# Patient Record
Sex: Female | Born: 1975 | Race: White | Hispanic: No | Smoking: Current some day smoker
Health system: Southern US, Community
[De-identification: ages and names within clinical notes are randomized; demographics above are authoritative.]

## PROBLEM LIST (undated history)

## (undated) ENCOUNTER — Emergency Department (HOSPITAL_COMMUNITY): Payer: Medicaid Other | Source: Home / Self Care

## (undated) ENCOUNTER — Ambulatory Visit: Discharge status: Absent without leave | Payer: Managed Care, Other (non HMO)

## (undated) DIAGNOSIS — E119 Type 2 diabetes mellitus without complications: Secondary | ICD-10-CM

## (undated) DIAGNOSIS — J45909 Unspecified asthma, uncomplicated: Secondary | ICD-10-CM

## (undated) DIAGNOSIS — G43909 Migraine, unspecified, not intractable, without status migrainosus: Secondary | ICD-10-CM

## (undated) DIAGNOSIS — I456 Pre-excitation syndrome: Secondary | ICD-10-CM

## (undated) HISTORY — PX: ABDOMINAL HYSTERECTOMY: SHX81

## (undated) HISTORY — PX: CARDIAC SURGERY: SHX584

---

## 2016-02-03 ENCOUNTER — Emergency Department (HOSPITAL_COMMUNITY)
Admission: EM | Admit: 2016-02-03 | Discharge: 2016-02-03 | Disposition: A | Discharge status: Home / Self Care | Payer: Self-pay | Attending: Emergency Medicine | Admitting: Emergency Medicine

## 2016-02-03 ENCOUNTER — Emergency Department (HOSPITAL_COMMUNITY)
Admission: EM | Admit: 2016-02-03 | Discharge: 2016-02-04 | Disposition: A | Discharge status: Home / Self Care | Payer: Self-pay | Attending: Emergency Medicine | Admitting: Emergency Medicine

## 2016-02-03 ENCOUNTER — Encounter (HOSPITAL_COMMUNITY): Payer: Self-pay | Admitting: Emergency Medicine

## 2016-02-03 ENCOUNTER — Encounter (HOSPITAL_COMMUNITY): Payer: Self-pay | Admitting: Family Medicine

## 2016-02-03 DIAGNOSIS — Z9889 Other specified postprocedural states: Secondary | ICD-10-CM | POA: Insufficient documentation

## 2016-02-03 DIAGNOSIS — Z8679 Personal history of other diseases of the circulatory system: Secondary | ICD-10-CM | POA: Insufficient documentation

## 2016-02-03 DIAGNOSIS — Z90710 Acquired absence of both cervix and uterus: Secondary | ICD-10-CM | POA: Insufficient documentation

## 2016-02-03 DIAGNOSIS — K625 Hemorrhage of anus and rectum: Secondary | ICD-10-CM | POA: Insufficient documentation

## 2016-02-03 DIAGNOSIS — R1013 Epigastric pain: Secondary | ICD-10-CM | POA: Insufficient documentation

## 2016-02-03 HISTORY — DX: Pre-excitation syndrome: I45.6

## 2016-02-03 LAB — COMPREHENSIVE METABOLIC PANEL
ALBUMIN: 4.4 g/dL (ref 3.5–5.0)
ALK PHOS: 71 U/L (ref 38–126)
ALT: 18 U/L (ref 14–54)
ANION GAP: 10 (ref 5–15)
AST: 20 U/L (ref 15–41)
BILIRUBIN TOTAL: 0.6 mg/dL (ref 0.3–1.2)
BUN: 16 mg/dL (ref 6–20)
CALCIUM: 9.7 mg/dL (ref 8.9–10.3)
CO2: 23 mmol/L (ref 22–32)
CREATININE: 0.94 mg/dL (ref 0.44–1.00)
Chloride: 107 mmol/L (ref 101–111)
GFR calc non Af Amer: >60 mL/min (ref >60)
GLUCOSE: 109 mg/dL — AB (ref 65–99)
Potassium: 4.1 mmol/L (ref 3.5–5.1)
Sodium: 140 mmol/L (ref 135–145)
TOTAL PROTEIN: 7.7 g/dL (ref 6.5–8.1)

## 2016-02-03 LAB — CBC
HCT: 38 % (ref 36.0–46.0)
HEMATOCRIT: 38.2 % (ref 36.0–46.0)
HEMOGLOBIN: 12.1 g/dL (ref 12.0–15.0)
Hemoglobin: 12.9 g/dL (ref 12.0–15.0)
MCH: 27.9 pg (ref 26.0–34.0)
MCH: 28.9 pg (ref 26.0–34.0)
MCHC: 31.8 g/dL (ref 30.0–36.0)
MCHC: 33.8 g/dL (ref 30.0–36.0)
MCV: 87.8 fL (ref 78.0–100.0)
MCV: 85.7 fL (ref 78.0–100.0)
PLATELETS: 355 10*3/uL (ref 150–400)
PLATELETS: 400 10*3/uL (ref 150–400)
RBC: 4.33 MIL/uL (ref 3.87–5.11)
RBC: 4.46 MIL/uL (ref 3.87–5.11)
RDW: 13.9 % (ref 11.5–15.5)
RDW: 13.9 % (ref 11.5–15.5)
WBC: 8.4 10*3/uL (ref 4.0–10.5)
WBC: 9.7 10*3/uL (ref 4.0–10.5)

## 2016-02-03 LAB — URINALYSIS, ROUTINE W REFLEX MICROSCOPIC
BILIRUBIN URINE: NEGATIVE
GLUCOSE, UA: NEGATIVE mg/dL
HGB URINE DIPSTICK: NEGATIVE
KETONES UR: NEGATIVE mg/dL
Leukocytes, UA: NEGATIVE
Nitrite: NEGATIVE
PROTEIN: NEGATIVE mg/dL
Specific Gravity, Urine: 1.023 (ref 1.005–1.030)
pH: 5 (ref 5.0–8.0)

## 2016-02-03 LAB — POC OCCULT BLOOD, ED: FECAL OCCULT BLD: NEGATIVE

## 2016-02-03 LAB — LIPASE, BLOOD: LIPASE: 30 U/L (ref 11–51)

## 2016-02-03 MED ORDER — MORPHINE SULFATE (PF) 4 MG/ML IV SOLN
4.0000 mg | Freq: Once | INTRAVENOUS | Status: AC
  Administered 2016-02-03: 4 mg via INTRAVENOUS
  Filled 2016-02-03: qty 1

## 2016-02-03 MED ORDER — PANTOPRAZOLE SODIUM 40 MG IV SOLR
40.0000 mg | Freq: Once | INTRAVENOUS | Status: AC
  Administered 2016-02-03: 40 mg via INTRAVENOUS
  Filled 2016-02-03: qty 40

## 2016-02-03 MED ORDER — GI COCKTAIL ~~LOC~~
30.0000 mL | Freq: Once | ORAL | Status: AC
  Administered 2016-02-03: 30 mL via ORAL
  Filled 2016-02-03: qty 30

## 2016-02-03 MED ORDER — HYDROCORTISONE 2.5 % RE CREA: TOPICAL_CREAM | RECTAL | Status: DC

## 2016-02-03 MED ORDER — SODIUM CHLORIDE 0.9 % IV BOLUS (SEPSIS)
1000.0000 mL | Freq: Once | INTRAVENOUS | Status: AC
  Administered 2016-02-03: 1000 mL via INTRAVENOUS

## 2016-02-03 NOTE — Discharge Instructions (Signed)
Michelle Gibson,  Nice meeting you! Please follow-up with gastroenterology. Return to the emergency department if you develop increased abdominal pain, rectal bleeding, dizziness, chest pain, shortness of breath, feel weak, or have new/worsening symptoms. Feel better soon!  S. Wendie Simmer, PA-C Gastrointestinal Bleeding Gastrointestinal (GI) bleeding means there is bleeding somewhere along the digestive tract, between the mouth and anus. CAUSES  There are many different problems that can cause GI bleeding. Possible causes include:  Esophagitis. This is inflammation, irritation, or swelling of the esophagus.  Hemorrhoids.These are veins that are full of blood (engorged) in the rectum. They cause pain, inflammation, and may bleed.  Anal fissures.These are areas of painful tearing which may bleed. They are often caused by passing hard stool.  Diverticulosis.These are pouches that form on the colon over time, with age, and may bleed significantly.  Diverticulitis.This is inflammation in areas with diverticulosis. It can cause pain, fever, and bloody stools, although bleeding is rare.  Polyps and cancer. Colon cancer often starts out as precancerous polyps.  Gastritis and ulcers.Bleeding from the upper gastrointestinal tract (near the stomach) may travel through the intestines and produce black, sometimes tarry, often bad smelling stools. In certain cases, if the bleeding is fast enough, the stools may not be black, but red. This condition may be life-threatening. SYMPTOMS   Vomiting bright red blood or material that looks like coffee grounds.  Bloody, black, or tarry stools. DIAGNOSIS  Your caregiver may diagnose your condition by taking your history and performing a physical exam. More tests may be needed, including:  X-rays and other imaging tests.  Esophagogastroduodenoscopy (EGD). This test uses a flexible, lighted tube to look at your esophagus, stomach, and small  intestine.  Colonoscopy. This test uses a flexible, lighted tube to look at your colon. TREATMENT  Treatment depends on the cause of your bleeding.   For bleeding from the esophagus, stomach, small intestine, or colon, the caregiver doing your EGD or colonoscopy may be able to stop the bleeding as part of the procedure.  Inflammation or infection of the colon can be treated with medicines.  Many rectal problems can be treated with creams, suppositories, or warm baths.  Surgery is sometimes needed.  Blood transfusions are sometimes needed if you have lost a lot of blood. If bleeding is slow, you may be allowed to go home. If there is a lot of bleeding, you will need to stay in the hospital for observation. HOME CARE INSTRUCTIONS   Take any medicines exactly as prescribed.  Keep your stools soft by eating foods that are high in fiber. These foods include whole grains, legumes, fruits, and vegetables. Prunes (1 to 3 a day) work well for many people.  Drink enough fluids to keep your urine clear or pale yellow. SEEK IMMEDIATE MEDICAL CARE IF:   Your bleeding increases.  You feel lightheaded, weak, or you faint.  You have severe cramps in your back or abdomen.  You pass large blood clots in your stool.  Your problems are getting worse. MAKE SURE YOU:   Understand these instructions.  Will watch your condition.  Will get help right away if you are not doing well or get worse.   This information is not intended to replace advice given to you by your health care provider. Make sure you discuss any questions you have with your health care provider.   Document Released: 09/24/2000 Document Revised: 09/13/2012 Document Reviewed: 03/17/2015 Elsevier Interactive Patient Education [employer].

## 2016-02-03 NOTE — ED Notes (Signed)
Pt here for LUQ pain and rectal bleeding that started today. Denies N,V.

## 2016-02-03 NOTE — ED Provider Notes (Signed)
CSN: [id number]     Arrival date & time 02/03/16  1556 History   First MD Initiated Contact with Patient 02/03/16 2025     Chief Complaint  Patient presents with  . Rectal Bleeding   HPI   Michelle Gibson is a 40 y.o. female PMH significant for WPW presenting with one episode of hematochezia and abdominal pain. She describes her hematochezia as BRB, light brown stool, no clots. She endorses epigastric pain which she describes as 10 out of 10 pain scale, nonradiating, constant, not associated with bowel movements or eating habits. She denies fevers, chills, CP, SOB, weakness, headaches, dizziness, N/V.   Past Medical History  Diagnosis Date  . WPW (Wolff-Parkinson-White syndrome)    Past Surgical History  Procedure Laterality Date  . Abdominal hysterectomy    . Cesarean section    . Cardiac surgery     No family history on file. Social History  Substance Use Topics  . Smoking status: Never Smoker   . Smokeless tobacco: None  . Alcohol Use: No   OB History    No data available     Review of Systems  Ten systems are reviewed and are negative for acute change except as noted in the HPI  Allergies  Ciprofloxacin  Home Medications   Prior to Admission medications   Medication Sig Start Date End Date Taking? Authorizing Provider  naproxen (NAPROSYN) 250 MG tablet Take 250 mg by mouth daily as needed for moderate pain.   Yes Historical Provider, MD   BP 126/86 mmHg  Pulse 80  Temp(Src) 98.7 F (37.1 C) (Oral)  Resp 18  SpO2 98% Physical Exam  Constitutional: She appears well-developed and well-nourished. No distress.  HENT:  Head: Normocephalic and atraumatic.  Mouth/Throat: Oropharynx is clear and moist. No oropharyngeal exudate.  Eyes: Conjunctivae are normal. Pupils are equal, round, and reactive to light. Right eye exhibits no discharge. Left eye exhibits no discharge. No scleral icterus.  Neck: No tracheal deviation present.  Cardiovascular: Normal rate, regular  rhythm, normal heart sounds and intact distal pulses.  Exam reveals no gallop and no friction rub.   No murmur heard. Pulmonary/Chest: Effort normal and breath sounds normal. No respiratory distress. She has no wheezes. She has no rales. She exhibits no tenderness.  Abdominal: Soft. Bowel sounds are normal. She exhibits no distension and no mass. There is tenderness. There is no rebound and no guarding.  Minimal, distractible epigastric tenderness  Genitourinary:  Chaperoned digital rectal exam: negative without frank blood, mass, lesions or tenderness.   Musculoskeletal: She exhibits no edema.  Lymphadenopathy:    She has no cervical adenopathy.  Neurological: She is alert. Coordination normal.  Skin: Skin is warm and dry. No rash noted. She is not diaphoretic. No erythema.  Psychiatric: She has a normal mood and affect. Her behavior is normal.  Nursing note and vitals reviewed.   ED Course  Procedures  Labs Review Labs Reviewed  CBC  POC OCCULT BLOOD, ED   MDM   Final diagnoses:  Rectal bleeding  Epigastric pain   Patient non-toxic appearing and VSS. One episode of hematochezia today. Negative rectal and hemoccult. Based on patient history and physical exam, most likely etiologies are GERD. Less likely etiologies include PUD, pancreatitis, MI, diverticulitis.  Hgb, which was stable earlier today, has even improved.   Medications  sodium chloride 0.9 % bolus 1,000 mL (0 mLs Intravenous Stopped 02/03/16 2232)  morphine 4 MG/ML injection 4 mg (4 mg Intravenous Given 02/03/16  2232)  pantoprazole (PROTONIX) injection 40 mg (40 mg Intravenous Given 02/03/16 2232)  gi cocktail (Maalox,Lidocaine,Donnatal) (30 mLs Oral Given 02/03/16 2308)   Patient feels improved after observation and/or treatment in ED.  Patient may be safely discharged home with anusol.  Discussed reasons for return. Patient to follow-up with primary care provider within one week. Patient in understanding and  agreement with the plan.  Rogers Lions, PA-C 02/09/16 [id number]  Wandra Arthurs, MD 02/09/16 1254

## 2016-02-03 NOTE — ED Notes (Signed)
CBC collected by Sheffield Slider RN not Livia Snellen RN as previously charted in error.

## 2016-02-03 NOTE — ED Notes (Signed)
Pt has urine sample at triage nursing station.

## 2016-02-03 NOTE — ED Notes (Signed)
Pt advised secretary she was leaving.  Seen walking out.

## 2016-02-03 NOTE — ED Notes (Addendum)
Pt reports bright red rectal bleed started today, sts noticed bright red blood while passing BM , sts normal color stools. Reports blood was even through her pants. Reports LUQ pain . denies nausea nor emesis. denies vaginal bleeding and urinary symptoms yet reports  Hx of IC .   Pt was at [facility] , left after triage, blood and urine tests were done per pt.

## 2016-02-03 NOTE — ED Notes (Signed)
I ATTEMPTED TWICE TO COLLECT BLOOD AND WAS UNSUCCESSFUL

## 2016-02-03 NOTE — ED Notes (Signed)
Patient stated she was leaving. 

## 2016-02-03 NOTE — ED Notes (Signed)
Unsuccessful attempt to draw blood. Nurse informed.

## 2016-02-03 NOTE — ED Notes (Signed)
Pt able to ambulate to the restroom without assistance.

## 2016-09-10 ENCOUNTER — Encounter (HOSPITAL_COMMUNITY): Payer: Self-pay | Admitting: Emergency Medicine

## 2016-09-10 ENCOUNTER — Emergency Department (HOSPITAL_COMMUNITY): Payer: Self-pay

## 2016-09-10 ENCOUNTER — Inpatient Hospital Stay (HOSPITAL_COMMUNITY)
Admission: EM | Admit: 2016-09-10 | Discharge: 2016-09-19 | DRG: 202 | Disposition: A | Discharge status: Home / Self Care | Payer: Self-pay | Attending: Internal Medicine | Admitting: Internal Medicine

## 2016-09-10 DIAGNOSIS — R0982 Postnasal drip: Secondary | ICD-10-CM | POA: Diagnosis present

## 2016-09-10 DIAGNOSIS — B974 Respiratory syncytial virus as the cause of diseases classified elsewhere: Secondary | ICD-10-CM | POA: Diagnosis present

## 2016-09-10 DIAGNOSIS — K219 Gastro-esophageal reflux disease without esophagitis: Secondary | ICD-10-CM | POA: Diagnosis present

## 2016-09-10 DIAGNOSIS — Z8679 Personal history of other diseases of the circulatory system: Secondary | ICD-10-CM

## 2016-09-10 DIAGNOSIS — R739 Hyperglycemia, unspecified: Secondary | ICD-10-CM | POA: Diagnosis present

## 2016-09-10 DIAGNOSIS — Z23 Encounter for immunization: Secondary | ICD-10-CM

## 2016-09-10 DIAGNOSIS — R0600 Dyspnea, unspecified: Secondary | ICD-10-CM

## 2016-09-10 DIAGNOSIS — J40 Bronchitis, not specified as acute or chronic: Secondary | ICD-10-CM

## 2016-09-10 DIAGNOSIS — J45901 Unspecified asthma with (acute) exacerbation: Principal | ICD-10-CM | POA: Diagnosis present

## 2016-09-10 DIAGNOSIS — B9789 Other viral agents as the cause of diseases classified elsewhere: Secondary | ICD-10-CM | POA: Diagnosis present

## 2016-09-10 DIAGNOSIS — T380X5A Adverse effect of glucocorticoids and synthetic analogues, initial encounter: Secondary | ICD-10-CM | POA: Diagnosis present

## 2016-09-10 DIAGNOSIS — J9801 Acute bronchospasm: Secondary | ICD-10-CM | POA: Diagnosis present

## 2016-09-10 DIAGNOSIS — J9601 Acute respiratory failure with hypoxia: Secondary | ICD-10-CM | POA: Diagnosis present

## 2016-09-10 HISTORY — DX: Unspecified asthma, uncomplicated: J45.909

## 2016-09-10 MED ORDER — IPRATROPIUM-ALBUTEROL 0.5-2.5 (3) MG/3ML IN SOLN
3.0000 mL | Freq: Once | RESPIRATORY_TRACT | Status: AC
  Administered 2016-09-10: 3 mL via RESPIRATORY_TRACT

## 2016-09-10 MED ORDER — HYDROCOD POLST-CPM POLST ER 10-8 MG/5ML PO SUER
ORAL | Status: AC
  Filled 2016-09-10: qty 5

## 2016-09-10 MED ORDER — HYDROCOD POLST-CPM POLST ER 10-8 MG/5ML PO SUER
5.0000 mL | Freq: Once | ORAL | Status: AC
  Administered 2016-09-10: 5 mL via ORAL

## 2016-09-10 MED ORDER — ALBUTEROL SULFATE HFA 108 (90 BASE) MCG/ACT IN AERS
INHALATION_SPRAY | RESPIRATORY_TRACT | Status: AC
  Filled 2016-09-10: qty 6.7

## 2016-09-10 MED ORDER — PREDNISONE 20 MG PO TABS
ORAL_TABLET | ORAL | Status: AC
  Filled 2016-09-10: qty 4

## 2016-09-10 MED ORDER — PREDNISONE 20 MG PO TABS
60.0000 mg | ORAL_TABLET | Freq: Once | ORAL | Status: AC
  Administered 2016-09-10: 60 mg via ORAL

## 2016-09-10 MED ORDER — ALBUTEROL SULFATE (2.5 MG/3ML) 0.083% IN NEBU
INHALATION_SOLUTION | RESPIRATORY_TRACT | Status: AC
  Filled 2016-09-10: qty 6

## 2016-09-10 MED ORDER — IPRATROPIUM-ALBUTEROL 0.5-2.5 (3) MG/3ML IN SOLN
RESPIRATORY_TRACT | Status: AC
  Filled 2016-09-10: qty 3

## 2016-09-10 MED ORDER — ALBUTEROL SULFATE HFA 108 (90 BASE) MCG/ACT IN AERS
2.0000 | INHALATION_SPRAY | RESPIRATORY_TRACT | Status: DC | PRN
  Administered 2016-09-10: 2 via RESPIRATORY_TRACT

## 2016-09-10 MED ORDER — ALBUTEROL SULFATE (2.5 MG/3ML) 0.083% IN NEBU
5.0000 mg | INHALATION_SOLUTION | Freq: Once | RESPIRATORY_TRACT | Status: AC
  Administered 2016-09-10: 5 mg via RESPIRATORY_TRACT

## 2016-09-10 NOTE — ED Provider Notes (Signed)
[facility] DEPT Provider Note   CSN: [id number] Arrival date & time: 09/10/16  2134  By signing my name below, I, Hansel Feinstein, attest that this documentation has been prepared under the direction and in the presence of  [facility] M. Janit Bern, NP. Electronically Signed: Hansel Feinstein, ED Scribe. 09/10/16. 10:35 PM.    History   Chief Complaint Chief Complaint  Patient presents with  . Cough    HPI Michelle Gibson is a 40 y.o. female who presents to the Emergency Department complaining of intermittent, worsening dry cough onset 3 days ago with associated wheezing, SOB, generalized fatigue. Pt states she has been taking Dayquil with no relief of symptoms. No worsening factors noted. Pt has h/o asthma as a child and h/o bronchitis, noting that these symptoms are similar. Pt states she has seen relief of symptoms in the ED after receiving a breathing treatment. She denies fever.   The history is provided by the patient. No language interpreter was used.  Cough  This is a new problem. The current episode started more than 2 days ago. The problem occurs every few minutes. The problem has been gradually worsening. The cough is non-productive. There has been no fever. Associated symptoms include shortness of breath and wheezing. She has tried decongestants for the symptoms. The treatment provided no relief. She is not a smoker. Her past medical history is significant for bronchitis and asthma.    Past Medical History:  Diagnosis Date  . WPW (Wolff-Parkinson-White syndrome)     There are no active problems to display for this patient.   Past Surgical History:  Procedure Laterality Date  . ABDOMINAL HYSTERECTOMY    . CARDIAC SURGERY    . CESAREAN SECTION      OB History    No data available       Home Medications    Prior to Admission medications   Medication Sig Start Date End Date Taking? Authorizing Provider  azithromycin (ZITHROMAX) 250 MG tablet Take 1 tablet (250 mg total) by mouth  daily. Take first 2 tablets together, then 1 every day until finished. 09/11/16   Guiseppe Flanagan Bunnie Pion, NP  guaiFENesin-codeine (ROBITUSSIN AC) 100-10 MG/5ML syrup Take 5 mLs by mouth 3 (three) times daily as needed for cough. 09/11/16   Caira Poche Bunnie Pion, NP  hydrocortisone (ANUSOL-HC) 2.5 % rectal cream Apply rectally 2 times daily 02/03/16   [place] Lions, PA-C  naproxen (NAPROSYN) 250 MG tablet Take 250 mg by mouth daily as needed for moderate pain.    Historical Provider, MD  predniSONE (DELTASONE) 10 MG tablet Take 2 tablets (20 mg total) by mouth 2 (two) times daily with a meal. 09/11/16   Dorothy Landgrebe Bunnie Pion, NP    Family History No family history on file.  Social History Social History  Substance Use Topics  . Smoking status: Never Smoker  . Smokeless tobacco: Not on file  . Alcohol use No     Allergies   Ciprofloxacin   Review of Systems Review of Systems  Constitutional: Positive for fatigue.  Respiratory: Positive for cough, shortness of breath and wheezing.   All other systems reviewed and are negative.    Physical Exam Updated Vital Signs BP 142/80 (BP Location: Left Arm)   Pulse (S) (!) 135 Comment: while ambulating  Temp 98.3 F (36.8 C) (Oral)   Resp 18   SpO2 (S) (!) 88% Comment: While ambulating  Physical Exam  Constitutional: She appears well-developed and well-nourished.  HENT:  Head: Normocephalic.  Uvula midline. Mild erythema. No edema. TMs normal bilaterally.   Eyes: Conjunctivae are normal.  Neck: Normal range of motion. Neck supple.  Cardiovascular: Normal rate and regular rhythm.   Pulmonary/Chest: Effort normal. No respiratory distress. She has wheezes.  Inspiratory and expiratory wheezes. Positive rhonchi.   Abdominal: Soft. She exhibits no distension. There is no tenderness.  Musculoskeletal: Normal range of motion.  Lymphadenopathy:    She has no cervical adenopathy.  Neurological: She is alert.  Skin: Skin is warm and dry.  Psychiatric: She has  a normal mood and affect. Her behavior is normal.  Nursing note and vitals reviewed.    ED Treatments / Results   DIAGNOSTIC STUDIES: COORDINATION OF CARE: 10:33 PM Discussed treatment plan with pt at bedside which includes CXR, breathing tx and pt agreed to plan.    Labs (all labs ordered are listed, but only abnormal results are displayed) Labs Reviewed  CBC WITH DIFFERENTIAL/PLATELET - Abnormal; Notable for the following:       Result Value   Neutro Abs 8.9 (*)    All other components within normal limits  BASIC METABOLIC PANEL - Abnormal; Notable for the following:    Potassium 3.2 (*)    CO2 21 (*)    Glucose, Bld 166 (*)    Creatinine, Ser 1.14 (*)    All other components within normal limits  D-DIMER, QUANTITATIVE (NOT AT [facility])    Radiology Dg Chest 2 View  Result Date: 09/10/2016 CLINICAL DATA:  Acute onset of wheezing and scratchy throat. Shortness of breath and generalized chest discomfort. Initial encounter. EXAM: CHEST  2 VIEW COMPARISON:  None. FINDINGS: The lungs are well-aerated and clear. There is no evidence of focal opacification, pleural effusion or pneumothorax. The heart is normal in size; the mediastinal contour is within normal limits. The patient is status post median sternotomy. Clips are noted within the right upper quadrant, reflecting prior cholecystectomy. No acute osseous abnormalities are seen. IMPRESSION: No acute cardiopulmonary process seen. Electronically Signed   By: Garald Balding M.D.   On: 09/10/2016 22:27    Procedures Procedures (including critical care time)  Medications Ordered in ED Medications  albuterol (PROVENTIL HFA;VENTOLIN HFA) 108 (90 Base) MCG/ACT inhaler 2 puff (2 puffs Inhalation Given 09/10/16 2332)  albuterol (PROVENTIL) (2.5 MG/3ML) 0.083% nebulizer solution 5 mg (5 mg Nebulization Given 09/10/16 2150)  ipratropium-albuterol (DUONEB) 0.5-2.5 (3) MG/3ML nebulizer solution 3 mL (3 mLs Nebulization Given 09/10/16 2251)    predniSONE (DELTASONE) tablet 60 mg (60 mg Oral Given 09/10/16 2251)  chlorpheniramine-HYDROcodone (TUSSIONEX) 10-8 MG/5ML suspension 5 mL (5 mLs Oral Given 09/10/16 2332)  ipratropium-albuterol (DUONEB) 0.5-2.5 (3) MG/3ML nebulizer solution 3 mL (3 mLs Nebulization Given 09/11/16 0009)     Initial Impression / Assessment and Plan / ED Course  I have reviewed the triage vital signs and the nursing notes.  Pertinent imaging results that were available during my care of the patient were reviewed by me and considered in my medical decision making (see chart for details).  Clinical Course      Lung exam improved after nebulizer treatment but continues to feel short of breath Prednisone given in the ED, Tussionex 5 ml PO given.  Second neb treatment given and patient continues to have diminished breath sounds but only occasional wheezing. When ambulating the patient O2 sat drops below 90.  Third neb treatment given and patient ambulated after.  O2 SAT continued to drop below 90 and patient is tachycardic.   Care turned  over to Charlann Lange, [facility] @ 2:15am If patient remains tachycardic and O2 SAT drops below 90 with ambulation will plan to admit.   Final Clinical Impressions(s) / ED Diagnoses   I personally performed the services described in this documentation, which was scribed in my presence. The recorded information has been reviewed and is accurate.    [address] [place], NP 09/11/16 [id number]    Gareth Morgan, MD 09/11/16 [phone]

## 2016-09-10 NOTE — ED Triage Notes (Signed)
Pt c/o scratchy throat and cough, wheezing, fatigue. Pt lung sounds are wheezing throughout. Pt taking dayquil without relief. Hx of asthma as a child.

## 2016-09-11 ENCOUNTER — Emergency Department (HOSPITAL_COMMUNITY): Payer: Self-pay

## 2016-09-11 ENCOUNTER — Encounter (HOSPITAL_COMMUNITY): Payer: Self-pay | Admitting: Physician Assistant

## 2016-09-11 DIAGNOSIS — J4521 Mild intermittent asthma with (acute) exacerbation: Secondary | ICD-10-CM

## 2016-09-11 DIAGNOSIS — J9801 Acute bronchospasm: Secondary | ICD-10-CM | POA: Diagnosis present

## 2016-09-11 LAB — BASIC METABOLIC PANEL
ANION GAP: 14 (ref 5–15)
BUN: 17 mg/dL (ref 6–20)
CO2: 21 mmol/L — ABNORMAL LOW (ref 22–32)
Calcium: 9.5 mg/dL (ref 8.9–10.3)
Chloride: 104 mmol/L (ref 101–111)
Creatinine, Ser: 1.14 mg/dL — ABNORMAL HIGH (ref 0.44–1.00)
GLUCOSE: 166 mg/dL — AB (ref 65–99)
POTASSIUM: 3.2 mmol/L — AB (ref 3.5–5.1)
Sodium: 139 mmol/L (ref 135–145)

## 2016-09-11 LAB — CBC WITH DIFFERENTIAL/PLATELET
BASOS ABS: 0 10*3/uL (ref 0.0–0.1)
BASOS PCT: 0 %
EOS PCT: 1 %
Eosinophils Absolute: 0.1 10*3/uL (ref 0.0–0.7)
HEMATOCRIT: 42.9 % (ref 36.0–46.0)
Hemoglobin: 14.5 g/dL (ref 12.0–15.0)
LYMPHS PCT: 10 %
Lymphs Abs: 1.1 10*3/uL (ref 0.7–4.0)
MCH: 30.5 pg (ref 26.0–34.0)
MCHC: 33.8 g/dL (ref 30.0–36.0)
MCV: 90.3 fL (ref 78.0–100.0)
Monocytes Absolute: 0.3 10*3/uL (ref 0.1–1.0)
Monocytes Relative: 3 %
NEUTROS ABS: 8.9 10*3/uL — AB (ref 1.7–7.7)
Neutrophils Relative %: 86 %
PLATELETS: 360 10*3/uL (ref 150–400)
RBC: 4.75 MIL/uL (ref 3.87–5.11)
RDW: 13.9 % (ref 11.5–15.5)
WBC: 10.4 10*3/uL (ref 4.0–10.5)

## 2016-09-11 LAB — D-DIMER, QUANTITATIVE (NOT AT ARMC)

## 2016-09-11 LAB — TROPONIN I

## 2016-09-11 MED ORDER — HYDROCODONE-ACETAMINOPHEN 5-325 MG PO TABS
1.0000 | ORAL_TABLET | ORAL | Status: DC | PRN
  Administered 2016-09-11 – 2016-09-12 (×7): 2 via ORAL
  Filled 2016-09-11 (×7): qty 2

## 2016-09-11 MED ORDER — AZITHROMYCIN 250 MG PO TABS: 250.0000 mg | ORAL_TABLET | Freq: Every day | ORAL | 0 refills | Status: DC

## 2016-09-11 MED ORDER — IPRATROPIUM-ALBUTEROL 0.5-2.5 (3) MG/3ML IN SOLN
3.0000 mL | RESPIRATORY_TRACT | Status: DC
  Administered 2016-09-11 – 2016-09-12 (×7): 3 mL via RESPIRATORY_TRACT
  Filled 2016-09-11 (×6): qty 3

## 2016-09-11 MED ORDER — GUAIFENESIN-CODEINE 100-10 MG/5ML PO SYRP: 5.0000 mL | ORAL_SOLUTION | Freq: Three times a day (TID) | ORAL | 0 refills | Status: DC | PRN

## 2016-09-11 MED ORDER — IPRATROPIUM-ALBUTEROL 0.5-2.5 (3) MG/3ML IN SOLN
3.0000 mL | Freq: Once | RESPIRATORY_TRACT | Status: AC
  Administered 2016-09-11: 3 mL via RESPIRATORY_TRACT

## 2016-09-11 MED ORDER — IPRATROPIUM-ALBUTEROL 0.5-2.5 (3) MG/3ML IN SOLN
RESPIRATORY_TRACT | Status: AC
  Filled 2016-09-11: qty 3

## 2016-09-11 MED ORDER — SODIUM CHLORIDE 0.9% FLUSH
3.0000 mL | Freq: Two times a day (BID) | INTRAVENOUS | Status: DC
  Administered 2016-09-11 – 2016-09-18 (×16): 3 mL via INTRAVENOUS

## 2016-09-11 MED ORDER — GUAIFENESIN ER 600 MG PO TB12
600.0000 mg | ORAL_TABLET | Freq: Two times a day (BID) | ORAL | Status: DC
  Administered 2016-09-11 – 2016-09-12 (×3): 600 mg via ORAL
  Filled 2016-09-11 (×4): qty 1

## 2016-09-11 MED ORDER — MAGNESIUM CITRATE PO SOLN: 1.0000 | Freq: Once | ORAL | Status: DC | PRN

## 2016-09-11 MED ORDER — ACETAMINOPHEN 325 MG PO TABS
650.0000 mg | ORAL_TABLET | Freq: Four times a day (QID) | ORAL | Status: DC | PRN
  Administered 2016-09-11 – 2016-09-15 (×3): 650 mg via ORAL
  Filled 2016-09-11 (×2): qty 2

## 2016-09-11 MED ORDER — ALBUTEROL SULFATE (2.5 MG/3ML) 0.083% IN NEBU
5.0000 mg | INHALATION_SOLUTION | RESPIRATORY_TRACT | Status: DC | PRN
  Administered 2016-09-13 – 2016-09-15 (×3): 5 mg via RESPIRATORY_TRACT
  Filled 2016-09-11 (×3): qty 6

## 2016-09-11 MED ORDER — ENOXAPARIN SODIUM 40 MG/0.4ML ~~LOC~~ SOLN
40.0000 mg | SUBCUTANEOUS | Status: DC
  Administered 2016-09-11 – 2016-09-18 (×8): 40 mg via SUBCUTANEOUS
  Filled 2016-09-11 (×8): qty 0.4

## 2016-09-11 MED ORDER — SENNOSIDES-DOCUSATE SODIUM 8.6-50 MG PO TABS
1.0000 | ORAL_TABLET | Freq: Every evening | ORAL | Status: DC | PRN
  Filled 2016-09-11: qty 1

## 2016-09-11 MED ORDER — IOPAMIDOL (ISOVUE-370) INJECTION 76%
INTRAVENOUS | Status: AC
  Administered 2016-09-11: 100 mL
  Filled 2016-09-11: qty 100

## 2016-09-11 MED ORDER — ALBUTEROL (5 MG/ML) CONTINUOUS INHALATION SOLN
10.0000 mg/h | INHALATION_SOLUTION | Freq: Once | RESPIRATORY_TRACT | Status: AC
  Administered 2016-09-11: 10 mg/h via RESPIRATORY_TRACT

## 2016-09-11 MED ORDER — TRAZODONE HCL 50 MG PO TABS
25.0000 mg | ORAL_TABLET | Freq: Every evening | ORAL | Status: DC | PRN
  Filled 2016-09-11: qty 1

## 2016-09-11 MED ORDER — ACETAMINOPHEN 325 MG PO TABS
ORAL_TABLET | ORAL | Status: AC
  Filled 2016-09-11: qty 2

## 2016-09-11 MED ORDER — INFLUENZA VAC SPLIT QUAD 0.5 ML IM SUSY
0.5000 mL | PREFILLED_SYRINGE | INTRAMUSCULAR | Status: DC
  Filled 2016-09-11: qty 0.5

## 2016-09-11 MED ORDER — METHYLPREDNISOLONE SODIUM SUCC 125 MG IJ SOLR
INTRAMUSCULAR | Status: AC
  Filled 2016-09-11: qty 2

## 2016-09-11 MED ORDER — BISACODYL 10 MG RE SUPP: 10.0000 mg | Freq: Every day | RECTAL | Status: DC | PRN

## 2016-09-11 MED ORDER — PREDNISONE 10 MG PO TABS: 20.0000 mg | ORAL_TABLET | Freq: Two times a day (BID) | ORAL | 0 refills | Status: DC

## 2016-09-11 MED ORDER — METHYLPREDNISOLONE SODIUM SUCC 125 MG IJ SOLR
60.0000 mg | Freq: Four times a day (QID) | INTRAMUSCULAR | Status: DC
  Administered 2016-09-11 – 2016-09-15 (×17): 60 mg via INTRAVENOUS
  Filled 2016-09-11 (×16): qty 2

## 2016-09-11 MED ORDER — ONDANSETRON HCL 4 MG PO TABS
4.0000 mg | ORAL_TABLET | Freq: Four times a day (QID) | ORAL | Status: DC | PRN
  Administered 2016-09-12: 4 mg via ORAL
  Filled 2016-09-11: qty 1

## 2016-09-11 MED ORDER — ACETAMINOPHEN 650 MG RE SUPP: 650.0000 mg | Freq: Four times a day (QID) | RECTAL | Status: DC | PRN

## 2016-09-11 MED ORDER — ONDANSETRON HCL 4 MG/2ML IJ SOLN
4.0000 mg | Freq: Four times a day (QID) | INTRAMUSCULAR | Status: DC | PRN
  Administered 2016-09-12: 4 mg via INTRAVENOUS
  Filled 2016-09-11: qty 2

## 2016-09-11 MED ORDER — IPRATROPIUM BROMIDE 0.02 % IN SOLN
0.5000 mg | Freq: Once | RESPIRATORY_TRACT | Status: AC
  Administered 2016-09-11: 0.5 mg via RESPIRATORY_TRACT

## 2016-09-11 NOTE — H&P (Signed)
History and Physical    Michelle Gibson [id number] DOB: [birth year] DOA: 09/10/2016   PCP: No PCP Per Patient   Patient coming from:  Home    Chief Complaint: Asthma exacerbation   HPI: Michelle Gibson is a 40 y.o. female with medical history significant for childhood asthma, Wolf Parkinson's white, presenting with 3 day history of worsening cough and wheezing without fever. She also reports generalized fatigue. The patient has been taking Dayquil will with no relief of symptoms. She reports that the symptoms of her asthma exacerbation are similar to prior. 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 She was satting in the 80s on room air on presentation, but now these above 90 in RA . Denies fever or chills, myalgias or night sweats. Denies any sick contacts. No recent long distance travel. She denies any new stressors. No nausea vomiting diarrhea, abdominal pain, or leg swelling. No bleeding issues are noted.  Test x-ray negative. CT angiogram negative for PE. NO tobacco or recreational drugs  She is being admitted for asthma exacerbation.  ED Course:  BP 116/78   Pulse 95   Temp 98.3 F (36.8 C) (Oral)   Resp 12   SpO2 95%    D dimer less than 0.27 CT negative for PE white count 10.4 received 3 nebulizer treatments with duoneb x3, albuterol x2, due now for new albuterol therapy, and received prednisone 60 mg, but continues to wheeze.   Review of Systems: As per HPI otherwise 10 point review of systems negative.   Past Medical History:  Diagnosis Date  . WPW (Wolff-Parkinson-White syndrome)     Past Surgical History:  Procedure Laterality Date  . ABDOMINAL HYSTERECTOMY    . CARDIAC SURGERY    . CESAREAN SECTION      Social History Social History   Social History  . Marital status: Divorced    Spouse name: N/A  . Number of children: N/A  . Years of education: N/A   Occupational History  . Not on file.     Social History Main Topics  . Smoking status: Never Smoker  . Smokeless tobacco: Not on file  . Alcohol use No  . Drug use: No  . Sexual activity: Not on file   Other Topics Concern  . Not on file   Social History Narrative  . No narrative on file     Allergies  Allergen Reactions  . Ciprofloxacin Hives    No family history on file.    Prior to Admission medications   Medication Sig Start Date End Date Taking? Authorizing Provider  hydrocortisone (ANUSOL-HC) 2.5 % rectal cream Apply rectally 2 times daily Patient not taking: Reported on 09/11/2016 02/03/16   Malvern Lions, PA-C    Physical Exam:    Vitals:   09/11/16 0245 09/11/16 0305 09/11/16 0340 09/11/16 0400  BP:  125/79 123/81 116/78  Pulse: (S) (!) 135 99 99 95  Resp:  18 15 12   Temp:      TempSrc:      SpO2: (S) (!) 88% 95% 94% 95%       Constitutional: NAD, calm, comfortable Vitals:   09/11/16 0245 09/11/16 0305 09/11/16 0340 09/11/16 0400  BP:  125/79 123/81 116/78  Pulse: (S) (!) 135 99 99 95  Resp:  18 15 12   Temp:      TempSrc:      SpO2: (S) (!) 88% 95% 94% 95%  Eyes: PERRL, lids and conjunctivae normal ENMT: Mucous membranes are moist. Posterior pharynx clear of any exudate or lesions.Normal dentition.  Neck: normal, supple, no masses, no thyromegaly Respiratory: clear to auscultation bilaterally, diffuse expiratory  wheezing, no crackles or rhonchi . Normal respiratory effort. No accessory muscle use.  Cardiovascular: Regular rate and rhythm, no murmurs / rubs / gallops. No extremity edema. 2+ pedal pulses. No carotid bruits.  Abdomen: no tenderness, no masses palpated. No hepatosplenomegaly. Bowel sounds positive.  Musculoskeletal: no clubbing / cyanosis. No joint deformity upper and lower extremities. Good ROM, no contractures. Normal muscle tone.  Skin: no rashes, lesions, ulcers.  Neurologic: CN 2-12 grossly intact. Sensation intact, DTR normal. Strength 5/5 in all 4.   Psychiatric: Normal judgment and insight. Alert and oriented x 3. Normal mood.     Labs on Admission: I have personally reviewed following labs and imaging studies  CBC:  Recent Labs Lab 09/11/16 0051  WBC 10.4  NEUTROABS 8.9*  HGB 14.5  HCT 42.9  MCV 90.3  PLT [id number]    Basic Metabolic Panel:  Recent Labs Lab 09/11/16 0051  NA 139  K 3.2*  CL 104  CO2 21*  GLUCOSE 166*  BUN 17  CREATININE 1.14*  CALCIUM 9.5    GFR: CrCl cannot be calculated (Unknown ideal weight.).  Liver Function Tests: No results for input(s): AST, ALT, ALKPHOS, BILITOT, PROT, ALBUMIN in the last 168 hours. No results for input(s): LIPASE, AMYLASE in the last 168 hours. No results for input(s): AMMONIA in the last 168 hours.  Coagulation Profile: No results for input(s): INR, PROTIME in the last 168 hours.  Cardiac Enzymes: No results for input(s): CKTOTAL, CKMB, CKMBINDEX, TROPONINI in the last 168 hours.  BNP (last 3 results) No results for input(s): PROBNP in the last 8760 hours.  HbA1C: No results for input(s): HGBA1C in the last 72 hours.  CBG: No results for input(s): GLUCAP in the last 168 hours.  Lipid Profile: No results for input(s): CHOL, HDL, LDLCALC, TRIG, CHOLHDL, LDLDIRECT in the last 72 hours.  Thyroid Function Tests: No results for input(s): TSH, T4TOTAL, FREET4, T3FREE, THYROIDAB in the last 72 hours.  Anemia Panel: No results for input(s): VITAMINB12, FOLATE, FERRITIN, TIBC, IRON, RETICCTPCT in the last 72 hours.  Urine analysis:    Component Value Date/Time   COLORURINE YELLOW 02/03/2016 1424   APPEARANCEUR CLEAR 02/03/2016 1424   LABSPEC 1.023 02/03/2016 1424   PHURINE 5.0 02/03/2016 1424   GLUCOSEU NEGATIVE 02/03/2016 1424   HGBUR NEGATIVE 02/03/2016 1424   BILIRUBINUR NEGATIVE 02/03/2016 1424   KETONESUR NEGATIVE 02/03/2016 1424   PROTEINUR NEGATIVE 02/03/2016 1424   NITRITE NEGATIVE 02/03/2016 1424   LEUKOCYTESUR NEGATIVE 02/03/2016 1424     Sepsis Labs: @LABRCNTIP (procalcitonin:4,lacticidven:4) )No results found for this or any previous visit (from the past 240 hour(s)).   Radiological Exams on Admission: Dg Chest 2 View  Result Date: 09/10/2016 CLINICAL DATA:  Acute onset of wheezing and scratchy throat. Shortness of breath and generalized chest discomfort. Initial encounter. EXAM: CHEST  2 VIEW COMPARISON:  None. FINDINGS: The lungs are well-aerated and clear. There is no evidence of focal opacification, pleural effusion or pneumothorax. The heart is normal in size; the mediastinal contour is within normal limits. The patient is status post median sternotomy. Clips are noted within the right upper quadrant, reflecting prior cholecystectomy. No acute osseous abnormalities are seen. IMPRESSION: No acute cardiopulmonary process seen. Electronically Signed   By: Garald Balding M.D.   On: 09/10/2016  22:27   Ct Angio Chest Pe W And/or Wo Contrast  Result Date: 09/11/2016 CLINICAL DATA:  Acute onset of dyspnea on exertion and hypoxia. Initial encounter. EXAM: CT ANGIOGRAPHY CHEST WITH CONTRAST TECHNIQUE: Multidetector CT imaging of the chest was performed using the standard protocol during bolus administration of intravenous contrast. Multiplanar CT image reconstructions and MIPs were obtained to evaluate the vascular anatomy. CONTRAST:  100 mL of Isovue 370 IV contrast COMPARISON:  Chest radiograph performed 09/10/2016 FINDINGS: Cardiovascular:  There is no evidence of pulmonary embolus. The heart is unremarkable in appearance. The thoracic aorta is unremarkable. No calcific atherosclerotic disease is seen. The great vessels are grossly unremarkable in appearance. Mediastinum/Nodes: The mediastinum is unremarkable in appearance. No mediastinal lymphadenopathy is seen. No pericardial effusion is identified. The patient is status post median sternotomy. The visualized portions of the thyroid gland are unremarkable. No axillary  lymphadenopathy is seen. Lungs/Pleura: Minimal bibasilar atelectasis is noted. The lungs are otherwise clear. No masses are identified. No pleural effusion or pneumothorax is seen. Upper Abdomen: The visualized portions of the liver and spleen are grossly unremarkable. Musculoskeletal: No acute osseous abnormalities are identified. The visualized musculature is unremarkable in appearance. Review of the MIP images confirms the above findings. IMPRESSION: 1. No evidence of pulmonary embolus. 2. Minimal bibasilar atelectasis noted.  Lungs otherwise clear. Electronically Signed   By: Garald Balding M.D.   On: 09/11/2016 04:52    EKG: Independently reviewed.  Assessment/Plan Active Problems:   Bronchospasm    Asthma exacerbation received 3 nebulizer treatments would do more nail and albuterol  then had 60 mg of prednisone with some improvement. CT is negative for PE. D dimerless than 0.27 . White count 10.4 - Admit to  tele Obs  - Observation - Duonebs / Albuterol nebs  - Steroids with solumedrol 60 mg IV q 6 h  Robitussin prn  - O2 - CBC in am Sputum cultures   History of WPW. No EKG or Tn to evaluate. Denies CP  Check EKG, Tn here o/w no intervention indicated at this time   DVT prophylaxis: Lovenox   Code Status:   Full     Family Communication:  Discussed with patient Disposition Plan: Expect patient to be discharged to home after condition improves Consults called:    None Admission status Obs tele  Rondel Jumbo, PA-C [facility]   09/11/2016, 6:48 AM

## 2016-09-11 NOTE — ED Notes (Signed)
Attempted to call report

## 2016-09-11 NOTE — ED Provider Notes (Signed)
Wheezing, cough for several days. Saturations with ambulation drop to below 90%. SOB with exertion.   She has had 3 nebulizer treatments with minimal improvements. Has had 60 mg prednisone.   Pending d-dimer. Will need re-evaluation for DOE and hypoxia.   Patient remains hypoxic when ambulating, SOB despite multiple Albuterol treatments. CT performed secondary to respiratory difficulty and is negative for PE. She will need admission for serial nebulizer treatments and improvement in symptoms.   Discussed with hospitalist who accepts the patient onto their service.    Charlann Lange, PA-C 09/17/16 TX:[id number]    Gareth Morgan, MD 09/17/16 1332

## 2016-09-11 NOTE — ED Notes (Signed)
Walked with patient on pulse ox.  When moving patient dropped to 88%.  NP made aware and to order breathing tx

## 2016-09-11 NOTE — ED Notes (Signed)
attempted report x1 

## 2016-09-11 NOTE — ED Notes (Signed)
Patient transported to CT 

## 2016-09-11 NOTE — ED Notes (Signed)
RN at bedside

## 2016-09-11 NOTE — ED Notes (Signed)
Ambulated Pt independently in hallway. Beginning O2 sat 96%. Desat to 89%. Pt denied dizziness. Tremor noted in upper extremities.

## 2016-09-11 NOTE — Progress Notes (Signed)
This is a no charge note   Pending admission per PA, Upstill  40 year old lady with past medical history of childhood asthma, WPW, who presents with cough, wheezing without fever. Chest x-ray negative. CT angiogram negative for PE. Clinically has asthma exacerbation. Pt is accepted to tele for obs.  Ivor Costa, MD  [facility] Pager [phone]  If 7PM-7AM, please contact night-coverage [email or web address] Password [email or web address] 09/11/2016, 6:30 AM

## 2016-09-11 NOTE — ED Notes (Signed)
Patient moved over from [place] to [facility].  Patient explained reason for moving and the events to follow and verbalized understanding.  Next nurse given report of patient and unable to keep O2 saturation above 90% while ambulating.  Patient stated that she still feels the chest tightness with breathing, RN and PA aware.

## 2016-09-11 NOTE — ED Notes (Signed)
Attempted to call report. ED Charge nurse aware.

## 2016-09-11 NOTE — Progress Notes (Signed)
Pt c/o lungs being "dry and burning."  She also stated that the "continuous" respiratory treatment that she received in ED helped her more than the duo neb trts that she has been receiving here on the unit.  MD notified.  No orders given.

## 2016-09-12 DIAGNOSIS — J9801 Acute bronchospasm: Secondary | ICD-10-CM

## 2016-09-12 LAB — GLUCOSE, CAPILLARY
GLUCOSE-CAPILLARY: 260 mg/dL — AB (ref 65–99)
Glucose-Capillary: 180 mg/dL — ABNORMAL HIGH (ref 65–99)

## 2016-09-12 LAB — RESPIRATORY PANEL BY PCR
ADENOVIRUS-RVPPCR: NOT DETECTED
Bordetella pertussis: NOT DETECTED
CHLAMYDOPHILA PNEUMONIAE-RVPPCR: NOT DETECTED
CORONAVIRUS 229E-RVPPCR: NOT DETECTED
CORONAVIRUS HKU1-RVPPCR: NOT DETECTED
CORONAVIRUS NL63-RVPPCR: NOT DETECTED
CORONAVIRUS OC43-RVPPCR: NOT DETECTED
Influenza A: NOT DETECTED
Influenza B: NOT DETECTED
MYCOPLASMA PNEUMONIAE-RVPPCR: NOT DETECTED
Metapneumovirus: NOT DETECTED
PARAINFLUENZA VIRUS 1-RVPPCR: NOT DETECTED
PARAINFLUENZA VIRUS 4-RVPPCR: NOT DETECTED
Parainfluenza Virus 2: NOT DETECTED
Parainfluenza Virus 3: NOT DETECTED
Respiratory Syncytial Virus: DETECTED — AB
Rhinovirus / Enterovirus: DETECTED — AB

## 2016-09-12 LAB — CBC
HCT: 39.3 % (ref 36.0–46.0)
HEMOGLOBIN: 13 g/dL (ref 12.0–15.0)
MCH: 30 pg (ref 26.0–34.0)
MCHC: 33.1 g/dL (ref 30.0–36.0)
MCV: 90.6 fL (ref 78.0–100.0)
Platelets: 381 10*3/uL (ref 150–400)
RBC: 4.34 MIL/uL (ref 3.87–5.11)
RDW: 14 % (ref 11.5–15.5)
WBC: 8.6 10*3/uL (ref 4.0–10.5)

## 2016-09-12 LAB — BLOOD GAS, ARTERIAL
Acid-base deficit: 1.2 mmol/L (ref 0.0–2.0)
Bicarbonate: 23.1 mmol/L (ref 20.0–28.0)
DRAWN BY: 28338
O2 Content: 2.5 L/min
O2 Saturation: 97.3 %
PATIENT TEMPERATURE: 98.6
PH ART: 7.382 (ref 7.350–7.450)
pCO2 arterial: 39.8 mmHg (ref 32.0–48.0)
pO2, Arterial: 98.8 mmHg (ref 83.0–108.0)

## 2016-09-12 LAB — COMPREHENSIVE METABOLIC PANEL
ALK PHOS: 78 U/L (ref 38–126)
ALT: 29 U/L (ref 14–54)
ANION GAP: 11 (ref 5–15)
AST: 28 U/L (ref 15–41)
Albumin: 3.6 g/dL (ref 3.5–5.0)
BILIRUBIN TOTAL: 0.4 mg/dL (ref 0.3–1.2)
BUN: 19 mg/dL (ref 6–20)
CALCIUM: 9.5 mg/dL (ref 8.9–10.3)
CO2: 24 mmol/L (ref 22–32)
Chloride: 104 mmol/L (ref 101–111)
Creatinine, Ser: 0.94 mg/dL (ref 0.44–1.00)
Glucose, Bld: 227 mg/dL — ABNORMAL HIGH (ref 65–99)
Potassium: 3.8 mmol/L (ref 3.5–5.1)
Sodium: 139 mmol/L (ref 135–145)
TOTAL PROTEIN: 7.2 g/dL (ref 6.5–8.1)

## 2016-09-12 LAB — MRSA PCR SCREENING: MRSA BY PCR: NEGATIVE

## 2016-09-12 MED ORDER — ALPRAZOLAM 0.25 MG PO TABS
0.2500 mg | ORAL_TABLET | Freq: Three times a day (TID) | ORAL | Status: DC | PRN
  Administered 2016-09-12 – 2016-09-18 (×16): 0.25 mg via ORAL
  Filled 2016-09-12 (×16): qty 1

## 2016-09-12 MED ORDER — BUDESONIDE 0.25 MG/2ML IN SUSP
0.2500 mg | Freq: Two times a day (BID) | RESPIRATORY_TRACT | Status: DC
  Administered 2016-09-12 – 2016-09-17 (×11): 0.25 mg via RESPIRATORY_TRACT
  Filled 2016-09-12 (×10): qty 2

## 2016-09-12 MED ORDER — INSULIN ASPART 100 UNIT/ML ~~LOC~~ SOLN
0.0000 [IU] | Freq: Three times a day (TID) | SUBCUTANEOUS | Status: DC
  Administered 2016-09-12: 5 [IU] via SUBCUTANEOUS
  Administered 2016-09-13: 3 [IU] via SUBCUTANEOUS
  Administered 2016-09-13 (×2): 7 [IU] via SUBCUTANEOUS
  Administered 2016-09-14 (×2): 5 [IU] via SUBCUTANEOUS
  Administered 2016-09-14 – 2016-09-15 (×2): 9 [IU] via SUBCUTANEOUS
  Administered 2016-09-15: 7 [IU] via SUBCUTANEOUS
  Administered 2016-09-15 – 2016-09-16 (×4): 3 [IU] via SUBCUTANEOUS
  Administered 2016-09-17: 5 [IU] via SUBCUTANEOUS
  Administered 2016-09-17: 9 [IU] via SUBCUTANEOUS
  Administered 2016-09-18: 3 [IU] via SUBCUTANEOUS

## 2016-09-12 MED ORDER — IPRATROPIUM BROMIDE 0.02 % IN SOLN
0.5000 mg | Freq: Four times a day (QID) | RESPIRATORY_TRACT | Status: DC
  Administered 2016-09-12 – 2016-09-19 (×23): 0.5 mg via RESPIRATORY_TRACT
  Filled 2016-09-12 (×24): qty 2.5

## 2016-09-12 MED ORDER — LEVALBUTEROL HCL 0.63 MG/3ML IN NEBU
0.6300 mg | INHALATION_SOLUTION | Freq: Four times a day (QID) | RESPIRATORY_TRACT | Status: DC
  Administered 2016-09-12 – 2016-09-19 (×23): 0.63 mg via RESPIRATORY_TRACT
  Filled 2016-09-12 (×24): qty 3

## 2016-09-12 MED ORDER — GUAIFENESIN ER 600 MG PO TB12
1200.0000 mg | ORAL_TABLET | Freq: Two times a day (BID) | ORAL | Status: DC
  Administered 2016-09-12: 1200 mg via ORAL

## 2016-09-12 MED ORDER — GUAIFENESIN-DM 100-10 MG/5ML PO SYRP
5.0000 mL | ORAL_SOLUTION | ORAL | Status: DC | PRN
  Administered 2016-09-12 (×3): 5 mL via ORAL
  Filled 2016-09-12 (×3): qty 5

## 2016-09-12 MED ORDER — HYDROCOD POLST-CPM POLST ER 10-8 MG/5ML PO SUER
5.0000 mL | Freq: Two times a day (BID) | ORAL | Status: DC | PRN
  Administered 2016-09-12 – 2016-09-17 (×7): 5 mL via ORAL
  Filled 2016-09-12 (×8): qty 5

## 2016-09-12 MED ORDER — GUAIFENESIN ER 600 MG PO TB12: 1200.0000 mg | ORAL_TABLET | Freq: Two times a day (BID) | ORAL | Status: DC

## 2016-09-12 NOTE — Progress Notes (Signed)
CRITICAL VALUE ALERT  Critical value received:  RSV Positive   Date of notification:  09/12/2016  Time of notification:  1100  Critical value read back:yes   Nurse who received alert: Enos Fling, RN   MD notified (1st page):  Dr. Renne Crigler    MD on the unit and made aware. No new orders

## 2016-09-12 NOTE — Plan of Care (Signed)
Problem: Activity: Goal: Risk for activity intolerance will decrease Outcome: Progressing Patient is able to ambulate without being short of breath  Problem: Nutrition: Goal: Adequate nutrition will be maintained Outcome: Progressing Patient is eating well.

## 2016-09-12 NOTE — Progress Notes (Signed)
PROGRESS NOTE  Michelle Gibson [id number] DOB: 1975-12-04 DOA: 09/10/2016 PCP: No PCP Per Patient   LOS: 1 day   Brief Narrative: 40 yo F with history of asthma, well controlled on no medications, WPW, admitted on 12/2 with worsening shortness of breath, cough and wheezing. No fever chills nausea or vomiting. CT angiogram in the emergency room negative for PE  Assessment & Plan: Active Problems:   Bronchospasm  Asthma exacerbation - Continue supportive treatment with IV steroids, scheduled nebulizers, Robitussin - Oxygen as needed, currently on room air - Patient with significant wheezing and feels like chest is tighter, also significant anxiety, move patient to step down for closer monitoring today. Xanax for anxiety - Obtain an ABG - Ordered respiratory virus panel  History of WPW - Stable   DVT prophylaxis: Lovenox Code Status: Full code Family Communication: Home when improved Disposition Plan: Transfer to step down today  Consultants:   None   Procedures:   None   Antimicrobials:  None    Subjective: - no chest pain, no abdominal pain, nausea or vomiting.  - [place] of significant shortness of breath and inability to take deep breaths. Also complains of a cough.  Objective: Vitals:   09/12/16 0028 09/12/16 0414 09/12/16 0418 09/12/16 0740  BP:  (!) 112/59    Pulse:  (!) 108    Resp:  20    Temp:  97.8 F (36.6 C)    TempSrc:  Oral    SpO2: 99% 97% 97% 98%  Weight:  75 kg (165 lb 4.8 oz)    Height:        Intake/Output Summary (Last 24 hours) at 09/12/16 1010 Last data filed at 09/12/16 0417  Gross per 24 hour  Intake              834 ml  Output              100 ml  Net              734 ml   Filed Weights   09/11/16 0951 09/12/16 0414  Weight: 75.3 kg (166 lb 0.1 oz) 75 kg (165 lb 4.8 oz)    Examination: Constitutional: anxious, tremulous Vitals:   09/12/16 0028 09/12/16 0414 09/12/16 0418 09/12/16 0740  BP:  (!) 112/59    Pulse:  (!) 108     Resp:  20    Temp:  97.8 F (36.6 C)    TempSrc:  Oral    SpO2: 99% 97% 97% 98%  Weight:  75 kg (165 lb 4.8 oz)    Height:       Eyes: PERRL, lids and conjunctivae normal Respiratory: bilateral wheezing, moves air well Cardiovascular: Regular rate and rhythm, no murmurs / rubs / gallops. No LE edema. Tachycardic  Abdomen: no tenderness. Bowel sounds positive.  Musculoskeletal: no clubbing / cyanosis.  Skin: no rashes, lesions, ulcers. Multiple tattoos  Neurologic: non focal    Data Reviewed: I have personally reviewed following labs and imaging studies  CBC:  Recent Labs Lab 09/11/16 0051 09/12/16 0527  WBC 10.4 8.6  NEUTROABS 8.9*  --   HGB 14.5 13.0  HCT 42.9 39.3  MCV 90.3 90.6  PLT 360 [id number]   Basic Metabolic Panel:  Recent Labs Lab 09/11/16 0051 09/12/16 0527  NA 139 139  K 3.2* 3.8  CL 104 104  CO2 21* 24  GLUCOSE 166* 227*  BUN 17 19  CREATININE 1.14* 0.94  CALCIUM 9.5 9.5  GFR: Estimated Creatinine Clearance: 84 mL/min (by C-G formula based on SCr of 0.94 mg/dL). Liver Function Tests:  Recent Labs Lab 09/12/16 0527  AST 28  ALT 29  ALKPHOS 78  BILITOT 0.4  PROT 7.2  ALBUMIN 3.6   No results for input(s): LIPASE, AMYLASE in the last 168 hours. No results for input(s): AMMONIA in the last 168 hours. Coagulation Profile: No results for input(s): INR, PROTIME in the last 168 hours. Cardiac Enzymes:  Recent Labs Lab 09/11/16 0815  TROPONINI <0.03   BNP (last 3 results) No results for input(s): PROBNP in the last 8760 hours. HbA1C: No results for input(s): HGBA1C in the last 72 hours. CBG: No results for input(s): GLUCAP in the last 168 hours. Lipid Profile: No results for input(s): CHOL, HDL, LDLCALC, TRIG, CHOLHDL, LDLDIRECT in the last 72 hours. Thyroid Function Tests: No results for input(s): TSH, T4TOTAL, FREET4, T3FREE, THYROIDAB in the last 72 hours. Anemia Panel: No results for input(s): VITAMINB12, FOLATE, FERRITIN,  TIBC, IRON, RETICCTPCT in the last 72 hours. Urine analysis:    Component Value Date/Time   COLORURINE YELLOW 02/03/2016 1424   APPEARANCEUR CLEAR 02/03/2016 1424   LABSPEC 1.023 02/03/2016 1424   PHURINE 5.0 02/03/2016 1424   GLUCOSEU NEGATIVE 02/03/2016 1424   HGBUR NEGATIVE 02/03/2016 1424   BILIRUBINUR NEGATIVE 02/03/2016 1424   KETONESUR NEGATIVE 02/03/2016 1424   PROTEINUR NEGATIVE 02/03/2016 1424   NITRITE NEGATIVE 02/03/2016 1424   LEUKOCYTESUR NEGATIVE 02/03/2016 1424   Sepsis Labs: Invalid input(s): PROCALCITONIN, LACTICIDVEN  No results found for this or any previous visit (from the past 240 hour(s)).    Radiology Studies: Dg Chest 2 View  Result Date: 09/10/2016 CLINICAL DATA:  Acute onset of wheezing and scratchy throat. Shortness of breath and generalized chest discomfort. Initial encounter. EXAM: CHEST  2 VIEW COMPARISON:  None. FINDINGS: The lungs are well-aerated and clear. There is no evidence of focal opacification, pleural effusion or pneumothorax. The heart is normal in size; the mediastinal contour is within normal limits. The patient is status post median sternotomy. Clips are noted within the right upper quadrant, reflecting prior cholecystectomy. No acute osseous abnormalities are seen. IMPRESSION: No acute cardiopulmonary process seen. Electronically Signed   By: Garald Balding M.D.   On: 09/10/2016 22:27   Ct Angio Chest Pe W And/or Wo Contrast  Result Date: 09/11/2016 CLINICAL DATA:  Acute onset of dyspnea on exertion and hypoxia. Initial encounter. EXAM: CT ANGIOGRAPHY CHEST WITH CONTRAST TECHNIQUE: Multidetector CT imaging of the chest was performed using the standard protocol during bolus administration of intravenous contrast. Multiplanar CT image reconstructions and MIPs were obtained to evaluate the vascular anatomy. CONTRAST:  100 mL of Isovue 370 IV contrast COMPARISON:  Chest radiograph performed 09/10/2016 FINDINGS: Cardiovascular:  There is no  evidence of pulmonary embolus. The heart is unremarkable in appearance. The thoracic aorta is unremarkable. No calcific atherosclerotic disease is seen. The great vessels are grossly unremarkable in appearance. Mediastinum/Nodes: The mediastinum is unremarkable in appearance. No mediastinal lymphadenopathy is seen. No pericardial effusion is identified. The patient is status post median sternotomy. The visualized portions of the thyroid gland are unremarkable. No axillary lymphadenopathy is seen. Lungs/Pleura: Minimal bibasilar atelectasis is noted. The lungs are otherwise clear. No masses are identified. No pleural effusion or pneumothorax is seen. Upper Abdomen: The visualized portions of the liver and spleen are grossly unremarkable. Musculoskeletal: No acute osseous abnormalities are identified. The visualized musculature is unremarkable in appearance. Review of the MIP images confirms  the above findings. IMPRESSION: 1. No evidence of pulmonary embolus. 2. Minimal bibasilar atelectasis noted.  Lungs otherwise clear. Electronically Signed   By: Garald Balding M.D.   On: 09/11/2016 04:52     Scheduled Meds: . budesonide (PULMICORT) nebulizer solution  0.25 mg Nebulization BID  . enoxaparin (LOVENOX) injection  40 mg Subcutaneous Q24H  . guaiFENesin  600 mg Oral BID  . Influenza vac split quadrivalent PF  0.5 mL Intramuscular Tomorrow-1000  . ipratropium  0.5 mg Nebulization Q6H  . levalbuterol  0.63 mg Nebulization Q6H  . methylPREDNISolone (SOLU-MEDROL) injection  60 mg Intravenous Q6H  . sodium chloride flush  3 mL Intravenous Q12H   Continuous Infusions:   Marzetta Board, MD, PhD [facility] Pager [phone]  If 7PM-7AM, please contact night-coverage [email or web address] Password [email or web address] 09/12/2016, 10:10 AM

## 2016-09-12 NOTE — Progress Notes (Signed)
Pt c/o feeling very anxious and difficulty breathing. Respiratory notified MD at the bedside. Xanax given.

## 2016-09-12 NOTE — Progress Notes (Signed)
Patient complaining of persistent cough that is keeping her up. Ordered Robitussin per general admission orders. Patient reports relief from cough.  Will continue to monitor. Hermina Barters Rn,BSN 09/12/2016 4:17 AM

## 2016-09-12 NOTE — Progress Notes (Signed)
Pt transferred to 4N03 All belongings with pt. Tele removed receiving RNs at the beside. Michelle Gibson pts daughter made aware of pt move.

## 2016-09-13 ENCOUNTER — Other Ambulatory Visit (HOSPITAL_COMMUNITY): Payer: Self-pay | Admitting: Radiology

## 2016-09-13 ENCOUNTER — Inpatient Hospital Stay (HOSPITAL_COMMUNITY): Payer: Self-pay

## 2016-09-13 LAB — GLUCOSE, CAPILLARY
GLUCOSE-CAPILLARY: 322 mg/dL — AB (ref 65–99)
Glucose-Capillary: 217 mg/dL — ABNORMAL HIGH (ref 65–99)
Glucose-Capillary: 331 mg/dL — ABNORMAL HIGH (ref 65–99)
Glucose-Capillary: 169 mg/dL — ABNORMAL HIGH (ref 65–99)

## 2016-09-13 MED ORDER — SALINE SPRAY 0.65 % NA SOLN
1.0000 | NASAL | Status: DC | PRN
  Filled 2016-09-13: qty 44

## 2016-09-13 MED ORDER — GUAIFENESIN-DM 100-10 MG/5ML PO SYRP
5.0000 mL | ORAL_SOLUTION | ORAL | Status: DC | PRN
  Administered 2016-09-14 – 2016-09-16 (×6): 5 mL via ORAL
  Filled 2016-09-13 (×6): qty 5

## 2016-09-13 NOTE — Progress Notes (Signed)
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 No fever chills nausea or vomiting. CT angiogram in the emergency room negative for PE  Assessment & Plan: Active Problems:   Bronchospasm   Asthma exacerbation - Continue supportive treatment with IV steroids, scheduled nebulizers, Robitussin - Oxygen as needed, currently on room air - stable today, will re-evaluate in pm and transfer to floor  - ABG yesterday reassuring  - Respiratory virus panel positive for rhinovirus and RSV  History of WPW - Stable   DVT prophylaxis: Lovenox Code Status: Full code Family Communication: Home when improved Disposition Plan: Transfer to step down today  Consultants:   None   Procedures:   None   Antimicrobials:  None    Subjective: - feels a bit better, breathing no longer feeling as tight as yesterday. Ambulated in the room with moderate dyspnea  Objective: Vitals:   09/13/16 0345 09/13/16 0742 09/13/16 0806 09/13/16 1127  BP: 97/69 118/78  125/70  Pulse: (!) 104 (!) 114  97  Resp: 14 (!) 22  (!) 21  Temp: 98.6 F (37 C) 98.5 F (36.9 C)  98 F (36.7 C)  TempSrc: Oral Oral  Oral  SpO2: 96% 96% 97% 97%  Weight:      Height:        Intake/Output Summary (Last 24 hours) at 09/13/16 1147 Last data filed at 09/12/16 1730  Gross per 24 hour  Intake              420 ml  Output                0 ml  Net              420 ml   Filed Weights   09/11/16 0951 09/12/16 0414 09/12/16 1106  Weight: 75.3 kg (166 lb 0.1 oz) 75 kg (165 lb 4.8 oz) 75 kg (165 lb 5.5 oz)    Examination: Constitutional: anxious, tremulous Vitals:   09/13/16 0345 09/13/16 0742 09/13/16 0806 09/13/16 1127  BP: 97/69 118/78  125/70  Pulse: (!) 104 (!) 114  97  Resp: 14 (!)  22  (!) 21  Temp: 98.6 F (37 C) 98.5 F (36.9 C)  98 F (36.7 C)  TempSrc: Oral Oral  Oral  SpO2: 96% 96% 97% 97%  Weight:      Height:       Eyes: PERRL, lids and conjunctivae normal Respiratory: bilateral wheezing, moves air well Cardiovascular: Regular rate and rhythm, no murmurs / rubs / gallops. No LE edema. Tachycardic  Abdomen: no tenderness. Bowel sounds positive.    Data Reviewed: I have personally reviewed following labs and imaging studies  CBC:  Recent Labs Lab 09/11/16 0051 09/12/16 0527  WBC 10.4 8.6  NEUTROABS 8.9*  --   HGB 14.5 13.0  HCT 42.9 39.3  MCV 90.3 90.6  PLT 360 123XX123   Basic Metabolic Panel:  Recent Labs Lab 09/11/16 0051 09/12/16 0527  NA 139 139  K 3.2* 3.8  CL 104 104  CO2 21* 24  GLUCOSE 166* 227*  BUN 17 19  CREATININE 1.14* 0.94  CALCIUM 9.5 9.5   GFR: Estimated Creatinine Clearance: 84 mL/min (by C-G formula based on SCr of 0.94 mg/dL). Liver Function Tests:  Recent Labs Lab 09/12/16 0527  AST 28  ALT 29  ALKPHOS 78  BILITOT 0.4  PROT 7.2  ALBUMIN 3.6   Cardiac Enzymes:  Recent Labs Lab 09/11/16 0815  TROPONINI <0.03   CBG:  Recent Labs Lab 09/12/16 1734 09/12/16 2134 09/13/16 0744 09/13/16 1130  GLUCAP 260* 180* 217* 331*   Urine analysis:    Component Value Date/Time   COLORURINE YELLOW 02/03/2016 [place] 02/03/2016 1424   LABSPEC 1.023 02/03/2016 1424   PHURINE 5.0 02/03/2016 1424   GLUCOSEU NEGATIVE 02/03/2016 1424   HGBUR NEGATIVE 02/03/2016 1424   BILIRUBINUR NEGATIVE 02/03/2016 1424   KETONESUR NEGATIVE 02/03/2016 1424   PROTEINUR NEGATIVE 02/03/2016 1424   NITRITE NEGATIVE 02/03/2016 1424   LEUKOCYTESUR NEGATIVE 02/03/2016 1424   Sepsis Labs: Invalid input(s): PROCALCITONIN, LACTICIDVEN  Recent Results (from the past 240 hour(s))  Respiratory Panel by PCR     Status: Abnormal   Collection Time: 09/12/16  8:39 AM  Result Value Ref Range Status   Adenovirus NOT  DETECTED NOT DETECTED Final   Coronavirus 229E NOT DETECTED NOT DETECTED Final   Coronavirus HKU1 NOT DETECTED NOT DETECTED Final   Coronavirus NL63 NOT DETECTED NOT DETECTED Final   Coronavirus OC43 NOT DETECTED NOT DETECTED Final   Metapneumovirus NOT DETECTED NOT DETECTED Final   Rhinovirus / Enterovirus DETECTED (A) NOT DETECTED Final   Influenza A NOT DETECTED NOT DETECTED Final   Influenza B NOT DETECTED NOT DETECTED Final   Parainfluenza Virus 1 NOT DETECTED NOT DETECTED Final   Parainfluenza Virus 2 NOT DETECTED NOT DETECTED Final   Parainfluenza Virus 3 NOT DETECTED NOT DETECTED Final   Parainfluenza Virus 4 NOT DETECTED NOT DETECTED Final   Respiratory Syncytial Virus DETECTED (A) NOT DETECTED Final    Comment: CRITICAL RESULT CALLED TO, READ BACK BY AND VERIFIED WITH: T UPHAM 09/12/16 @ 57 M VESTAL    Bordetella pertussis NOT DETECTED NOT DETECTED Final   Chlamydophila pneumoniae NOT DETECTED NOT DETECTED Final   Mycoplasma pneumoniae NOT DETECTED NOT DETECTED Final  MRSA PCR Screening     Status: None   Collection Time: 09/12/16 11:06 AM  Result Value Ref Range Status   MRSA by PCR NEGATIVE NEGATIVE Final    Comment:        The GeneXpert MRSA Assay (FDA approved for NASAL specimens only), is one component of a comprehensive MRSA colonization surveillance program. It is not intended to diagnose MRSA infection nor to guide or monitor treatment for MRSA infections.     Radiology Studies: Dg Chest Port 1 View  Result Date: 09/13/2016 CLINICAL DATA:  Shortness of breath. EXAM: PORTABLE CHEST 1 VIEW COMPARISON:  CT 09/11/2016. FINDINGS: Mediastinum and hilar structures normal. Lungs are clear of acute infiltrates. Mild left base subsegmental axis. No pleural effusion or pneumothorax. No acute bony abnormality. IMPRESSION: Mild left base subsegmental atelectasis, otherwise negative chest. Electronically Signed   By: [place]   On: 09/13/2016 07:57   Scheduled  Meds: . budesonide (PULMICORT) nebulizer solution  0.25 mg Nebulization BID  . enoxaparin (LOVENOX) injection  40 mg Subcutaneous Q24H  . Influenza vac split quadrivalent PF  0.5 mL Intramuscular Tomorrow-1000  . insulin aspart  0-9 Units Subcutaneous TID WC  . ipratropium  0.5 mg Nebulization Q6H  . levalbuterol  0.63 mg Nebulization Q6H  . methylPREDNISolone (SOLU-MEDROL) injection  60 mg Intravenous Q6H  . sodium chloride flush  3 mL Intravenous Q12H   Continuous Infusions:  [place]  Cruzita Lederer, MD, PhD [facility] Pager [phone]  If 7PM-7AM, please contact night-coverage [email or web address] Password [email or web address] 09/13/2016, 11:47 AM

## 2016-09-13 NOTE — Progress Notes (Signed)
Report called to receiving RN.  VS prior to transfer and remains on Room air, sister at bedside and gathered all patient belongings.

## 2016-09-13 NOTE — Progress Notes (Signed)
   09/13/16 1115  Clinical Encounter Type  Visited With Patient and family together  Visit Type Initial  Referral From Patient  Consult/Referral To Chaplain  Recommendations (follow-up, AD)  Spiritual Encounters  Spiritual Needs Literature  Stress Factors  Patient Stress Factors Health changes  Family Stress Factors Family relationships;Health changes  Advance Directives (For Healthcare)  Does Patient Have a Medical Advance Directive? No  Would patient like information on creating a medical advance directive? Yes (Inpatient - patient requests chaplain consult to create a medical advance directive)  Pt.requested information and forms for Advance Directive.  Pt. Wants to discuss with family member first.  Chaplain left forms and gave an overview of the forms.  Chaplain will follow-up to complete the forms at request of the pt.and family member, Sister is present.  Chaplain advised pt. to call Pastoral services or to  inform Nurse when/or if ready to complete an advance directive.  [employer]  [phone]

## 2016-09-13 NOTE — Progress Notes (Signed)
Inpatient Diabetes Program Recommendations  AACE/ADA: New Consensus Statement on Inpatient Glycemic Control (2015)  Target Ranges:  Prepandial:   less than 140 mg/dL      Peak postprandial:   less than 180 mg/dL (1-2 hours)      Critically ill patients:  140 - 180 mg/dL   Results for Michelle Gibson, Michelle Gibson (MRN [id number]) as of 09/13/2016 12:13  Ref. Range 09/12/2016 17:34 09/12/2016 21:34 09/13/2016 07:44 09/13/2016 11:30  Glucose-Capillary Latest Ref Range: 65 - 99 mg/dL 260 (H)  Novolog 5 units @ 1743 180 (H) 217 (H)  Novolog 3 units @ 0921 (1.5 hours after CBG) 331 (H)  Novolog 7 units @ 1206    Review of Glycemic Control  Diabetes history:       None, steroids this admission, BUN and Creatinine WNL Outpatient Diabetes medications:       None Current orders for Inpatient glycemic control:       Novolog 0-9 units TIDAC, steroids continuing every 6 hours, Carb Modified diet  Inpatient Diabetes Program Recommendations:      Please consider meal coverage of 5 units TIDAC when patient eats > 50% of meal while receiving steroids.      Per ADA recommendations "consider performing an A1C on all patients with diabetes or hyperglycemia admitted to the hospital if not performed in the prior 3 months".      Noted insulin given 1.5 hours after 0744 CBG drawn, which could mean CBG was higher or lower at the time of coverage.  Will continue to monitor.  Thank you,  Windy Carina, RN, BSN Diabetes Coordinator Inpatient Diabetes Program [phone] (Team Pager)

## 2016-09-14 LAB — GLUCOSE, CAPILLARY
GLUCOSE-CAPILLARY: 267 mg/dL — AB (ref 65–99)
GLUCOSE-CAPILLARY: 263 mg/dL — AB (ref 65–99)
GLUCOSE-CAPILLARY: 231 mg/dL — AB (ref 65–99)
Glucose-Capillary: 375 mg/dL — ABNORMAL HIGH (ref 65–99)

## 2016-09-14 NOTE — Care Management Note (Signed)
Case Management Note  Patient Details  Name: Michelle Gibson MRN: [id number] Date of Birth: 22-Nov-1975  Subjective/Objective:      Admitted with Bronchospasm              Action/Plan: Patient is independent of all of her ADL's; She works full time at a [employer], [employer];  no PCP / no Insurance underwriter. Patient is agreeable at discharge to be seen at the [facility] for primary care; she can also get her prescriptions filled there at discharge also. CM talked to patient at length about making wise food choices. Patient stated " my sister bought them for herself and left them in the room." CM noted several bags of crackers, doughnuts, candies, large bag of nuts with chocolate...Marland KitchenMarland KitchenNutrietion consult placed for ongoing education. CM will continue to follow for DCP  Expected Discharge Date: possibly 09/17/2016                 Expected Discharge Plan:  Home/Self Care  Discharge planning Services  CM Consult   Status of Service:  In process, will continue to follow  Sherrilyn Rist [id number] 09/14/2016, 10:31 AM

## 2016-09-14 NOTE — Progress Notes (Signed)
  RD consulted for nutrition education regarding diabetes.   No results found for: HGBA1C  Pt reports that she has been on steroids recently. She was feeling poorly so she purchased a glucometer and her glucose was very high. Pt states that she had gestational diabetes about 15 years ago and could use a refresher on which foods have carbohydrates and raise blood glucose. RD provided "Carbohydrate Counting for People with Diabetes" handout from the Academy of Nutrition and Dietetics. Discussed different food groups and their effects on blood sugar, emphasizing carbohydrate-containing foods. Provided list of carbohydrates and recommended serving sizes of common foods.  Discussed importance of controlled and consistent carbohydrate intake throughout the day. Provided examples of ways to balance meals/snacks and encouraged intake of high-fiber, whole grain complex carbohydrates. Encouraged patient to check her blood glucose regularly and make adjustments to diet and exercise plan in a way that works best for her. Encouraged high intake of non-starchy vegetables. Teach back method used. Pt states that the most important thing is that she decrease her portion sizes. She also reports plans to snack on more vegetables. 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  Expect good compliance.  Body mass index is 27.79 kg/m. Pt meets criteria for Overweight based on current BMI.  Current diet order is Carb Modified, patient is consuming approximately 50-100% of meals at this time. Pt states that her appetite was poor due to breathing difficulty, but she is eating much better today. Labs and medications reviewed. No further nutrition interventions warranted at this time. RD contact information provided. If additional nutrition issues arise, please re-consult RD.  Scarlette Ar RD, CSP, LDN Inpatient Clinical  Dietitian Pager: [phone] After Hours Pager: [phone]

## 2016-09-14 NOTE — Progress Notes (Signed)
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 No fever chills nausea or vomiting. CT angiogram in the emergency room negative for PE. She was initially admitted to regular floor, on 12/3 had an acute episode of significant dyspnea requiring stepdown transfer, was transferred back to the floor on 12/4  Assessment & Plan: Active Problems:   Bronchospasm   Asthma exacerbation - Continue supportive treatment with IV steroids, scheduled nebulizers, Robitussin - Oxygen as needed, currently on room air - stable today, however still has ongoing significant wheezing requiring IV therapies  - Respiratory virus panel positive for rhinovirus and RSV  History of WPW - Stable   DVT prophylaxis: Lovenox Code Status: Full code Family Communication: Home when improved Disposition Plan: Home likely 2-3 days  Consultants:   None   Procedures:   None   Antimicrobials:  None    Subjective: - He feels okay at rest, complains of significant dyspnea with ambulation still  Objective: Vitals:   09/13/16 2152 09/14/16 0648 09/14/16 0814 09/14/16 0817  BP:  114/63    Pulse: 78 (!) 102    Resp: 19 18    Temp:  98 F (36.7 C)    TempSrc:  Oral    SpO2: 98% 98% 98% 98%  Weight:  75.8 kg (167 lb)    Height:        Intake/Output Summary (Last 24 hours) at 09/14/16 1009 Last data filed at 09/14/16 0936  Gross per 24 hour  Intake              903 ml  Output                0 ml  Net              903 ml   Filed Weights   09/12/16 1106 09/13/16 1514 09/14/16 0648  Weight: 75 kg (165 lb 5.5 oz) 75.8 kg (167 lb 3.2 oz) 75.8 kg (167 lb)    Examination: Constitutional: anxious, tremulous Vitals:   09/13/16 2152 09/14/16 0648 09/14/16 0814 09/14/16 0817  BP:   114/63    Pulse: 78 (!) 102    Resp: 19 18    Temp:  98 F (36.7 C)    TempSrc:  Oral    SpO2: 98% 98% 98% 98%  Weight:  75.8 kg (167 lb)    Height:       Eyes: PERRL, lids and conjunctivae normal Respiratory: bilateral wheezing, moves air well Cardiovascular: Regular rate and rhythm, no murmurs / rubs / gallops. No LE edema. Tachycardic  Abdomen: no tenderness. Bowel sounds positive.    Data Reviewed: I have personally reviewed following labs and imaging studies  CBC:  Recent Labs Lab 09/11/16 0051 09/12/16 0527  WBC 10.4 8.6  NEUTROABS 8.9*  --   HGB 14.5 13.0  HCT 42.9 39.3  MCV 90.3 90.6  PLT 360 123XX123   Basic Metabolic Panel:  Recent Labs Lab 09/11/16 0051 09/12/16 0527  NA 139 139  K 3.2* 3.8  CL 104 104  CO2 21* 24  GLUCOSE 166* 227*  BUN 17 19  CREATININE 1.14* 0.94  CALCIUM 9.5 9.5   GFR: Estimated Creatinine Clearance: 82.6 mL/min (by C-G formula based on SCr of 0.94 mg/dL). Liver Function Tests:  Recent Labs Lab 09/12/16 0527  AST 28  ALT 29  ALKPHOS 78  BILITOT 0.4  PROT 7.2  ALBUMIN 3.6   Cardiac Enzymes:  Recent Labs Lab 09/11/16 0815  TROPONINI <0.03   CBG:  Recent Labs Lab 09/13/16 0744 09/13/16 1130 09/13/16 1622 09/13/16 2145 09/14/16 0646  GLUCAP 217* 331* 322* 169* 267*   Urine analysis:    Component Value Date/Time   COLORURINE YELLOW 02/03/2016 [place] 02/03/2016 1424   LABSPEC 1.023 02/03/2016 1424   PHURINE 5.0 02/03/2016 1424   GLUCOSEU NEGATIVE 02/03/2016 1424   HGBUR NEGATIVE 02/03/2016 1424   BILIRUBINUR NEGATIVE 02/03/2016 1424   KETONESUR NEGATIVE 02/03/2016 1424   PROTEINUR NEGATIVE 02/03/2016 1424   NITRITE NEGATIVE 02/03/2016 1424   LEUKOCYTESUR NEGATIVE 02/03/2016 1424   Sepsis Labs: Invalid input(s): PROCALCITONIN, LACTICIDVEN  Recent Results (from the past 240 hour(s))  Respiratory Panel by PCR     Status: Abnormal   Collection Time: 09/12/16  8:39 AM  Result Value  Ref Range Status   Adenovirus NOT DETECTED NOT DETECTED Final   Coronavirus 229E NOT DETECTED NOT DETECTED Final   Coronavirus HKU1 NOT DETECTED NOT DETECTED Final   Coronavirus NL63 NOT DETECTED NOT DETECTED Final   Coronavirus OC43 NOT DETECTED NOT DETECTED Final   Metapneumovirus NOT DETECTED NOT DETECTED Final   Rhinovirus / Enterovirus DETECTED (A) NOT DETECTED Final   Influenza A NOT DETECTED NOT DETECTED Final   Influenza B NOT DETECTED NOT DETECTED Final   Parainfluenza Virus 1 NOT DETECTED NOT DETECTED Final   Parainfluenza Virus 2 NOT DETECTED NOT DETECTED Final   Parainfluenza Virus 3 NOT DETECTED NOT DETECTED Final   Parainfluenza Virus 4 NOT DETECTED NOT DETECTED Final   Respiratory Syncytial Virus DETECTED (A) NOT DETECTED Final    Comment: CRITICAL RESULT CALLED TO, READ BACK BY AND VERIFIED WITH: T UPHAM 09/12/16 @ 49 M VESTAL    Bordetella pertussis NOT DETECTED NOT DETECTED Final   Chlamydophila pneumoniae NOT DETECTED NOT DETECTED Final   Mycoplasma pneumoniae NOT DETECTED NOT DETECTED Final  MRSA PCR Screening     Status: None   Collection Time: 09/12/16 11:06 AM  Result Value Ref Range Status   MRSA by PCR NEGATIVE NEGATIVE Final    Comment:        The GeneXpert MRSA Assay (FDA approved for NASAL specimens only), is one component of a comprehensive MRSA colonization surveillance program. It is not intended to diagnose MRSA infection nor to guide or monitor treatment for MRSA infections.     Radiology Studies: Dg Chest Port 1 View  Result Date: 09/13/2016 CLINICAL DATA:  Shortness of breath. EXAM: PORTABLE CHEST 1 VIEW COMPARISON:  CT 09/11/2016. FINDINGS: Mediastinum and hilar structures normal. Lungs are clear of acute infiltrates. Mild left base subsegmental axis. No pleural effusion or pneumothorax. No acute bony abnormality. IMPRESSION: Mild left base subsegmental atelectasis, otherwise negative chest. Electronically Signed   By: [place]    On: 09/13/2016 07:57   Scheduled Meds: . budesonide (PULMICORT) nebulizer solution  0.25 mg Nebulization BID  . enoxaparin (LOVENOX) injection  40 mg Subcutaneous Q24H  . Influenza vac split quadrivalent PF  0.5 mL Intramuscular Tomorrow-1000  . insulin aspart  0-9 Units Subcutaneous TID WC  . ipratropium  0.5 mg Nebulization Q6H  . levalbuterol  0.63 mg Nebulization Q6H  . methylPREDNISolone (SOLU-MEDROL) injection  60 mg Intravenous Q6H  . sodium chloride flush  3 mL Intravenous Q12H   Continuous  Infusions:  Marzetta Board, MD, PhD [facility] Pager [phone]  If 7PM-7AM, please contact night-coverage [email or web address] Password [email or web address] 09/14/2016, 10:09 AM

## 2016-09-15 DIAGNOSIS — Z8679 Personal history of other diseases of the circulatory system: Secondary | ICD-10-CM

## 2016-09-15 DIAGNOSIS — J45901 Unspecified asthma with (acute) exacerbation: Secondary | ICD-10-CM | POA: Diagnosis present

## 2016-09-15 DIAGNOSIS — R739 Hyperglycemia, unspecified: Secondary | ICD-10-CM | POA: Diagnosis present

## 2016-09-15 DIAGNOSIS — J9601 Acute respiratory failure with hypoxia: Secondary | ICD-10-CM | POA: Diagnosis present

## 2016-09-15 HISTORY — DX: Hyperglycemia, unspecified: R73.9

## 2016-09-15 LAB — GLUCOSE, CAPILLARY
GLUCOSE-CAPILLARY: 355 mg/dL — AB (ref 65–99)
GLUCOSE-CAPILLARY: 197 mg/dL — AB (ref 65–99)
Glucose-Capillary: 220 mg/dL — ABNORMAL HIGH (ref 65–99)
Glucose-Capillary: 311 mg/dL — ABNORMAL HIGH (ref 65–99)

## 2016-09-15 MED ORDER — INSULIN GLARGINE 100 UNIT/ML ~~LOC~~ SOLN
7.0000 [IU] | Freq: Every day | SUBCUTANEOUS | Status: DC
  Administered 2016-09-15 – 2016-09-17 (×3): 7 [IU] via SUBCUTANEOUS
  Filled 2016-09-15 (×4): qty 0.07

## 2016-09-15 MED ORDER — ALBUTEROL SULFATE (2.5 MG/3ML) 0.083% IN NEBU
5.0000 mg | INHALATION_SOLUTION | RESPIRATORY_TRACT | Status: DC | PRN
  Administered 2016-09-17 – 2016-09-18 (×2): 5 mg via RESPIRATORY_TRACT
  Filled 2016-09-15 (×2): qty 6

## 2016-09-15 MED ORDER — INSULIN ASPART 100 UNIT/ML ~~LOC~~ SOLN
5.0000 [IU] | Freq: Three times a day (TID) | SUBCUTANEOUS | Status: DC
  Administered 2016-09-15 – 2016-09-17 (×7): 5 [IU] via SUBCUTANEOUS

## 2016-09-15 MED ORDER — TRAMADOL HCL 50 MG PO TABS
50.0000 mg | ORAL_TABLET | Freq: Four times a day (QID) | ORAL | Status: DC | PRN
  Administered 2016-09-15 – 2016-09-18 (×5): 50 mg via ORAL
  Filled 2016-09-15 (×5): qty 1

## 2016-09-15 MED ORDER — INFLUENZA VAC SPLIT QUAD 0.5 ML IM SUSY
0.5000 mL | PREFILLED_SYRINGE | INTRAMUSCULAR | Status: AC
  Administered 2016-09-16: 0.5 mL via INTRAMUSCULAR

## 2016-09-15 MED ORDER — METHYLPREDNISOLONE SODIUM SUCC 125 MG IJ SOLR
125.0000 mg | Freq: Four times a day (QID) | INTRAMUSCULAR | Status: DC
Start: 2016-09-15 — End: 2016-09-15

## 2016-09-15 MED ORDER — ZOLPIDEM TARTRATE 5 MG PO TABS
5.0000 mg | ORAL_TABLET | Freq: Every evening | ORAL | Status: DC | PRN
  Administered 2016-09-15 – 2016-09-18 (×4): 5 mg via ORAL
  Filled 2016-09-15 (×4): qty 1

## 2016-09-15 MED ORDER — INSULIN ASPART 100 UNIT/ML ~~LOC~~ SOLN
0.0000 [IU] | Freq: Every day | SUBCUTANEOUS | Status: DC
  Administered 2016-09-16: 2 [IU] via SUBCUTANEOUS
  Administered 2016-09-18: 3 [IU] via SUBCUTANEOUS

## 2016-09-15 MED ORDER — INSULIN ASPART 100 UNIT/ML ~~LOC~~ SOLN: 0.0000 [IU] | Freq: Three times a day (TID) | SUBCUTANEOUS | Status: DC

## 2016-09-15 MED ORDER — METHYLPREDNISOLONE SODIUM SUCC 125 MG IJ SOLR
80.0000 mg | Freq: Four times a day (QID) | INTRAMUSCULAR | Status: DC
  Administered 2016-09-15 – 2016-09-17 (×8): 80 mg via INTRAVENOUS
  Filled 2016-09-15 (×8): qty 2

## 2016-09-15 NOTE — Progress Notes (Signed)
PROGRESS NOTE    Michelle Gibson  [id number] DOB: [birth year] DOA: 09/10/2016 PCP: No PCP Per Patient     Brief Narrative:  Michelle Gibson is a 40 yo F with history of asthma, well controlled on no medications, WPW, admitted on 12/2 with worsening shortness of breath, cough and wheezing. CT angiogram in the emergency room negative for PE. She was initially admitted to regular floor, on 12/3 had an acute episode of significant dyspnea requiring stepdown transfer, was transferred back to the floor on 12/4.   Assessment & Plan:   Principal Problem:   Asthma with acute exacerbation Active Problems:   Bronchospasm   Hyperglycemia   History of Wolff-Parkinson-White (WPW) syndrome   Acute respiratory failure with hypoxia (HCC)  Asthma exacerbation and viral URI  - Respiratory virus panel positive for rhinovirus and RSV - Continue supportive treatment with IV steroids, scheduled nebulizers, Robitussin - Very slow to improve  Acute hypoxemic respiratory failure - Secondary to above - [place] O2   Hyperglycemia - Added meal coverage 5u TID with meals while on steroids with SSI  - Ha1c pending   History of WPW - Stable   DVT prophylaxis: Lovenox Code Status: Full Family Communication: at bedside Disposition Plan: pending further improvement    Consultants:   None  Procedures:   None  Antimicrobials:   None     Subjective: Patient states she is not doing well today. She complains of continued shortness of breath as well as chest tightness. Breathing tx seems to help, however, she is very slow to improve. No fevers overnight, has some nausea but no vomiting. Tolerating diet. Has questions regarding her hyperglycemia.   Objective: Vitals:   09/15/16 0537 09/15/16 0730 09/15/16 0732 09/15/16 1133  BP: 128/85   121/69  Pulse: 69   84  Resp: 18   18  Temp: 98 F (36.7 C)   98.2 F (36.8 C)  TempSrc: Oral   Oral  SpO2: 98% 94% 94% 95%  Weight: 76 kg (167 lb 8 oz)       Height:        Intake/Output Summary (Last 24 hours) at 09/15/16 1225 Last data filed at 09/15/16 1136  Gross per 24 hour  Intake              793 ml  Output              800 ml  Net               -7 ml   Filed Weights   09/13/16 1514 09/14/16 0648 09/15/16 0537  Weight: 75.8 kg (167 lb 3.2 oz) 75.8 kg (167 lb) 76 kg (167 lb 8 oz)    Examination:  General exam: Appears calm and comfortable  Respiratory system: Diffuse wheezing Cardiovascular system: S1 & S2 heard, RRR. No JVD, murmurs, rubs, gallops or clicks. No pedal edema. Gastrointestinal system: Abdomen is nondistended, soft and nontender. No organomegaly or masses felt. Normal bowel sounds heard. Central nervous system: Alert and oriented. No focal neurological deficits. Extremities: Symmetric 5 x 5 power. Skin: No rashes, lesions or ulcers Psychiatry: Judgement and insight appear normal. Mood & affect appropriate.   Data Reviewed: I have personally reviewed following labs and imaging studies  CBC:  Recent Labs Lab 09/11/16 0051 09/12/16 0527  WBC 10.4 8.6  NEUTROABS 8.9*  --   HGB 14.5 13.0  HCT 42.9 39.3  MCV 90.3 90.6  PLT 360 123XX123   Basic Metabolic Panel:  Recent Labs Lab 09/11/16 0051 09/12/16 0527  NA 139 139  K 3.2* 3.8  CL 104 104  CO2 21* 24  GLUCOSE 166* 227*  BUN 17 19  CREATININE 1.14* 0.94  CALCIUM 9.5 9.5   GFR: Estimated Creatinine Clearance: 82.8 mL/min (by C-G formula based on SCr of 0.94 mg/dL). Liver Function Tests:  Recent Labs Lab 09/12/16 0527  AST 28  ALT 29  ALKPHOS 78  BILITOT 0.4  PROT 7.2  ALBUMIN 3.6   No results for input(s): LIPASE, AMYLASE in the last 168 hours. No results for input(s): AMMONIA in the last 168 hours. Coagulation Profile: No results for input(s): INR, PROTIME in the last 168 hours. Cardiac Enzymes:  Recent Labs Lab 09/11/16 0815  TROPONINI <0.03   BNP (last 3 results) No results for input(s): PROBNP in the last 8760  hours. HbA1C: No results for input(s): HGBA1C in the last 72 hours. CBG:  Recent Labs Lab 09/14/16 1146 09/14/16 1703 09/14/16 2043 09/15/16 0543 09/15/16 1130  GLUCAP 375* 263* 231* 220* 311*   Lipid Profile: No results for input(s): CHOL, HDL, LDLCALC, TRIG, CHOLHDL, LDLDIRECT in the last 72 hours. Thyroid Function Tests: No results for input(s): TSH, T4TOTAL, FREET4, T3FREE, THYROIDAB in the last 72 hours. Anemia Panel: No results for input(s): VITAMINB12, FOLATE, FERRITIN, TIBC, IRON, RETICCTPCT in the last 72 hours. Sepsis Labs: No results for input(s): PROCALCITON, LATICACIDVEN in the last 168 hours.  Recent Results (from the past 240 hour(s))  Respiratory Panel by PCR     Status: Abnormal   Collection Time: 09/12/16  8:39 AM  Result Value Ref Range Status   Adenovirus NOT DETECTED NOT DETECTED Final   Coronavirus 229E NOT DETECTED NOT DETECTED Final   Coronavirus HKU1 NOT DETECTED NOT DETECTED Final   Coronavirus NL63 NOT DETECTED NOT DETECTED Final   Coronavirus OC43 NOT DETECTED NOT DETECTED Final   Metapneumovirus NOT DETECTED NOT DETECTED Final   Rhinovirus / Enterovirus DETECTED (A) NOT DETECTED Final   Influenza A NOT DETECTED NOT DETECTED Final   Influenza B NOT DETECTED NOT DETECTED Final   Parainfluenza Virus 1 NOT DETECTED NOT DETECTED Final   Parainfluenza Virus 2 NOT DETECTED NOT DETECTED Final   Parainfluenza Virus 3 NOT DETECTED NOT DETECTED Final   Parainfluenza Virus 4 NOT DETECTED NOT DETECTED Final   Respiratory Syncytial Virus DETECTED (A) NOT DETECTED Final    Comment: CRITICAL RESULT CALLED TO, READ BACK BY AND VERIFIED WITH: T UPHAM 09/12/16 @ 57 M VESTAL    Bordetella pertussis NOT DETECTED NOT DETECTED Final   Chlamydophila pneumoniae NOT DETECTED NOT DETECTED Final   Mycoplasma pneumoniae NOT DETECTED NOT DETECTED Final  MRSA PCR Screening     Status: None   Collection Time: 09/12/16 11:06 AM  Result Value Ref Range Status   MRSA by  PCR NEGATIVE NEGATIVE Final    Comment:        The GeneXpert MRSA Assay (FDA approved for NASAL specimens only), is one component of a comprehensive MRSA colonization surveillance program. It is not intended to diagnose MRSA infection nor to guide or monitor treatment for MRSA infections.        Radiology Studies: No results found.    Scheduled Meds: . budesonide (PULMICORT) nebulizer solution  0.25 mg Nebulization BID  . enoxaparin (LOVENOX) injection  40 mg Subcutaneous Q24H  . [START ON 09/16/2016] Influenza vac split quadrivalent PF  0.5 mL Intramuscular Tomorrow-1000  . insulin aspart  0-9 Units Subcutaneous TID WC  .  insulin aspart  5 Units Subcutaneous TID WC  . ipratropium  0.5 mg Nebulization Q6H  . levalbuterol  0.63 mg Nebulization Q6H  . methylPREDNISolone (SOLU-MEDROL) injection  80 mg Intravenous Q6H  . sodium chloride flush  3 mL Intravenous Q12H   Continuous Infusions:   LOS: 4 days    Time spent: 40 minutes   Dessa Phi, DO [facility] [email or web address] Password [email or web address] 09/15/2016, 12:25 PM

## 2016-09-15 NOTE — Progress Notes (Signed)
Inpatient Diabetes Program Recommendations  AACE/ADA: New Consensus Statement on Inpatient Glycemic Control (2015)  Target Ranges:  Prepandial:   less than 140 mg/dL      Peak postprandial:   less than 180 mg/dL (1-2 hours)      Critically ill patients:  140 - 180 mg/dL   Results for Michelle Gibson, Michelle Gibson (MRN [id number]) as of 09/15/2016 08:37  Ref. Range 09/14/2016 06:46 09/14/2016 11:46 09/14/2016 17:03 09/14/2016 20:43 09/15/2016 05:43  Glucose-Capillary Latest Ref Range: 65 - 99 mg/dL 267 (H) 375 (H) 263 (H) 231 (H) 220 (H)   Review of Glycemic Control  Diabetes history: No Outpatient Diabetes medications: NA Current orders for Inpatient glycemic control: Novolog 0-9 units TID with meals, Novolog 5 units TID with meals for meal coverage  Inpatient Diabetes Program Recommendations: Insulin - Basal: If steroids are continued, please consider ordering Lantus 7 units Q24H starting now. If Lantus is ordered as recommended, please note that once steroids are tapered then basal insulin will need to be adjusted. Correction (SSI): Please consider ordering Novolog bedtime correction scale.  Thanks, Barnie Alderman, RN, MSN, CDE Diabetes Coordinator Inpatient Diabetes Program [phone] (Team Pager from 8am to 5pm)

## 2016-09-16 LAB — HEMOGLOBIN A1C
Hgb A1c MFr Bld: 6.3 % — ABNORMAL HIGH (ref 4.8–5.6)
Mean Plasma Glucose: 134 mg/dL

## 2016-09-16 LAB — GLUCOSE, CAPILLARY
GLUCOSE-CAPILLARY: 224 mg/dL — AB (ref 65–99)
GLUCOSE-CAPILLARY: 206 mg/dL — AB (ref 65–99)
Glucose-Capillary: 211 mg/dL — ABNORMAL HIGH (ref 65–99)
Glucose-Capillary: 235 mg/dL — ABNORMAL HIGH (ref 65–99)

## 2016-09-16 MED ORDER — ALBUTEROL (5 MG/ML) CONTINUOUS INHALATION SOLN
5.0000 mg/h | INHALATION_SOLUTION | RESPIRATORY_TRACT | Status: DC
  Filled 2016-09-16: qty 20

## 2016-09-16 MED ORDER — MAGNESIUM SULFATE 2 GM/50ML IV SOLN
2.0000 g | Freq: Once | INTRAVENOUS | Status: AC
  Administered 2016-09-16: 2 g via INTRAVENOUS
  Filled 2016-09-16: qty 50

## 2016-09-16 MED ORDER — MAGNESIUM SULFATE 50 % IJ SOLN
2.0000 g | Freq: Once | INTRAMUSCULAR | Status: DC
  Filled 2016-09-16: qty 4

## 2016-09-16 MED ORDER — RACEPINEPHRINE HCL 2.25 % IN NEBU
0.5000 mL | INHALATION_SOLUTION | Freq: Once | RESPIRATORY_TRACT | Status: AC
  Administered 2016-09-16: 0.5 mL via RESPIRATORY_TRACT
  Filled 2016-09-16: qty 0.5

## 2016-09-16 NOTE — Progress Notes (Addendum)
PROGRESS NOTE    Michelle Gibson  [id number] DOB: 09-24-1976 DOA: 09/10/2016 PCP: No PCP Per Patient     Brief Narrative:  Michelle Gibson is a 40 yo F with history of asthma, well controlled on no medications, WPW, admitted on 12/2 with worsening shortness of breath, cough and wheezing. CT angiogram in the emergency room negative for PE. She was initially admitted to regular floor, on 12/3 had an acute episode of significant dyspnea requiring stepdown transfer, was transferred back to the floor on 12/4.   Assessment & Plan:   Principal Problem:   Asthma with acute exacerbation Active Problems:   Bronchospasm   Hyperglycemia   History of Wolff-Parkinson-White (WPW) syndrome   Acute respiratory failure with hypoxia (HCC)  Asthma exacerbation and viral URI  - Respiratory virus panel positive for rhinovirus and RSV - Continue supportive treatment with IV steroids, scheduled nebulizers, Robitussin  - Very slow to improve and still very wheezy. Will add IV mag and continuous neb to see if any improvement  - ?chemical pneumonitis. 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 Will see if racemic epi and chest physiotherapy helps   Acute hypoxemic respiratory failure - Secondary to above - Off O2 today  Hyperglycemia - Added lantus 7u qhs, meal coverage 5u TID with meals while on steroids with SSI  - Ha1c 6.3   History of WPW - Stable   DVT prophylaxis: Lovenox Code Status: Full Family Communication: no family at bedside Disposition Plan: pending further improvement    Consultants:   None  Procedures:   None  Antimicrobials:   None     Subjective: She complains of continued shortness of breath as well as chest tightness. Breathing tx seems to help but do not provide lasting relief.    Objective: Vitals:   09/15/16 1959 09/15/16 2041 09/16/16 0124 09/16/16 0615  BP: 133/77   124/84  Pulse: 75   85  Resp: 18   18  Temp: 98 F (36.7 C)   98.2 F (36.8 C)  TempSrc:  Oral   Oral  SpO2: 98% 97% 96% 98%  Weight:    75.8 kg (167 lb 3.2 oz)  Height:        Intake/Output Summary (Last 24 hours) at 09/16/16 0714 Last data filed at 09/15/16 2200  Gross per 24 hour  Intake              923 ml  Output             1200 ml  Net             -277 ml   Filed Weights   09/14/16 0648 09/15/16 0537 09/16/16 0615  Weight: 75.8 kg (167 lb) 76 kg (167 lb 8 oz) 75.8 kg (167 lb 3.2 oz)    Examination:  General exam: Appears calm and comfortable  Respiratory system: Diffuse wheezing, no respiratory distress  Cardiovascular system: S1 & S2 heard, RRR. No JVD, murmurs, rubs, gallops or clicks. No pedal edema. Gastrointestinal system: Abdomen is nondistended, soft and nontender. No organomegaly or masses felt. Normal bowel sounds heard. Central nervous system: Alert and oriented. No focal neurological deficits. Extremities: Symmetric 5 x 5 power. Skin: No rashes, lesions or ulcers Psychiatry: Judgement and insight appear normal. Mood & affect appropriate.   Data Reviewed: I have personally reviewed following labs and imaging studies  CBC:  Recent Labs Lab 09/11/16 0051 09/12/16 0527  WBC 10.4 8.6  NEUTROABS 8.9*  --   HGB 14.5  13.0  HCT 42.9 39.3  MCV 90.3 90.6  PLT 360 123XX123   Basic Metabolic Panel:  Recent Labs Lab 09/11/16 0051 09/12/16 0527  NA 139 139  K 3.2* 3.8  CL 104 104  CO2 21* 24  GLUCOSE 166* 227*  BUN 17 19  CREATININE 1.14* 0.94  CALCIUM 9.5 9.5   GFR: Estimated Creatinine Clearance: 82.6 mL/min (by C-G formula based on SCr of 0.94 mg/dL). Liver Function Tests:  Recent Labs Lab 09/12/16 0527  AST 28  ALT 29  ALKPHOS 78  BILITOT 0.4  PROT 7.2  ALBUMIN 3.6   No results for input(s): LIPASE, AMYLASE in the last 168 hours. No results for input(s): AMMONIA in the last 168 hours. Coagulation Profile: No results for input(s): INR, PROTIME in the last 168 hours. Cardiac Enzymes:  Recent Labs Lab 09/11/16 0815  TROPONINI  <0.03   BNP (last 3 results) No results for input(s): PROBNP in the last 8760 hours. HbA1C:  Recent Labs  09/15/16 1033  HGBA1C 6.3*   CBG:  Recent Labs Lab 09/15/16 0543 09/15/16 1130 09/15/16 1620 09/15/16 2128 09/16/16 0618  GLUCAP 220* 311* 355* 197* 211*   Lipid Profile: No results for input(s): CHOL, HDL, LDLCALC, TRIG, CHOLHDL, LDLDIRECT in the last 72 hours. Thyroid Function Tests: No results for input(s): TSH, T4TOTAL, FREET4, T3FREE, THYROIDAB in the last 72 hours. Anemia Panel: No results for input(s): VITAMINB12, FOLATE, FERRITIN, TIBC, IRON, RETICCTPCT in the last 72 hours. Sepsis Labs: No results for input(s): PROCALCITON, LATICACIDVEN in the last 168 hours.  Recent Results (from the past 240 hour(s))  Respiratory Panel by PCR     Status: Abnormal   Collection Time: 09/12/16  8:39 AM  Result Value Ref Range Status   Adenovirus NOT DETECTED NOT DETECTED Final   Coronavirus 229E NOT DETECTED NOT DETECTED Final   Coronavirus HKU1 NOT DETECTED NOT DETECTED Final   Coronavirus NL63 NOT DETECTED NOT DETECTED Final   Coronavirus OC43 NOT DETECTED NOT DETECTED Final   Metapneumovirus NOT DETECTED NOT DETECTED Final   Rhinovirus / Enterovirus DETECTED (A) NOT DETECTED Final   Influenza A NOT DETECTED NOT DETECTED Final   Influenza B NOT DETECTED NOT DETECTED Final   Parainfluenza Virus 1 NOT DETECTED NOT DETECTED Final   Parainfluenza Virus 2 NOT DETECTED NOT DETECTED Final   Parainfluenza Virus 3 NOT DETECTED NOT DETECTED Final   Parainfluenza Virus 4 NOT DETECTED NOT DETECTED Final   Respiratory Syncytial Virus DETECTED (A) NOT DETECTED Final    Comment: CRITICAL RESULT CALLED TO, READ BACK BY AND VERIFIED WITH: T UPHAM 09/12/16 @ 6 M VESTAL    Bordetella pertussis NOT DETECTED NOT DETECTED Final   Chlamydophila pneumoniae NOT DETECTED NOT DETECTED Final   Mycoplasma pneumoniae NOT DETECTED NOT DETECTED Final  MRSA PCR Screening     Status: None    Collection Time: 09/12/16 11:06 AM  Result Value Ref Range Status   MRSA by PCR NEGATIVE NEGATIVE Final    Comment:        The GeneXpert MRSA Assay (FDA approved for NASAL specimens only), is one component of a comprehensive MRSA colonization surveillance program. It is not intended to diagnose MRSA infection nor to guide or monitor treatment for MRSA infections.        Radiology Studies: No results found.    Scheduled Meds: . budesonide (PULMICORT) nebulizer solution  0.25 mg Nebulization BID  . enoxaparin (LOVENOX) injection  40 mg Subcutaneous Q24H  . Influenza vac split  quadrivalent PF  0.5 mL Intramuscular Tomorrow-1000  . insulin aspart  0-5 Units Subcutaneous QHS  . insulin aspart  0-9 Units Subcutaneous TID WC  . insulin aspart  5 Units Subcutaneous TID WC  . insulin glargine  7 Units Subcutaneous QHS  . ipratropium  0.5 mg Nebulization Q6H  . levalbuterol  0.63 mg Nebulization Q6H  . methylPREDNISolone (SOLU-MEDROL) injection  80 mg Intravenous Q6H  . sodium chloride flush  3 mL Intravenous Q12H   Continuous Infusions:   LOS: 5 days    Time spent: 30 minutes   Dessa Phi, DO [facility] [email or web address] Password [email or web address] 09/16/2016, 7:14 AM

## 2016-09-17 ENCOUNTER — Inpatient Hospital Stay (HOSPITAL_COMMUNITY): Payer: Self-pay

## 2016-09-17 DIAGNOSIS — J4551 Severe persistent asthma with (acute) exacerbation: Secondary | ICD-10-CM

## 2016-09-17 DIAGNOSIS — J9601 Acute respiratory failure with hypoxia: Secondary | ICD-10-CM

## 2016-09-17 DIAGNOSIS — Z8679 Personal history of other diseases of the circulatory system: Secondary | ICD-10-CM

## 2016-09-17 LAB — GLUCOSE, CAPILLARY
GLUCOSE-CAPILLARY: 174 mg/dL — AB (ref 65–99)
Glucose-Capillary: 223 mg/dL — ABNORMAL HIGH (ref 65–99)
Glucose-Capillary: 393 mg/dL — ABNORMAL HIGH (ref 65–99)
Glucose-Capillary: 266 mg/dL — ABNORMAL HIGH (ref 65–99)
Glucose-Capillary: 136 mg/dL — ABNORMAL HIGH (ref 65–99)

## 2016-09-17 MED ORDER — METHYLPREDNISOLONE SODIUM SUCC 40 MG IJ SOLR
40.0000 mg | INTRAMUSCULAR | Status: DC
  Administered 2016-09-18: 40 mg via INTRAVENOUS
  Filled 2016-09-17: qty 1

## 2016-09-17 MED ORDER — GUAIFENESIN-DM 100-10 MG/5ML PO SYRP
5.0000 mL | ORAL_SOLUTION | ORAL | Status: DC | PRN
  Administered 2016-09-18: 5 mL via ORAL
  Filled 2016-09-17: qty 5

## 2016-09-17 MED ORDER — INSULIN ASPART 100 UNIT/ML ~~LOC~~ SOLN
7.0000 [IU] | Freq: Three times a day (TID) | SUBCUTANEOUS | Status: DC
  Administered 2016-09-17 – 2016-09-18 (×2): 7 [IU] via SUBCUTANEOUS

## 2016-09-17 MED ORDER — PANTOPRAZOLE SODIUM 40 MG PO TBEC
40.0000 mg | DELAYED_RELEASE_TABLET | Freq: Two times a day (BID) | ORAL | Status: DC
  Administered 2016-09-17 – 2016-09-19 (×4): 40 mg via ORAL
  Filled 2016-09-17 (×4): qty 1

## 2016-09-17 MED ORDER — SALINE SPRAY 0.65 % NA SOLN
2.0000 | NASAL | Status: DC | PRN
  Filled 2016-09-17: qty 44

## 2016-09-17 MED ORDER — BUDESONIDE 0.5 MG/2ML IN SUSP
0.5000 mg | Freq: Two times a day (BID) | RESPIRATORY_TRACT | Status: DC
  Administered 2016-09-17 – 2016-09-19 (×4): 0.5 mg via RESPIRATORY_TRACT
  Filled 2016-09-17 (×4): qty 2

## 2016-09-17 MED ORDER — FLUTICASONE PROPIONATE 50 MCG/ACT NA SUSP
2.0000 | Freq: Every day | NASAL | Status: DC
  Administered 2016-09-17 – 2016-09-19 (×3): 2 via NASAL
  Filled 2016-09-17: qty 16

## 2016-09-17 NOTE — Consult Note (Signed)
PULMONARY / CRITICAL CARE MEDICINE   Name: Michelle Gibson MRN: [id number] DOB: [birth year]    ADMISSION DATE:  09/10/2016 CONSULTATION DATE:  09/17/2016  REFERRING MD:  Dr. Maylene Roes  CHIEF COMPLAINT:  Shortness of breath  HISTORY OF PRESENT ILLNESS:   Pleasant 40 year old female with a past medical history significant for asthma who presented to our emergency department approximately 1 week ago with shortness of breath. She's been followed here by the hospitalist service and treated for an asthma exacerbation for up to 7 days but she continues to have shortness of breath, dry cough, and ongoing wheezing. Because of this pulmonary critical care medicine was consulted on 09/17/2016 for further evaluation.  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 She did it most recently a week ago. However, here in the hospital she's been noted to be positive for rhinovirus as well as RSV.  She does have acid reflux, she continues to have postnasal drip.  She's been out of the bed a bit, but she hasn't walked much outside of the room.  PAST MEDICAL HISTORY :  She  has a past medical history of Asthma and WPW (Wolff-Parkinson-White syndrome).  PAST SURGICAL HISTORY: She  has a past surgical history that includes Abdominal hysterectomy; Cesarean section; and Cardiac surgery.  Allergies  Allergen Reactions  . Ciprofloxacin Hives    No current facility-administered medications on file prior to encounter.    Current Outpatient Prescriptions on File Prior to Encounter  Medication Sig  . hydrocortisone (ANUSOL-HC) 2.5 % rectal cream Apply rectally 2 times daily (Patient not taking: Reported on 09/11/2016)    FAMILY HISTORY:  Her has no family status information on file.    SOCIAL HISTORY: She  reports that she has never smoked. She has never used  smokeless tobacco. She reports that she does not drink alcohol or use drugs.  REVIEW OF SYSTEMS:   Gen: Denies fever, chills, weight change, fatigue, night sweats HEENT: Denies blurred vision, double vision, hearing loss, tinnitus, sinus congestion, rhinorrhea, sore throat, neck stiffness, dysphagia PULM: As above CV: Denies chest pain, edema, orthopnea, paroxysmal nocturnal dyspnea, palpitations GI: Denies abdominal pain, nausea, vomiting, diarrhea, hematochezia, melena, constipation, change in bowel habits GU: Denies dysuria, hematuria, polyuria, oliguria, urethral discharge Endocrine: Denies hot or cold intolerance, polyuria, polyphagia or appetite change Derm: Denies rash, dry skin, scaling or peeling skin change Heme: Denies easy bruising, bleeding, bleeding gums Neuro: Denies headache, numbness, weakness, slurred speech, loss of memory or consciousness   SUBJECTIVE:  As above  VITAL SIGNS: BP (!) 105/52 (BP Location: Right Arm)   Pulse 79   Temp 97.9 F (36.6 C) (Oral)   Resp 18   Ht 5\' 5"  (1.651 m)   Wt 167 lb 3.2 oz (75.8 kg) Comment: scale b  SpO2 94%   BMI 27.82 kg/m   HEMODYNAMICS:    VENTILATOR SETTINGS: FiO2 (%):  [98 %] 98 %  INTAKE / OUTPUT: I/O last 3 completed shifts: In: 65 [P.O.:680; I.V.:3] Out: 1400 [Urine:1400]  PHYSICAL EXAMINATION: General:  Pleasant female, sitting up in bed, no distress Neuro:  Awake, alert, oriented 4 HEENT:  Normocephalic, Atraumatic, oropharynx clear Cardiovascular:  Regular rate and rhythm, no murmurs gallops or rubs Lungs:  Frequent cough, severe and harsh upper airway wheeze Abdomen:  Bowel sounds positive, nontender nondistended Musculoskeletal:  Normal bulk and tone Skin:  No rash  or skin breakdown  LABS:  BMET  Recent Labs Lab 09/11/16 0051 09/12/16 0527  NA 139 139  K 3.2* 3.8  CL 104 104  CO2 21* 24  BUN 17 19  CREATININE 1.14* 0.94  GLUCOSE 166* 227*    Electrolytes  Recent Labs Lab  09/11/16 0051 09/12/16 0527  CALCIUM 9.5 9.5    CBC  Recent Labs Lab 09/11/16 0051 09/12/16 0527  WBC 10.4 8.6  HGB 14.5 13.0  HCT 42.9 39.3  PLT 360 381    Coag's No results for input(s): APTT, INR in the last 168 hours.  Sepsis Markers No results for input(s): LATICACIDVEN, PROCALCITON, O2SATVEN in the last 168 hours.  ABG  Recent Labs Lab 09/12/16 1005  PHART 7.382  PCO2ART 39.8  PO2ART 98.8    Liver Enzymes  Recent Labs Lab 09/12/16 0527  AST 28  ALT 29  ALKPHOS 78  BILITOT 0.4  ALBUMIN 3.6    Cardiac Enzymes  Recent Labs Lab 09/11/16 0815  TROPONINI <0.03    Glucose  Recent Labs Lab 09/16/16 1207 09/16/16 1629 09/16/16 2135 09/17/16 0549 09/17/16 0807 09/17/16 1200  GLUCAP 224* 235* 206* 223* 174* 393*    Imaging No results found.   STUDIES:  09/10/2016 CT angiogram chest. Parenchyma is completely normal with the exception of some very mild patchy subsegmental atelectasis in the bases, no pulmonary embolism.   DISCUSSION: This is a pleasant 40 year old female who has a past medical history significant for asthma who has been admitted with acute asthma exacerbation in the setting of a viral infection. Specifically, she has rhinovirus as well as RSV. In addition to this she notes chemical exposure at work which exposes her for increased risk of pulmonary toxicity.  At this point her ongoing cough, wheezing, and shortness of breath are in fact due to persistent airway inflammation but she also has significant laryngeal, upper airway wheezing. I believe that postnasal drip and acid reflux contributing to the severity of the symptoms.  It should be noted that severe asthma exacerbations due to a viral infection from rhinovirus and RSV can take quite some time to resolve.  In terms of her chemical exposure, the behavior of mixing cleaning chemicals (specifically the bleach and scrubbing bubbles) she is at increased risk of chlorine gas  exposure.  However, the CT scan does not show the expected pattern (pulmonary parenchymal consolidation) that one would expect due to severe chlorine gas toxicity.  ASSESSMENT / PLAN:  A: Acute exacerbation of asthma Rhinovirus and RSV respiratory infection Upper airway wheeze from post nasal drip and acid reflux GERD Post nasal drip P:   Decrease systemic steroids> not helping much at this point Increase budesonide inhaled Continue other bronchodilators as ordered Add GERD treatment > Pantoprazole 40mg  bid Add fluticasone nasal spray Get out of bed Continue cough suppressants Check CXR Wean off O2> making post nasal drip worse   Roselie Awkward, MD [facility] Pager: [phone] Cell: [phone] After 3pm or if no response, call [phone]   09/17/2016, 1:43 PM

## 2016-09-17 NOTE — Progress Notes (Signed)
Pt requested not to rec chest vest during the night.  She wants to rest if possible.

## 2016-09-17 NOTE — Progress Notes (Signed)
PROGRESS NOTE    Michelle Gibson  [id number] DOB: Feb 09, 1976 DOA: 09/10/2016 PCP: No PCP Per Patient     Brief Narrative:  Michelle Gibson is a 40 yo F with history of asthma, well controlled on no medications, WPW, admitted on 12/2 with worsening shortness of breath, cough and wheezing. CT angiogram in the emergency room negative for PE. She was initially admitted to regular floor, on 12/3 had an acute episode of significant dyspnea requiring stepdown transfer, was transferred back to the floor on 12/4.   Assessment & Plan:   Principal Problem:   Asthma with acute exacerbation Active Problems:   Bronchospasm   Hyperglycemia   History of Wolff-Parkinson-White (WPW) syndrome   Acute respiratory failure with hypoxia (HCC)  Asthma exacerbation and viral URI  - Respiratory virus panel positive for rhinovirus and RSV - Component of chemical pneumonitis? 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 Gave IV mag, racemic epi, chest physiotherapy yesterday without improvement  - Consulted pulmonary today   Acute hypoxemic respiratory failure - Secondary to above - Has been on and off O2   Hyperglycemia - Added lantus 7u qhs, meal coverage 5u TID with meals while on steroids with SSI  - Ha1c 6.3   History of WPW - Stable   DVT prophylaxis: Lovenox Code Status: Full Family Communication: no family at bedside Disposition Plan: pending further improvement    Consultants:   Pulmonology   Procedures:   None  Antimicrobials:   None     Subjective: She complains of continued shortness of breath as well as chest tightness. Afebrile, no chills. Essentially no improvement in the past week    Objective: Vitals:   09/16/16 1620 09/16/16 2050 09/16/16 2103 09/17/16 0553  BP:  118/89  127/75  Pulse:  72  77  Resp:   18  18  Temp:  98 F (36.7 C)  98.3 F (36.8 C)  TempSrc:  Oral  Oral  SpO2: 97% 98% 98% 98%  Weight:    75.8 kg (167 lb 3.2 oz)  Height:        Intake/Output Summary (Last 24 hours) at 09/17/16 0827 Last data filed at 09/16/16 1310  Gross per 24 hour  Intake              240 ml  Output             1000 ml  Net             -760 ml   Filed Weights   09/15/16 0537 09/16/16 0615 09/17/16 0553  Weight: 76 kg (167 lb 8 oz) 75.8 kg (167 lb 3.2 oz) 75.8 kg (167 lb 3.2 oz)    Examination:  General exam: Appears calm and comfortable  Respiratory system: Diffuse wheezing, no respiratory distress  Cardiovascular system: S1 & S2 heard, RRR. No JVD, murmurs, rubs, gallops or clicks. No pedal edema. Gastrointestinal system: Abdomen is nondistended, soft and nontender. No organomegaly or masses felt. Normal bowel sounds heard. Central nervous system: Alert and oriented. No focal neurological deficits. Extremities: Symmetric 5 x 5 power. Skin: No rashes, lesions or ulcers Psychiatry: Judgement and insight appear normal. Mood & affect appropriate.   Data Reviewed: I have personally reviewed following labs and imaging studies  CBC:  Recent Labs Lab 09/11/16 0051 09/12/16 0527  WBC 10.4 8.6  NEUTROABS 8.9*  --   HGB 14.5 13.0  HCT 42.9 39.3  MCV 90.3 90.6  PLT 360 123XX123   Basic Metabolic Panel:  Recent Labs Lab 09/11/16 0051 09/12/16 0527  NA 139 139  K 3.2* 3.8  CL 104 104  CO2 21* 24  GLUCOSE 166* 227*  BUN 17 19  CREATININE 1.14* 0.94  CALCIUM 9.5 9.5   GFR: Estimated Creatinine Clearance: 82.6 mL/min (by C-G formula based on SCr of 0.94 mg/dL). Liver Function Tests:  Recent Labs Lab 09/12/16 0527  AST 28  ALT 29  ALKPHOS 78  BILITOT 0.4  PROT 7.2  ALBUMIN 3.6   No results for input(s): LIPASE, AMYLASE in the last 168 hours. No results for input(s): AMMONIA in the last 168 hours. Coagulation Profile: No results for input(s): INR, PROTIME in the last 168  hours. Cardiac Enzymes:  Recent Labs Lab 09/11/16 0815  TROPONINI <0.03   BNP (last 3 results) No results for input(s): PROBNP in the last 8760 hours. HbA1C:  Recent Labs  09/15/16 1033  HGBA1C 6.3*   CBG:  Recent Labs Lab 09/16/16 1207 09/16/16 1629 09/16/16 2135 09/17/16 0549 09/17/16 0807  GLUCAP 224* 235* 206* 223* 174*   Lipid Profile: No results for input(s): CHOL, HDL, LDLCALC, TRIG, CHOLHDL, LDLDIRECT in the last 72 hours. Thyroid Function Tests: No results for input(s): TSH, T4TOTAL, FREET4, T3FREE, THYROIDAB in the last 72 hours. Anemia Panel: No results for input(s): VITAMINB12, FOLATE, FERRITIN, TIBC, IRON, RETICCTPCT in the last 72 hours. Sepsis Labs: No results for input(s): PROCALCITON, LATICACIDVEN in the last 168 hours.  Recent Results (from the past 240 hour(s))  Respiratory Panel by PCR     Status: Abnormal   Collection Time: 09/12/16  8:39 AM  Result Value Ref Range Status   Adenovirus NOT DETECTED NOT DETECTED Final   Coronavirus 229E NOT DETECTED NOT DETECTED Final   Coronavirus HKU1 NOT DETECTED NOT DETECTED Final   Coronavirus NL63 NOT DETECTED NOT DETECTED Final   Coronavirus OC43 NOT DETECTED NOT DETECTED Final   Metapneumovirus NOT DETECTED NOT DETECTED Final   Rhinovirus / Enterovirus DETECTED (A) NOT DETECTED Final   Influenza A NOT DETECTED NOT DETECTED Final   Influenza B NOT DETECTED NOT DETECTED Final   Parainfluenza Virus 1 NOT DETECTED NOT DETECTED Final   Parainfluenza Virus 2 NOT DETECTED NOT DETECTED Final   Parainfluenza Virus 3 NOT DETECTED NOT DETECTED Final   Parainfluenza Virus 4 NOT DETECTED NOT DETECTED Final   Respiratory Syncytial Virus DETECTED (A) NOT DETECTED Final    Comment: CRITICAL RESULT CALLED TO, READ BACK BY AND VERIFIED WITH: T UPHAM 09/12/16 @ 72 M VESTAL    Bordetella pertussis NOT DETECTED NOT DETECTED Final   Chlamydophila pneumoniae NOT DETECTED NOT DETECTED Final   Mycoplasma pneumoniae NOT  DETECTED NOT DETECTED Final  MRSA PCR Screening     Status: None   Collection Time: 09/12/16 11:06 AM  Result Value Ref Range Status   MRSA by PCR NEGATIVE NEGATIVE Final    Comment:        The GeneXpert MRSA Assay (FDA approved for NASAL specimens only), is one component of a comprehensive MRSA colonization surveillance program. It is not intended to diagnose MRSA infection nor to guide or monitor treatment for MRSA infections.        Radiology Studies: No results found.    Scheduled Meds: . budesonide (PULMICORT) nebulizer solution  0.25 mg Nebulization BID  . enoxaparin (LOVENOX) injection  40  mg Subcutaneous Q24H  . insulin aspart  0-5 Units Subcutaneous QHS  . insulin aspart  0-9 Units Subcutaneous TID WC  . insulin aspart  5 Units Subcutaneous TID WC  . insulin glargine  7 Units Subcutaneous QHS  . ipratropium  0.5 mg Nebulization Q6H  . levalbuterol  0.63 mg Nebulization Q6H  . methylPREDNISolone (SOLU-MEDROL) injection  80 mg Intravenous Q6H  . sodium chloride flush  3 mL Intravenous Q12H   Continuous Infusions:   LOS: 6 days    Time spent: 30 minutes   Dessa Phi, DO [facility] [email or web address] Password [facility] 09/17/2016, 8:27 AM

## 2016-09-17 NOTE — Progress Notes (Signed)
Inpatient Diabetes Program Recommendations  AACE/ADA: New Consensus Statement on Inpatient Glycemic Control (2015)  Target Ranges:  Prepandial:   less than 140 mg/dL      Peak postprandial:   less than 180 mg/dL (1-2 hours)      Critically ill patients:  140 - 180 mg/dL   Lab Results  Component Value Date   GLUCAP 174 (H) 09/17/2016   HGBA1C 6.3 (H) 09/15/2016    Review of Glycemic Control Results for NIMCO, MCHENRY (MRN [id number]) as of 09/17/2016 10:19  Ref. Range 09/16/2016 06:18 09/16/2016 12:07 09/16/2016 16:29 09/16/2016 21:35 09/17/2016 05:49 09/17/2016 08:07  Glucose-Capillary Latest Ref Range: 65 - 99 mg/dL 211 (H) 224 (H) 235 (H) 206 (H) 223 (H) 174 (H)   Inpatient Diabetes Program Recommendations: Noted elevated CBGs. Please consider increase in Novolog meal coverage to 7 units tid until steroids tapered.  Thank you, Nani Gasser. Katya Rolston, RN, MSN, CDE Inpatient Glycemic Control Team Team Pager [phone] (8am-5pm) 09/17/2016 10:26 AM

## 2016-09-18 LAB — GLUCOSE, CAPILLARY
GLUCOSE-CAPILLARY: 117 mg/dL — AB (ref 65–99)
GLUCOSE-CAPILLARY: 213 mg/dL — AB (ref 65–99)
GLUCOSE-CAPILLARY: 260 mg/dL — AB (ref 65–99)
Glucose-Capillary: 96 mg/dL (ref 65–99)

## 2016-09-18 MED ORDER — INSULIN ASPART 100 UNIT/ML ~~LOC~~ SOLN
3.0000 [IU] | Freq: Three times a day (TID) | SUBCUTANEOUS | Status: DC
  Administered 2016-09-18 (×2): 3 [IU] via SUBCUTANEOUS

## 2016-09-18 MED ORDER — INSULIN GLARGINE 100 UNIT/ML ~~LOC~~ SOLN
5.0000 [IU] | Freq: Every day | SUBCUTANEOUS | Status: DC
  Administered 2016-09-18: 5 [IU] via SUBCUTANEOUS
  Filled 2016-09-18 (×2): qty 0.05

## 2016-09-18 NOTE — Progress Notes (Signed)
PROGRESS NOTE    Michelle Gibson  [id number] DOB: 1976/05/06 DOA: 09/10/2016 PCP: No PCP Per Patient     Brief Narrative:  Michelle Gibson is a 40 yo F with history of asthma, well controlled on no medications, WPW, admitted on 12/2 with worsening shortness of breath, cough and wheezing. CT angiogram in the emergency room negative for PE. She was initially admitted to regular floor, on 12/3 had an acute episode of significant dyspnea requiring stepdown transfer, was transferred back to the floor on 12/4.   Assessment & Plan:   Principal Problem:   Asthma with acute exacerbation Active Problems:   Bronchospasm   Hyperglycemia   History of Wolff-Parkinson-White (WPW) syndrome   Acute respiratory failure with hypoxia (HCC)  Asthma exacerbation and viral URI  - Respiratory virus panel positive for rhinovirus and RSV - Component of chemical pneumonitis? 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  - CTA chest unremarkable  - Continue supportive treatment with IV steroids, scheduled nebulizers, Robitussin  - Appreciate pulmonary recommendations  - Slow to improve   Acute hypoxemic respiratory failure - Secondary to above - Now off [place] O2   Hyperglycemia - Lantus qhs, meal coverage TID with meals while on steroids with SSI  - Ha1c 6.3   History of WPW - Stable   DVT prophylaxis: Lovenox Code Status: Full Family Communication: no family at bedside Disposition Plan: pending further improvement    Consultants:   Pulmonology   Procedures:   None  Antimicrobials:   None     Subjective: She complains of continued shortness of breath as well as chest tightness. Slowly improving   Objective: Vitals:   09/17/16 1202 09/17/16 1319 09/17/16 2100 09/18/16 0651  BP: (!) 105/52  125/80 120/73  Pulse: 79  75 69  Resp: 18  18 18   Temp: 97.9 F (36.6 C)  98.6 F (37 C) 98.3 F (36.8 C)  TempSrc: Oral  Oral Oral  SpO2: 94% 94% 94% 98%    Weight:    75.8 kg (167 lb 3.2 oz)  Height:        Intake/Output Summary (Last 24 hours) at 09/18/16 1017 Last data filed at 09/18/16 0906  Gross per 24 hour  Intake             1045 ml  Output                1 ml  Net             1044 ml   Filed Weights   09/16/16 0615 09/17/16 0553 09/18/16 0651  Weight: 75.8 kg (167 lb 3.2 oz) 75.8 kg (167 lb 3.2 oz) 75.8 kg (167 lb 3.2 oz)    Examination:  General exam: Appears calm and comfortable  Respiratory system: Diffuse wheezing, no respiratory distress  Cardiovascular system: S1 & S2 heard, RRR. No JVD, murmurs, rubs, gallops or clicks. No pedal edema. Gastrointestinal system: Abdomen is nondistended, soft and nontender. No organomegaly or masses felt. Normal bowel sounds heard. Central nervous system: Alert and oriented. No focal neurological deficits. Extremities: Symmetric 5 x 5 power. Skin: No rashes, lesions or ulcers Psychiatry: Judgement and insight appear normal. Mood & affect appropriate.   Data Reviewed: I have personally reviewed following labs and imaging studies  CBC:  Recent Labs Lab 09/12/16 0527  WBC 8.6  HGB 13.0  HCT 39.3  MCV 90.6  PLT 123XX123   Basic Metabolic Panel:  Recent Labs  Lab 09/12/16 0527  NA 139  K 3.8  CL 104  CO2 24  GLUCOSE 227*  BUN 19  CREATININE 0.94  CALCIUM 9.5   GFR: Estimated Creatinine Clearance: 82.6 mL/min (by C-G formula based on SCr of 0.94 mg/dL). Liver Function Tests:  Recent Labs Lab 09/12/16 0527  AST 28  ALT 29  ALKPHOS 78  BILITOT 0.4  PROT 7.2  ALBUMIN 3.6   No results for input(s): LIPASE, AMYLASE in the last 168 hours. No results for input(s): AMMONIA in the last 168 hours. Coagulation Profile: No results for input(s): INR, PROTIME in the last 168 hours. Cardiac Enzymes: No results for input(s): CKTOTAL, CKMB, CKMBINDEX, TROPONINI in the last 168 hours. BNP (last 3 results) No results for input(s): PROBNP in the last 8760 hours. HbA1C:  Recent  Labs  09/15/16 1033  HGBA1C 6.3*   CBG:  Recent Labs Lab 09/17/16 0807 09/17/16 1200 09/17/16 1631 09/17/16 2203 09/18/16 0628  GLUCAP 174* 393* 266* 136* 96   Lipid Profile: No results for input(s): CHOL, HDL, LDLCALC, TRIG, CHOLHDL, LDLDIRECT in the last 72 hours. Thyroid Function Tests: No results for input(s): TSH, T4TOTAL, FREET4, T3FREE, THYROIDAB in the last 72 hours. Anemia Panel: No results for input(s): VITAMINB12, FOLATE, FERRITIN, TIBC, IRON, RETICCTPCT in the last 72 hours. Sepsis Labs: No results for input(s): PROCALCITON, LATICACIDVEN in the last 168 hours.  Recent Results (from the past 240 hour(s))  Respiratory Panel by PCR     Status: Abnormal   Collection Time: 09/12/16  8:39 AM  Result Value Ref Range Status   Adenovirus NOT DETECTED NOT DETECTED Final   Coronavirus 229E NOT DETECTED NOT DETECTED Final   Coronavirus HKU1 NOT DETECTED NOT DETECTED Final   Coronavirus NL63 NOT DETECTED NOT DETECTED Final   Coronavirus OC43 NOT DETECTED NOT DETECTED Final   Metapneumovirus NOT DETECTED NOT DETECTED Final   Rhinovirus / Enterovirus DETECTED (A) NOT DETECTED Final   Influenza A NOT DETECTED NOT DETECTED Final   Influenza B NOT DETECTED NOT DETECTED Final   Parainfluenza Virus 1 NOT DETECTED NOT DETECTED Final   Parainfluenza Virus 2 NOT DETECTED NOT DETECTED Final   Parainfluenza Virus 3 NOT DETECTED NOT DETECTED Final   Parainfluenza Virus 4 NOT DETECTED NOT DETECTED Final   Respiratory Syncytial Virus DETECTED (A) NOT DETECTED Final    Comment: CRITICAL RESULT CALLED TO, READ BACK BY AND VERIFIED WITH: T UPHAM 09/12/16 @ 72 M VESTAL    Bordetella pertussis NOT DETECTED NOT DETECTED Final   Chlamydophila pneumoniae NOT DETECTED NOT DETECTED Final   Mycoplasma pneumoniae NOT DETECTED NOT DETECTED Final  MRSA PCR Screening     Status: None   Collection Time: 09/12/16 11:06 AM  Result Value Ref Range Status   MRSA by PCR NEGATIVE NEGATIVE Final     Comment:        The GeneXpert MRSA Assay (FDA approved for NASAL specimens only), is one component of a comprehensive MRSA colonization surveillance program. It is not intended to diagnose MRSA infection nor to guide or monitor treatment for MRSA infections.        Radiology Studies: Dg Chest 2 View  Result Date: 09/17/2016 CLINICAL DATA:  Persistent cough and dyspnea.  Asthma. EXAM: CHEST  2 VIEW COMPARISON:  09/13/2016 FINDINGS: The heart is normal in size. There is no aortic aneurysm. Median sternotomy sutures are noted. Some residual left subsegmental basilar atelectasis is seen. Trace blunting of left lateral costophrenic angle may be tiny effusion or  pleural thickening. No suspicious osseous abnormality. IMPRESSION: No active cardiopulmonary disease. Minimal residual left basilar atelectasis. Slight blunting of the left costophrenic angle may represent a tiny effusion or minimal pleural thickening. Electronically Signed   By: Ashley Royalty M.D.   On: 09/17/2016 15:08      Scheduled Meds: . budesonide (PULMICORT) nebulizer solution  0.5 mg Nebulization BID  . enoxaparin (LOVENOX) injection  40 mg Subcutaneous Q24H  . fluticasone  2 spray Each Nare Daily  . insulin aspart  0-5 Units Subcutaneous QHS  . insulin aspart  0-9 Units Subcutaneous TID WC  . insulin aspart  3 Units Subcutaneous TID WC  . insulin glargine  5 Units Subcutaneous QHS  . ipratropium  0.5 mg Nebulization Q6H  . levalbuterol  0.63 mg Nebulization Q6H  . methylPREDNISolone (SOLU-MEDROL) injection  40 mg Intravenous Q24H  . pantoprazole  40 mg Oral BID AC  . sodium chloride flush  3 mL Intravenous Q12H   Continuous Infusions:   LOS: 7 days    Time spent: 30 minutes   Dessa Phi, DO [facility] [email or web address] Password [email or web address] 09/18/2016, 10:17 AM

## 2016-09-19 LAB — GLUCOSE, CAPILLARY: GLUCOSE-CAPILLARY: 95 mg/dL (ref 65–99)

## 2016-09-19 MED ORDER — GUAIFENESIN-DM 100-10 MG/5ML PO SYRP: 5.0000 mL | ORAL_SOLUTION | ORAL | 0 refills | Status: AC | PRN

## 2016-09-19 MED ORDER — "INSULIN SYRINGE 31G X 5/16"" 0.3 ML MISC": 1 refills | Status: DC

## 2016-09-19 MED ORDER — PANTOPRAZOLE SODIUM 40 MG PO TBEC: 40.0000 mg | DELAYED_RELEASE_TABLET | Freq: Two times a day (BID) | ORAL | 0 refills | Status: DC

## 2016-09-19 MED ORDER — FLUTICASONE PROPIONATE 50 MCG/ACT NA SUSP
2.0000 | Freq: Every day | NASAL | 0 refills | Status: DC
Start: 2016-09-19 — End: 2017-10-09

## 2016-09-19 MED ORDER — BUDESONIDE 0.5 MG/2ML IN SUSP: 0.5000 mg | Freq: Two times a day (BID) | RESPIRATORY_TRACT | 0 refills | Status: DC

## 2016-09-19 MED ORDER — INSULIN ASPART 100 UNIT/ML ~~LOC~~ SOLN: SUBCUTANEOUS | 0 refills | Status: DC

## 2016-09-19 MED ORDER — ALBUTEROL SULFATE HFA 108 (90 BASE) MCG/ACT IN AERS: 2.0000 | INHALATION_SPRAY | RESPIRATORY_TRACT | 0 refills | Status: DC | PRN

## 2016-09-19 MED ORDER — BLOOD GLUCOSE MONITOR KIT: PACK | 0 refills | Status: DC

## 2016-09-19 MED ORDER — PREDNISONE 10 MG PO TABS: ORAL_TABLET | ORAL | 0 refills | Status: DC

## 2016-09-19 NOTE — Discharge Summary (Addendum)
Physician Discharge Summary  Michelle Gibson TIR:[id number] DOB: 19-Jan-1976 DOA: 09/10/2016  PCP: No PCP Per Patient. Patient provided with [facility] information to set up with PCP.   Admit date: 09/10/2016 Discharge date: 09/19/2016  Admitted From: Home Disposition:  Home  Recommendations for Outpatient Follow-up:  1. Follow up with PCP in 1 week.  2. Check your blood sugar 4 times daily, before meals and before bedtime, and administer novolog on a correction factor as listed.  3. Take prednisone (steroid) taper as directed.  4. Take breathing inhalers, flonase nasal spray as directed.  5. Take protonix for GERD.  6. Would recommend repeat chest x-ray as outpatient to see resolution of atelectasis and small pleural effusion. 7. Recommended to patient to find other line of work away from chemicals, if possible.   Home Health: No  Equipment/Devices: None   Discharge Condition: Stable CODE STATUS: Full  Diet recommendation: Regular  Brief/Interim Summary: Michelle Gibson is a 40 yo F with history of asthma, well controlled on no medications, WPW, admitted on 12/2 with worsening shortness of breath, cough and wheezing. CT angiogram in the emergency room negative for PE. She was initially admitted to regular floor, on 12/3 had anacute episode of significant dyspnea requiring stepdown transfer, was transferred back to the floor on 12/4. She was treated with IV steroids, breathing treatments. Respiratory panel was positive for RSV as well as rhinovirus. She had very slow improvement. She was weaned off oxygen. Pulmonology was consulted during hospitalization as well.  Subjective on day of discharge: Feeling better today. She states that her chest tightness is now resolving. Denies any other chest pain. Cough as well as shortness of breath is improving as well today. Eager to go home.  Discharge Diagnoses:  Principal Problem:   Asthma with acute exacerbation Active Problems:    Bronchospasm   Hyperglycemia   History of Wolff-Parkinson-White (WPW) syndrome   Acute respiratory failure with hypoxia (HCC)  Asthma exacerbation and viral URI  - Respiratory virus panel positive for rhinovirus and RSV - Component of chemical pneumonitis? 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  - CTA chest unremarkable  - Appreciate pulmonary recommendations  - Slow to improve  - Discharge home with prednisone taper, albuterol, Pulmicort, Flonase, Protonix.  Acute hypoxemic respiratory failure - Secondary to above - Now off [place] O2   Hyperglycemia - Ha1c 6.3  - Due to steroid. Will discharge home with glucometer as well as supplies, NovoLog on a correction sliding scale. I suspect she will wean off insulin use when she is off prednisone.  History of WPW - Stable   Discharge Instructions  Discharge Instructions    Diet general    Complete by:  As directed    Increase activity slowly    Complete by:  As directed        Medication List    STOP taking these medications   hydrocortisone 2.5 % rectal cream Commonly known as:  ANUSOL-HC     TAKE these medications   albuterol 108 (90 Base) MCG/ACT inhaler Commonly known as:  PROVENTIL HFA;VENTOLIN HFA Inhale 2 puffs into the lungs every 4 (four) hours as needed for wheezing or shortness of breath.   blood glucose meter kit and supplies Kit Dispense based on patient and insurance preference. Use up to four times daily as directed. (FOR ICD-9 250.00, 250.01).   budesonide 0.5 MG/2ML nebulizer solution Commonly known as:  PULMICORT Take 2  mLs (0.5 mg total) by nebulization 2 (two) times daily.   fluticasone 50 MCG/ACT nasal spray Commonly known as:  FLONASE Place 2 sprays into both nostrils daily.   guaiFENesin-dextromethorphan 100-10 MG/5ML syrup Commonly known as:  ROBITUSSIN DM Take 5 mLs by mouth every 4 (four) hours as needed for cough.   insulin aspart 100 UNIT/ML  injection Commonly known as:  novoLOG Three times daily with meals depending on your blood sugar: BG 121 - 150: 1 unit BG 151 - 200: 2 units BG 201 - 250: 3 units BG 251 - 300: 5 units BG 301 - 350: 7 units BG 351 - 400: 9 units   insulin aspart 100 UNIT/ML injection Commonly known as:  novoLOG Once daily, before bed depending on your blood sugar   BG 201 - 250: 2 units BG 251 - 300: 3 units BG 301 - 350: 4 units BG 351 - 400: 5 units   INSULIN SYRINGE .3CC/31GX5/16" 31G X 5/16" 0.3 ML Misc Use up to 4 times daily for correction insulin   pantoprazole 40 MG tablet Commonly known as:  PROTONIX Take 1 tablet (40 mg total) by mouth 2 (two) times daily before a meal.   predniSONE 10 MG tablet Commonly known as:  DELTASONE Take 4 tabs for 3 days, then 3 tabs for 3 days, then 2 tabs for 3 days, then 1 tab for 3 days, then 1/2 tab for 4 days.      Follow-up Information    [facility]   Specialty:  Emergency Medicine Why:  If symptoms worsen Contact information: [address] [id number] [place]. Schedule an appointment as soon as possible for a visit in 1 week(s).   Contact information: [place] [address] [phone]         Allergies  Allergen Reactions  . Ciprofloxacin Hives    Consultations:  Pulmonology   Procedures/Studies: Dg Chest 2 View  Result Date: 09/17/2016 CLINICAL DATA:  Persistent cough and dyspnea.  Asthma. EXAM: CHEST  2 VIEW COMPARISON:  09/13/2016 FINDINGS: The heart is normal in size. There is no aortic aneurysm. Median sternotomy sutures are noted. Some residual left subsegmental basilar atelectasis is seen. Trace blunting of left lateral costophrenic angle may be tiny effusion or pleural thickening. No suspicious osseous abnormality. IMPRESSION: No active  cardiopulmonary disease. Minimal residual left basilar atelectasis. Slight blunting of the left costophrenic angle may represent a tiny effusion or minimal pleural thickening. Electronically Signed   By: Ashley Royalty M.D.   On: 09/17/2016 15:08   Dg Chest 2 View  Result Date: 09/10/2016 CLINICAL DATA:  Acute onset of wheezing and scratchy throat. Shortness of breath and generalized chest discomfort. Initial encounter. EXAM: CHEST  2 VIEW COMPARISON:  None. FINDINGS: The lungs are well-aerated and clear. There is no evidence of focal opacification, pleural effusion or pneumothorax. The heart is normal in size; the mediastinal contour is within normal limits. The patient is status post median sternotomy. Clips are noted within the right upper quadrant, reflecting prior cholecystectomy. No acute osseous abnormalities are seen. IMPRESSION: No acute cardiopulmonary process seen. Electronically Signed   By: Garald Balding M.D.   On: 09/10/2016 22:27   Ct Angio Chest Pe W And/or Wo Contrast  Result Date: 09/11/2016 CLINICAL DATA:  Acute onset of dyspnea on exertion and hypoxia. Initial encounter. EXAM:  CT ANGIOGRAPHY CHEST WITH CONTRAST TECHNIQUE: Multidetector CT imaging of the chest was performed using the standard protocol during bolus administration of intravenous contrast. Multiplanar CT image reconstructions and MIPs were obtained to evaluate the vascular anatomy. CONTRAST:  100 mL of Isovue 370 IV contrast COMPARISON:  Chest radiograph performed 09/10/2016 FINDINGS: Cardiovascular:  There is no evidence of pulmonary embolus. The heart is unremarkable in appearance. The thoracic aorta is unremarkable. No calcific atherosclerotic disease is seen. The great vessels are grossly unremarkable in appearance. Mediastinum/Nodes: The mediastinum is unremarkable in appearance. No mediastinal lymphadenopathy is seen. No pericardial effusion is identified. The patient is status post median sternotomy. The visualized  portions of the thyroid gland are unremarkable. No axillary lymphadenopathy is seen. Lungs/Pleura: Minimal bibasilar atelectasis is noted. The lungs are otherwise clear. No masses are identified. No pleural effusion or pneumothorax is seen. Upper Abdomen: The visualized portions of the liver and spleen are grossly unremarkable. Musculoskeletal: No acute osseous abnormalities are identified. The visualized musculature is unremarkable in appearance. Review of the MIP images confirms the above findings. IMPRESSION: 1. No evidence of pulmonary embolus. 2. Minimal bibasilar atelectasis noted.  Lungs otherwise clear. Electronically Signed   By: Garald Balding M.D.   On: 09/11/2016 04:52   Dg Chest Port 1 View  Result Date: 09/13/2016 CLINICAL DATA:  Shortness of breath. EXAM: PORTABLE CHEST 1 VIEW COMPARISON:  CT 09/11/2016. FINDINGS: Mediastinum and hilar structures normal. Lungs are clear of acute infiltrates. Mild left base subsegmental axis. No pleural effusion or pneumothorax. No acute bony abnormality. IMPRESSION: Mild left base subsegmental atelectasis, otherwise negative chest. Electronically Signed   By: [place]   On: 09/13/2016 07:57     Discharge Exam: Vitals:   09/18/16 2025 09/19/16 0501  BP: 116/66 100/65  Pulse: 71 62  Resp: 16   Temp: 98.4 F (36.9 C) 97.9 F (36.6 C)   Vitals:   09/18/16 1936 09/18/16 2025 09/19/16 0501 09/19/16 0724  BP:  116/66 100/65   Pulse:  71 62   Resp:  16    Temp:  98.4 F (36.9 C) 97.9 F (36.6 C)   TempSrc:  Oral Oral   SpO2: 96% 96% 96% 96%  Weight:   76.3 kg (168 lb 3.2 oz)   Height:        General: Pt is alert, awake, not in acute distress Cardiovascular: RRR, S1/S2 +, no rubs, no gallops Respiratory: Much improved. Minimal wheezing on left upper lobe, clear to auscultation on right Abdominal: Soft, NT, ND, bowel sounds + Extremities: no edema, no cyanosis    The results of significant diagnostics from this hospitalization  (including imaging, microbiology, ancillary and laboratory) are listed below for reference.     Microbiology: Recent Results (from the past 240 hour(s))  Respiratory Panel by PCR     Status: Abnormal   Collection Time: 09/12/16  8:39 AM  Result Value Ref Range Status   Adenovirus NOT DETECTED NOT DETECTED Final   Coronavirus 229E NOT DETECTED NOT DETECTED Final   Coronavirus HKU1 NOT DETECTED NOT DETECTED Final   Coronavirus NL63 NOT DETECTED NOT DETECTED Final   Coronavirus OC43 NOT DETECTED NOT DETECTED Final   Metapneumovirus NOT DETECTED NOT DETECTED Final   Rhinovirus / Enterovirus DETECTED (A) NOT DETECTED Final   Influenza A NOT DETECTED NOT DETECTED Final   Influenza B NOT DETECTED NOT DETECTED Final   Parainfluenza Virus 1 NOT DETECTED NOT DETECTED Final   Parainfluenza Virus 2 NOT DETECTED NOT DETECTED  Final   Parainfluenza Virus 3 NOT DETECTED NOT DETECTED Final   Parainfluenza Virus 4 NOT DETECTED NOT DETECTED Final   Respiratory Syncytial Virus DETECTED (A) NOT DETECTED Final    Comment: CRITICAL RESULT CALLED TO, READ BACK BY AND VERIFIED WITH: T UPHAM 09/12/16 @ 9 M VESTAL    Bordetella pertussis NOT DETECTED NOT DETECTED Final   Chlamydophila pneumoniae NOT DETECTED NOT DETECTED Final   Mycoplasma pneumoniae NOT DETECTED NOT DETECTED Final  MRSA PCR Screening     Status: None   Collection Time: 09/12/16 11:06 AM  Result Value Ref Range Status   MRSA by PCR NEGATIVE NEGATIVE Final    Comment:        The GeneXpert MRSA Assay (FDA approved for NASAL specimens only), is one component of a comprehensive MRSA colonization surveillance program. It is not intended to diagnose MRSA infection nor to guide or monitor treatment for MRSA infections.      Labs: BNP (last 3 results) No results for input(s): BNP in the last 8760 hours. Basic Metabolic Panel: No results for input(s): NA, K, CL, CO2, GLUCOSE, BUN, CREATININE, CALCIUM, MG, PHOS in the last 168  hours. Liver Function Tests: No results for input(s): AST, ALT, ALKPHOS, BILITOT, PROT, ALBUMIN in the last 168 hours. No results for input(s): LIPASE, AMYLASE in the last 168 hours. No results for input(s): AMMONIA in the last 168 hours. CBC: No results for input(s): WBC, NEUTROABS, HGB, HCT, MCV, PLT in the last 168 hours. Cardiac Enzymes: No results for input(s): CKTOTAL, CKMB, CKMBINDEX, TROPONINI in the last 168 hours. BNP: Invalid input(s): POCBNP CBG:  Recent Labs Lab 09/18/16 0628 09/18/16 1110 09/18/16 1624 09/18/16 2119 09/19/16 0557  GLUCAP 96 117* 213* 260* 95   D-Dimer No results for input(s): DDIMER in the last 72 hours. Hgb A1c No results for input(s): HGBA1C in the last 72 hours. Lipid Profile No results for input(s): CHOL, HDL, LDLCALC, TRIG, CHOLHDL, LDLDIRECT in the last 72 hours. Thyroid function studies No results for input(s): TSH, T4TOTAL, T3FREE, THYROIDAB in the last 72 hours.  Invalid input(s): FREET3 Anemia work up No results for input(s): VITAMINB12, FOLATE, FERRITIN, TIBC, IRON, RETICCTPCT in the last 72 hours. Urinalysis    Component Value Date/Time   COLORURINE YELLOW 02/03/2016 1424   APPEARANCEUR CLEAR 02/03/2016 1424   LABSPEC 1.023 02/03/2016 1424   PHURINE 5.0 02/03/2016 1424   GLUCOSEU NEGATIVE 02/03/2016 1424   HGBUR NEGATIVE 02/03/2016 1424   BILIRUBINUR NEGATIVE 02/03/2016 1424   KETONESUR NEGATIVE 02/03/2016 1424   PROTEINUR NEGATIVE 02/03/2016 1424   NITRITE NEGATIVE 02/03/2016 1424   LEUKOCYTESUR NEGATIVE 02/03/2016 1424   Sepsis Labs Invalid input(s): PROCALCITONIN,  WBC,  LACTICIDVEN Microbiology Recent Results (from the past 240 hour(s))  Respiratory Panel by PCR     Status: Abnormal   Collection Time: 09/12/16  8:39 AM  Result Value Ref Range Status   Adenovirus NOT DETECTED NOT DETECTED Final   Coronavirus 229E NOT DETECTED NOT DETECTED Final   Coronavirus HKU1 NOT DETECTED NOT DETECTED Final   Coronavirus NL63  NOT DETECTED NOT DETECTED Final   Coronavirus OC43 NOT DETECTED NOT DETECTED Final   Metapneumovirus NOT DETECTED NOT DETECTED Final   Rhinovirus / Enterovirus DETECTED (A) NOT DETECTED Final   Influenza A NOT DETECTED NOT DETECTED Final   Influenza B NOT DETECTED NOT DETECTED Final   Parainfluenza Virus 1 NOT DETECTED NOT DETECTED Final   Parainfluenza Virus 2 NOT DETECTED NOT DETECTED Final   Parainfluenza Virus  3 NOT DETECTED NOT DETECTED Final   Parainfluenza Virus 4 NOT DETECTED NOT DETECTED Final   Respiratory Syncytial Virus DETECTED (A) NOT DETECTED Final    Comment: CRITICAL RESULT CALLED TO, READ BACK BY AND VERIFIED WITH: T UPHAM 09/12/16 @ 35 M VESTAL    Bordetella pertussis NOT DETECTED NOT DETECTED Final   Chlamydophila pneumoniae NOT DETECTED NOT DETECTED Final   Mycoplasma pneumoniae NOT DETECTED NOT DETECTED Final  MRSA PCR Screening     Status: None   Collection Time: 09/12/16 11:06 AM  Result Value Ref Range Status   MRSA by PCR NEGATIVE NEGATIVE Final    Comment:        The GeneXpert MRSA Assay (FDA approved for NASAL specimens only), is one component of a comprehensive MRSA colonization surveillance program. It is not intended to diagnose MRSA infection nor to guide or monitor treatment for MRSA infections.      Time coordinating discharge: Over 30 minutes  SIGNED:  Dessa Phi, DO [facility] Pager [phone]  If 7PM-7AM, please contact night-coverage [email or web address] Password [facility] 09/19/2016, 9:33 AM

## 2016-09-19 NOTE — Discharge Instructions (Signed)
Do not drive while taking the cough medication because it can make you sleepy.   Bronchospasm, Adult A bronchospasm is when the tubes that carry air in and out of your lungs (airways) spasm or tighten. During a bronchospasm it is hard to breathe. This is because the airways get smaller. A bronchospasm can be triggered by:  Allergies. These may be to animals, pollen, food, or mold.  Infection. This is a common cause of bronchospasm.  Exercise.  Irritants. These include pollution, cigarette smoke, strong odors, aerosol sprays, and paint fumes.  Weather changes.  Stress.  Being emotional. Follow these instructions at home:  Always have a plan for getting help. Know when to call your doctor and local emergency services (911 in the [place] Know where you can get emergency care.  Only take medicines as told by your doctor.  If you were prescribed an inhaler or nebulizer machine, ask your doctor how to use it correctly. Always use a spacer with your inhaler if you were given one.  Stay calm during an attack. Try to relax and breathe more slowly.  Control your home environment:  Change your heating and air conditioning filter at least once a month.  Limit your use of fireplaces and wood stoves.  Do not  smoke. Do not  allow smoking in your home.  Avoid perfumes and fragrances.  Get rid of pests (such as roaches and mice) and their droppings.  Throw away plants if you see mold on them.  Keep your house clean and dust free.  Replace carpet with wood, tile, or vinyl flooring. Carpet can trap dander and dust.  Use allergy-proof pillows, mattress covers, and box spring covers.  Wash bed sheets and blankets every week in hot water. Dry them in a dryer.  Use blankets that are made of polyester or cotton.  Wash hands frequently. Contact a doctor if:  You have muscle aches.  You have chest pain.  The thick spit you spit or cough up (sputum) changes from clear or white to  yellow, green, gray, or bloody.  The thick spit you spit or cough up gets thicker.  There are problems that may be related to the medicine you are given such as:  A rash.  Itching.  Swelling.  Trouble breathing. Get help right away if:  You feel you cannot breathe or catch your breath.  You cannot stop coughing.  Your treatment is not helping you breathe better.  You have very bad chest pain. This information is not intended to replace advice given to you by your health care provider. Make sure you discuss any questions you have with your health care provider. Document Released: 07/25/2009 Document Revised: 03/04/2016 Document Reviewed: 03/20/2013 Elsevier Interactive Patient Education  2017 [employer].

## 2016-09-19 NOTE — Progress Notes (Signed)
Patient alert and oriented, denies pain. Iv removed, d/c instruction explain and given to the patient, all questions answered. Pt. Verbalized understanding. D/c pt. Home per order.

## 2016-09-24 ENCOUNTER — Ambulatory Visit: Payer: Self-pay | Attending: Internal Medicine | Admitting: Physician Assistant

## 2016-09-24 VITALS — BP 108/75 | HR 78 | Temp 98.2°F | Resp 16 | Wt 171.2 lb

## 2016-09-24 DIAGNOSIS — Z7984 Long term (current) use of oral hypoglycemic drugs: Secondary | ICD-10-CM | POA: Insufficient documentation

## 2016-09-24 DIAGNOSIS — Z79899 Other long term (current) drug therapy: Secondary | ICD-10-CM | POA: Insufficient documentation

## 2016-09-24 DIAGNOSIS — E1165 Type 2 diabetes mellitus with hyperglycemia: Secondary | ICD-10-CM | POA: Insufficient documentation

## 2016-09-24 DIAGNOSIS — R739 Hyperglycemia, unspecified: Secondary | ICD-10-CM

## 2016-09-24 DIAGNOSIS — J9801 Acute bronchospasm: Secondary | ICD-10-CM

## 2016-09-24 DIAGNOSIS — E119 Type 2 diabetes mellitus without complications: Secondary | ICD-10-CM

## 2016-09-24 DIAGNOSIS — F411 Generalized anxiety disorder: Secondary | ICD-10-CM

## 2016-09-24 DIAGNOSIS — J4551 Severe persistent asthma with (acute) exacerbation: Secondary | ICD-10-CM

## 2016-09-24 LAB — GLUCOSE, POCT (MANUAL RESULT ENTRY): POC Glucose: 191 mg/dl — AB (ref 70–99)

## 2016-09-24 MED ORDER — MOMETASONE FURO-FORMOTEROL FUM 200-5 MCG/ACT IN AERO: 2.0000 | INHALATION_SPRAY | Freq: Two times a day (BID) | RESPIRATORY_TRACT | 0 refills | Status: DC

## 2016-09-24 MED ORDER — CLONAZEPAM 0.5 MG PO TABS: ORAL_TABLET | ORAL | 0 refills | Status: DC

## 2016-09-24 MED ORDER — ALBUTEROL SULFATE (2.5 MG/3ML) 0.083% IN NEBU: 2.5000 mg | INHALATION_SOLUTION | Freq: Four times a day (QID) | RESPIRATORY_TRACT | 12 refills | Status: DC | PRN

## 2016-09-24 MED ORDER — METFORMIN HCL 500 MG PO TABS: 500.0000 mg | ORAL_TABLET | Freq: Two times a day (BID) | ORAL | 1 refills | Status: DC

## 2016-09-24 MED FILL — ?METFORMIN HCL 500MG TABLET: 500 | 30 days supply | Qty: 60 | Fill #0

## 2016-09-24 MED FILL — !DULERA 200 MCG/5 MCG INH: 200-5 | 30 days supply | Qty: 2 | Fill #0

## 2016-09-24 MED FILL — ALBUTEROL 0.083% INHAL SOLN: (2.5 MG/3ML | 7 days supply | Qty: 90 | Fill #0

## 2016-09-24 NOTE — Patient Instructions (Addendum)
Check blood sugars fasting and at bedtime.  Record.  Bring to next visit.  Seek medical attention if blood sugar is >450 or you have signs or symptoms of high blood sugar.

## 2016-09-24 NOTE — Progress Notes (Signed)
Patient ID: Michelle Gibson, female   DOB: Feb 15, 1976, 40 y.o.   MRN: [id number]   Michelle Gibson, is a 40 y.o. female  ENI:[id number]  IRW:[id number]  DOB - 1976-08-10  Subjective:  Chief Complaint and HPI: Michelle Gibson is a 40 y.o. female here today to establish care and for a follow up visit after Admission 09/10/2016-09/19/2016 for RSV and rhinovirus.  She has a h/o asthma and stable WPW s/p ablation years ago that was admitted due to severe SOB, coughing, and wheezing.  R/O PE.  She was treated with IV steroids and breathing treatments.  Pulmonology was consulted.  Hyperglycemia was found in the hospital, and she had a Hgb A1C of 6.3.  She was known to be diabetic and had been off diabetes medications for years because she lost health insurance.  She did not get insulin filled after hospital discharge due to cost.  She has always been controlled on metformin and her A1C in the hospital was only 6.3  Today, she is still feeling short of breath and is still wheezing.  She is still on oral prednisone.  She does not have an inhaler that has steroid in it.  She does have a Proair inhaler.   She is requesting a nebulizer.  Lives with a smoker in the home.  She does not smoke.  Prior to this illness, her asthma was stable w/o any meds.   Denies f/c/hemoptysis/or discolored sputum.  H/o anxiety and was on effexor or wellbutrin a few years ago.  Requesting something like xanax or clonazepam right now while anxiety is exacerbated on meds and due to illness.    ED/Hospital notes reviewed.     ROS:   Constitutional:  No f/c, No night sweats, No unexplained weight loss. EENT:  No vision changes, No blurry vision, No hearing changes. No mouth, throat, or ear problems.  Respiratory: +wheezing, cough, and SOB Cardiac: No CP, no palpitations GI:  No abd pain, No N/V/D. GU: No Urinary s/sx Musculoskeletal: No joint pain Neuro: No headache, no dizziness, no motor weakness.  Skin: No rash Endocrine:  No  polydipsia. No polyuria.  Psych: Denies SI/HI  No problems updated.  ALLERGIES: Allergies  Allergen Reactions  . Ciprofloxacin Hives    PAST MEDICAL HISTORY: Past Medical History:  Diagnosis Date  . Asthma   . WPW (Wolff-Parkinson-White syndrome)     MEDICATIONS AT HOME: Prior to Admission medications   Medication Sig Start Date End Date Taking? Authorizing Provider  albuterol (PROVENTIL HFA;VENTOLIN HFA) 108 (90 Base) MCG/ACT inhaler Inhale 2 puffs into the lungs every 4 (four) hours as needed for wheezing or shortness of breath. 09/19/16   Jennifer Chahn-Yang Choi, DO  albuterol (PROVENTIL) (2.5 MG/3ML) 0.083% nebulizer solution Take 3 mLs (2.5 mg total) by nebulization every 6 (six) hours as needed for wheezing or shortness of breath. 09/24/16   Argentina Donovan, PA-C  blood glucose meter kit and supplies KIT Dispense based on patient and insurance preference. Use up to four times daily as directed. (FOR ICD-9 250.00, 250.01). 09/19/16   Rich Fuchs Choi, DO  budesonide (PULMICORT) 0.5 MG/2ML nebulizer solution Take 2 mLs (0.5 mg total) by nebulization 2 (two) times daily. 09/19/16 10/19/16  Jennifer Chahn-Yang Choi, DO  clonazePAM (KLONOPIN) 0.5 MG tablet 1/2-1 bid prn anxiety 09/24/16   Argentina Donovan, PA-C  fluticasone [facility]) 50 MCG/ACT nasal spray Place 2 sprays into both nostrils daily. 09/19/16   Jennifer Chahn-Yang Choi, DO  guaiFENesin-dextromethorphan (ROBITUSSIN DM) 100-10 MG/5ML syrup  Take 5 mLs by mouth every 4 (four) hours as needed for cough. 09/19/16 10/19/16  Rich Fuchs Choi, DO  metFORMIN (GLUCOPHAGE) 500 MG tablet Take 1 tablet (500 mg total) by mouth 2 (two) times daily with a meal. 09/24/16   Argentina Donovan, PA-C  mometasone-formoterol (DULERA) 200-5 MCG/ACT AERO Inhale 2 puffs into the lungs 2 (two) times daily. 09/24/16   Argentina Donovan, PA-C  pantoprazole (PROTONIX) 40 MG tablet Take 1 tablet (40 mg total) by mouth 2 (two) times daily  before a meal. 09/19/16 10/19/16  Jennifer Chahn-Yang Choi, DO  predniSONE (DELTASONE) 10 MG tablet Take 4 tabs for 3 days, then 3 tabs for 3 days, then 2 tabs for 3 days, then 1 tab for 3 days, then 1/2 tab for 4 days. 09/19/16   Shon Millet, DO     Objective:  EXAM:   Vitals:   09/24/16 1219  BP: 108/75  Pulse: 78  Resp: 16  Temp: 98.2 F (36.8 C)  TempSrc: Oral  SpO2: 98%  Weight: 171 lb 3.2 oz (77.7 kg)    General appearance : A&OX3. NAD. Non-toxic-appearing HEENT: Atraumatic and Normocephalic.  PERRLA. EOM intact.  TM clear B. Mouth-MMM, post pharynx WNL w/o erythema, No PND. Neck: supple, no JVD. No cervical lymphadenopathy. No thyromegaly Chest/Lungs:  Breathing-non-labored, Good air entry bilaterally, breath sounds normal without rales, rhonchi, or wheezing  CVS: S1 S2 regular, no murmurs, gallops, rubs  Abdomen: Bowel sounds present, Non tender and not distended with no gaurding, rigidity or rebound. Extremities: Bilateral Lower Ext shows no edema, both legs are warm to touch with = pulse throughout Neurology:  CN II-XII grossly intact, Non focal.   Psych:  TP linear. J/I WNL. Normal speech. Appropriate eye contact and affect.  Skin:  No Rash  Data Review Lab Results  Component Value Date   HGBA1C 6.3 (H) 09/15/2016     Assessment & Plan   1. Severe persistent asthma with acute exacerbation Recent RSV and rhinovirus with admission add- mometasone-formoterol (DULERA) 200-5 MCG/ACT AERO; Inhale 2 puffs into the lungs 2 (two) times daily.  Dispense: 1 Inhaler; Refill: 0 - albuterol (PROVENTIL) (2.5 MG/3ML) 0.083% nebulizer solution; Take 3 mLs (2.5 mg total) by nebulization every 6 (six) hours as needed for wheezing or shortness of breath.  Dispense: 75 mL; Refill: 12 Finish prednisone. No one to smoke in the house hold!!  To ED/call 911 if any respiratory distress.  2. Hyperglycemia/diabetes-previously controlled on metformin and A1C=6.3 - POCT  glucose (manual entry) - metFORMIN (GLUCOPHAGE) 500 MG tablet; Take 1 tablet (500 mg total) by mouth 2 (two) times daily with a meal.  Dispense: 180 tablet; Refill: 1 Check blood sugars fasting and at bedtime.  Record.  Bring to next visit.  Seek medical attention if blood sugar is >450 or you have signs or symptoms of high blood sugar. (On prednisone    3. Anxiety state Worsened currently due to prednisone and inhalers.  For now-use sparingly. - clonazePAM (KLONOPIN) 0.5 MG tablet; 1/2-1 bid prn anxiety  Dispense: 20 tablet; Refill: 0 Consider resarting Wellbutrin or effexor, etc that she was on previously.  Will discuss further at next OV.     Patient have been counseled extensively about nutrition and exercise  Return in about 1 week (around 10/01/2016) for with me for breathing and blood sugar.  The patient was given clear instructions to go to ER or return to medical center if symptoms don't improve, worsen or new problems  develop. The patient verbalized understanding. The patient was told to call to get lab results if they haven't heard anything in the next week.     Freeman Caldron, PA-C [facility] and [facility] [place], [place]   09/24/2016, 1:50 PM

## 2016-09-30 ENCOUNTER — Ambulatory Visit: Payer: Self-pay | Attending: Internal Medicine

## 2016-10-07 ENCOUNTER — Telehealth: Payer: Self-pay | Admitting: Family Medicine

## 2016-10-07 ENCOUNTER — Ambulatory Visit: Payer: Self-pay | Attending: Family Medicine | Admitting: Family Medicine

## 2016-10-07 ENCOUNTER — Encounter: Payer: Self-pay | Admitting: Licensed Clinical Social Worker

## 2016-10-07 ENCOUNTER — Ambulatory Visit (HOSPITAL_COMMUNITY)
Admission: RE | Admit: 2016-10-07 | Discharge: 2016-10-07 | Disposition: A | Discharge status: Home / Self Care | Payer: Self-pay | Source: Ambulatory Visit | Attending: Family Medicine | Admitting: Family Medicine

## 2016-10-07 ENCOUNTER — Encounter: Payer: Self-pay | Admitting: Family Medicine

## 2016-10-07 VITALS — BP 120/83 | HR 82 | Temp 98.1°F | Resp 20 | Wt 171.0 lb

## 2016-10-07 DIAGNOSIS — R05 Cough: Secondary | ICD-10-CM | POA: Insufficient documentation

## 2016-10-07 DIAGNOSIS — J45901 Unspecified asthma with (acute) exacerbation: Secondary | ICD-10-CM | POA: Insufficient documentation

## 2016-10-07 DIAGNOSIS — R0602 Shortness of breath: Secondary | ICD-10-CM | POA: Insufficient documentation

## 2016-10-07 DIAGNOSIS — R739 Hyperglycemia, unspecified: Secondary | ICD-10-CM | POA: Insufficient documentation

## 2016-10-07 DIAGNOSIS — R059 Cough, unspecified: Secondary | ICD-10-CM

## 2016-10-07 DIAGNOSIS — Z7984 Long term (current) use of oral hypoglycemic drugs: Secondary | ICD-10-CM | POA: Insufficient documentation

## 2016-10-07 DIAGNOSIS — J4551 Severe persistent asthma with (acute) exacerbation: Secondary | ICD-10-CM

## 2016-10-07 DIAGNOSIS — Z79899 Other long term (current) drug therapy: Secondary | ICD-10-CM | POA: Insufficient documentation

## 2016-10-07 LAB — CBC WITH DIFFERENTIAL/PLATELET
Basophils Absolute: 65 cells/uL (ref 0–200)
Basophils Relative: 1 %
Eosinophils Absolute: 195 cells/uL (ref 15–500)
Eosinophils Relative: 3 %
HEMATOCRIT: 42.3 % (ref 35.0–45.0)
Hemoglobin: 14.4 g/dL (ref 11.7–15.5)
LYMPHS PCT: 37 %
Lymphs Abs: 2405 cells/uL (ref 850–3900)
MCH: 30.6 pg (ref 27.0–33.0)
MCHC: 34 g/dL (ref 32.0–36.0)
MCV: 90 fL (ref 80.0–100.0)
MONO ABS: 390 {cells}/uL (ref 200–950)
MPV: 8.8 fL (ref 7.5–12.5)
Monocytes Relative: 6 %
Neutro Abs: 3445 cells/uL (ref 1500–7800)
Neutrophils Relative %: 53 %
PLATELETS: 383 10*3/uL (ref 140–400)
RBC: 4.7 MIL/uL (ref 3.80–5.10)
RDW: 14.8 % (ref 11.0–15.0)
WBC: 6.5 10*3/uL (ref 3.8–10.8)

## 2016-10-07 LAB — BASIC METABOLIC PANEL
BUN: 24 mg/dL (ref 7–25)
CO2: 27 mmol/L (ref 20–31)
Calcium: 9.8 mg/dL (ref 8.6–10.2)
Chloride: 105 mmol/L (ref 98–110)
Creat: 1 mg/dL (ref 0.50–1.10)
Glucose, Bld: 123 mg/dL — ABNORMAL HIGH (ref 65–99)
POTASSIUM: 4.4 mmol/L (ref 3.5–5.3)
SODIUM: 142 mmol/L (ref 135–146)

## 2016-10-07 LAB — GLUCOSE, POCT (MANUAL RESULT ENTRY): POC Glucose: 139 mg/dl — AB (ref 70–99)

## 2016-10-07 MED ORDER — IPRATROPIUM-ALBUTEROL 0.5-2.5 (3) MG/3ML IN SOLN: 3.0000 mL | RESPIRATORY_TRACT | Status: DC | PRN

## 2016-10-07 MED ORDER — GUAIFENESIN ER 600 MG PO TB12: 600.0000 mg | ORAL_TABLET | Freq: Two times a day (BID) | ORAL | 0 refills | Status: AC

## 2016-10-07 MED ORDER — DOXYCYCLINE HYCLATE 100 MG PO TABS: 100.0000 mg | ORAL_TABLET | Freq: Two times a day (BID) | ORAL | 0 refills | Status: AC

## 2016-10-07 MED ORDER — IPRATROPIUM-ALBUTEROL 0.5-2.5 (3) MG/3ML IN SOLN: 3.0000 mL | Freq: Four times a day (QID) | RESPIRATORY_TRACT | 0 refills | Status: DC | PRN

## 2016-10-07 MED FILL — IPRAT-ALBUT 0.5-3(2.5) MG/3: 0.5-2.5 (3) | 30 days supply | Qty: 360 | Fill #0

## 2016-10-07 MED FILL — ?DOXYCYCLINE MONO 100 MG TA: 100 | 7 days supply | Qty: 14 | Fill #0

## 2016-10-07 NOTE — Progress Notes (Signed)
Complaining of cough/sob. Home Nebulizer treatment not helping. CBG:139.

## 2016-10-07 NOTE — Telephone Encounter (Signed)
Error

## 2016-10-07 NOTE — Progress Notes (Signed)
Subjective:  Patient ID: Michelle Gibson, female    DOB: 1976/01/24  Age: 40 y.o. MRN: [id number]  CC: No chief complaint on file.   HPI Michelle Gibson presents for complaints of productive cough and SOB. She reports started since her last hospitalization on 12/10. She reports sputum initially was green in color then became white and thin. She denies any fever. She does report wheezing throughout the day. She reports scratchy throat.She reports she is not a smoker. She reports recent use of pulmicort nebulizer treatment at home with very minimal relief.   Outpatient Medications Prior to Visit  Medication Sig Dispense Refill  . albuterol (PROVENTIL HFA;VENTOLIN HFA) 108 (90 Base) MCG/ACT inhaler Inhale 2 puffs into the lungs every 4 (four) hours as needed for wheezing or shortness of breath. 1 Inhaler 0  . albuterol (PROVENTIL) (2.5 MG/3ML) 0.083% nebulizer solution Take 3 mLs (2.5 mg total) by nebulization every 6 (six) hours as needed for wheezing or shortness of breath. 75 mL 12  . budesonide (PULMICORT) 0.5 MG/2ML nebulizer solution Take 2 mLs (0.5 mg total) by nebulization 2 (two) times daily. 120 mL 0  . clonazePAM (KLONOPIN) 0.5 MG tablet 1/2-1 bid prn anxiety 20 tablet 0  . fluticasone (FLONASE) 50 MCG/ACT nasal spray Place 2 sprays into both nostrils daily. 16 g 0  . guaiFENesin-dextromethorphan (ROBITUSSIN DM) 100-10 MG/5ML syrup Take 5 mLs by mouth every 4 (four) hours as needed for cough. 118 mL 0  . metFORMIN (GLUCOPHAGE) 500 MG tablet Take 1 tablet (500 mg total) by mouth 2 (two) times daily with a meal. 180 tablet 1  . mometasone-formoterol (DULERA) 200-5 MCG/ACT AERO Inhale 2 puffs into the lungs 2 (two) times daily. 1 Inhaler 0  . pantoprazole (PROTONIX) 40 MG tablet Take 1 tablet (40 mg total) by mouth 2 (two) times daily before a meal. 60 tablet 0  . blood glucose meter kit and supplies KIT Dispense based on patient and insurance preference. Use up to four times daily as directed.  (FOR ICD-9 250.00, 250.01). 1 each 0  . predniSONE (DELTASONE) 10 MG tablet Take 4 tabs for 3 days, then 3 tabs for 3 days, then 2 tabs for 3 days, then 1 tab for 3 days, then 1/2 tab for 4 days. 32 tablet 0   No facility-administered medications prior to visit.     ROS Review of Systems  Constitutional: Negative for fever.  HENT: Negative for sinus pressure and trouble swallowing.   Respiratory: Positive for chest tightness, shortness of breath and wheezing.   Cardiovascular: Negative.   Gastrointestinal: Negative.    Objective:  BP 120/83   Pulse 82   Temp 98.1 F (36.7 C) (Oral)   Resp 20   Wt 171 lb (77.6 kg)   SpO2 97%   BMI 28.46 kg/m   BP/Weight 10/07/2016 09/24/2016 77/93/9030  Systolic BP 092 330 076  Diastolic BP 83 75 65  Wt. (Lbs) 171 171.2 168.2  BMI 28.46 28.49 27.99   Physical Exam  Constitutional: She is oriented to person, place, and time. She has a sickly appearance.  HENT:  Nose: Rhinorrhea (clear nasal drainage) present.  Mouth/Throat: Uvula is midline, oropharynx is clear and moist and mucous membranes are normal. No oropharyngeal exudate.  Eyes: Conjunctivae are normal. Right eye exhibits no discharge. Left eye exhibits no discharge.  Cardiovascular: Normal rate, regular rhythm, normal heart sounds and intact distal pulses.   Pulmonary/Chest: Effort normal and breath sounds normal. No respiratory distress. She has no  wheezes.  Abdominal: Soft. Bowel sounds are normal. She exhibits no distension. There is no tenderness.  Neurological: She is alert and oriented to person, place, and time.  Skin: Skin is warm and dry.   Assessment & Plan:   Problem List Items Addressed This Visit      Respiratory   Asthma with acute exacerbation   Relevant Medications   ipratropium-albuterol (DUONEB) 0.5-2.5 (3) MG/3ML nebulizer solution 3 mL   ipratropium-albuterol (DUONEB) 0.5-2.5 (3) MG/3ML SOLN     Other   Hyperglycemia - Primary   Relevant Orders   POCT  glucose (manual entry) (Completed)    Other Visit Diagnoses    Shortness of breath       Relevant Orders   DG Chest 2 View (Completed)   Basic Metabolic Panel (Completed)   CBC with Differential (Completed)   Respiratory virus panel   Cough       Relevant Medications   doxycycline (VIBRA-TABS) 100 MG tablet   guaiFENesin (MUCINEX) 600 MG 12 hr tablet   Other Relevant Orders   DG Chest 2 View (Completed)   Basic Metabolic Panel (Completed)   CBC with Differential (Completed)   Respiratory virus panel      Meds ordered this encounter  Medications  . ipratropium-albuterol (DUONEB) 0.5-2.5 (3) MG/3ML nebulizer solution 3 mL  . doxycycline (VIBRA-TABS) 100 MG tablet    Sig: Take 1 tablet (100 mg total) by mouth 2 (two) times daily.    Dispense:  14 tablet    Refill:  0    Order Specific Question:   Supervising Provider    Answer:   Tresa Garter [id number]  . guaiFENesin (MUCINEX) 600 MG 12 hr tablet    Sig: Take 1 tablet (600 mg total) by mouth 2 (two) times daily.    Dispense:  14 tablet    Refill:  0    Order Specific Question:   Supervising Provider    Answer:   Tresa Garter [id number]  . ipratropium-albuterol (DUONEB) 0.5-2.5 (3) MG/3ML SOLN    Sig: Take 3 mLs by nebulization every 6 (six) hours as needed.    Dispense:  360 mL    Refill:  0    Order Specific Question:   Supervising Provider    Answer:   Tresa Garter [id number]    Follow-up: Return if symptoms worsen or fail to improve.   Alfonse Spruce FNP

## 2016-10-07 NOTE — Patient Instructions (Signed)
Asthma, Adult Asthma is a condition of the lungs in which the airways tighten and narrow. Asthma can make it hard to breathe. Asthma cannot be cured, but medicine and lifestyle changes can help control it. Asthma may be started (triggered) by:  Animal skin flakes (dander).  Dust.  Cockroaches.  Pollen.  Mold.  Smoke.  Cleaning products.  Hair sprays or aerosol sprays.  Paint fumes or strong smells.  Cold air, weather changes, and winds.  Crying or laughing hard.  Stress.  Certain medicines or drugs.  Foods, such as dried fruit, potato chips, and sparkling grape juice.  Infections or conditions (colds, flu).  Exercise.  Certain medical conditions or diseases.  Exercise or tiring activities. Follow these instructions at home:  Take medicine as told by your doctor.  Use a peak flow meter as told by your doctor. A peak flow meter is a tool that measures how well the lungs are working.  Record and keep track of the peak flow meter's readings.  Understand and use the asthma action plan. An asthma action plan is a written plan for taking care of your asthma and treating your attacks.  To help prevent asthma attacks:  Do not smoke. Stay away from secondhand smoke.  Change your heating and air conditioning filter often.  Limit your use of fireplaces and wood stoves.  Get rid of pests (such as roaches and mice) and their droppings.  Throw away plants if you see mold on them.  Clean your floors. Dust regularly. Use cleaning products that do not smell.  Have someone vacuum when you are not home. Use a vacuum cleaner with a HEPA filter if possible.  Replace carpet with wood, tile, or vinyl flooring. Carpet can trap animal skin flakes and dust.  Use allergy-proof pillows, mattress covers, and box spring covers.  Wash bed sheets and blankets every week in hot water and dry them in a dryer.  Use blankets that are made of polyester or cotton.  Clean bathrooms  and kitchens with bleach. If possible, have someone repaint the walls in these rooms with mold-resistant paint. Keep out of the rooms that are being cleaned and painted.  Wash hands often. Contact a doctor if:  You have make a whistling sound when breaking (wheeze), have shortness of breath, or have a cough even if taking medicine to prevent attacks.  The colored mucus you cough up (sputum) is thicker than usual.  The colored mucus you cough up changes from clear or white to yellow, green, gray, or bloody.  You have problems from the medicine you are taking such as:  A rash.  Itching.  Swelling.  Trouble breathing.  You need reliever medicines more than 2-3 times a week.  Your peak flow measurement is still at 50-79% of your personal best after following the action plan for 1 hour.  You have a fever. Get help right away if:  You seem to be worse and are not responding to medicine during an asthma attack.  You are short of breath even at rest.  You get short of breath when doing very little activity.  You have trouble eating, drinking, or talking.  You have chest pain.  You have a fast heartbeat.  Your lips or fingernails start to turn blue.  You are light-headed, dizzy, or faint.  Your peak flow is less than 50% of your personal best. This information is not intended to replace advice given to you by your health care provider. Make sure  you discuss any questions you have with your health care provider. Document Released: 03/15/2008 Document Revised: 03/04/2016 Document Reviewed: 04/26/2013 Elsevier Interactive Patient Education  2017 [place] of Breath Shortness of breath means you have trouble breathing. Shortness of breath needs medical care right away. HOME CARE   Do not smoke.  Avoid being around chemicals or things (paint fumes, dust) that may bother your breathing.  Rest as needed. Slowly begin your normal activities.  Only take  medicines as told by your doctor.  Keep all doctor visits as told. GET HELP RIGHT AWAY IF:   Your shortness of breath gets worse.  You feel lightheaded, pass out (faint), or have a cough that is not helped by medicine.  You cough up blood.  You have pain with breathing.  You have pain in your chest, arms, shoulders, or belly (abdomen).  You have a fever.  You cannot walk up stairs or exercise the way you normally do.  You do not get better in the time expected.  You have a hard time doing normal activities even with rest.  You have problems with your medicines.  You have any new symptoms. MAKE SURE YOU:  Understand these instructions.  Will watch your condition.  Will get help right away if you are not doing well or get worse. This information is not intended to replace advice given to you by your health care provider. Make sure you discuss any questions you have with your health care provider. Document Released: 03/15/2008 Document Revised: 10/02/2013 Document Reviewed: 12/13/2011 Elsevier Interactive Patient Education  2017 [place]. Doxycycline tablets or capsules What is this medicine? DOXYCYCLINE (dox i SYE kleen) is a tetracycline antibiotic. It kills certain bacteria or stops their growth. It is used to treat many kinds of infections, like dental, skin, respiratory, and urinary tract infections. It also treats acne, Lyme disease, malaria, and certain sexually transmitted infections. This medicine may be used for other purposes; ask your health care provider or pharmacist if you have questions. COMMON BRAND NAME(S): Acticlate, Adoxa, Adoxa CK, Adoxa Pak, Adoxa TT, Alodox, Avidoxy, Doxal, Mondoxyne NL, Monodox, Morgidox 1x, Morgidox 1x Kit, Morgidox 2x, Morgidox 2x Kit, NutriDox, Ocudox, TARGADOX, Vibra-Tabs, Vibramycin What should I tell my health care provider before I take this medicine? They need to know if you have any of these conditions: -liver  disease -long exposure to sunlight like working outdoors -stomach problems like colitis -an unusual or allergic reaction to doxycycline, tetracycline antibiotics, other medicines, foods, dyes, or preservatives -pregnant or trying to get pregnant -breast-feeding How should I use this medicine? Take this medicine by mouth with a full glass of water. Follow the directions on the prescription label. It is best to take this medicine without food, but if it upsets your stomach take it with food. Take your medicine at regular intervals. Do not take your medicine more often than directed. Take all of your medicine as directed even if you think you are better. Do not skip doses or stop your medicine early. Talk to your pediatrician regarding the use of this medicine in children. While this drug may be prescribed for selected conditions, precautions do apply. Overdosage: If you think you have taken too much of this medicine contact a poison control center or emergency room at once. NOTE: This medicine is only for you. Do not share this medicine with others. What if I miss a dose? If you miss a dose, take it as soon as you can. If it  is almost time for your next dose, take only that dose. Do not take double or extra doses. What may interact with this medicine? -antacids -barbiturates -birth control pills -bismuth subsalicylate -carbamazepine -methoxyflurane -other antibiotics -phenytoin -vitamins that contain iron -warfarin This list may not describe all possible interactions. Give your health care provider a list of all the medicines, herbs, non-prescription drugs, or dietary supplements you use. Also tell them if you smoke, drink alcohol, or use illegal drugs. Some items may interact with your medicine. What should I watch for while using this medicine? Tell your doctor or health care professional if your symptoms do not improve. Do not treat diarrhea with over the counter products. Contact your  doctor if you have diarrhea that lasts more than 2 days or if it is severe and watery. Do not take this medicine just before going to bed. It may not dissolve properly when you lay down and can cause pain in your throat. Drink plenty of fluids while taking this medicine to also help reduce irritation in your throat. This medicine can make you more sensitive to the sun. Keep out of the sun. If you cannot avoid being in the sun, wear protective clothing and use sunscreen. Do not use sun lamps or tanning beds/booths. Birth control pills may not work properly while you are taking this medicine. Talk to your doctor about using an extra method of birth control. If you are being treated for a sexually transmitted infection, avoid sexual contact until you have finished your treatment. Your sexual partner may also need treatment. Avoid antacids, aluminum, calcium, magnesium, and iron products for 4 hours before and 2 hours after taking a dose of this medicine. If you are using this medicine to prevent malaria, you should still protect yourself from contact with mosquitos. Stay in screened-in areas, use mosquito nets, keep your body covered, and use an insect repellent. What side effects may I notice from receiving this medicine? Side effects that you should report to your doctor or health care professional as soon as possible: -allergic reactions like skin rash, itching or hives, swelling of the face, lips, or tongue -difficulty breathing -fever -itching in the rectal or genital area -pain on swallowing -redness, blistering, peeling or loosening of the skin, including inside the mouth -severe stomach pain or cramps -unusual bleeding or bruising -unusually weak or tired -yellowing of the eyes or skin Side effects that usually do not require medical attention (report to your doctor or health care professional if they continue or are bothersome): -diarrhea -loss of appetite -nausea, vomiting This list may  not describe all possible side effects. Call your doctor for medical advice about side effects. You may report side effects to FDA at 1-800-FDA-1088. Where should I keep my medicine? Keep out of the reach of children. Store at room temperature, below 30 degrees C (86 degrees F). Protect from light. Keep container tightly closed. Throw away any unused medicine after the expiration date. Taking this medicine after the expiration date can make you seriously ill. NOTE: This sheet is a summary. It may not cover all possible information. If you have questions about this medicine, talk to your doctor, pharmacist, or health care provider.  2017 Elsevier/Gold Standard (2015-10-29 17:11:22)  How to Use a Nebulizer, Adult A nebulizer is a device that turns liquid medicine into a mist (vapor) that you can breathe in (inhale). You may need to use a nebulizer if you have a breathing illness, such as asthma or pneumonia. There  are different kinds of nebulizers. With some, you breathe in through a mouthpiece. With others, a mask fits over your nose and mouth. Risks and complications Using a nebulizer that does not fit right or is not cleaned right can lead to the following complications:  Infection.  Eye irritation.  Delivery of too much medicine or not enough medicine.  Mouth irritation. How to prepare before using a nebulizer Take these steps before using your nebulizer: 1. Check your medicine. Make sure it has not expired and is not damaged in any way. 2. Wash your hands with soap and water. 3. Put all of the parts of your nebulizer on a sturdy, flat surface. Make sure all of the tubing is connected. 4. Measure the liquid medicine according to instructions from your health care provider. Pour the liquid into the part of the nebulizer that holds the medicine (reservoir). 5. Attach the mouthpiece or mask. 6. Test the nebulizer by turning it on to make sure that a spray comes out. Then, turn it off. How  to use a nebulizer 1. Sit down and relax. 2. If your nebulizer has a mask, put it over your nose and mouth. It should fit somewhat snugly, with no gaps around the nose or cheeks where medicine could escape. If you use a mouthpiece, put it in your mouth. Press your lips firmly around the mouthpiece. 3. Turn on the nebulizer. 4. Breathe out (exhale). 5. Some nebulizers have a finger valve. If yours does, cover up the air hole so the air gets to the nebulizer. 6. Once the medicine begins to mist out, take slow, deep breaths. If there is a finger valve, release it at the end of your breath. 7. Continue taking slow, deep breaths until the medicine in the nebulizer is gone and no vapor appears. Be sure to stop the machine at any time if you start coughing or if the medicine foams or bubbles. How to clean a nebulizer The nebulizer and all of its parts must be kept very clean. If the nebulizer and its parts are not cleaned properly, bacteria can grow inside of them. If you inhale the bacteria, you can get sick. Follow the manufacturer's instructions for cleaning your nebulizer. For most nebulizers, you should follow these guidelines:  Wash the nebulizer after each use. Use warm water and soap. Make sure to wash the mouthpiece or mask and the medicine area, but do not wash the tubing or mouthpiece.  After you wash the nebulizer, place its parts on a clean towel and let them dry completely. After they dry, reconnect the pieces and turn the nebulizer on without any medicine in it. Doing this will blow air through the equipment to help dry it out.  Store the nebulizer in a clean and dust-free place.  Check the filter at least one time every week. Replace it if it looks dirty. Contact a health care provider if:  You continue to have trouble breathing.  You have trouble using the nebulizer.  Your breathing gets worse during a nebulizer treatment.  Your nebulizer stops working, foams, or does not create  a mist after you add medicine and turn it on. This information is not intended to replace advice given to you by your health care provider. Make sure you discuss any questions you have with your health care provider. Document Released: 09/15/2009 Document Revised: 05/27/2016 Document Reviewed: 04/03/2016 Elsevier Interactive Patient Education  2017 [employer].

## 2016-10-07 NOTE — BH Specialist Note (Signed)
Session Start time: 2:50 pm   End Time: 3:10 pm Total Time:  20 minutes Type of Service: [place]: No.   Interpreter Name & Language: N/A # [facility] Visits July 2017-June 2018: 1st   SUBJECTIVE: Michelle Gibson is a 40 y.o. female  Pt. was referred by FNP [place] for:  anxiety and depression. Pt. 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   OBJECTIVE: Mood: Anxious & Affect: Appropriate Risk of harm to self or others: Pt reported hx of self-harming behavior. She denies SI/HI Assessments administered: PHQ-9; GAD-7  LIFE CONTEXT:  Family & Social: Pt resides with her three children. She has friends who reside nearby, no extended family  School/ Work: Pt is employed in [employer]; however, feels she may not return due to working with strong chemicals that negatively impact her health. She has applied for disability and receives no additional benefits Self-Care: Pt enjoys hiking; however, has had to discontinue due to physical health. She reports difficulty sleeping and decreased appetite resulting in a forty pound weight loss in six weeks. Pt drinks alcohol (1 glass of wine) daily. Life changes: Pt re-located to [place], [place] from [place]. 1 year ago with family for better opportunities. She was recently hospitalized for nine days due to medical concerns What is important to pt/family (values): Family, Good Health   GOALS ADDRESSED:  Decrease symptoms of depression Decrease symptoms of anxiety  INTERVENTIONS: Solution Focused, Strength-based and Supportive   ASSESSMENT:  Pt currently experiencing depression and anxiety triggered by recent hospitalizations due to declining health. Pt  reports panic attacks, difficulty sleeping, racing thoughts, and overwhelming feelings of worry. Pt may benefit from psychoeducation, psychotherapy, and medication management. LCSWA educated pt on the cycle of depression and the importance of implementing healthy coping strategies to decrease symptoms. Pt identified coping skills to utilize on a daily basis, in addition, to complying with prescribed medications. LCSWA encouraged pt to initiate therapy and provided community resources for therapy, medication management, and crisis interventions. Pt plans to schedule appointment with Financial Counseling to assist with financial strain. LCSWA provided pt with labor resources, in the case she does not return to current provider.     PLAN: 1. F/U with behavioral health clinician: Pt was encouraged to contact LCSWA if symptoms worsen or fail to improve to schedule behavioral appointments at [facility]. 2. Behavioral Health meds: Klonopin 3. Behavioral recommendations: LCSWA recommends that pt apply healthy coping skills discussed, schedule appointment with Development worker, community, and utilize community resources. Pt is encouraged to schedule follow up appointment with LCSWA 4. Referral: Brief Counseling/Psychotherapy, [employer], Problem-solving teaching/coping strategies, Psychoeducation and Supportive Counseling 5. From scale of 1-10, how likely are you to follow plan: [place], MSW, [place] Worker 10/07/16 4:24 PM  Warmhandoff:   Warm Hand Off Completed.

## 2016-10-08 ENCOUNTER — Encounter: Payer: Self-pay | Admitting: Physician Assistant

## 2016-10-08 ENCOUNTER — Encounter: Payer: Self-pay | Admitting: Family Medicine

## 2016-10-08 LAB — RESPIRATORY VIRUS PANEL
Adenovirus B: NOT DETECTED
Influenza A H1: NOT DETECTED
Influenza A H3: NOT DETECTED
Influenza A: NOT DETECTED
Influenza B: NOT DETECTED
Metapneumovirus: NOT DETECTED
Parainfluenza 1: NOT DETECTED
Parainfluenza 2: NOT DETECTED
Parainfluenza 3: NOT DETECTED
Respiratory Syncytial Virus A: NOT DETECTED
Respiratory Syncytial Virus B: NOT DETECTED
Rhinovirus: NOT DETECTED

## 2016-10-08 NOTE — Telephone Encounter (Signed)
Patient is complaining of difficulty breathing and her chest being "on fire" with no sleep. Please advise?

## 2016-10-08 NOTE — Telephone Encounter (Signed)
Called and spoke with patient.Patient was advised to go to the ED due to symptoms being unrelieved.

## 2016-10-15 ENCOUNTER — Telehealth: Payer: Self-pay | Admitting: *Deleted

## 2016-10-15 NOTE — Telephone Encounter (Signed)
Medical Assistant left message on patient's home and cell voicemail. Voicemail states to give a call back to Yossi Hinchman with [facility] at [phone].  

## 2016-10-15 NOTE — Telephone Encounter (Signed)
-----   Message from Alfonse Spruce, [place] sent at 10/15/2016  8:36 AM EST ----- -Respiratory virus panel was negative. -Labs normal -Chest xray showed decreased inspiration. No active lung disease.  -Per our phone conversation on 12/29 patient was advised to go to the ED.

## 2016-10-18 ENCOUNTER — Other Ambulatory Visit: Payer: Self-pay | Admitting: *Deleted

## 2016-10-18 DIAGNOSIS — J4551 Severe persistent asthma with (acute) exacerbation: Secondary | ICD-10-CM

## 2016-10-18 MED ORDER — MOMETASONE FURO-FORMOTEROL FUM 200-5 MCG/ACT IN AERO: 2.0000 | INHALATION_SPRAY | Freq: Two times a day (BID) | RESPIRATORY_TRACT | 3 refills | Status: DC

## 2016-10-18 MED ORDER — ALBUTEROL SULFATE HFA 108 (90 BASE) MCG/ACT IN AERS: 2.0000 | INHALATION_SPRAY | RESPIRATORY_TRACT | 3 refills | Status: DC | PRN

## 2016-10-18 NOTE — Telephone Encounter (Signed)
PRINTED FOR PASS PROGRAM 

## 2016-10-20 ENCOUNTER — Encounter: Payer: Self-pay | Admitting: *Deleted

## 2016-10-20 ENCOUNTER — Ambulatory Visit: Payer: Self-pay | Attending: Family Medicine | Admitting: Family Medicine

## 2016-10-20 ENCOUNTER — Encounter: Payer: Self-pay | Admitting: Family Medicine

## 2016-10-20 VITALS — BP 92/68 | HR 100 | Temp 98.6°F | Resp 18 | Ht 67.0 in | Wt 172.6 lb

## 2016-10-20 DIAGNOSIS — R202 Paresthesia of skin: Secondary | ICD-10-CM

## 2016-10-20 DIAGNOSIS — R042 Hemoptysis: Secondary | ICD-10-CM | POA: Insufficient documentation

## 2016-10-20 DIAGNOSIS — R0789 Other chest pain: Secondary | ICD-10-CM | POA: Insufficient documentation

## 2016-10-20 DIAGNOSIS — E1165 Type 2 diabetes mellitus with hyperglycemia: Secondary | ICD-10-CM | POA: Insufficient documentation

## 2016-10-20 DIAGNOSIS — Z7984 Long term (current) use of oral hypoglycemic drugs: Secondary | ICD-10-CM | POA: Insufficient documentation

## 2016-10-20 DIAGNOSIS — R200 Anesthesia of skin: Secondary | ICD-10-CM | POA: Insufficient documentation

## 2016-10-20 DIAGNOSIS — R739 Hyperglycemia, unspecified: Secondary | ICD-10-CM

## 2016-10-20 DIAGNOSIS — Z79899 Other long term (current) drug therapy: Secondary | ICD-10-CM | POA: Insufficient documentation

## 2016-10-20 DIAGNOSIS — R454 Irritability and anger: Secondary | ICD-10-CM | POA: Insufficient documentation

## 2016-10-20 LAB — GLUCOSE, POCT (MANUAL RESULT ENTRY): POC Glucose: 84 mg/dl (ref 70–99)

## 2016-10-20 MED ORDER — GABAPENTIN 100 MG PO CAPS: 100.0000 mg | ORAL_CAPSULE | Freq: Three times a day (TID) | ORAL | 2 refills | Status: DC

## 2016-10-20 MED ORDER — PREDNISONE 10 MG (21) PO TBPK: 10.0000 mg | ORAL_TABLET | Freq: Every day | ORAL | 0 refills | Status: DC

## 2016-10-20 NOTE — Progress Notes (Signed)
Patient is here for shortness of breath  complaining  about pain in her lungs pain level is a 3-4   Patient feel her legs numb

## 2016-10-20 NOTE — Progress Notes (Signed)
Subjective:  Patient ID: Michelle Gibson, female    DOB: 09-06-1976  Age: 41 y.o. MRN: [id number]  CC: Diabetes   HPI Michelle Gibson presents for complaints of chest tightness and cough. She reports one episode of coughing up red blood tinged sputum two days ago. She states " It looked like a piece of steak". She denies any constitutional symptoms. She denies any night sweats. Reports working as a [employer] and having to wear a mask, due to chemical use. She reports chemicals exacerbate her asthma despite wearing a mask. Reports having inhalers and neb treatments. She also has c/o anger issues. She states " I have a really bad temper". Denies any HI/SI. Reports poor sleep 2 hours per night and poor appetite eating one meal a day. She also c/o parathesias to the bilateral lower legs. Reports PMH of leg injury in 2010. Denies pain, swelling, or temperature changes to the extremities. She is requesting medication for leg symptoms.  Outpatient Medications Prior to Visit  Medication Sig Dispense Refill  . albuterol (PROVENTIL HFA;VENTOLIN HFA) 108 (90 Base) MCG/ACT inhaler Inhale 2 puffs into the lungs every 4 (four) hours as needed for wheezing or shortness of breath. 54 g 3  . albuterol (PROVENTIL) (2.5 MG/3ML) 0.083% nebulizer solution Take 3 mLs (2.5 mg total) by nebulization every 6 (six) hours as needed for wheezing or shortness of breath. 75 mL 12  . blood glucose meter kit and supplies KIT Dispense based on patient and insurance preference. Use up to four times daily as directed. (FOR ICD-9 250.00, 250.01). 1 each 0  . clonazePAM (KLONOPIN) 0.5 MG tablet 1/2-1 bid prn anxiety 20 tablet 0  . fluticasone (FLONASE) 50 MCG/ACT nasal spray Place 2 sprays into both nostrils daily. 16 g 0  . metFORMIN (GLUCOPHAGE) 500 MG tablet Take 1 tablet (500 mg total) by mouth 2 (two) times daily with a meal. 180 tablet 1  . mometasone-formoterol (DULERA) 200-5 MCG/ACT AERO Inhale 2 puffs into the lungs 2 (two)  times daily. 39 Inhaler 3  . predniSONE (DELTASONE) 10 MG tablet Take 4 tabs for 3 days, then 3 tabs for 3 days, then 2 tabs for 3 days, then 1 tab for 3 days, then 1/2 tab for 4 days. 32 tablet 0  . budesonide (PULMICORT) 0.5 MG/2ML nebulizer solution Take 2 mLs (0.5 mg total) by nebulization 2 (two) times daily. 120 mL 0  . ipratropium-albuterol (DUONEB) 0.5-2.5 (3) MG/3ML SOLN Take 3 mLs by nebulization every 6 (six) hours as needed. 360 mL 0  . pantoprazole (PROTONIX) 40 MG tablet Take 1 tablet (40 mg total) by mouth 2 (two) times daily before a meal. 60 tablet 0   Facility-Administered Medications Prior to Visit  Medication Dose Route Frequency Provider Last Rate Last Dose  . ipratropium-albuterol (DUONEB) 0.5-2.5 (3) MG/3ML nebulizer solution 3 mL  3 mL Nebulization Q20 Min PRN Alfonse Spruce, FNP       ROS Review of Systems  Constitutional: Positive for appetite change.  Respiratory: Negative.   Cardiovascular: Negative.   Musculoskeletal: Negative.   Neurological:       Tingling and numbness  Psychiatric/Behavioral: Positive for behavioral problems and sleep disturbance.   Objective:  BP 92/68 (BP Location: Left Arm, Patient Position: Sitting, Cuff Size: Normal)   Pulse 100   Temp 98.6 F (37 C) (Oral)   Resp 18   Ht _0  (1.702 m)   Wt 172 lb 9.6 oz (78.3 kg)   SpO2 94%  BMI 27.03 kg/m   BP/Weight 10/20/2016 10/07/2016 80/01/4714  Systolic BP 92 806 386  Diastolic BP 68 83 75  Wt. (Lbs) 172.6 171 171.2  BMI 27.03 28.46 28.49   Physical Exam  Constitutional: She is oriented to person, place, and time.  Cardiovascular: Normal rate, regular rhythm, normal heart sounds and intact distal pulses.   Pulmonary/Chest: Effort normal and breath sounds normal. No respiratory distress. She has no wheezes.  Abdominal: Soft. Bowel sounds are normal.  Musculoskeletal:       Right knee: Normal.       Left knee: Normal.  Neurological: She is alert and oriented to person,  place, and time.  Reflex Scores:      Patellar reflexes are 2+ on the right side and 2+ on the left side. Skin: Skin is warm and dry.   Assessment & Plan:   Problem List Items Addressed This Visit      Other   Hyperglycemia   Relevant Orders   Glucose (CBG) (Completed)    Other Visit Diagnoses    Feeling of chest tightness    -  Primary   Relevant Medications   predniSONE (STERAPRED UNI-PAK 21 TAB) 10 MG (21) TBPK tablet   Hemoptysis       Relevant Orders   CT ANGIO CHEST AORTA W &/OR WO CONTRAST   Numbness and tingling of both lower extremities       Relevant Medications   gabapentin (NEURONTIN) 100 MG capsule   Other Relevant Orders   Basic Metabolic Panel   CBC with Differential   Vitamin D, 25-hydroxy   Excessive anger       Relevant Orders   Ambulatory referral to Psychology     Meds ordered this encounter  Medications  . gabapentin (NEURONTIN) 100 MG capsule    Sig: Take 1 capsule (100 mg total) by mouth 3 (three) times daily.    Dispense:  30 capsule    Refill:  2    Order Specific Question:   Supervising Provider    Answer:   Tresa Garter [id number]  . predniSONE (STERAPRED UNI-PAK 21 TAB) 10 MG (21) TBPK tablet    Sig: Take 1 tablet (10 mg total) by mouth daily. Take 4 tabs for 3 days, then 3 tabs for 3 days, then 2 tabs for 3 days, then 1 tab for 3 days, then 1/2 tab for 4 days.    Dispense:  10 tablet    Refill:  0    Order Specific Question:   Supervising Provider    Answer:   Tresa Garter [id number]    Follow-up: As needed.  Alfonse Spruce FNP

## 2016-10-21 MED FILL — GABAPENTIN 100 MG CAPSULE: 100 | 10 days supply | Qty: 30 | Fill #0

## 2016-10-22 ENCOUNTER — Telehealth: Payer: Self-pay

## 2016-10-22 NOTE — Telephone Encounter (Signed)
Patient return call and was aware and understood about her appt being scheduled on 10/25/16 @ [facility] at 3:45-4pm

## 2016-10-22 NOTE — Telephone Encounter (Signed)
CMA call to inform CT Angio test appt information No anwer left a VM with the appt info and stating just to call back if have any questions

## 2016-10-25 ENCOUNTER — Ambulatory Visit (HOSPITAL_COMMUNITY): Payer: Self-pay | Attending: Family Medicine

## 2016-11-01 ENCOUNTER — Encounter (HOSPITAL_COMMUNITY): Payer: Self-pay

## 2016-11-02 ENCOUNTER — Ambulatory Visit (INDEPENDENT_AMBULATORY_CARE_PROVIDER_SITE_OTHER): Payer: Self-pay | Admitting: Licensed Clinical Social Worker

## 2016-11-02 DIAGNOSIS — F39 Unspecified mood [affective] disorder: Secondary | ICD-10-CM

## 2016-11-08 ENCOUNTER — Other Ambulatory Visit: Payer: Self-pay | Admitting: Physician Assistant

## 2016-11-08 DIAGNOSIS — F411 Generalized anxiety disorder: Secondary | ICD-10-CM

## 2016-11-08 NOTE — Telephone Encounter (Signed)
Patient requesting a refill. Last refill provided on 09/24/16.

## 2016-11-09 NOTE — Progress Notes (Signed)
Patient ID: Michelle Gibson, female   DOB: 06-Feb-1976, 41 y.o.   MRN: [id number] Patient arrived at the office to obtain information for the [facility].  Patient did not want to complete an assessment stating "I don't have insurance."  Patient ackowledge that she needs MH assistance.  Patient to obtain treatment at a later date per Patient.  Lupita Raider. [place] LCSW

## 2016-11-12 ENCOUNTER — Other Ambulatory Visit: Payer: Self-pay | Admitting: Family Medicine

## 2016-11-12 NOTE — Telephone Encounter (Signed)
[facility] pharmacy

## 2016-11-12 NOTE — Telephone Encounter (Signed)
Patient states she would like to try escitalopram.

## 2016-11-13 ENCOUNTER — Other Ambulatory Visit: Payer: Self-pay | Admitting: Family Medicine

## 2016-11-13 DIAGNOSIS — F411 Generalized anxiety disorder: Secondary | ICD-10-CM

## 2016-11-13 MED ORDER — ESCITALOPRAM OXALATE 10 MG PO TABS: 10.0000 mg | ORAL_TABLET | Freq: Every day | ORAL | 0 refills | Status: DC

## 2016-11-16 ENCOUNTER — Ambulatory Visit: Payer: Self-pay | Admitting: Licensed Clinical Social Worker

## 2016-11-17 ENCOUNTER — Telehealth: Payer: Self-pay | Admitting: Family Medicine

## 2016-11-17 NOTE — Telephone Encounter (Signed)
Pt calling stating her job needs a written medical release allowing pt to go back to work after being absent due to a severe asthma attack

## 2016-11-17 NOTE — Telephone Encounter (Signed)
Called and spoke with patient to address her concern. Work release form will be available for pick up.

## 2016-12-20 ENCOUNTER — Emergency Department (HOSPITAL_COMMUNITY)
Admission: EM | Admit: 2016-12-20 | Discharge: 2016-12-21 | Disposition: A | Discharge status: Home / Self Care | Payer: Self-pay | Attending: Emergency Medicine | Admitting: Emergency Medicine

## 2016-12-20 ENCOUNTER — Encounter (HOSPITAL_COMMUNITY): Payer: Self-pay | Admitting: *Deleted

## 2016-12-20 DIAGNOSIS — Z7984 Long term (current) use of oral hypoglycemic drugs: Secondary | ICD-10-CM | POA: Insufficient documentation

## 2016-12-20 DIAGNOSIS — Z79899 Other long term (current) drug therapy: Secondary | ICD-10-CM | POA: Insufficient documentation

## 2016-12-20 DIAGNOSIS — J45909 Unspecified asthma, uncomplicated: Secondary | ICD-10-CM | POA: Insufficient documentation

## 2016-12-20 DIAGNOSIS — R1012 Left upper quadrant pain: Secondary | ICD-10-CM

## 2016-12-20 DIAGNOSIS — N3000 Acute cystitis without hematuria: Secondary | ICD-10-CM | POA: Insufficient documentation

## 2016-12-20 MED ORDER — FENTANYL CITRATE (PF) 100 MCG/2ML IJ SOLN
50.0000 ug | Freq: Once | INTRAMUSCULAR | Status: AC
Start: 2016-12-20 — End: 2016-12-21
  Administered 2016-12-21: 50 ug via INTRAVENOUS
  Filled 2016-12-20: qty 2

## 2016-12-20 MED ORDER — ONDANSETRON HCL 4 MG/2ML IJ SOLN
4.0000 mg | Freq: Once | INTRAMUSCULAR | Status: AC
  Administered 2016-12-21: 4 mg via INTRAVENOUS
  Filled 2016-12-20: qty 2

## 2016-12-20 NOTE — ED Triage Notes (Signed)
The pt is c/o pain in her lt lower ribs laterally for one week  No known injury   lmp none hysterectomy

## 2016-12-20 NOTE — ED Provider Notes (Signed)
[place] DEPT Provider Note   CSN: [id number] Arrival date & time: 12/20/16  2247  By signing my name below, I, Gwenlyn Fudge, attest that this documentation has been prepared under the direction and in the presence of Ripley Fraise, MD. Electronically Signed: Gwenlyn Fudge, ED Scribe. 12/20/16. 11:47 PM.   History   Chief Complaint Chief Complaint  Patient presents with  . Abdominal Pain    The history is provided by the patient. No language interpreter was used.    HPI Comments: Michelle Gibson is a 41 y.o. female with PMHx of Wolff-Parkinson-White syndrome (s/p ablation), Asthma, DM and Pancreatitis who presents to the Emergency Department complaining of acute onset, progressively worsening, constant, moderate LUQ abdominal pain for 1 week. Abdominal pain is similar to prior pancreatitis and is associated with eating. She states pain began the day after sampling moonshine. Denies known injury or trauma to the area. Pt reports associated shooting chest pain, shortness of breath, and productive cough. Abdominal PSHx includes cholecystectomy and hysterectomy. She has undergone ablation for WPW syndrome. No PMHx of MI or PE/DVT. She takes Metformin and Albuterol as needed. No estrogen replacement. She denies pain in the lower abdomen, LOC, and fever.  Past Medical History:  Diagnosis Date  . Asthma   . WPW (Wolff-Parkinson-White syndrome)     Patient Active Problem List   Diagnosis Date Noted  . Asthma with acute exacerbation 09/15/2016  . Hyperglycemia 09/15/2016  . History of Wolff-Parkinson-White (WPW) syndrome 09/15/2016  . Acute respiratory failure with hypoxia ([place]) 09/15/2016  . Bronchospasm 09/11/2016    Past Surgical History:  Procedure Laterality Date  . ABDOMINAL HYSTERECTOMY    . CARDIAC SURGERY    . CESAREAN SECTION      OB History    No data available       Home Medications    Prior to Admission medications   Medication Sig Start Date End Date Taking?  Authorizing Provider  albuterol (PROVENTIL HFA;VENTOLIN HFA) 108 (90 Base) MCG/ACT inhaler Inhale 2 puffs into the lungs every 4 (four) hours as needed for wheezing or shortness of breath. 10/18/16   Tresa Garter, MD  albuterol (PROVENTIL) (2.5 MG/3ML) 0.083% nebulizer solution Take 3 mLs (2.5 mg total) by nebulization every 6 (six) hours as needed for wheezing or shortness of breath. 09/24/16   Argentina Donovan, PA-C  blood glucose meter kit and supplies KIT Dispense based on patient and insurance preference. Use up to four times daily as directed. (FOR ICD-9 250.00, 250.01). 09/19/16   Rich Fuchs Choi, DO  budesonide (PULMICORT) 0.5 MG/2ML nebulizer solution Take 2 mLs (0.5 mg total) by nebulization 2 (two) times daily. 09/19/16 10/19/16  Jennifer Chahn-Yang Choi, DO  clonazePAM (KLONOPIN) 0.5 MG tablet 1/2-1 bid prn anxiety 09/24/16   Argentina Donovan, PA-C  escitalopram (LEXAPRO) 10 MG tablet Take 1 tablet (10 mg total) by mouth daily. After 1 week may increase to 2 tablets (20 mg total) by mouth daily. 11/13/16   Alfonse Spruce, FNP  fluticasone (FLONASE) 50 MCG/ACT nasal spray Place 2 sprays into both nostrils daily. 09/19/16   Jennifer Chahn-Yang Choi, DO  gabapentin (NEURONTIN) 100 MG capsule Take 1 capsule (100 mg total) by mouth 3 (three) times daily. 10/20/16   Alfonse Spruce, FNP  ipratropium-albuterol (DUONEB) 0.5-2.5 (3) MG/3ML SOLN Take 3 mLs by nebulization every 6 (six) hours as needed. 10/07/16 10/13/16  Alfonse Spruce, FNP  metFORMIN (GLUCOPHAGE) 500 MG tablet Take 1 tablet (500 mg total) by  mouth 2 (two) times daily with a meal. 09/24/16   Argentina Donovan, PA-C  mometasone-formoterol (DULERA) 200-5 MCG/ACT AERO Inhale 2 puffs into the lungs 2 (two) times daily. 10/18/16   Tresa Garter, MD  pantoprazole (PROTONIX) 40 MG tablet Take 1 tablet (40 mg total) by mouth 2 (two) times daily before a meal. 09/19/16 10/19/16  Jennifer Chahn-Yang Choi, DO  predniSONE  (DELTASONE) 10 MG tablet Take 4 tabs for 3 days, then 3 tabs for 3 days, then 2 tabs for 3 days, then 1 tab for 3 days, then 1/2 tab for 4 days. 09/19/16   Jennifer Chahn-Yang Choi, DO  predniSONE (STERAPRED UNI-PAK 21 TAB) 10 MG (21) TBPK tablet Take 1 tablet (10 mg total) by mouth daily. Take 4 tabs for 3 days, then 3 tabs for 3 days, then 2 tabs for 3 days, then 1 tab for 3 days, then 1/2 tab for 4 days. 10/20/16   Alfonse Spruce, FNP    Family History No family history on file.  Social History Social History  Substance Use Topics  . Smoking status: Never Smoker  . Smokeless tobacco: Never Used  . Alcohol use No     Allergies   Ciprofloxacin   Review of Systems Review of Systems  Constitutional: Negative for fever.  Respiratory: Positive for cough and shortness of breath.   Cardiovascular: Positive for chest pain.  Gastrointestinal: Positive for abdominal pain.  Neurological: Negative for syncope.  All other systems reviewed and are negative.  Physical Exam Updated Vital Signs BP 138/84 (BP Location: Left Arm)   Pulse 102   Temp 98.8 F (37.1 C) (Oral)   Resp 16   Ht _0  (1.676 m)   Wt 173 lb 2 oz (78.5 kg)   SpO2 100%   BMI 27.94 kg/m   Physical Exam CONSTITUTIONAL: Well developed/well nourished HEAD: Normocephalic/atraumatic EYES: EOMI/PERRL, no icterus  ENMT: Mucous membranes moist NECK: supple no meningeal signs SPINE/BACK:entire spine nontender CV: S1/S2 noted, no murmurs/rubs/gallops noted LUNGS: Lungs are clear to auscultation bilaterally, no apparent distress ABDOMEN: soft, moderate LUQ tenderness, no rebound or guarding, bowel sounds noted throughout abdomen GU: Left CVA tenderness NEURO: Pt is awake/alert/appropriate, moves all extremitiesx4.  No facial droop.   EXTREMITIES: pulses normal/equal, full ROM SKIN: warm, color normal PSYCH: no abnormalities of mood noted, alert and oriented to situation   ED Treatments / Results  DIAGNOSTIC  STUDIES: Oxygen Saturation is 100% on RA, normal by my interpretation.    COORDINATION OF CARE: 11:29 PM Discussed treatment plan with pt at bedside which includes Fentanyl, Zofran, CMP, CBC with differential, Lipase, Urinalysis and EKG and pt agreed to plan.  Labs (all labs ordered are listed, but only abnormal results are displayed) Labs Reviewed  URINALYSIS, ROUTINE W REFLEX MICROSCOPIC - Abnormal; Notable for the following:       Result Value   APPearance HAZY (*)    Nitrite POSITIVE (*)    Bacteria, UA MANY (*)    Squamous Epithelial / LPF 0-5 (*)    All other components within normal limits  COMPREHENSIVE METABOLIC PANEL  CBC WITH DIFFERENTIAL/PLATELET  LIPASE, BLOOD    EKG  EKG Interpretation  Date/Time:  Monday December 20 2016 23:57:48 EDT Ventricular Rate:  81 PR Interval:    QRS Duration: 100 QT Interval:  375 QTC Calculation: 436 R Axis:   22 Text Interpretation:  Sinus rhythm Borderline T wave abnormalities Baseline wander in lead(s) III V1 V4 V5 No significant change  since last tracing Confirmed by Christy Gentles  MD, [place] ([id number]) on 12/21/2016 12:01:18 AM       Radiology No results found.  Procedures Procedures    Medications Ordered in ED Medications  fentaNYL (SUBLIMAZE) injection 50 mcg (50 mcg Intravenous Given 12/21/16 0043)  ondansetron (ZOFRAN) injection 4 mg (4 mg Intravenous Given 12/21/16 0043)  fentaNYL (SUBLIMAZE) injection 50 mcg (50 mcg Intravenous Given 12/21/16 0219)  cephALEXin (KEFLEX) capsule 500 mg (500 mg Oral Given 12/21/16 0219)     Initial Impression / Assessment and Plan / ED Course  I have reviewed the triage vital signs and the nursing notes.  Pertinent labs  results that were available during my care of the patient were reviewed by me and considered in my medical decision making (see chart for details).    3:14 AM Pt monitored for several hrs Pt stable Vitals appropriate She has had some relief of pain abd soft without  guarding or rebound No CP reported She reports this is all similar to prior episodes of pancreatitis Lipase normal She does have evidence of UTI - keflex ordered Otherwise she is well appearing and I don't feel emergent imaging is requred  I personally performed the services described in this documentation, which was scribed in my presence. The recorded information has been reviewed and is accurate.        Final Clinical Impressions(s) / ED Diagnoses   Final diagnoses:  Left upper quadrant pain  Acute cystitis without hematuria    New Prescriptions New Prescriptions   CEPHALEXIN (KEFLEX) 500 MG CAPSULE    Take 1 capsule (500 mg total) by mouth 2 (two) times daily.   IBUPROFEN (ADVIL,MOTRIN) 600 MG TABLET    Take 1 tablet (600 mg total) by mouth every 8 (eight) hours as needed.     Ripley Fraise, MD 12/21/16 [phone]

## 2016-12-21 LAB — CBC WITH DIFFERENTIAL/PLATELET
BASOS ABS: 0 10*3/uL (ref 0.0–0.1)
BASOS PCT: 0 %
EOS ABS: 0.4 10*3/uL (ref 0.0–0.7)
Eosinophils Relative: 4 %
HEMATOCRIT: 39.5 % (ref 36.0–46.0)
HEMOGLOBIN: 13.2 g/dL (ref 12.0–15.0)
Lymphocytes Relative: 37 %
Lymphs Abs: 3.3 10*3/uL (ref 0.7–4.0)
MCH: 30.8 pg (ref 26.0–34.0)
MCHC: 33.4 g/dL (ref 30.0–36.0)
MCV: 92.3 fL (ref 78.0–100.0)
MONOS PCT: 4 %
Monocytes Absolute: 0.4 10*3/uL (ref 0.1–1.0)
NEUTROS ABS: 4.9 10*3/uL (ref 1.7–7.7)
NEUTROS PCT: 55 %
Platelets: 335 10*3/uL (ref 150–400)
RBC: 4.28 MIL/uL (ref 3.87–5.11)
RDW: 12.9 % (ref 11.5–15.5)
WBC: 9 10*3/uL (ref 4.0–10.5)

## 2016-12-21 LAB — COMPREHENSIVE METABOLIC PANEL
ALK PHOS: 63 U/L (ref 38–126)
ALT: 14 U/L (ref 14–54)
ANION GAP: 7 (ref 5–15)
AST: 17 U/L (ref 15–41)
Albumin: 4.1 g/dL (ref 3.5–5.0)
BILIRUBIN TOTAL: 0.6 mg/dL (ref 0.3–1.2)
BUN: 13 mg/dL (ref 6–20)
CO2: 28 mmol/L (ref 22–32)
CREATININE: 0.94 mg/dL (ref 0.44–1.00)
Calcium: 9.3 mg/dL (ref 8.9–10.3)
Chloride: 105 mmol/L (ref 101–111)
Glucose, Bld: 90 mg/dL (ref 65–99)
Potassium: 3.7 mmol/L (ref 3.5–5.1)
SODIUM: 140 mmol/L (ref 135–145)
TOTAL PROTEIN: 6.8 g/dL (ref 6.5–8.1)

## 2016-12-21 LAB — URINALYSIS, ROUTINE W REFLEX MICROSCOPIC
BILIRUBIN URINE: NEGATIVE
Glucose, UA: NEGATIVE mg/dL
HGB URINE DIPSTICK: NEGATIVE
Ketones, ur: NEGATIVE mg/dL
Leukocytes, UA: NEGATIVE
NITRITE: POSITIVE — AB
PROTEIN: NEGATIVE mg/dL
Specific Gravity, Urine: 1.02 (ref 1.005–1.030)
pH: 6 (ref 5.0–8.0)

## 2016-12-21 LAB — LIPASE, BLOOD: LIPASE: 29 U/L (ref 11–51)

## 2016-12-21 MED ORDER — FENTANYL CITRATE (PF) 100 MCG/2ML IJ SOLN
50.0000 ug | Freq: Once | INTRAMUSCULAR | Status: AC
  Administered 2016-12-21: 50 ug via INTRAVENOUS
  Filled 2016-12-21: qty 2

## 2016-12-21 MED ORDER — CEPHALEXIN 500 MG PO CAPS: 500.0000 mg | ORAL_CAPSULE | Freq: Two times a day (BID) | ORAL | 0 refills | Status: DC

## 2016-12-21 MED ORDER — CEPHALEXIN 250 MG PO CAPS
500.0000 mg | ORAL_CAPSULE | Freq: Once | ORAL | Status: AC
  Administered 2016-12-21: 500 mg via ORAL
  Filled 2016-12-21: qty 2

## 2016-12-21 MED ORDER — IBUPROFEN 600 MG PO TABS: 600.0000 mg | ORAL_TABLET | Freq: Three times a day (TID) | ORAL | 0 refills | Status: DC | PRN

## 2016-12-21 NOTE — Discharge Instructions (Signed)

## 2016-12-21 NOTE — ED Notes (Signed)
Dr. Wickline at bedside.  

## 2016-12-28 ENCOUNTER — Emergency Department (HOSPITAL_COMMUNITY): Payer: Self-pay

## 2016-12-28 ENCOUNTER — Emergency Department (HOSPITAL_COMMUNITY)
Admission: EM | Admit: 2016-12-28 | Discharge: 2016-12-28 | Disposition: A | Discharge status: Home / Self Care | Payer: Self-pay | Attending: Emergency Medicine | Admitting: Emergency Medicine

## 2016-12-28 ENCOUNTER — Encounter (HOSPITAL_COMMUNITY): Payer: Self-pay

## 2016-12-28 DIAGNOSIS — R109 Unspecified abdominal pain: Secondary | ICD-10-CM

## 2016-12-28 DIAGNOSIS — N289 Disorder of kidney and ureter, unspecified: Secondary | ICD-10-CM | POA: Insufficient documentation

## 2016-12-28 DIAGNOSIS — Z7984 Long term (current) use of oral hypoglycemic drugs: Secondary | ICD-10-CM | POA: Insufficient documentation

## 2016-12-28 DIAGNOSIS — R1012 Left upper quadrant pain: Secondary | ICD-10-CM

## 2016-12-28 DIAGNOSIS — J45909 Unspecified asthma, uncomplicated: Secondary | ICD-10-CM | POA: Insufficient documentation

## 2016-12-28 LAB — COMPREHENSIVE METABOLIC PANEL
ALBUMIN: 4.1 g/dL (ref 3.5–5.0)
ALK PHOS: 69 U/L (ref 38–126)
ALT: 23 U/L (ref 14–54)
AST: 27 U/L (ref 15–41)
Anion gap: 12 (ref 5–15)
BILIRUBIN TOTAL: 0.9 mg/dL (ref 0.3–1.2)
BUN: 23 mg/dL — AB (ref 6–20)
CO2: 22 mmol/L (ref 22–32)
CREATININE: 1.06 mg/dL — AB (ref 0.44–1.00)
Calcium: 9.3 mg/dL (ref 8.9–10.3)
Chloride: 105 mmol/L (ref 101–111)
GFR calc Af Amer: >60 mL/min (ref >60)
GFR calc non Af Amer: >60 mL/min (ref >60)
GLUCOSE: 120 mg/dL — AB (ref 65–99)
POTASSIUM: 4.1 mmol/L (ref 3.5–5.1)
Sodium: 139 mmol/L (ref 135–145)
TOTAL PROTEIN: 7.4 g/dL (ref 6.5–8.1)

## 2016-12-28 LAB — CBC
HEMATOCRIT: 42.1 % (ref 36.0–46.0)
Hemoglobin: 14 g/dL (ref 12.0–15.0)
MCH: 30.5 pg (ref 26.0–34.0)
MCHC: 33.3 g/dL (ref 30.0–36.0)
MCV: 91.7 fL (ref 78.0–100.0)
Platelets: 344 10*3/uL (ref 150–400)
RBC: 4.59 MIL/uL (ref 3.87–5.11)
RDW: 12.5 % (ref 11.5–15.5)
WBC: 6.8 10*3/uL (ref 4.0–10.5)

## 2016-12-28 LAB — URINALYSIS, ROUTINE W REFLEX MICROSCOPIC
BILIRUBIN URINE: NEGATIVE
GLUCOSE, UA: NEGATIVE mg/dL
Hgb urine dipstick: NEGATIVE
KETONES UR: NEGATIVE mg/dL
Leukocytes, UA: NEGATIVE
NITRITE: NEGATIVE
PH: 5 (ref 5.0–8.0)
Protein, ur: NEGATIVE mg/dL
Specific Gravity, Urine: 1.025 (ref 1.005–1.030)

## 2016-12-28 LAB — LIPASE, BLOOD: Lipase: 29 U/L (ref 11–51)

## 2016-12-28 LAB — PREGNANCY, URINE: Preg Test, Ur: NEGATIVE

## 2016-12-28 MED ORDER — SODIUM CHLORIDE 0.9 % IV BOLUS (SEPSIS)
1000.0000 mL | Freq: Once | INTRAVENOUS | Status: AC
  Administered 2016-12-28: 1000 mL via INTRAVENOUS

## 2016-12-28 MED ORDER — ONDANSETRON 4 MG PO TBDP: 4.0000 mg | ORAL_TABLET | Freq: Once | ORAL | Status: DC

## 2016-12-28 MED ORDER — ONDANSETRON HCL 4 MG/2ML IJ SOLN
4.0000 mg | Freq: Once | INTRAMUSCULAR | Status: AC
  Administered 2016-12-28: 4 mg via INTRAVENOUS
  Filled 2016-12-28: qty 2

## 2016-12-28 MED ORDER — FENTANYL CITRATE (PF) 100 MCG/2ML IJ SOLN
50.0000 ug | Freq: Once | INTRAMUSCULAR | Status: AC
  Administered 2016-12-28: 50 ug via INTRAVENOUS
  Filled 2016-12-28: qty 2

## 2016-12-28 MED ORDER — IOPAMIDOL (ISOVUE-300) INJECTION 61%
INTRAVENOUS | Status: AC
  Administered 2016-12-28: 100 mL
  Filled 2016-12-28: qty 100

## 2016-12-28 MED ORDER — MORPHINE SULFATE (PF) 4 MG/ML IV SOLN
4.0000 mg | Freq: Once | INTRAVENOUS | Status: DC
Start: 2016-12-28 — End: 2016-12-28

## 2016-12-28 MED ORDER — FENTANYL CITRATE (PF) 100 MCG/2ML IJ SOLN
50.0000 ug | Freq: Once | INTRAMUSCULAR | Status: DC
Start: 2016-12-28 — End: 2016-12-28

## 2016-12-28 MED ORDER — ONDANSETRON HCL 4 MG/2ML IJ SOLN: 4.0000 mg | Freq: Once | INTRAMUSCULAR | Status: DC

## 2016-12-28 MED ORDER — KETOROLAC TROMETHAMINE 30 MG/ML IJ SOLN: 30.0000 mg | Freq: Once | INTRAMUSCULAR | Status: DC

## 2016-12-28 NOTE — ED Notes (Signed)
Patient transported to CT 

## 2016-12-28 NOTE — ED Provider Notes (Signed)
[place] DEPT Provider Note   CSN: [id number] Arrival date & time: 12/28/16  1203  History   Chief Complaint LUQ pain  HPI   Michelle Gibson is an 41 y.o. female with history of WPW (now s/p ablation) and interstitial cystitis who presents to the ED for evaluation of LUQ pain. Pain radiates to her left flank. Pain started two weeks ago and has progressively gotten worse. She was seen in the ED initially on 12/20/16 for this complaint with negative workup. She was sent home on a course of cephalexin for UTI which she states she has completed. She states her pain is getting worse. It is worse with laying flat on her back. She notes associated loose, watery diarrhea several times a day over the past two weeks. She notes a couple episodes of BRBPR last week, which she attributes to irritation from diarrhea. Denies black tarry stools. Denies hematemesis. She has been taking a lot of ibuprofen for her pain. Denies nausea or vomiting. Denies fever or chills. Denies chest pain or SOB. Denies drug or alcohol use. She states she was told a long time ago she has pancreatitis. She is s/p appendectomy, cholecystectomy, and hysterectomy.  Past Medical History:  Diagnosis Date  . Asthma   . WPW (Wolff-Parkinson-White syndrome)     Patient Active Problem List   Diagnosis Date Noted  . Asthma with acute exacerbation 09/15/2016  . Hyperglycemia 09/15/2016  . History of Wolff-Parkinson-White (WPW) syndrome 09/15/2016  . Acute respiratory failure with hypoxia ([place]) 09/15/2016  . Bronchospasm 09/11/2016    Past Surgical History:  Procedure Laterality Date  . ABDOMINAL HYSTERECTOMY    . CARDIAC SURGERY    . CESAREAN SECTION      OB History    No data available       Home Medications    Prior to Admission medications   Medication Sig Start Date End Date Taking? Authorizing Provider  acetaminophen (TYLENOL) 325 MG tablet Take 325 mg by mouth every 6 (six) hours as needed for moderate pain.    Yes Historical Provider, MD  albuterol (PROVENTIL HFA;VENTOLIN HFA) 108 (90 Base) MCG/ACT inhaler Inhale 2 puffs into the lungs every 4 (four) hours as needed for wheezing or shortness of breath. 10/18/16  Yes Tresa Garter, MD  albuterol (PROVENTIL) (2.5 MG/3ML) 0.083% nebulizer solution Take 3 mLs (2.5 mg total) by nebulization every 6 (six) hours as needed for wheezing or shortness of breath. 09/24/16  Yes Dionne Bucy McClung, PA-C  clonazePAM (KLONOPIN) 0.5 MG tablet Take 0.5 mg by mouth at bedtime as needed for anxiety or sleep. 09/24/16  Yes Historical Provider, MD  fluticasone (FLONASE) 50 MCG/ACT nasal spray Place 2 sprays into both nostrils daily. Patient taking differently: Place 2 sprays into both nostrils daily as needed for allergies.  09/19/16  Yes Jennifer Chahn-Yang Choi, DO  gabapentin (NEURONTIN) 100 MG capsule Take 1 capsule (100 mg total) by mouth 3 (three) times daily. 10/20/16  Yes Alfonse Spruce, FNP  HYDROcodone-acetaminophen (NORCO/VICODIN) 5-325 MG tablet Take 1 tablet by mouth every 6 (six) hours as needed for moderate pain.   Yes Historical Provider, MD  ibuprofen (ADVIL,MOTRIN) 600 MG tablet Take 1 tablet (600 mg total) by mouth every 8 (eight) hours as needed. Patient taking differently: Take 600 mg by mouth every 8 (eight) hours as needed for moderate pain.  12/21/16  Yes Ripley Fraise, MD  metFORMIN (GLUCOPHAGE) 500 MG tablet Take 1 tablet (500 mg total) by mouth 2 (two) times daily  with a meal. 09/24/16  Yes Dionne Bucy McClung, PA-C  mometasone-formoterol (DULERA) 200-5 MCG/ACT AERO Inhale 2 puffs into the lungs 2 (two) times daily. Patient taking differently: Inhale 2 puffs into the lungs 2 (two) times daily as needed for shortness of breath.  10/18/16  Yes Tresa Garter, MD  omeprazole (PRILOSEC OTC) 20 MG tablet Take 20 mg by mouth daily as needed (heartburn).   Yes Historical Provider, MD  cephALEXin (KEFLEX) 500 MG capsule Take 1 capsule (500 mg total) by  mouth 2 (two) times daily. 12/21/16   Ripley Fraise, MD  escitalopram (LEXAPRO) 10 MG tablet Take 1 tablet (10 mg total) by mouth daily. After 1 week may increase to 2 tablets (20 mg total) by mouth daily. Patient not taking: Reported on 12/21/2016 11/13/16   Alfonse Spruce, FNP    Family History No family history on file.  Social History Social History  Substance Use Topics  . Smoking status: Never Smoker  . Smokeless tobacco: Never Used  . Alcohol use No     Allergies   Ciprofloxacin   Review of Systems Review of Systems 10 Systems reviewed and are negative for acute change except as noted in the HPI.  Physical Exam Updated Vital Signs BP (!) 112/92 (BP Location: Right Arm)   Pulse 85   Temp 97.6 F (36.4 C) (Oral)   Resp 18   SpO2 100%   Physical Exam  Constitutional: She is oriented to person, place, and time.  HENT:  Right Ear: External ear normal.  Left Ear: External ear normal.  Nose: Nose normal.  Mouth/Throat: Oropharynx is clear and moist. No oropharyngeal exudate.  Eyes: Conjunctivae are normal.  Neck: Neck supple.  Cardiovascular: Normal rate, regular rhythm, normal heart sounds and intact distal pulses.   Pulmonary/Chest: Effort normal and breath sounds normal. No respiratory distress. She has no wheezes.  Abdominal: Soft. Bowel sounds are normal. She exhibits no distension. There is tenderness (LUQ and L CVA). There is no rebound and no guarding.  Musculoskeletal: She exhibits no edema.  Lymphadenopathy:    She has no cervical adenopathy.  Neurological: She is alert and oriented to person, place, and time. No cranial nerve deficit.  Skin: Skin is warm and dry.  Psychiatric: She has a normal mood and affect.  Nursing note and vitals reviewed.    ED Treatments / Results  Labs Results for orders placed or performed during the hospital encounter of 12/28/16  Lipase, blood  Result Value Ref Range   Lipase 29 11 - 51 U/L  Comprehensive  metabolic panel  Result Value Ref Range   Sodium 139 135 - 145 mmol/L   Potassium 4.1 3.5 - 5.1 mmol/L   Chloride 105 101 - 111 mmol/L   CO2 22 22 - 32 mmol/L   Glucose, Bld 120 (H) 65 - 99 mg/dL   BUN 23 (H) 6 - 20 mg/dL   Creatinine, Ser 1.06 (H) 0.44 - 1.00 mg/dL   Calcium 9.3 8.9 - 10.3 mg/dL   Total Protein 7.4 6.5 - 8.1 g/dL   Albumin 4.1 3.5 - 5.0 g/dL   AST 27 15 - 41 U/L   ALT 23 14 - 54 U/L   Alkaline Phosphatase 69 38 - 126 U/L   Total Bilirubin 0.9 0.3 - 1.2 mg/dL   GFR calc non Af Amer >60 >60 mL/min   GFR calc Af Amer >60 >60 mL/min   Anion gap 12 5 - 15  CBC  Result Value  Ref Range   WBC 6.8 4.0 - 10.5 K/uL   RBC 4.59 3.87 - 5.11 MIL/uL   Hemoglobin 14.0 12.0 - 15.0 g/dL   HCT 42.1 36.0 - 46.0 %   MCV 91.7 78.0 - 100.0 fL   MCH 30.5 26.0 - 34.0 pg   MCHC 33.3 30.0 - 36.0 g/dL   RDW 12.5 11.5 - 15.5 %   Platelets 344 150 - 400 K/uL  Urinalysis, Routine w reflex microscopic  Result Value Ref Range   Color, Urine YELLOW YELLOW   APPearance CLEAR CLEAR   Specific Gravity, Urine 1.025 1.005 - 1.030   pH 5.0 5.0 - 8.0   Glucose, UA NEGATIVE NEGATIVE mg/dL   Hgb urine dipstick NEGATIVE NEGATIVE   Bilirubin Urine NEGATIVE NEGATIVE   Ketones, ur NEGATIVE NEGATIVE mg/dL   Protein, ur NEGATIVE NEGATIVE mg/dL   Nitrite NEGATIVE NEGATIVE   Leukocytes, UA NEGATIVE NEGATIVE  Pregnancy, urine  Result Value Ref Range   Preg Test, Ur NEGATIVE NEGATIVE    EKG  EKG Interpretation None       Radiology Ct Abdomen Pelvis W Contrast  Result Date: 12/28/2016 CLINICAL DATA:  Left upper quadrant abdominal pain and diarrhea for a few months. EXAM: CT ABDOMEN AND PELVIS WITH CONTRAST TECHNIQUE: Multidetector CT imaging of the abdomen and pelvis was performed using the standard protocol following bolus administration of intravenous contrast. CONTRAST:  166mL ISOVUE-300 IOPAMIDOL (ISOVUE-300) INJECTION 61% COMPARISON:  None. FINDINGS: Lower chest: The lung bases are clear of  acute process. No pleural effusion or pulmonary lesions. The heart is normal in size. No pericardial effusion. The distal esophagus and aorta are unremarkable. Hepatobiliary: No focal hepatic lesions or intrahepatic biliary dilatation. The gallbladder is surgically absent. No common bile duct dilatation. Pancreas: Normal Spleen: Normal Adrenals/Urinary Tract: The adrenal glands and kidneys are unremarkable. There is a small cyst projecting off the lower pole region of the left kidney. No renal or obstructing ureteral calculi. Stomach/Bowel: The stomach, duodenum, small bowel and colon are unremarkable without oral contrast. No inflammatory changes, mass lesions or obstructive findings. The terminal ileum is normal. The appendix is surgically absent. Vascular/Lymphatic: The aorta is normal in caliber. No dissection. The branch vessels are patent. The major venous structures are patent. No mesenteric or retroperitoneal mass or adenopathy. Small scattered lymph nodes are noted. Reproductive: The uterus and ovaries are surgically absent. Other: No pelvic mass or adenopathy. No free pelvic fluid collections. No inguinal mass or adenopathy. No abdominal wall hernia or subcutaneous lesions. Musculoskeletal: No significant bony findings. A sacral nerve stimulator is noted. IMPRESSION: 1. No acute abdominal/pelvic findings, mass lesions or lymphadenopathy. 2. Status post cholecystectomy.  No biliary dilatation. 3. Status post hysterectomy, bilateral oophorectomy and appendectomy. Electronically Signed   By: Marijo Sanes M.D.   On: 12/28/2016 19:45    Procedures Procedures (including critical care time)  Medications Ordered in ED Medications  sodium chloride 0.9 % bolus 1,000 mL (0 mLs Intravenous Stopped 12/28/16 2105)  fentaNYL (SUBLIMAZE) injection 50 mcg (50 mcg Intravenous Given 12/28/16 1800)  ondansetron (ZOFRAN) injection 4 mg (4 mg Intravenous Given 12/28/16 1759)  iopamidol (ISOVUE-300) 61 % injection (100  mLs  Contrast Given 12/28/16 1917)     Initial Impression / Assessment and Plan / ED Course  I have reviewed the triage vital signs and the nursing notes.  Pertinent labs & imaging results that were available during my care of the patient were reviewed by me and considered in my medical decision making (  see chart for details).  Pt is an 41 y.o. female with history as above, presenting for eval of two weeks of worsening LUQ and left flank pain. No n/v. +diarrhea with a couple episodes of BRBPR. Her initial labs reveal some mild renal insufficiency, though it appears her past values due tend to fluctuate with creatinines 0.9-1.2. Lipase and CBC normal. UA/urine preg pending. She is quite tender. Given duration of symptoms and tenderness on xam we'll proceed with CT abd/pelvis with contrast for further evaluation of her pain.   Workup unrevealing. Pt tolerating PO. Instructed close pcp f/u and consider GI referral. Return precautions given.  Final Clinical Impressions(s) / ED Diagnoses   Final diagnoses:  LUQ pain  Left flank pain  Renal insufficiency    New Prescriptions Discharge Medication List as of 12/28/2016  8:55 PM       Anne Ng, PA-C 12/29/16 0041    Gwenyth Allegra Tegeler, MD 12/29/16 1151

## 2016-12-28 NOTE — ED Triage Notes (Signed)
Patient complains of ongoing left side and back pain x 2 weeks. Seen in ED for same last week and no diagnosis. States she feels achy all over and ongoing side pain

## 2016-12-28 NOTE — Discharge Instructions (Signed)
Your CT scan was negative for acute findings. Your kidney function was slightly abnormal today. Please follow up with your primary care provider as soon as possible. They might want to re-check your bloodwork in the near future and consider referring you to gastroenterology for follow up. Return to the ER for new or worsening symptoms.

## 2017-01-03 ENCOUNTER — Inpatient Hospital Stay: Payer: Self-pay | Admitting: Family Medicine

## 2017-06-29 ENCOUNTER — Emergency Department (HOSPITAL_COMMUNITY): Payer: Self-pay

## 2017-06-29 ENCOUNTER — Emergency Department (HOSPITAL_COMMUNITY)
Admission: EM | Admit: 2017-06-29 | Discharge: 2017-06-29 | Disposition: A | Discharge status: Home / Self Care | Payer: Self-pay | Attending: Emergency Medicine | Admitting: Emergency Medicine

## 2017-06-29 ENCOUNTER — Encounter (HOSPITAL_COMMUNITY): Payer: Self-pay | Admitting: Emergency Medicine

## 2017-06-29 DIAGNOSIS — J45909 Unspecified asthma, uncomplicated: Secondary | ICD-10-CM | POA: Insufficient documentation

## 2017-06-29 DIAGNOSIS — Z79899 Other long term (current) drug therapy: Secondary | ICD-10-CM | POA: Insufficient documentation

## 2017-06-29 DIAGNOSIS — Y92410 Unspecified street and highway as the place of occurrence of the external cause: Secondary | ICD-10-CM | POA: Insufficient documentation

## 2017-06-29 DIAGNOSIS — M5441 Lumbago with sciatica, right side: Secondary | ICD-10-CM | POA: Insufficient documentation

## 2017-06-29 DIAGNOSIS — Z462 Encounter for fitting and adjustment of other devices related to nervous system and special senses: Secondary | ICD-10-CM | POA: Insufficient documentation

## 2017-06-29 DIAGNOSIS — Y999 Unspecified external cause status: Secondary | ICD-10-CM | POA: Insufficient documentation

## 2017-06-29 DIAGNOSIS — Y9389 Activity, other specified: Secondary | ICD-10-CM | POA: Insufficient documentation

## 2017-06-29 DIAGNOSIS — Z7984 Long term (current) use of oral hypoglycemic drugs: Secondary | ICD-10-CM | POA: Insufficient documentation

## 2017-06-29 MED ORDER — METHOCARBAMOL 500 MG PO TABS: 500.0000 mg | ORAL_TABLET | Freq: Two times a day (BID) | ORAL | 0 refills | Status: DC

## 2017-06-29 MED ORDER — LIDOCAINE 5 % EX PTCH: 1.0000 | MEDICATED_PATCH | CUTANEOUS | 0 refills | Status: DC

## 2017-06-29 MED ORDER — IBUPROFEN 600 MG PO TABS: 600.0000 mg | ORAL_TABLET | Freq: Four times a day (QID) | ORAL | 0 refills | Status: DC | PRN
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
## 2017-06-29 NOTE — Discharge Instructions (Signed)
Expect your soreness to increase over the next 2-3 days. Take it easy, but do not lay around too much as this may make any stiffness worse.  Antiinflammatory medications: Take 600 mg of ibuprofen every 6 hours or 440 mg (over the counter dose) to 500 mg (prescription dose) of naproxen every 12 hours for the next 3 days. After this time, these medications may be used as needed for pain. Take these medications with food to avoid upset stomach. Choose only one of these medications, do not take them together.  Tylenol: Should you continue to have additional pain while taking the ibuprofen or naproxen, you may add in tylenol as needed. Your daily total maximum amount of tylenol from all sources should be limited to 4000mg /day for persons without liver problems, or 2000mg /day for those with liver problems. Muscle relaxer: Robaxin is a muscle relaxer and may help loosen stiff muscles. Do not take the Robaxin while driving or performing other dangerous activities.  Lidocaine patches: These are available via either prescription or over-the-counter. The over-the-counter option may be more economical one and are likely just as effective. There are multiple over-the-counter brands, such as Salonpas. Exercises: Be sure to perform the attached exercises starting with three times a week and working up to performing them daily. This is an essential part of preventing long term problems.   Follow up with a primary care provider for any future management of these complaints.  Please follow up with your urologist regarding your bladder stimulator. You may also follow-up with a local urologist on the matter.
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
## 2017-06-29 NOTE — ED Notes (Signed)
Bed: WTR7 Expected date:  Expected time:  Means of arrival:  Comments: 

## 2017-08-30 NOTE — Telephone Encounter (Signed)
This encounter was created in error - please disregard.

## 2017-10-07 ENCOUNTER — Other Ambulatory Visit: Payer: Self-pay

## 2017-10-07 ENCOUNTER — Encounter (HOSPITAL_COMMUNITY): Payer: Self-pay

## 2017-10-07 ENCOUNTER — Emergency Department (HOSPITAL_COMMUNITY): Payer: Self-pay

## 2017-10-07 DIAGNOSIS — Z5321 Procedure and treatment not carried out due to patient leaving prior to being seen by health care provider: Secondary | ICD-10-CM | POA: Insufficient documentation

## 2017-10-07 DIAGNOSIS — R0602 Shortness of breath: Secondary | ICD-10-CM | POA: Insufficient documentation

## 2017-10-07 LAB — BASIC METABOLIC PANEL
Anion gap: 9 (ref 5–15)
BUN: 23 mg/dL — AB (ref 6–20)
CHLORIDE: 102 mmol/L (ref 101–111)
CO2: 26 mmol/L (ref 22–32)
Calcium: 9.1 mg/dL (ref 8.9–10.3)
Creatinine, Ser: 0.97 mg/dL (ref 0.44–1.00)
GFR calc Af Amer: >60 mL/min (ref >60)
GLUCOSE: 164 mg/dL — AB (ref 65–99)
POTASSIUM: 3.5 mmol/L (ref 3.5–5.1)
Sodium: 137 mmol/L (ref 135–145)

## 2017-10-07 LAB — CBC
HEMATOCRIT: 39.8 % (ref 36.0–46.0)
Hemoglobin: 13.1 g/dL (ref 12.0–15.0)
MCH: 29.9 pg (ref 26.0–34.0)
MCHC: 32.9 g/dL (ref 30.0–36.0)
MCV: 90.9 fL (ref 78.0–100.0)
Platelets: 330 10*3/uL (ref 150–400)
RBC: 4.38 MIL/uL (ref 3.87–5.11)
RDW: 12.7 % (ref 11.5–15.5)
WBC: 6.2 10*3/uL (ref 4.0–10.5)

## 2017-10-07 LAB — I-STAT TROPONIN, ED: Troponin i, poc: 0 ng/mL (ref 0.00–0.08)

## 2017-10-07 LAB — I-STAT BETA HCG BLOOD, ED (MC, WL, AP ONLY): I-stat hCG, quantitative: <5 m[IU]/mL (ref <5)

## 2017-10-07 NOTE — ED Triage Notes (Signed)
Per Pt, PT is coming from home with complaints of SOB that has increased since Monday. Has Hx of Respiratory Failure and Asthma. Pt has been trying breathing treatments at home with a small relief.

## 2017-10-08 ENCOUNTER — Emergency Department (HOSPITAL_COMMUNITY)
Admission: EM | Admit: 2017-10-08 | Discharge: 2017-10-08 | Disposition: A | Discharge status: Home / Self Care | Payer: Self-pay | Attending: Emergency Medicine | Admitting: Emergency Medicine

## 2017-10-08 NOTE — ED Notes (Signed)
Pt LWBS 

## 2017-10-09 ENCOUNTER — Encounter (HOSPITAL_COMMUNITY): Payer: Self-pay | Admitting: Emergency Medicine

## 2017-10-09 ENCOUNTER — Other Ambulatory Visit: Payer: Self-pay

## 2017-10-09 ENCOUNTER — Emergency Department (HOSPITAL_COMMUNITY)
Admission: EM | Admit: 2017-10-09 | Discharge: 2017-10-09 | Disposition: A | Discharge status: Home / Self Care | Payer: Self-pay | Attending: Emergency Medicine | Admitting: Emergency Medicine

## 2017-10-09 ENCOUNTER — Encounter: Payer: Self-pay | Admitting: Family Medicine

## 2017-10-09 ENCOUNTER — Emergency Department (HOSPITAL_COMMUNITY): Payer: Self-pay

## 2017-10-09 DIAGNOSIS — J4521 Mild intermittent asthma with (acute) exacerbation: Secondary | ICD-10-CM | POA: Insufficient documentation

## 2017-10-09 DIAGNOSIS — Z79899 Other long term (current) drug therapy: Secondary | ICD-10-CM | POA: Insufficient documentation

## 2017-10-09 DIAGNOSIS — Z7984 Long term (current) use of oral hypoglycemic drugs: Secondary | ICD-10-CM | POA: Insufficient documentation

## 2017-10-09 LAB — CBC
HCT: 41.7 % (ref 36.0–46.0)
Hemoglobin: 14 g/dL (ref 12.0–15.0)
MCH: 30.2 pg (ref 26.0–34.0)
MCHC: 33.6 g/dL (ref 30.0–36.0)
MCV: 89.9 fL (ref 78.0–100.0)
PLATELETS: 336 10*3/uL (ref 150–400)
RBC: 4.64 MIL/uL (ref 3.87–5.11)
RDW: 12.7 % (ref 11.5–15.5)
WBC: 6.3 10*3/uL (ref 4.0–10.5)

## 2017-10-09 LAB — BASIC METABOLIC PANEL
Anion gap: 10 (ref 5–15)
BUN: 18 mg/dL (ref 6–20)
CALCIUM: 9.1 mg/dL (ref 8.9–10.3)
CO2: 20 mmol/L — AB (ref 22–32)
CREATININE: 0.86 mg/dL (ref 0.44–1.00)
Chloride: 107 mmol/L (ref 101–111)
GFR calc non Af Amer: >60 mL/min (ref >60)
Glucose, Bld: 170 mg/dL — ABNORMAL HIGH (ref 65–99)
Potassium: 3.6 mmol/L (ref 3.5–5.1)
SODIUM: 137 mmol/L (ref 135–145)

## 2017-10-09 LAB — I-STAT BETA HCG BLOOD, ED (MC, WL, AP ONLY): I-stat hCG, quantitative: <5 m[IU]/mL (ref <5)

## 2017-10-09 LAB — I-STAT TROPONIN, ED: TROPONIN I, POC: 0 ng/mL (ref 0.00–0.08)

## 2017-10-09 MED ORDER — IPRATROPIUM BROMIDE 0.02 % IN SOLN
0.5000 mg | Freq: Once | RESPIRATORY_TRACT | Status: AC
  Administered 2017-10-09: 0.5 mg via RESPIRATORY_TRACT
  Filled 2017-10-09: qty 2.5

## 2017-10-09 MED ORDER — ALBUTEROL SULFATE (2.5 MG/3ML) 0.083% IN NEBU
5.0000 mg | INHALATION_SOLUTION | Freq: Once | RESPIRATORY_TRACT | Status: AC
  Administered 2017-10-09: 5 mg via RESPIRATORY_TRACT
  Filled 2017-10-09: qty 6

## 2017-10-09 MED ORDER — ACETAMINOPHEN 500 MG PO TABS
1000.0000 mg | ORAL_TABLET | Freq: Once | ORAL | Status: AC
Start: 2017-10-09 — End: 2017-10-09
  Administered 2017-10-09: 1000 mg via ORAL
  Filled 2017-10-09: qty 2

## 2017-10-09 MED ORDER — METHYLPREDNISOLONE SODIUM SUCC 125 MG IJ SOLR
125.0000 mg | Freq: Once | INTRAMUSCULAR | Status: AC
  Administered 2017-10-09: 125 mg via INTRAVENOUS
  Filled 2017-10-09: qty 2

## 2017-10-09 MED ORDER — ALBUTEROL SULFATE HFA 108 (90 BASE) MCG/ACT IN AERS
2.0000 | INHALATION_SPRAY | Freq: Once | RESPIRATORY_TRACT | Status: AC
  Administered 2017-10-09: 2 via RESPIRATORY_TRACT
  Filled 2017-10-09: qty 6.7

## 2017-10-09 MED ORDER — PREDNISONE 50 MG PO TABS: 50.0000 mg | ORAL_TABLET | Freq: Every day | ORAL | 0 refills | Status: DC

## 2017-10-09 NOTE — ED Notes (Signed)
Patient verbalizes understanding of discharge instructions. Opportunity for questioning and answers were provided. Armband removed by staff, pt discharged from ED ambulatory.   

## 2017-10-09 NOTE — Discharge Instructions (Signed)
Return to the ED with any concerns including difficulty breathing despite using albuterol every 4 hours, not drinking fluids, decreased urine output, vomiting and not able to keep down liquids or medications, decreased level of alertness/lethargy, or any other alarming symptoms °

## 2017-10-09 NOTE — ED Notes (Signed)
Patient was ambulated on RA. O2 was 93% and HR was 111.

## 2017-10-09 NOTE — ED Provider Notes (Signed)
[facility] EMERGENCY DEPARTMENT Provider Note   CSN: [id number] Arrival date & time: 10/09/17  1347     History   Chief Complaint Chief Complaint  Patient presents with  . Chest Pain  . Shortness of Breath  . Loss of Consciousness    HPI Michelle Gibson is a 41 y.o. female.  HPI  Patient with history of asthma presents with complaint of cough congestion and wheezing over the past week or so.  She has had a low-grade fever T-max of 99.  She feels dizzy and lightheaded when she coughs.  She also describes chest pain that is worse with coughing.  She has had a decreased appetite but has not had any vomiting or diarrhea and has continued to drink liquids well.  She does not know of any specific sick contacts. There are no other associated systemic symptoms, there are no other alleviating or modifying factors.   Past Medical History:  Diagnosis Date  . Asthma   . WPW (Wolff-Parkinson-White syndrome)     Patient Active Problem List   Diagnosis Date Noted  . Asthma with acute exacerbation 09/15/2016  . Hyperglycemia 09/15/2016  . History of Wolff-Parkinson-White (WPW) syndrome 09/15/2016  . Acute respiratory failure with hypoxia ([place]) 09/15/2016  . Bronchospasm 09/11/2016    Past Surgical History:  Procedure Laterality Date  . ABDOMINAL HYSTERECTOMY    . CARDIAC SURGERY    . CESAREAN SECTION      OB History    No data available       Home Medications    Prior to Admission medications   Medication Sig Start Date End Date Taking? Authorizing Provider  albuterol (PROVENTIL HFA;VENTOLIN HFA) 108 (90 Base) MCG/ACT inhaler Inhale 2 puffs into the lungs every 4 (four) hours as needed for wheezing or shortness of breath. 10/18/16  Yes Tresa Garter, MD  albuterol (PROVENTIL) (2.5 MG/3ML) 0.083% nebulizer solution Take 3 mLs (2.5 mg total) by nebulization every 6 (six) hours as needed for wheezing or shortness of breath. 09/24/16  Yes McClung, Angela M,  PA-C  Pseudoeph-Doxylamine-DM-APAP (NYQUIL PO) Take 2 tablets by mouth at bedtime as needed (cold, cough and sleep).   Yes [provider]  Pseudoephedrine-APAP-DM (DAYQUIL PO) Take 2 tablets by mouth every 8 (eight) hours as needed (cold and cough).   Yes [provider]  escitalopram (LEXAPRO) 10 MG tablet Take 1 tablet (10 mg total) by mouth daily. After 1 week may increase to 2 tablets (20 mg total) by mouth daily. Patient not taking: Reported on 12/21/2016 11/13/16   Alfonse Spruce, FNP  gabapentin (NEURONTIN) 100 MG capsule Take 1 capsule (100 mg total) by mouth 3 (three) times daily. Patient not taking: Reported on 10/09/2017 10/20/16   Alfonse Spruce, FNP  metFORMIN (GLUCOPHAGE) 500 MG tablet Take 1 tablet (500 mg total) by mouth 2 (two) times daily with a meal. Patient not taking: Reported on 10/09/2017 09/24/16   Argentina Donovan, PA-C  mometasone-formoterol (DULERA) 200-5 MCG/ACT AERO Inhale 2 puffs into the lungs 2 (two) times daily. Patient not taking: Reported on 10/09/2017 10/18/16   Tresa Garter, MD  predniSONE (DELTASONE) 50 MG tablet Take 1 tablet (50 mg total) by mouth daily. 10/09/17   MabeForbes Cellar, MD    Family History No family history on file.  Social History Social History   Tobacco Use  . Smoking status: Never Smoker  . Smokeless tobacco: Never Used  Substance Use Topics  . Alcohol use:  No  . Drug use: No     Allergies   Ciprofloxacin and Flecainide   Review of Systems Review of Systems  ROS reviewed and all otherwise negative except for mentioned in HPI   Physical Exam Updated Vital Signs BP 113/74   Pulse 80   Temp 98.8 F (37.1 C) (Oral)   Resp 13   Ht 5\' 6"  (1.676 m)   Wt 72.1 kg (159 lb)   SpO2 99%   BMI 25.66 kg/m  Vitals reviewed Physical Exam  Physical Examination: General appearance - alert, well appearing, and in no distress Mental status - alert, oriented to person, place, and time Eyes -no  conjunctival injection, no scleral icterus Mouth - mucous membranes moist, pharynx normal without lesions Neck - supple, no significant adenopathy Chest - BSS, bilateral expiratory wheezing Heart - normal rate, regular rhythm, normal S1, S2, no murmurs, rubs, clicks or gallops Abdomen - soft, nontender, nondistended, no masses or organomegaly Neurological - alert, oriented, normal speech, no focal findings or movement disorder noted Extremities - peripheral pulses normal, no pedal edema, no clubbing or cyanosis Skin - normal coloration and turgor, no rashes, no suspicious skin lesions noted   ED Treatments / Results  Labs (all labs ordered are listed, but only abnormal results are displayed) Labs Reviewed  BASIC METABOLIC PANEL - Abnormal; Notable for the following components:      Result Value   CO2 20 (*)    Glucose, Bld 170 (*)    All other components within normal limits  CBC  I-STAT TROPONIN, ED  I-STAT BETA HCG BLOOD, ED ([facility], [facility], [facility] ONLY)    EKG  EKG Interpretation  Date/Time:  Sunday October 09 2017 18:06:48 EST Ventricular Rate:  82 PR Interval:  114 QRS Duration: 117 QT Interval:  392 QTC Calculation: 458 R Axis:   59 Text Interpretation:  Sinus rhythm Nonspecific intraventricular conduction delay Borderline repolarization abnormality No significant change since last tracing Confirmed by Alfonzo Beers [phone]) on 10/09/2017 6:12:46 PM       Radiology Dg Chest 2 View  Result Date: 10/09/2017 CLINICAL DATA:  Shortness of breath EXAM: CHEST  2 VIEW COMPARISON:  October 07, 2017 FINDINGS: The heart size and mediastinal contours are within normal limits. Both lungs are clear. The visualized skeletal structures are unremarkable. IMPRESSION: No active cardiopulmonary disease. Electronically Signed   By: Abelardo Diesel M.D.   On: 10/09/2017 15:08    Procedures Procedures (including critical care time)  Medications Ordered in ED Medications  albuterol (PROVENTIL)  (2.5 MG/3ML) 0.083% nebulizer solution 5 mg (5 mg Nebulization Given 10/09/17 1645)  ipratropium (ATROVENT) nebulizer solution 0.5 mg (0.5 mg Nebulization Given 10/09/17 1645)  methylPREDNISolone sodium succinate (SOLU-MEDROL) 125 mg/2 mL injection 125 mg (125 mg Intravenous Given 10/09/17 1645)  acetaminophen (TYLENOL) tablet 1,000 mg (1,000 mg Oral Given 10/09/17 1710)  albuterol (PROVENTIL) (2.5 MG/3ML) 0.083% nebulizer solution 5 mg (5 mg Nebulization Given 10/09/17 1710)  ipratropium (ATROVENT) nebulizer solution 0.5 mg (0.5 mg Nebulization Given 10/09/17 1710)  albuterol (PROVENTIL) (2.5 MG/3ML) 0.083% nebulizer solution 5 mg (5 mg Nebulization Given 10/09/17 1812)  ipratropium (ATROVENT) nebulizer solution 0.5 mg (0.5 mg Nebulization Given 10/09/17 1812)  albuterol (PROVENTIL HFA;VENTOLIN HFA) 108 (90 Base) MCG/ACT inhaler 2 puff (2 puffs Inhalation Given 10/09/17 2003)     Initial Impression / Assessment and Plan / ED Course  I have reviewed the triage vital signs and the nursing notes.  Pertinent labs & imaging results that were available  during my care of the patient were reviewed by me and considered in my medical decision making (see chart for details).   after 2nd neb, patient has better air movement but continued wheezing.  Will give 3rd neb and then ambulate with pulse ox.    7:58 PM  O2 sat maintained at 93% with ambulation.  Will discharge with rx for prednsione and to continue albuterol every 4 hours at home.     Final Clinical Impressions(s) / ED Diagnoses   Final diagnoses:  Exacerbation of intermittent asthma, unspecified asthma severity    ED Discharge Orders        Ordered    predniSONE (DELTASONE) 50 MG tablet  Daily     10/09/17 2000       Pixie Casino, MD 10/09/17 2121

## 2017-10-09 NOTE — ED Notes (Signed)
pts family out of room to nurses station reporting her O2 sats dropping per bedside cardiac monitor; I went in to assess patient; she was lying in bed awake talking to visitor; Sats noted at 85% and then quickly returned to 99%; pt states she felt lightheaded also; new ECG captured and given to Linker, MD

## 2017-10-09 NOTE — ED Notes (Signed)
ED Provider at bedside. 

## 2017-10-09 NOTE — ED Triage Notes (Signed)
Pt. Stated, Im having SOB with chest pain with fainting spell where Im completely out. The chest pain started 3-4 days ago. I was here on Friday and said my EKG was abnormal on Friday. I was here. I've had  SOB for over a year.

## 2017-10-10 DIAGNOSIS — I456 Pre-excitation syndrome: Secondary | ICD-10-CM | POA: Insufficient documentation

## 2017-10-17 ENCOUNTER — Ambulatory Visit: Payer: Self-pay | Attending: Family Medicine | Admitting: Family Medicine

## 2017-10-17 ENCOUNTER — Encounter: Payer: Self-pay | Admitting: Family Medicine

## 2017-10-17 VITALS — BP 96/61 | HR 60 | Temp 98.2°F | Resp 18 | Ht 66.0 in | Wt 164.0 lb

## 2017-10-17 DIAGNOSIS — Z87898 Personal history of other specified conditions: Secondary | ICD-10-CM

## 2017-10-17 DIAGNOSIS — E119 Type 2 diabetes mellitus without complications: Secondary | ICD-10-CM | POA: Insufficient documentation

## 2017-10-17 DIAGNOSIS — J3489 Other specified disorders of nose and nasal sinuses: Secondary | ICD-10-CM

## 2017-10-17 DIAGNOSIS — J4551 Severe persistent asthma with (acute) exacerbation: Secondary | ICD-10-CM

## 2017-10-17 DIAGNOSIS — R05 Cough: Secondary | ICD-10-CM | POA: Insufficient documentation

## 2017-10-17 DIAGNOSIS — R0989 Other specified symptoms and signs involving the circulatory and respiratory systems: Secondary | ICD-10-CM

## 2017-10-17 DIAGNOSIS — F411 Generalized anxiety disorder: Secondary | ICD-10-CM

## 2017-10-17 DIAGNOSIS — F5102 Adjustment insomnia: Secondary | ICD-10-CM

## 2017-10-17 DIAGNOSIS — Z79899 Other long term (current) drug therapy: Secondary | ICD-10-CM | POA: Insufficient documentation

## 2017-10-17 DIAGNOSIS — R0789 Other chest pain: Secondary | ICD-10-CM | POA: Insufficient documentation

## 2017-10-17 DIAGNOSIS — Z7984 Long term (current) use of oral hypoglycemic drugs: Secondary | ICD-10-CM | POA: Insufficient documentation

## 2017-10-17 DIAGNOSIS — R0981 Nasal congestion: Secondary | ICD-10-CM

## 2017-10-17 LAB — POCT GLYCOSYLATED HEMOGLOBIN (HGB A1C): HEMOGLOBIN A1C: 5.7

## 2017-10-17 MED ORDER — ESCITALOPRAM OXALATE 10 MG PO TABS: 10.0000 mg | ORAL_TABLET | Freq: Every day | ORAL | 1 refills | Status: DC

## 2017-10-17 MED ORDER — PHENYLEPHRINE-CHLORPHEN-DM 5-2-15 MG/5ML PO SYRP: 5.0000 mL | ORAL_SOLUTION | ORAL | 0 refills | Status: DC | PRN

## 2017-10-17 MED ORDER — PREDNISONE 20 MG PO TABS: 40.0000 mg | ORAL_TABLET | Freq: Every day | ORAL | 0 refills | Status: DC

## 2017-10-17 MED ORDER — OXYMETAZOLINE HCL 0.05 % NA SOLN: 1.0000 | Freq: Two times a day (BID) | NASAL | 0 refills | Status: AC | PRN

## 2017-10-17 MED ORDER — TRAZODONE HCL 50 MG PO TABS: 25.0000 mg | ORAL_TABLET | Freq: Every evening | ORAL | 0 refills | Status: DC | PRN

## 2017-10-17 MED ORDER — IPRATROPIUM-ALBUTEROL 0.5-2.5 (3) MG/3ML IN SOLN: 3.0000 mL | RESPIRATORY_TRACT | Status: DC | PRN

## 2017-10-17 MED ORDER — GUAIFENESIN ER 600 MG PO TB12: 600.0000 mg | ORAL_TABLET | Freq: Two times a day (BID) | ORAL | 0 refills | Status: DC

## 2017-10-17 MED ORDER — ALBUTEROL SULFATE HFA 108 (90 BASE) MCG/ACT IN AERS: 2.0000 | INHALATION_SPRAY | RESPIRATORY_TRACT | 3 refills | Status: DC | PRN

## 2017-10-17 MED ORDER — MOMETASONE FURO-FORMOTEROL FUM 200-5 MCG/ACT IN AERO: 2.0000 | INHALATION_SPRAY | Freq: Two times a day (BID) | RESPIRATORY_TRACT | 3 refills | Status: DC

## 2017-10-17 NOTE — Progress Notes (Signed)
Subjective:  Patient ID: Michelle Gibson, female    DOB: 1976-01-31  Age: 42 y.o. MRN: [id number]  CC: Hospitalization Follow-up   HPI Larina Lieurance presents for complaints of chest tightness and cough.Onset 1 week ago. Symptoms include cough and chest tightness. Associated symptoms include rhinorrhea, frontal sinus pressure, fatigue.Some relief with use of steroids and albuterol. She denies any fevers. 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 Reports having inhalers and neb treatments. History of anxiety. Symptoms difficulty staying asleep. She reports awakenings about every hour. She denies taking anything for symptoms. Risk factors: negative event-she reports her sister committed suicide 2 weeks ago. She is agreeable to speaking with LCSW. She is agreeable to restarting Lexapro use.    Outpatient Medications Prior to Visit  Medication Sig Dispense Refill  . albuterol (PROVENTIL HFA;VENTOLIN HFA) 108 (90 Base) MCG/ACT inhaler Inhale 2 puffs into the lungs every 4 (four) hours as needed for wheezing or shortness of breath. 54 g 3  . albuterol (PROVENTIL) (2.5 MG/3ML) 0.083% nebulizer solution Take 3 mLs (2.5 mg total) by nebulization every 6 (six) hours as needed for wheezing or shortness of breath. 75 mL 12  . predniSONE (DELTASONE) 50 MG tablet Take 1 tablet (50 mg total) by mouth daily. 4 tablet 0  . Pseudoeph-Doxylamine-DM-APAP (NYQUIL PO) Take 2 tablets by mouth at bedtime as needed (cold, cough and sleep).    . Pseudoephedrine-APAP-DM (DAYQUIL PO) Take 2 tablets by mouth every 8 (eight) hours as needed (cold and cough).    . gabapentin (NEURONTIN) 100 MG capsule Take 1 capsule (100 mg total) by mouth 3 (three) times daily. (Patient not taking: Reported on 10/09/2017) 30 capsule 2  . metFORMIN (GLUCOPHAGE) 500 MG tablet Take 1 tablet (500 mg total) by mouth 2 (two) times daily with a meal. (Patient not taking: Reported on 10/09/2017) 180 tablet 1  . escitalopram  (LEXAPRO) 10 MG tablet Take 1 tablet (10 mg total) by mouth daily. After 1 week may increase to 2 tablets (20 mg total) by mouth daily. (Patient not taking: Reported on 12/21/2016) 90 tablet 0  . mometasone-formoterol (DULERA) 200-5 MCG/ACT AERO Inhale 2 puffs into the lungs 2 (two) times daily. (Patient not taking: Reported on 10/09/2017) 39 Inhaler 3   Facility-Administered Medications Prior to Visit  Medication Dose Route Frequency Provider Last Rate Last Dose  . ipratropium-albuterol (DUONEB) 0.5-2.5 (3) MG/3ML nebulizer solution 3 mL  3 mL Nebulization Q20 Min PRN Porcia Morganti R, FNP       ROS Review of Systems  Constitutional: Positive for fatigue.  HENT: Positive for congestion, rhinorrhea and sinus pressure.   Eyes: Negative for discharge.  Respiratory: Positive for cough, chest tightness and shortness of breath.   Cardiovascular: Negative.   Gastrointestinal: Negative.   Musculoskeletal: Negative.   Psychiatric/Behavioral: Positive for sleep disturbance. The patient is nervous/anxious.    Objective:  BP 96/61 (BP Location: Left Arm, Patient Position: Sitting, Cuff Size: Normal)   Pulse 60   Temp 98.2 F (36.8 C) (Oral)   Resp 18   Ht 5\' 6"  (1.676 m)   Wt 164 lb (74.4 kg)   SpO2 100%   BMI 26.47 kg/m   BP/Weight 10/17/2017 10/09/2017 67/89/3810  Systolic BP 96 175 102  Diastolic BP 61 74 77  Wt. (Lbs) 164 159 -  BMI 26.47 25.66 25.66   Physical Exam  Constitutional: She is oriented to person, place, and time. She appears well-developed and well-nourished. She appears toxic.  HENT:  Head: Normocephalic and atraumatic.  Right Ear: External ear normal.  Left Ear: External ear normal.  Nose: Mucosal edema and rhinorrhea present.  Mouth/Throat: Oropharynx is clear and moist.  Eyes: Conjunctivae are normal. Right eye exhibits no discharge. Left eye exhibits no discharge.  Neck: Normal range of motion. Neck supple.  Cardiovascular: Normal rate, regular rhythm, normal  heart sounds and intact distal pulses.  Pulmonary/Chest: Effort normal. No respiratory distress. She has no wheezes. She has rhonchi (throughout all lung fields).  Abdominal: Soft. Bowel sounds are normal.  Lymphadenopathy:    She has no cervical adenopathy.  Neurological: She is alert and oriented to person, place, and time.  Skin: Skin is warm and dry.  Psychiatric: Her mood appears anxious. She expresses no homicidal and no suicidal ideation. She expresses no suicidal plans and no homicidal plans. She is communicative.  Tearful She is attentive.   Assessment & Plan:   1. Severe persistent asthma with acute exacerbation  - mometasone-formoterol (DULERA) 200-5 MCG/ACT AERO; Inhale 2 puffs into the lungs 2 (two) times daily.  Dispense: 39 Inhaler; Refill: 3 - albuterol (PROVENTIL HFA;VENTOLIN HFA) 108 (90 Base) MCG/ACT inhaler; Inhale 2 puffs into the lungs every 4 (four) hours as needed for wheezing or shortness of breath.  Dispense: 54 g; Refill: 3 - predniSONE (DELTASONE) 20 MG tablet; Take 2 tablets (40 mg total) by mouth daily.  Dispense: 10 tablet; Refill: 0 - ipratropium-albuterol (DUONEB) 0.5-2.5 (3) MG/3ML nebulizer solution 3 mL  2. Generalized anxiety disorder LCSW out of office, pt.provided with card and therapy resource info. - escitalopram (LEXAPRO) 10 MG tablet; Take 1 tablet (10 mg total) by mouth daily. After 1 week may increase to 2 tablets (20 mg total) by mouth daily.  Dispense: 30 tablet; Refill: 1  3. Adjustment insomnia  - traZODone (DESYREL) 50 MG tablet; Take 0.5-1 tablets (25-50 mg total) by mouth at bedtime as needed for sleep.  Dispense: 30 tablet; Refill: 0  4. Sinus pressure  - Phenylephrine-Chlorphen-DM 02-09-14 MG/5ML SYRP; Take 5 mLs by mouth every 4 (four) hours as needed.  Dispense: 1 Bottle; Refill: 0 - oxymetazoline (AFRIN SINUS) 0.05 % nasal spray; Place 1 spray into both nostrils 2 (two) times daily as needed for up to 3 days for congestion.   Dispense: 30 mL; Refill: 0  5. Nasal congestion with rhinorrhea  - Phenylephrine-Chlorphen-DM 02-09-14 MG/5ML SYRP; Take 5 mLs by mouth every 4 (four) hours as needed.  Dispense: 1 Bottle; Refill: 0 - oxymetazoline (AFRIN SINUS) 0.05 % nasal spray; Place 1 spray into both nostrils 2 (two) times daily as needed for up to 3 days for congestion.  Dispense: 30 mL; Refill: 0  6. Rhonchi  - ipratropium-albuterol (DUONEB) 0.5-2.5 (3) MG/3ML nebulizer solution 3 mL - guaiFENesin (MUCINEX) 600 MG 12 hr tablet; Take 1 tablet (600 mg total) by mouth 2 (two) times daily.  Dispense: 14 tablet; Refill: 0  7. History of prediabetes  - POCT glycosylated hemoglobin (Hb A1C)    Follow-up: Return in about 4 weeks (around 11/14/2017) for Depression.   Alfonse Spruce FNP

## 2017-10-17 NOTE — Progress Notes (Signed)
5

## 2017-11-10 ENCOUNTER — Ambulatory Visit: Payer: Self-pay | Attending: Family Medicine | Admitting: Physician Assistant

## 2017-11-10 VITALS — BP 109/69 | HR 83 | Temp 98.7°F | Resp 18 | Ht 66.0 in | Wt 164.0 lb

## 2017-11-10 DIAGNOSIS — Z2089 Contact with and (suspected) exposure to other communicable diseases: Secondary | ICD-10-CM | POA: Insufficient documentation

## 2017-11-10 DIAGNOSIS — J9801 Acute bronchospasm: Secondary | ICD-10-CM | POA: Insufficient documentation

## 2017-11-10 DIAGNOSIS — Z7984 Long term (current) use of oral hypoglycemic drugs: Secondary | ICD-10-CM | POA: Insufficient documentation

## 2017-11-10 DIAGNOSIS — Z881 Allergy status to other antibiotic agents status: Secondary | ICD-10-CM | POA: Insufficient documentation

## 2017-11-10 DIAGNOSIS — Z79899 Other long term (current) drug therapy: Secondary | ICD-10-CM | POA: Insufficient documentation

## 2017-11-10 DIAGNOSIS — J111 Influenza due to unidentified influenza virus with other respiratory manifestations: Secondary | ICD-10-CM | POA: Insufficient documentation

## 2017-11-10 DIAGNOSIS — I456 Pre-excitation syndrome: Secondary | ICD-10-CM | POA: Insufficient documentation

## 2017-11-10 MED ORDER — ALBUTEROL SULFATE (2.5 MG/3ML) 0.083% IN NEBU
2.5000 mg | INHALATION_SOLUTION | Freq: Once | RESPIRATORY_TRACT | Status: AC
  Administered 2017-11-10: 2.5 mg via RESPIRATORY_TRACT

## 2017-11-10 MED ORDER — OSELTAMIVIR PHOSPHATE 75 MG PO CAPS
75.0000 mg | ORAL_CAPSULE | Freq: Two times a day (BID) | ORAL | 0 refills | Status: DC
Start: 2017-11-10 — End: 2017-12-06

## 2017-11-10 MED ORDER — METHYLPREDNISOLONE SODIUM SUCC 125 MG IJ SOLR
125.0000 mg | Freq: Once | INTRAMUSCULAR | Status: AC
  Administered 2017-11-10: 125 mg via INTRAMUSCULAR

## 2017-11-10 MED ORDER — PREDNISONE 10 MG PO TABS: ORAL_TABLET | ORAL | 0 refills | Status: DC

## 2017-11-10 MED ORDER — BENZONATATE 100 MG PO CAPS: 200.0000 mg | ORAL_CAPSULE | Freq: Three times a day (TID) | ORAL | 0 refills | Status: DC | PRN

## 2017-11-10 MED FILL — BENZONATATE 100 MG CAPSULE: 100 | 13 days supply | Qty: 40 | Fill #0

## 2017-11-10 MED FILL — predniSONE 10 MG TABS: 10 | 6 days supply | Qty: 21 | Fill #0

## 2017-11-10 MED FILL — ?OSELTAMIVIR PHOS 75 MG CAP: 75 | 5 days supply | Qty: 10 | Fill #0

## 2017-11-10 NOTE — Progress Notes (Signed)
Patient is here for a fever.   Patient stated she is feeling fatigue since Monday and still having body aches.   Patient took Nyquil OTC.

## 2017-11-10 NOTE — Progress Notes (Signed)
Patient ID: Michelle Gibson, female   DOB: 02-May-1976, 42 y.o.   MRN: [id number]     Michelle Gibson, is a 42 y.o. female  [id number]  [id number]  DOB - Apr 21, 1976  Subjective:  Chief Complaint and HPI: Michelle Gibson is a 43 y.o. female here today Fever started Monday night.  + cough and congestion. +nausea, no vomiting or diarrhea.  Onset was sudden.  Got a flu shot on 10/15/2017.  She has a friend who tested positive flu.     ROS:   Constitutional:  No f/c, No night sweats, No unexplained weight loss. EENT:  No vision changes, No blurry vision, No hearing changes. No mouth, throat, or ear problems.  Respiratory: + cough, + SOB Cardiac: No CP, no palpitations GI:  No abd pain, + N no V/D. GU: No Urinary s/sx Musculoskeletal: No joint pain Neuro: No headache, no dizziness, no motor weakness.  Skin: No rash Endocrine:  No polydipsia. No polyuria.  Psych: Denies SI/HI  No problems updated.  ALLERGIES: Allergies  Allergen Reactions  . Ciprofloxacin Hives  . Flecainide Hives, Nausea And Vomiting and Rash    PAST MEDICAL HISTORY: Past Medical History:  Diagnosis Date  . Asthma   . WPW (Wolff-Parkinson-White syndrome)     MEDICATIONS AT HOME: Prior to Admission medications   Medication Sig Start Date End Date Taking? Authorizing Provider  albuterol (PROVENTIL HFA;VENTOLIN HFA) 108 (90 Base) MCG/ACT inhaler Inhale 2 puffs into the lungs every 4 (four) hours as needed for wheezing or shortness of breath. 10/17/17  Yes Hairston, Mandesia R, FNP  mometasone-formoterol (DULERA) 200-5 MCG/ACT AERO Inhale 2 puffs into the lungs 2 (two) times daily. 10/17/17  Yes Hairston, Maylon Peppers, FNP  benzonatate (TESSALON) 100 MG capsule Take 2 capsules (200 mg total) by mouth 3 (three) times daily as needed for cough. 11/10/17   Argentina Donovan, PA-C  metFORMIN (GLUCOPHAGE) 500 MG tablet Take 1 tablet (500 mg total) by mouth 2 (two) times daily with a meal. Patient not taking: Reported on  10/09/2017 09/24/16   Argentina Donovan, PA-C  oseltamivir (TAMIFLU) 75 MG capsule Take 1 capsule (75 mg total) by mouth 2 (two) times daily. 11/10/17   Argentina Donovan, PA-C  predniSONE (DELTASONE) 10 MG tablet 6,5,4,3,2,1 take each days dose in am with food. 11/10/17   Argentina Donovan, PA-C     Objective:  EXAM:   Vitals:   11/10/17 1046  BP: 109/69  Pulse: 83  Resp: 18  Temp: 98.7 F (37.1 C)  TempSrc: Oral  SpO2: 93%  Weight: 164 lb (74.4 kg)  Height: 5\' 6"  (1.676 m)    General appearance : A&OX3. NAD. Non-toxic-appearing HEENT: Atraumatic and Normocephalic.  PERRLA. EOM intact.  TM full B. Mouth-MMM, post pharynx w/ mild erythema, + PND. Neck: supple, no JVD. No cervical lymphadenopathy. No thyromegaly Chest/Lungs:  Breathing-non-labored, fair air movement.  There is diffuse moderate wheezing throughout, no rales, no rhonchi Abdomen: Bowel sounds present, Non tender and not distended with no gaurding, rigidity or rebound. Extremities: Bilateral Lower Ext shows no edema, both legs are warm to touch with = pulse throughout Neurology:  CN II-XII grossly intact, Non focal.   Psych:  TP linear. J/I WNL. Normal speech. Appropriate eye contact and affect.  Skin:  No Rash  Data Review Lab Results  Component Value Date   HGBA1C 5.7 10/17/2017   HGBA1C 6.3 (H) 09/15/2016     Assessment & Plan   1. Bronchospasm secondary to flu -  125mg  solumedrol IM now(reviewed labs-blood sugars have been stable) - albuterol (PROVENTIL) (2.5 MG/3ML) 0.083% nebulizer solution 2.5 mg-pulse ox rested around 93% - predniSONE (DELTASONE) 10 MG tablet; 6,5,4,3,2,1 take each days dose in am with food.  Dispense: 21 tablet; Refill: 0 Go to the emergency department if you worsen.  Concern for respiratory worsening due to history of respiratory failure.  If she worsens, she has agreed to call 911.    2. Influenza-recent exposure.   Fluids, rest, respiratory care.  To ED if worsens - oseltamivir  (TAMIFLU) 75 MG capsule; Take 1 capsule (75 mg total) by mouth 2 (two) times daily.  Dispense: 10 capsule; Refill: 0 - benzonatate (TESSALON) 100 MG capsule; Take 2 capsules (200 mg total) by mouth 3 (three) times daily as needed for cough.  Dispense: 40 capsule; Refill: 0   Patient have been counseled extensively about nutrition and exercise  Return for keep 11/14/2017 appt with [facility].  The patient was given clear instructions to go to ER or return to medical center if symptoms don't improve, worsen or new problems develop. The patient verbalized understanding. The patient was told to call to get lab results if they haven't heard anything in the next week.     Freeman Caldron, PA-C [facility] [place], [place]   11/10/2017, 11:13 AM

## 2017-11-10 NOTE — Patient Instructions (Addendum)
Use advil or tylenol for body aches.  Make sure you stay hydrated.  Go to the emergency department if you worsen.     Influenza, Adult Influenza ("the flu") is an infection in the lungs, nose, and throat (respiratory tract). It is caused by a virus. The flu causes many common cold symptoms, as well as a high fever and body aches. It can make you feel very sick. The flu spreads easily from person to person (is contagious). Getting a flu shot (influenza vaccination) every year is the best way to prevent the flu. Follow these instructions at home:  Take over-the-counter and prescription medicines only as told by your doctor.  Use a cool mist humidifier to add moisture (humidity) to the air in your home. This can make it easier to breathe.  Rest as needed.  Drink enough fluid to keep your pee (urine) clear or pale yellow.  Cover your mouth and nose when you cough or sneeze.  Wash your hands with soap and water often, especially after you cough or sneeze. If you cannot use soap and water, use hand sanitizer.  Stay home from work or school as told by your doctor. Unless you are visiting your doctor, try to avoid leaving home until your fever has been gone for 24 hours without the use of medicine.  Keep all follow-up visits as told by your doctor. This is important. How is this prevented?  Getting a yearly (annual) flu shot is the best way to avoid getting the flu. You may get the flu shot in late summer, fall, or winter. Ask your doctor when you should get your flu shot.  Wash your hands often or use hand sanitizer often.  Avoid contact with people who are sick during cold and flu season.  Eat healthy foods.  Drink plenty of fluids.  Get enough sleep.  Exercise regularly. Contact a doctor if:  You get new symptoms.  You have: ? Chest pain. ? Watery poop (diarrhea). ? A fever.  Your cough gets worse.  You start to have more mucus.  You feel sick to your stomach  (nauseous).  You throw up (vomit). Get help right away if:  You start to be short of breath or have trouble breathing.  Your skin or nails turn a bluish color.  You have very bad pain or stiffness in your neck.  You get a sudden headache.  You get sudden pain in your face or ear.  You cannot stop throwing up. This information is not intended to replace advice given to you by your health care provider. Make sure you discuss any questions you have with your health care provider. Document Released: 07/06/2008 Document Revised: 03/04/2016 Document Reviewed: 07/22/2015 Elsevier Interactive Patient Education  2017 [employer].

## 2017-11-14 ENCOUNTER — Ambulatory Visit: Payer: Self-pay | Attending: Family Medicine | Admitting: Family Medicine

## 2017-11-14 ENCOUNTER — Encounter: Payer: Self-pay | Admitting: Family Medicine

## 2017-11-14 VITALS — BP 121/80 | HR 74 | Temp 98.2°F | Resp 18 | Ht 66.0 in | Wt 167.8 lb

## 2017-11-14 DIAGNOSIS — J4551 Severe persistent asthma with (acute) exacerbation: Secondary | ICD-10-CM

## 2017-11-14 DIAGNOSIS — Z79899 Other long term (current) drug therapy: Secondary | ICD-10-CM | POA: Insufficient documentation

## 2017-11-14 DIAGNOSIS — R509 Fever, unspecified: Secondary | ICD-10-CM

## 2017-11-14 DIAGNOSIS — Z299 Encounter for prophylactic measures, unspecified: Secondary | ICD-10-CM

## 2017-11-14 DIAGNOSIS — B379 Candidiasis, unspecified: Secondary | ICD-10-CM | POA: Insufficient documentation

## 2017-11-14 DIAGNOSIS — R0602 Shortness of breath: Secondary | ICD-10-CM

## 2017-11-14 DIAGNOSIS — Z7984 Long term (current) use of oral hypoglycemic drugs: Secondary | ICD-10-CM | POA: Insufficient documentation

## 2017-11-14 DIAGNOSIS — F418 Other specified anxiety disorders: Secondary | ICD-10-CM

## 2017-11-14 DIAGNOSIS — J069 Acute upper respiratory infection, unspecified: Secondary | ICD-10-CM

## 2017-11-14 MED ORDER — GUAIFENESIN ER 600 MG PO TB12: 1200.0000 mg | ORAL_TABLET | Freq: Two times a day (BID) | ORAL | 0 refills | Status: DC

## 2017-11-14 MED ORDER — IPRATROPIUM-ALBUTEROL 0.5-2.5 (3) MG/3ML IN SOLN
3.0000 mL | RESPIRATORY_TRACT | Status: DC | PRN
  Administered 2017-11-14: 3 mL via RESPIRATORY_TRACT

## 2017-11-14 MED ORDER — ACETAMINOPHEN 325 MG PO TABS: 650.0000 mg | ORAL_TABLET | Freq: Four times a day (QID) | ORAL | Status: DC | PRN

## 2017-11-14 MED ORDER — AZITHROMYCIN 250 MG PO TABS: ORAL_TABLET | ORAL | 0 refills | Status: DC

## 2017-11-14 MED ORDER — IPRATROPIUM-ALBUTEROL 0.5-2.5 (3) MG/3ML IN SOLN: 3.0000 mL | Freq: Four times a day (QID) | RESPIRATORY_TRACT | 1 refills | Status: DC | PRN

## 2017-11-14 MED ORDER — FLUCONAZOLE 150 MG PO TABS: 150.0000 mg | ORAL_TABLET | Freq: Once | ORAL | 0 refills | Status: AC

## 2017-11-14 MED ORDER — PREDNISONE 10 MG PO TABS: ORAL_TABLET | ORAL | 0 refills | Status: DC

## 2017-11-14 MED ORDER — PULSE OXIMETER FOR FINGER MISC: 1.0000 | Freq: Once | 0 refills | Status: AC

## 2017-11-14 MED FILL — IPRAT-ALBUT 0.5-3(2.5) MG/3: 0.5-2.5 (3) | 15 days supply | Qty: 180 | Fill #0

## 2017-11-14 MED FILL — AZITHROMYCIN 250 MG TABLET: 250 | 5 days supply | Qty: 6 | Fill #0

## 2017-11-14 NOTE — Patient Instructions (Signed)
Upper Respiratory Infection, Adult Most upper respiratory infections (URIs) are caused by a virus. A URI affects the nose, throat, and upper air passages. The most common type of URI is often called "the common cold." Follow these instructions at home:  Take medicines only as told by your doctor.  Gargle warm saltwater or take cough drops to comfort your throat as told by your doctor.  Use a warm mist humidifier or inhale steam from a shower to increase air moisture. This may make it easier to breathe.  Drink enough fluid to keep your pee (urine) clear or pale yellow.  Eat soups and other clear broths.  Have a healthy diet.  Rest as needed.  Go back to work when your fever is gone or your doctor says it is okay. ? You may need to stay home longer to avoid giving your URI to others. ? You can also wear a face mask and wash your hands often to prevent spread of the virus.  Use your inhaler more if you have asthma.  Do not use any tobacco products, including cigarettes, chewing tobacco, or electronic cigarettes. If you need help quitting, ask your doctor. Contact a doctor if:  You are getting worse, not better.  Your symptoms are not helped by medicine.  You have chills.  You are getting more short of breath.  You have brown or red mucus.  You have yellow or brown discharge from your nose.  You have pain in your face, especially when you bend forward.  You have a fever.  You have puffy (swollen) neck glands.  You have pain while swallowing.  You have white areas in the back of your throat. Get help right away if:  You have very bad or constant: ? Headache. ? Ear pain. ? Pain in your forehead, behind your eyes, and over your cheekbones (sinus pain). ? Chest pain.  You have long-lasting (chronic) lung disease and any of the following: ? Wheezing. ? Long-lasting cough. ? Coughing up blood. ? A change in your usual mucus.  You have a stiff neck.  You have  changes in your: ? Vision. ? Hearing. ? Thinking. ? Mood. This information is not intended to replace advice given to you by your health care provider. Make sure you discuss any questions you have with your health care provider. Document Released: 03/15/2008 Document Revised: 05/30/2016 Document Reviewed: 01/02/2014 Elsevier Interactive Patient Education  2018 [employer]  

## 2017-11-14 NOTE — Progress Notes (Signed)
Patient is here for follow-up.  Patient stated she thinks she have the flu. Patient stated her body aches, fever, chills, cough, and head hurts.

## 2017-11-14 NOTE — Progress Notes (Signed)
Subjective:  Patient ID: Michelle Gibson, female    DOB: 01/18/1976  Age: 42 y.o. MRN: [id number]  CC: Generalized Body Aches   HPI Michelle Gibson presents for possible URI symptoms. Symptoms inc.body aches, fevers, chills, productive cough with clear sputum, dyspnea, and headache. She reports history of contact with someone who had flu. She was evaluated in office previous symptoms and was given course of tamiflu. She reports completing course.      Outpatient Medications Prior to Visit  Medication Sig Dispense Refill  . albuterol (PROVENTIL HFA;VENTOLIN HFA) 108 (90 Base) MCG/ACT inhaler Inhale 2 puffs into the lungs every 4 (four) hours as needed for wheezing or shortness of breath. 54 g 3  . benzonatate (TESSALON) 100 MG capsule Take 2 capsules (200 mg total) by mouth 3 (three) times daily as needed for cough. 40 capsule 0  . mometasone-formoterol (DULERA) 200-5 MCG/ACT AERO Inhale 2 puffs into the lungs 2 (two) times daily. 39 Inhaler 3  . oseltamivir (TAMIFLU) 75 MG capsule Take 1 capsule (75 mg total) by mouth 2 (two) times daily. 10 capsule 0  . predniSONE (DELTASONE) 10 MG tablet 6,5,4,3,2,1 take each days dose in am with food. 21 tablet 0  . metFORMIN (GLUCOPHAGE) 500 MG tablet Take 1 tablet (500 mg total) by mouth 2 (two) times daily with a meal. (Patient not taking: Reported on 10/09/2017) 180 tablet 1   Facility-Administered Medications Prior to Visit  Medication Dose Route Frequency Provider Last Rate Last Dose  . ipratropium-albuterol (DUONEB) 0.5-2.5 (3) MG/3ML nebulizer solution 3 mL  3 mL Nebulization Q20 Min PRN Annaliz Aven R, FNP        ROS Review of Systems  Constitutional: Positive for fatigue and fever.  HENT: Positive for congestion and rhinorrhea.   Eyes: Negative for discharge.  Respiratory: Positive for cough and shortness of breath.   Cardiovascular: Negative.   Gastrointestinal: Negative for nausea and vomiting.  Neurological: Positive for headaches.          Objective:  BP 121/80 (BP Location: Left Arm, Patient Position: Sitting, Cuff Size: Normal)   Pulse 74   Temp 98.2 F (36.8 C) (Oral)   Resp 18   Ht 5\' 6"  (1.676 m)   Wt 167 lb 12.8 oz (76.1 kg)   SpO2 95%   BMI 27.08 kg/m   BP/Weight 11/14/2017 4/85/4627 0/12/5007  Systolic BP 381 829 96  Diastolic BP 80 69 61  Wt. (Lbs) 167.8 164 164  BMI 27.08 26.47 26.47     Physical Exam  Constitutional: She appears well-developed and well-nourished.  HENT:  Head: Normocephalic and atraumatic.  Right Ear: External ear normal. Tympanic membrane is not erythematous.  Left Ear: External ear normal. Tympanic membrane is not erythematous.  Nose: Mucosal edema and rhinorrhea present.  Mouth/Throat: Oropharynx is clear and moist.  Eyes: Conjunctivae are normal. Pupils are equal, round, and reactive to light. Right eye exhibits no discharge. Left eye exhibits no discharge.  Neck: Normal range of motion. Neck supple.  Cardiovascular: Normal rate, regular rhythm, normal heart sounds and intact distal pulses.  Pulmonary/Chest: Effort normal. No respiratory distress. She has wheezes. She has rhonchi.  Abdominal: Soft. Bowel sounds are normal.  Lymphadenopathy:    She has no cervical adenopathy.  Skin: Skin is warm and dry.  Psychiatric: She has a normal mood and affect.  Nursing note and vitals reviewed.    Assessment & Plan:   1. Upper respiratory tract infection, unspecified type Neb txt x 2 given  in office with improvement. Given instruction if symptoms do not improve or worsen go the ED. - Respiratory virus panel - ipratropium-albuterol (DUONEB) 0.5-2.5 (3) MG/3ML nebulizer solution 3 mL - azithromycin (ZITHROMAX) 250 MG tablet; FIRST DAY TAKE TWO TABLETS BY MOUTH. THEN TAKE ONE TABLE BY MOUTH DAILY.  Dispense: 6 tablet; Refill: 0 - CBC - ipratropium-albuterol (DUONEB) 0.5-2.5 (3) MG/3ML SOLN; Take 3 mLs by nebulization every 6 (six) hours as needed (wheezing or shortness of  breath).  Dispense: 240 mL; Refill: 1 - guaiFENesin (MUCINEX) 600 MG 12 hr tablet; Take 2 tablets (1,200 mg total) by mouth 2 (two) times daily.  Dispense: 14 tablet; Refill: 0  2. Fever, unspecified fever cause  - CBC - DG Chest 2 View; Future - acetaminophen (TYLENOL) 325 MG tablet; Take 2 tablets (650 mg total) by mouth every 6 (six) hours as needed for fever.  3. Severe persistent asthma with exacerbation  - ipratropium-albuterol (DUONEB) 0.5-2.5 (3) MG/3ML nebulizer solution 3 mL - predniSONE (DELTASONE) 10 MG tablet; DAY 1: TAKE 6 TABLETS BY MOUTH WITH MEAL; DAY 2: TAKE 5 TABLETS; DAY 3: TAKE 4 TABLETS; DAY 4: TAKE 3 TABLETS; DAY 5: TAKE 2 TABLETS; DAY 6: TAKE 1 TABLET.  Dispense: 21 tablet; Refill: 0 - Misc. Devices (PULSE OXIMETER FOR FINGER) MISC; 1 Device by Does not apply route once for 1 dose.  Dispense: 1 each; Refill: 0  4. Depression with anxiety Counseling information resources given.   5. Shortness of breath  - ipratropium-albuterol (DUONEB) 0.5-2.5 (3) MG/3ML SOLN; Take 3 mLs by nebulization every 6 (six) hours as needed (wheezing or shortness of breath).  Dispense: 240 mL; Refill: 1  6. Prophylactic measure Patient reports yeast infection after use of antibiotics. - fluconazole (DIFLUCAN) 150 MG tablet; Take 1 tablet (150 mg total) by mouth once for 1 dose.  Dispense: 1 tablet; Refill: 0      Alfonse Spruce FNP

## 2017-11-15 ENCOUNTER — Telehealth: Payer: Self-pay | Admitting: *Deleted

## 2017-11-15 ENCOUNTER — Ambulatory Visit (HOSPITAL_COMMUNITY)
Admission: RE | Admit: 2017-11-15 | Discharge: 2017-11-15 | Disposition: A | Discharge status: Home / Self Care | Payer: Medicaid Other | Source: Ambulatory Visit | Attending: Family Medicine | Admitting: Family Medicine

## 2017-11-15 DIAGNOSIS — R918 Other nonspecific abnormal finding of lung field: Secondary | ICD-10-CM | POA: Insufficient documentation

## 2017-11-15 DIAGNOSIS — R509 Fever, unspecified: Secondary | ICD-10-CM

## 2017-11-15 MED FILL — predniSONE 10 MG TABS: 10 | 6 days supply | Qty: 21 | Fill #0

## 2017-11-15 MED FILL — FLUCONAZOLE 150 MG TABLET: 150 | 1 days supply | Qty: 1 | Fill #0

## 2017-11-15 NOTE — Telephone Encounter (Signed)
Unable to contact pt for the following results   Xray negative for pneumonia. Lungs have adequate air, no bronchospasm present. Heart size normal

## 2017-11-19 LAB — RESPIRATORY VIRUS PANEL
ADENOVIRUS: NEGATIVE
INFLUENZA A: NEGATIVE
Influenza B: NEGATIVE
METAPNEUMOVIRUS: NEGATIVE
PARAINFLUENZA 1 A: NEGATIVE
Parainfluenza 2: NEGATIVE
Parainfluenza 3: NEGATIVE
RHINOVIRUS: NEGATIVE
Respiratory Syncytial Virus A: NEGATIVE
Respiratory Syncytial Virus B: NEGATIVE

## 2017-11-25 ENCOUNTER — Telehealth (INDEPENDENT_AMBULATORY_CARE_PROVIDER_SITE_OTHER): Payer: Self-pay | Admitting: *Deleted

## 2017-11-25 NOTE — Telephone Encounter (Signed)
MA unable to reach patient due to ringing and disconnecting. Please inform patient of RSV being negative and needing to take prescribed medications, get plenty of fluids and rest.

## 2017-11-25 NOTE — Telephone Encounter (Signed)
-----   Message from Alfonse Spruce, [place] sent at 11/21/2017  8:36 AM EST ----- RSV was negative for some of the most common respiratory virus including influenza. Continue to take medications as prescribed, increase fluid intake, rest, and handwashing to prevent spread.

## 2017-12-06 ENCOUNTER — Encounter (HOSPITAL_COMMUNITY): Payer: Self-pay

## 2017-12-06 ENCOUNTER — Other Ambulatory Visit: Payer: Self-pay

## 2017-12-06 ENCOUNTER — Emergency Department (HOSPITAL_COMMUNITY)
Admission: EM | Admit: 2017-12-06 | Discharge: 2017-12-06 | Disposition: A | Discharge status: Home / Self Care | Payer: Medicaid Other | Attending: Emergency Medicine | Admitting: Emergency Medicine

## 2017-12-06 DIAGNOSIS — J45909 Unspecified asthma, uncomplicated: Secondary | ICD-10-CM | POA: Insufficient documentation

## 2017-12-06 DIAGNOSIS — Z79899 Other long term (current) drug therapy: Secondary | ICD-10-CM | POA: Insufficient documentation

## 2017-12-06 DIAGNOSIS — R079 Chest pain, unspecified: Secondary | ICD-10-CM | POA: Insufficient documentation

## 2017-12-06 DIAGNOSIS — R55 Syncope and collapse: Secondary | ICD-10-CM | POA: Insufficient documentation

## 2017-12-06 DIAGNOSIS — I456 Pre-excitation syndrome: Secondary | ICD-10-CM | POA: Insufficient documentation

## 2017-12-06 LAB — BASIC METABOLIC PANEL
Anion gap: 12 (ref 5–15)
BUN: 21 mg/dL — AB (ref 6–20)
CO2: 22 mmol/L (ref 22–32)
CREATININE: 0.84 mg/dL (ref 0.44–1.00)
Calcium: 9.2 mg/dL (ref 8.9–10.3)
Chloride: 106 mmol/L (ref 101–111)
GFR calc Af Amer: >60 mL/min (ref >60)
Glucose, Bld: 109 mg/dL — ABNORMAL HIGH (ref 65–99)
Potassium: 4.3 mmol/L (ref 3.5–5.1)
SODIUM: 140 mmol/L (ref 135–145)

## 2017-12-06 LAB — CBC WITH DIFFERENTIAL/PLATELET
BASOS ABS: 0 10*3/uL (ref 0.0–0.1)
Basophils Relative: 1 %
EOS ABS: 0.4 10*3/uL (ref 0.0–0.7)
EOS PCT: 6 %
HCT: 43.7 % (ref 36.0–46.0)
Hemoglobin: 14.3 g/dL (ref 12.0–15.0)
Lymphocytes Relative: 36 %
Lymphs Abs: 2.3 10*3/uL (ref 0.7–4.0)
MCH: 29.9 pg (ref 26.0–34.0)
MCHC: 32.7 g/dL (ref 30.0–36.0)
MCV: 91.4 fL (ref 78.0–100.0)
MONO ABS: 0.3 10*3/uL (ref 0.1–1.0)
Monocytes Relative: 4 %
Neutro Abs: 3.5 10*3/uL (ref 1.7–7.7)
Neutrophils Relative %: 53 %
PLATELETS: 245 10*3/uL (ref 150–400)
RBC: 4.78 MIL/uL (ref 3.87–5.11)
RDW: 13.3 % (ref 11.5–15.5)
WBC: 6.5 10*3/uL (ref 4.0–10.5)

## 2017-12-06 LAB — I-STAT TROPONIN, ED: TROPONIN I, POC: 0 ng/mL (ref 0.00–0.08)

## 2017-12-06 NOTE — Discharge Instructions (Signed)
Ms. Sramek,   Your labs and vital signs were reassuring. I think it is unlikely these episodes are occurring due to a problem with your heart.   I believe it will benefit you to see a Neurologist. I understand you are limited by your insurance. Your primary care provider at [facility] can help facilitate an appointment with the Neurologist. Please follow up with your PCP within 2-3 days.   If your episodes become more frequent, more severe, or change please be re-evaluated in the emergency department or urgent care center.

## 2017-12-06 NOTE — ED Provider Notes (Signed)
[facility] EMERGENCY DEPARTMENT Provider Note   CSN: [id number] Arrival date & time: 12/06/17  0807     History   Chief Complaint Chief Complaint  Patient presents with  . Palpitations  . Near Syncope    HPI Michelle Gibson is a 42 y.o. female with PMH of asthma and WPW presenting with dizziness and syncopal episodes.   Patient states she has "dizzy spells" and they have been occuring since October. She states the spells are getting more frequent and are more severe. These episodes are now occurring weekly. She has two episodes this past week. She states that she will become dizzy, feels her rate racing/palpitations, and will then black out. She states no one has ever told her she has convulsions when she passes out. She denies bladder or bowel incontinence or sore tongue after these events. She states she feels disoriented for about 30 minutes to one hour after these episodes. Her episodes are not exacerbated by positional head movement, or standing from a seated position, but the room does spin and she feels very off balance. The spinning will last from 0-5 minutes. She cannot identify any aggravating or alleviating factors. After her episodes she will have migraine like headaches that will last for days unrelieved by OTC pain medication. The episode she had yesterday was associated with cramping chest pain that radiates to her jaw, no CP today. Denies changes in medications, only takes dulera and albuterol. Denies ETOH or illicit drug use.   The patient has a history of WPW and has had extensive cardiac work up at [facility] for syncopal episodes and CP. She was seen in the ED in 09/2017 for 2 syncopal episodes and associated CP. Her telemetry was normal x 2 day and transthoracic echocardiogram had normal LV systolic function, EF 40%. She was discharged with a 30 day holter monitor and provided a prescription for paroxetine as it was felt her CP was due to significant anxiety.  Holter monitor was worn from 10/13/2017 - 11/11/2017, which recorded 0critical, 0 serious, and 2 stable events that occurred. Of the two manual activations, one was baseline (sinus), and a second (no symptoms specified) showed sinus rhythm with a PVC. Father had MI and CABG when he was 82, mother also had MI.         Past Medical History:  Diagnosis Date  . Asthma   . WPW (Wolff-Parkinson-White syndrome)     Patient Active Problem List   Diagnosis Date Noted  . Asthma with acute exacerbation 09/15/2016  . Hyperglycemia 09/15/2016  . History of Wolff-Parkinson-White (WPW) syndrome 09/15/2016  . Acute respiratory failure with hypoxia ([place]) 09/15/2016  . Bronchospasm 09/11/2016    Past Surgical History:  Procedure Laterality Date  . ABDOMINAL HYSTERECTOMY    . CARDIAC SURGERY    . CESAREAN SECTION      OB History    No data available       Home Medications    Prior to Admission medications   Medication Sig Start Date End Date Taking? Authorizing Provider  acetaminophen (TYLENOL) 325 MG tablet Take 2 tablets (650 mg total) by mouth every 6 (six) hours as needed for fever. 11/14/17  Yes Hairston, Maylon Peppers, FNP  albuterol (PROVENTIL HFA;VENTOLIN HFA) 108 (90 Base) MCG/ACT inhaler Inhale 2 puffs into the lungs every 4 (four) hours as needed for wheezing or shortness of breath. 10/17/17  Yes Hairston, Maylon Peppers, FNP  famotidine (PEPCID) 20 MG tablet Take 20 mg by mouth 2 (  two) times daily.   Yes [provider]  ibuprofen (ADVIL,MOTRIN) 200 MG tablet Take 200 mg by mouth every 6 (six) hours as needed for moderate pain.   Yes [provider]  ipratropium-albuterol (DUONEB) 0.5-2.5 (3) MG/3ML SOLN Take 3 mLs by nebulization every 6 (six) hours as needed (wheezing or shortness of breath). 11/14/17  Yes Hairston, Mandesia R, FNP  mometasone-formoterol (DULERA) 200-5 MCG/ACT AERO Inhale 2 puffs into the lungs 2 (two) times daily. 10/17/17  Yes Alfonse Spruce, FNP      Family History History reviewed. No pertinent family history.  Social History Social History   Tobacco Use  . Smoking status: Never Smoker  . Smokeless tobacco: Never Used  Substance Use Topics  . Alcohol use: No  . Drug use: No     Allergies   Ciprofloxacin; Flecainide; and Penicillin g   Review of Systems Review of Systems  HENT: Negative.   Respiratory: Positive for shortness of breath.   Cardiovascular: Positive for chest pain and palpitations. Negative for leg swelling.  Gastrointestinal: Negative for diarrhea, nausea and vomiting.  Genitourinary: Negative.   Neurological: Positive for dizziness, syncope and speech difficulty. Negative for facial asymmetry and weakness.  Psychiatric/Behavioral: The patient is nervous/anxious.      Physical Exam Updated Vital Signs BP 128/89   Pulse 64   Temp 98.1 F (36.7 C) (Oral)   Resp 15   SpO2 99%   Physical Exam  Constitutional: She is oriented to person, place, and time. She appears well-developed and well-nourished. No distress.  HENT:  Head: Normocephalic and atraumatic.  Mouth/Throat: Oropharynx is clear and moist.  Eyes: Conjunctivae are normal. Pupils are equal, round, and reactive to light.  Cardiovascular: Normal rate, regular rhythm, normal heart sounds and intact distal pulses.  Pulmonary/Chest: Effort normal and breath sounds normal. She has no wheezes.  Abdominal: Soft. Bowel sounds are normal. There is no tenderness.  Neurological: She is alert and oriented to person, place, and time. She has normal strength. No cranial nerve deficit or sensory deficit.  Skin: Skin is warm and dry.  Psychiatric: She has a normal mood and affect.     ED Treatments / Results  Labs (all labs ordered are listed, but only abnormal results are displayed) Labs Reviewed  BASIC METABOLIC PANEL - Abnormal; Notable for the following components:      Result Value   Glucose, Bld 109 (*)    BUN 21 (*)    All other  components within normal limits  CBC WITH DIFFERENTIAL/PLATELET  I-STAT TROPONIN, ED    EKG  EKG Interpretation  Date/Time:  Tuesday December 06 2017 08:14:59 EST Ventricular Rate:  68 PR Interval:    QRS Duration: 94 QT Interval:  424 QTC Calculation: 451 R Axis:   50 Text Interpretation:  Sinus rhythm Short PR interval T wave abnormality Artifact Abnormal ekg Confirmed by Carmin Muskrat [phone]) on 12/06/2017 8:19:40 AM       Radiology No results found.  Procedures Procedures (including critical care time)  Medications Ordered in ED Medications - No data to display   Initial Impression / Assessment and Plan / ED Course  I have reviewed the triage vital signs and the nursing notes.  Pertinent labs & imaging results that were available during my care of the patient were reviewed by me and considered in my medical decision making (see chart for details).   42 yo female PMH of WPW and asthma presenting with recurrent syncopal episodes and CP.  Vital signs stable. EKG unchanged from prior. I-stat troponin negative, heart score of 1 due to significant family history. CBC and BMET are without significant abnormality. No focal neurological deficits on examination. Orthostatic vital signs without changes.   She has had cardiac evaluation at [facility], including 30 day Holter monitor and TTE, which were both unremarkable. Unlikely this is cardiogenic syncope. No seizure history or seizure like activity associated with her syncopal episodes.  She describes migraines associated with these events. It is possible her syncope is related to atypical migraines or is experiencing vertigo. Patient will benefit from outpatient Neurology evaluation to discuss her symptoms, migraines and recurrent syncopal episdoes. Patient only has insurance for family planning. Discussed with patient that her primary care provider at [facility] should be able to help facilitate Neurology follow up.   Return precautions were discussed with the patient.    Final Clinical Impressions(s) / ED Diagnoses   Final diagnoses:  Near syncope    ED Discharge Orders    None       Melanee Spry, MD 12/06/17 1059    Carmin Muskrat, MD 12/07/17 1005

## 2017-12-06 NOTE — ED Triage Notes (Signed)
[facility]- pt has been having palpitations with increasing dizziness with standing X2 days. Pt had an episode where she passed out. Pt had an episode of dizziness with standing, NO LOC. No orthostatic changes with EMS. Hx of WPW. HR 90, 99% RA, 110/72, 18rr, 122cbg

## 2017-12-07 ENCOUNTER — Ambulatory Visit: Payer: Self-pay | Attending: Internal Medicine | Admitting: Physician Assistant

## 2017-12-07 ENCOUNTER — Encounter: Payer: Self-pay | Admitting: General Practice

## 2017-12-07 VITALS — BP 113/79 | HR 85 | Temp 96.5°F | Ht 66.0 in | Wt 170.6 lb

## 2017-12-07 DIAGNOSIS — I456 Pre-excitation syndrome: Secondary | ICD-10-CM | POA: Insufficient documentation

## 2017-12-07 DIAGNOSIS — J45909 Unspecified asthma, uncomplicated: Secondary | ICD-10-CM | POA: Insufficient documentation

## 2017-12-07 DIAGNOSIS — R55 Syncope and collapse: Secondary | ICD-10-CM | POA: Insufficient documentation

## 2017-12-07 DIAGNOSIS — Z7951 Long term (current) use of inhaled steroids: Secondary | ICD-10-CM | POA: Insufficient documentation

## 2017-12-07 DIAGNOSIS — Z79899 Other long term (current) drug therapy: Secondary | ICD-10-CM | POA: Insufficient documentation

## 2017-12-07 DIAGNOSIS — R42 Dizziness and giddiness: Secondary | ICD-10-CM | POA: Insufficient documentation

## 2017-12-07 DIAGNOSIS — F418 Other specified anxiety disorders: Secondary | ICD-10-CM | POA: Insufficient documentation

## 2017-12-07 DIAGNOSIS — R51 Headache: Secondary | ICD-10-CM | POA: Insufficient documentation

## 2017-12-07 MED ORDER — MECLIZINE HCL 25 MG PO TABS: 25.0000 mg | ORAL_TABLET | Freq: Three times a day (TID) | ORAL | 1 refills | Status: DC | PRN

## 2017-12-07 MED FILL — ?MECLIZINE 25MG TAB: 25 | 10 days supply | Qty: 30 | Fill #0

## 2017-12-07 NOTE — Progress Notes (Signed)
Chief Complaint: ED follow up  Subjective: This is a 42 year old female with a history of asthma, depression mixed with anxiety, WPW status post remote ablation and multiple episodes of syncope/presyncope. She has had an extensive cardiac workup including labs, EKG, Holter monitoring for 30 days and echocardiography which is all been nonrevealing. This was completed at [facility].  She most recently went to the hospital on yesterday for emergency Department evaluation. She's been having increasing more frequent episodes of dizziness. She also feels like her heart is racing at times and she gets fluttering in her chest. After the episode she is disoriented for short period of time and develops a headache.   ROS:  GEN: denies fever or chills, denies change in weight Skin: denies lesions or rashes HEENT: + headache, earache, epistaxis, sore throat, or neck pain +dizzy LUNGS: denies SHOB, dyspnea, PND, orthopnea NEURO: denies numbness or tingling, denies sz, stroke or TIA   Objective:  Vitals:   12/07/17 1413  BP: 113/79  Pulse: 85  Temp: (!) 96.5 F (35.8 C)  TempSrc: Oral  SpO2: 100%  Weight: 170 lb 9.6 oz (77.4 kg)  Height: 5\' 6"  (1.676 m)    Physical Exam:  General: in no acute distress. HEENT: no pallor, no icterus, moist oral mucosa, no JVD, no lymphadenopathy Extremities: No clubbing cyanosis or edema with positive pedal pulses. Neuro: Alert, awake, oriented x3, nonfocal.  Pertinent Lab Results:none   Medications: Prior to Admission medications   Medication Sig Start Date End Date Taking? Authorizing Provider  albuterol (PROVENTIL HFA;VENTOLIN HFA) 108 (90 Base) MCG/ACT inhaler Inhale 2 puffs into the lungs every 4 (four) hours as needed for wheezing or shortness of breath. 10/17/17  Yes Hairston, Maylon Peppers, FNP  famotidine (PEPCID) 20 MG tablet Take 20 mg by mouth 2 (two) times daily.   Yes [provider]  ibuprofen (ADVIL,MOTRIN) 200 MG tablet Take 200  mg by mouth every 6 (six) hours as needed for moderate pain.   Yes [provider]  ipratropium-albuterol (DUONEB) 0.5-2.5 (3) MG/3ML SOLN Take 3 mLs by nebulization every 6 (six) hours as needed (wheezing or shortness of breath). 11/14/17  Yes Hairston, Mandesia R, FNP  mometasone-formoterol (DULERA) 200-5 MCG/ACT AERO Inhale 2 puffs into the lungs 2 (two) times daily. 10/17/17  Yes Alfonse Spruce, FNP  acetaminophen (TYLENOL) 325 MG tablet Take 2 tablets (650 mg total) by mouth every 6 (six) hours as needed for fever. Patient not taking: Reported on 12/07/2017 11/14/17   Alfonse Spruce, FNP  meclizine (ANTIVERT) 25 MG tablet Take 1 tablet (25 mg total) by mouth 3 (three) times daily as needed. 12/07/17   Brayton Caves, PA-C    Assessment: 1. Dizziness/Presyncope 2. Headaches   Plan: Had a negative cardiac workup including Holter monitoring, EKG, labs and echocardiography. We'll refer her to neurology given her complaints and headaches. We'll try a trial of meclizine. Four-week follow-up here.   The patient was given clear instructions to go to ER or return to medical center if symptoms don't improve, worsen or new problems develop. The patient verbalized understanding. The patient was told to call to get lab results if they haven't heard anything in the next week.   This note has been created with Surveyor, quantity. Any transcriptional errors are unintentional.   Zettie Pho, PA-C 12/07/2017, 2:37 PM

## 2018-01-04 ENCOUNTER — Ambulatory Visit: Payer: Self-pay | Admitting: Nurse Practitioner

## 2018-02-24 ENCOUNTER — Encounter

## 2018-05-12 ENCOUNTER — Emergency Department (HOSPITAL_COMMUNITY)
Admission: EM | Admit: 2018-05-12 | Discharge: 2018-05-12 | Disposition: A | Discharge status: Home / Self Care | Payer: Self-pay | Attending: Emergency Medicine | Admitting: Emergency Medicine

## 2018-05-12 ENCOUNTER — Encounter (HOSPITAL_COMMUNITY): Payer: Self-pay

## 2018-05-12 ENCOUNTER — Emergency Department (HOSPITAL_COMMUNITY): Payer: Self-pay

## 2018-05-12 ENCOUNTER — Other Ambulatory Visit: Payer: Self-pay

## 2018-05-12 DIAGNOSIS — Y999 Unspecified external cause status: Secondary | ICD-10-CM | POA: Insufficient documentation

## 2018-05-12 DIAGNOSIS — J45909 Unspecified asthma, uncomplicated: Secondary | ICD-10-CM | POA: Insufficient documentation

## 2018-05-12 DIAGNOSIS — Y9301 Activity, walking, marching and hiking: Secondary | ICD-10-CM | POA: Insufficient documentation

## 2018-05-12 DIAGNOSIS — Y929 Unspecified place or not applicable: Secondary | ICD-10-CM | POA: Insufficient documentation

## 2018-05-12 DIAGNOSIS — Z79899 Other long term (current) drug therapy: Secondary | ICD-10-CM | POA: Insufficient documentation

## 2018-05-12 DIAGNOSIS — X509XXA Other and unspecified overexertion or strenuous movements or postures, initial encounter: Secondary | ICD-10-CM | POA: Insufficient documentation

## 2018-05-12 DIAGNOSIS — S8991XA Unspecified injury of right lower leg, initial encounter: Secondary | ICD-10-CM | POA: Insufficient documentation

## 2018-05-12 MED ORDER — IBUPROFEN 200 MG PO TABS
600.0000 mg | ORAL_TABLET | Freq: Once | ORAL | Status: AC
  Administered 2018-05-12: 600 mg via ORAL
  Filled 2018-05-12: qty 3

## 2018-05-12 MED ORDER — OXYCODONE-ACETAMINOPHEN 5-325 MG PO TABS: 1.0000 | ORAL_TABLET | Freq: Four times a day (QID) | ORAL | 0 refills | Status: DC | PRN

## 2018-05-12 MED ORDER — METHOCARBAMOL 500 MG PO TABS: 500.0000 mg | ORAL_TABLET | Freq: Two times a day (BID) | ORAL | 0 refills | Status: DC

## 2018-05-12 MED ORDER — OXYCODONE-ACETAMINOPHEN 5-325 MG PO TABS
2.0000 | ORAL_TABLET | Freq: Once | ORAL | Status: AC
  Administered 2018-05-12: 2 via ORAL
  Filled 2018-05-12: qty 2

## 2018-05-12 NOTE — ED Provider Notes (Signed)
[facility] DEPT Provider Note   CSN: [id number] Arrival date & time: 05/12/18  1706     History   Chief Complaint Chief Complaint  Patient presents with  . Knee Pain    HPI Michelle Gibson is a 42 y.o. female.  HPI   Michelle Gibson is a 42 y.o. female, with a history of asthma and WPW, presenting to the ED with a right knee injury that occurred this afternoon.  Slipped on a puddle of water, right leg twisted at the knee and had valgus deviation.  States her knee now "feels loose."  Pain is severe, throbbing, radiating superiorly and inferiorly.  Denies head injury, neck/back pain, hip pain, numbness, weakness, or any other complaints.     Past Medical History:  Diagnosis Date  . Asthma   . WPW (Wolff-Parkinson-White syndrome)     Patient Active Problem List   Diagnosis Date Noted  . Asthma with acute exacerbation 09/15/2016  . Hyperglycemia 09/15/2016  . History of Wolff-Parkinson-White (WPW) syndrome 09/15/2016  . Acute respiratory failure with hypoxia ([place]) 09/15/2016  . Bronchospasm 09/11/2016    Past Surgical History:  Procedure Laterality Date  . ABDOMINAL HYSTERECTOMY    . CARDIAC SURGERY    . CESAREAN SECTION       OB History   None      Home Medications    Prior to Admission medications   Medication Sig Start Date End Date Taking? Authorizing Provider  acetaminophen (TYLENOL) 325 MG tablet Take 2 tablets (650 mg total) by mouth every 6 (six) hours as needed for fever. Patient not taking: Reported on 12/07/2017 11/14/17   Alfonse Spruce, FNP  albuterol (PROVENTIL HFA;VENTOLIN HFA) 108 (90 Base) MCG/ACT inhaler Inhale 2 puffs into the lungs every 4 (four) hours as needed for wheezing or shortness of breath. 10/17/17   Alfonse Spruce, FNP  famotidine (PEPCID) 20 MG tablet Take 20 mg by mouth 2 (two) times daily.    [provider]  ibuprofen (ADVIL,MOTRIN) 200 MG tablet Take 200 mg by mouth every 6 (six) hours  as needed for moderate pain.    [provider]  ipratropium-albuterol (DUONEB) 0.5-2.5 (3) MG/3ML SOLN Take 3 mLs by nebulization every 6 (six) hours as needed (wheezing or shortness of breath). 11/14/17   Alfonse Spruce, FNP  meclizine (ANTIVERT) 25 MG tablet Take 1 tablet (25 mg total) by mouth 3 (three) times daily as needed. 12/07/17   Brayton Caves, PA-C  methocarbamol (ROBAXIN) 500 MG tablet Take 1 tablet (500 mg total) by mouth 2 (two) times daily. 05/12/18   Odelle Kosier C, PA-C  mometasone-formoterol (DULERA) 200-5 MCG/ACT AERO Inhale 2 puffs into the lungs 2 (two) times daily. 10/17/17   Alfonse Spruce, FNP  oxyCODONE-acetaminophen (PERCOCET/ROXICET) 5-325 MG tablet Take 1-2 tablets by mouth every 6 (six) hours as needed for severe pain. 05/12/18   Nylia Gavina, Helane Gunther, PA-C    Family History No family history on file.  Social History Social History   Tobacco Use  . Smoking status: Never Smoker  . Smokeless tobacco: Never Used  Substance Use Topics  . Alcohol use: No  . Drug use: No     Allergies   Ciprofloxacin; Flecainide; and Penicillin g   Review of Systems Review of Systems  Gastrointestinal: Negative for nausea and vomiting.  Musculoskeletal: Positive for arthralgias.  Neurological: Negative for weakness and numbness.     Physical Exam Updated Vital Signs BP 135/87   Pulse  93   Temp 98.8 F (37.1 C) (Oral)   Resp 15   Ht 5\' 6"  (1.676 m)   Wt 81.6 kg (180 lb)   SpO2 100%   BMI 29.05 kg/m   Physical Exam  Constitutional: She appears well-developed and well-nourished. No distress.  HENT:  Head: Normocephalic and atraumatic.  Eyes: Conjunctivae are normal.  Neck: Neck supple.  Cardiovascular: Normal rate, regular rhythm and intact distal pulses.  Pulses:      Dorsalis pedis pulses are 2+ on the right side.       Posterior tibial pulses are 2+ on the right side.  Pulmonary/Chest: Effort normal.  Musculoskeletal:  Tenderness throughout the  right knee without noted deformity or swelling.  Laxity in the knee especially with valgus stress. Range of motion in the right hip without noted pain, as long as the right knee is supported during the movements. No tenderness, pain, deformity, or laxity in the right ankle.  Normal motor function intact in all other extremities and spine. No midline spinal tenderness.   Neurological: She is alert.  Sensation to light touch grossly intact in the bilateral lower extremities. Motor function intact in the bilateral lower extremities.  Skin: Skin is warm and dry. She is not diaphoretic. No pallor.  Psychiatric: She has a normal mood and affect. Her behavior is normal.  Nursing note and vitals reviewed.    ED Treatments / Results  Labs (all labs ordered are listed, but only abnormal results are displayed) Labs Reviewed - No data to display  EKG None  Radiology Dg Knee Complete 4 Views Right  Result Date: 05/12/2018 CLINICAL DATA:  Fall, knee pain EXAM: RIGHT KNEE - COMPLETE 4+ VIEW COMPARISON:  None. FINDINGS: No acute displaced fracture or malalignment. Small knee effusion. Joint spaces are maintained. IMPRESSION: No acute osseous abnormality.  Small knee effusion. Electronically Signed   By: Donavan Foil M.D.   On: 05/12/2018 19:11    Procedures Procedures (including critical care time)  Medications Ordered in ED Medications  oxyCODONE-acetaminophen (PERCOCET/ROXICET) 5-325 MG per tablet 2 tablet (2 tablets Oral Given 05/12/18 2013)  ibuprofen (ADVIL,MOTRIN) tablet 600 mg (600 mg Oral Given 05/12/18 2013)     Initial Impression / Assessment and Plan / ED Course  I have reviewed the triage vital signs and the nursing notes.  Pertinent labs & imaging results that were available during my care of the patient were reviewed by me and considered in my medical decision making (see chart for details).     Patient presents with a right knee injury.  Laxity on exam, giving suspicion for  ligamental injury.  Small effusion, but no other acute on normality on x-ray.  Knee immobilizer, crutches, pain management, and orthopedic follow-up. The patient was given instructions for home care as well as return precautions. Patient voices understanding of these instructions, accepts the plan, and is comfortable with discharge.  Search of the narcotic database shows no narcotic prescriptions since September 2018.  Final Clinical Impressions(s) / ED Diagnoses   Final diagnoses:  Injury of right knee, initial encounter    ED Discharge Orders        Ordered    oxyCODONE-acetaminophen (PERCOCET/ROXICET) 5-325 MG tablet  Every 6 hours PRN     05/12/18 2009    methocarbamol (ROBAXIN) 500 MG tablet  2 times daily     05/12/18 2009       Layla Maw 05/12/18 2043    Dorie Rank, MD 05/12/18 2344

## 2018-05-12 NOTE — ED Triage Notes (Addendum)
Pt BIBA from work. She fell in a puddle of water in a closet, injuring her right knee. No obvious deformity noted. Pt has FROM, sensory. Pt described it as "2 bones pulling apart". Pt states that her kneecap raised up. VSS.

## 2018-05-12 NOTE — Discharge Instructions (Addendum)
You have been seen today for a knee injury. There were no acute abnormalities on the x-rays, including no sign of fracture or dislocation, however, there could be injuries to the soft tissues, such as the ligaments or tendons that are not seen on xrays. There could also be what are called occult fractures that are small fractures not seen on xray. Pain: Take 600 mg of ibuprofen every 6 hours or 440 mg (over the counter dose) to 500 mg (prescription dose) of naproxen every 12 hours for the next 3 days. After this time, these medications may be used as needed for pain. Take these medications with food to avoid upset stomach. Choose only one of these medications, do not take them together.  Tylenol: Should you continue to have additional pain while taking the ibuprofen or naproxen, you may add in tylenol as needed. Your daily total maximum amount of tylenol from all sources should be limited to 4000mg /day for persons without liver problems, or 2000mg /day for those with liver problems. Vicodin: May take Percocet as needed for severe pain.  Do not drive or perform other dangerous activities while taking the Percocet.  Please note that each pill of Percocet contains 325 mg of Tylenol and the above dosage limits apply. Ice: May apply ice to the area over the next 24 hours for 15 minutes at a time to reduce swelling. Elevation: Keep the extremity elevated as often as possible to reduce pain and inflammation. Support: Wear the knee immobilizer for support and comfort. Wear this until pain resolves or until told otherwise by the orthopedic specialist.  You will likely be nonweightbearing until evaluated by the orthopedist. Follow up: Follow-up with orthopedic specialist on this matter.  Call the number provided to set up an appointment. Return: Return to the ED for numbness, weakness, increasing pain, overall worsening symptoms, loss of function, or if symptoms are not improving, you have tried to follow up with the  orthopedic specialist, and have been unable to do so.

## 2018-05-15 ENCOUNTER — Telehealth: Payer: Self-pay | Admitting: General Practice

## 2018-05-15 NOTE — Telephone Encounter (Signed)
Pt called to request a letter for work, she states she requires this letter in order for her to get her next paycheck, her work needs it to specify that she cant work until she sees a specialist due to her most recent ED visit. Please follow up

## 2018-05-16 ENCOUNTER — Ambulatory Visit: Payer: Self-pay | Attending: Family Medicine | Admitting: Family Medicine

## 2018-05-16 ENCOUNTER — Encounter: Payer: Self-pay | Admitting: Family Medicine

## 2018-05-16 VITALS — BP 123/78 | HR 77 | Temp 98.2°F | Resp 18 | Ht 66.0 in | Wt 180.0 lb

## 2018-05-16 DIAGNOSIS — W19XXXA Unspecified fall, initial encounter: Secondary | ICD-10-CM | POA: Insufficient documentation

## 2018-05-16 DIAGNOSIS — I456 Pre-excitation syndrome: Secondary | ICD-10-CM | POA: Insufficient documentation

## 2018-05-16 DIAGNOSIS — M545 Low back pain, unspecified: Secondary | ICD-10-CM

## 2018-05-16 DIAGNOSIS — J45909 Unspecified asthma, uncomplicated: Secondary | ICD-10-CM | POA: Insufficient documentation

## 2018-05-16 DIAGNOSIS — Z9181 History of falling: Secondary | ICD-10-CM

## 2018-05-16 DIAGNOSIS — Z881 Allergy status to other antibiotic agents status: Secondary | ICD-10-CM | POA: Insufficient documentation

## 2018-05-16 DIAGNOSIS — Z880 Allergy status to penicillin: Secondary | ICD-10-CM | POA: Insufficient documentation

## 2018-05-16 DIAGNOSIS — M25561 Pain in right knee: Secondary | ICD-10-CM

## 2018-05-16 MED ORDER — IBUPROFEN 800 MG PO TABS: 800.0000 mg | ORAL_TABLET | Freq: Three times a day (TID) | ORAL | 0 refills | Status: DC | PRN

## 2018-05-16 NOTE — Telephone Encounter (Signed)
Pt aware that an OV was available today as a walk-in. She is aware that she would worked in the appointment schedule. Pt verbalized understanding and stated she would try to get here today.

## 2018-05-16 NOTE — Progress Notes (Signed)
Subjective:    Patient ID: Michelle Gibson, female    DOB: [birth year], 42 y.o.   MRN: [id number]  HPI 42 yo female being seen for right knee pain after a fall on 05/12/18 for which she was also seen and evaluated in the ED. Patient reports that while at work she was walking across a concrete tiled floor when her foot slipped on something wet on the floor causing her to nearly do a split and her right foot hit the edge of the partially opened door and she fell over to her side/back on the floor and she saw that her right kneecap was displaced but this "popped back into place" when she rolled over. Patient was unable to move/stand due to pain and EMS was called for evaluation and transport to the ED. Patient states that she was seen and treated in the ED and ED recommended follow-up with Orthopedics and she contacted her employer who had her contact worker's compensation. Patient states that she was told that it would be 3-5 days before she could be seen and she was not written out of work that long by the ED provider. She continues to have dull, aching pain in her right knee that can be sharp with movement of her right leg. She is wearing the straight leg brace provided at the ED and uses crutches as she cannot bear weight on her right leg due to her knee pain. She is icing her knee but has used heat as well. She is taking the narcotic medication prescribed at the ED about every 8 hours for more severe pain and otherwise is taking over the counter ibuprofen. Pain ranges from a 5 after medication up to 7-8 when the medication wears off. Patient also reports that her lower back has been hurting since her fall and she did have bruising in this area.  Past Medical History:  Diagnosis Date  . Asthma   . WPW (Wolff-Parkinson-White syndrome)    Social History   Tobacco Use  . Smoking status: Never Smoker  . Smokeless tobacco: Never Used  Substance Use Topics  . Alcohol use: No  . Drug use: No   Allergies    Allergen Reactions  . Ciprofloxacin Hives  . Flecainide Hives, Nausea And Vomiting and Rash  . Penicillin G Rash    Has patient had a PCN reaction causing immediate rash, facial/tongue/throat swelling, SOB or lightheadedness with hypotension: yes Has patient had a PCN reaction causing severe rash involving mucus membranes or skin necrosis: yes Has patient had a PCN reaction that required hospitalization: no Has patient had a PCN reaction occurring within the last 10 years: no If all of the above answers are "NO", then may proceed with Cephalosporin use.       Review of Systems  Constitutional: Negative for activity change and fatigue.  Respiratory: Negative for cough and shortness of breath.   Cardiovascular: Negative for chest pain.  Gastrointestinal: Negative for abdominal pain.  Musculoskeletal: Positive for back pain, gait problem and joint swelling.       Low back pain, right knee pain and swelling in right knee s/p fall on 05/12/18       Objective:   Physical Exam  Constitutional: She appears well-developed and well-nourished.  Patient is sitting in a wheelchair and is wearing a soft straight leg brace to the right lower extremity from lower thigh to shin area (also has crutches being carried by person who is accompanying her to today's visit  Cardiovascular: Normal rate, regular rhythm and intact distal pulses.  Pulmonary/Chest: Effort normal and breath sounds normal.  Abdominal: Soft. There is no tenderness.  Musculoskeletal: She exhibits edema (mild generalized edema to the right knee ) and tenderness (generalized tenderness to the right knee and  lower back with moderate lumbar paraspinous spasm).  Skin:  Light green tinted bruising/discoloration to areas surrounding the right patella/central knee; mild increase in warmth to skin but patient had been wearing a knee brace before exam  BP 123/78 (BP Location: Left Arm, Patient Position: Sitting, Cuff Size: Normal)   Pulse  77   Temp 98.2 F (36.8 C) (Oral)   Resp 18   Ht 5\' 6"  (1.676 m)   Wt 180 lb (81.6 kg)   SpO2 95%   BMI 29.05 kg/m    Vital signs reviewed    Assessment & Plan:  1. Acute pain of right knee ED note from 05/12/18 reviewed and x-ray showed no acute fracture but X-ray alone cannot determine the extent of injury and patient may need MRI as well if her pain continues and continued issues with ambulation. Orthopedic referral or follow-up with Orthopedic specialist through employer. Remain non to limited weight bearing with use of crutches and continue icing knee 2-3 times per day as needed for swelling but do not apply ice directly to exposed skin. New Rx for Ibuprofen 800 mg to take as needed for pain but do not take on any empty stomach. Continue use of narcotic medication prescribed at the ED as needed but take precautions to prevent constipation as discussed. Follow-up here as needed regarding knee pain.  - Ambulatory referral to Orthopedic Surgery  2. Status post fall ED notes and imaging reviewed and orthopedic referral placed - Ambulatory referral to Orthopedic Surgery  3. Acute bilateral low back pain without sciatica May use ice to effected area as discussed and take the prescribed ibuprofen. Follow-up with Orthopedics if continued back pain.  An After Visit Summary was printed and given to the patient.  Return if symptoms worsen or fail to improve, for right knee pain. - Ambulatory referral to Orthopedic Surgery

## 2018-05-16 NOTE — Patient Instructions (Signed)
Acute Pain, Adult  Acute pain is a type of pain that may last for just a few days or as long as six months. It is often related to an illness, injury, or medical procedure. Acute pain may be mild, moderate, or severe. It usually goes away once your injury has healed or you are no longer ill.  Pain can make it hard for you to do daily activities. It can cause anxiety and lead to other problems if left untreated. Treatment depends on the cause and severity of your acute pain.  Follow these instructions at home:   Check your pain level as told by your health care provider.   Take over-the-counter and prescription medicines only as told by your health care provider.   If you are taking prescription pain medicine:  ? Ask your health care provider about taking a stool softener or laxative to prevent constipation.  ? Do not stop taking the medicine suddenly. Talk to your health care provider about how and when to discontinue prescription pain medicine.  ? If your pain is severe, do not take more pills than instructed by your health care provider.  ? Do not take other over-the-counter pain medicines in addition to this medicine unless told by your health care provider.  ? Do not drive or operate heavy machinery while taking prescription pain medicine.   Apply ice or heat as told by your health care provider. These may reduce swelling and pain.   Ask your health care provider if other strategies such as distraction, relaxation, or physical therapies can help your pain.   Keep all follow-up visits as told by your health care provider. This is important.  Contact a health care provider if:   You have pain that is not controlled by medicine.   Your pain does not improve or gets worse.   You have side effects from pain medicines, such as vomitingor confusion.  Get help right away if:   You have severe pain.   You have trouble breathing.   You lose consciousness.   You have chest pain or pressure that lasts for more  than a few minutes. Along with the chest pain you may:  ? Have pain or discomfort in one or both arms, your back, neck, jaw, or stomach.  ? Have shortness of breath.  ? Break out in a cold sweat.  ? Feel nauseous.  ? Become light-headed.  These symptoms may represent a serious problem that is an emergency. Do not wait to see if the symptoms will go away. Get medical help right away. Call your local emergency services (911 in the [place] Do not drive yourself to the hospital.  This information is not intended to replace advice given to you by your health care provider. Make sure you discuss any questions you have with your health care provider.  Document Released: 10/12/2015 Document Revised: 03/05/2016 Document Reviewed: 10/12/2015  Elsevier Interactive Patient Education  2018 [employer]

## 2018-05-25 ENCOUNTER — Other Ambulatory Visit: Payer: Self-pay | Admitting: Orthopedic Surgery

## 2018-05-25 ENCOUNTER — Inpatient Hospital Stay: Payer: Self-pay

## 2018-05-25 DIAGNOSIS — M25561 Pain in right knee: Secondary | ICD-10-CM

## 2018-06-03 ENCOUNTER — Emergency Department (HOSPITAL_COMMUNITY): Payer: No Typology Code available for payment source

## 2018-06-03 ENCOUNTER — Emergency Department (HOSPITAL_COMMUNITY)
Admission: EM | Admit: 2018-06-03 | Discharge: 2018-06-03 | Disposition: A | Discharge status: Home / Self Care | Payer: No Typology Code available for payment source | Attending: Emergency Medicine | Admitting: Emergency Medicine

## 2018-06-03 ENCOUNTER — Encounter (HOSPITAL_COMMUNITY): Payer: Self-pay | Admitting: Emergency Medicine

## 2018-06-03 DIAGNOSIS — R102 Pelvic and perineal pain: Secondary | ICD-10-CM | POA: Insufficient documentation

## 2018-06-03 DIAGNOSIS — Y929 Unspecified place or not applicable: Secondary | ICD-10-CM | POA: Insufficient documentation

## 2018-06-03 DIAGNOSIS — Y9389 Activity, other specified: Secondary | ICD-10-CM | POA: Diagnosis not present

## 2018-06-03 DIAGNOSIS — Y999 Unspecified external cause status: Secondary | ICD-10-CM | POA: Diagnosis not present

## 2018-06-03 DIAGNOSIS — S20212A Contusion of left front wall of thorax, initial encounter: Secondary | ICD-10-CM

## 2018-06-03 DIAGNOSIS — J45901 Unspecified asthma with (acute) exacerbation: Secondary | ICD-10-CM | POA: Insufficient documentation

## 2018-06-03 DIAGNOSIS — Z79899 Other long term (current) drug therapy: Secondary | ICD-10-CM | POA: Insufficient documentation

## 2018-06-03 LAB — I-STAT TROPONIN, ED: Troponin i, poc: 0.02 ng/mL (ref 0.00–0.08)

## 2018-06-03 LAB — CBC
HEMATOCRIT: 42.8 % (ref 36.0–46.0)
HEMOGLOBIN: 13.9 g/dL (ref 12.0–15.0)
MCH: 29.8 pg (ref 26.0–34.0)
MCHC: 32.5 g/dL (ref 30.0–36.0)
MCV: 91.8 fL (ref 78.0–100.0)
Platelets: 399 10*3/uL (ref 150–400)
RBC: 4.66 MIL/uL (ref 3.87–5.11)
RDW: 12.1 % (ref 11.5–15.5)
WBC: 7 10*3/uL (ref 4.0–10.5)

## 2018-06-03 LAB — BASIC METABOLIC PANEL
ANION GAP: 14 (ref 5–15)
BUN: 11 mg/dL (ref 6–20)
CHLORIDE: 106 mmol/L (ref 98–111)
CO2: 23 mmol/L (ref 22–32)
Calcium: 9.6 mg/dL (ref 8.9–10.3)
Creatinine, Ser: 0.83 mg/dL (ref 0.44–1.00)
GFR calc Af Amer: >60 mL/min (ref >60)
GLUCOSE: 129 mg/dL — AB (ref 70–99)
POTASSIUM: 3.2 mmol/L — AB (ref 3.5–5.1)
Sodium: 143 mmol/L (ref 135–145)

## 2018-06-03 LAB — I-STAT BETA HCG BLOOD, ED (MC, WL, AP ONLY): I-stat hCG, quantitative: <5 m[IU]/mL (ref <5)

## 2018-06-03 MED ORDER — NAPROXEN 500 MG PO TABS: 500.0000 mg | ORAL_TABLET | Freq: Two times a day (BID) | ORAL | 0 refills | Status: DC

## 2018-06-03 MED ORDER — IOPAMIDOL (ISOVUE-300) INJECTION 61%
INTRAVENOUS | Status: AC
  Administered 2018-06-03: 100 mL
  Filled 2018-06-03: qty 100

## 2018-06-03 MED ORDER — FENTANYL CITRATE (PF) 100 MCG/2ML IJ SOLN
50.0000 ug | Freq: Once | INTRAMUSCULAR | Status: AC
  Administered 2018-06-03: 50 ug via INTRAVENOUS
  Filled 2018-06-03: qty 2
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
## 2018-06-03 NOTE — ED Notes (Signed)
Pt taken to ct 

## 2018-06-03 NOTE — Discharge Instructions (Signed)
Your testing is negative for rib fracture or collapsed lung.  Take the anti-inflammatory as prescribed and follow-up with your doctor.  Return to the ED if you develop new or worsening symptoms.

## 2018-06-03 NOTE — ED Notes (Signed)
Pt up to the br.  More pain with movement

## 2018-06-21 ENCOUNTER — Other Ambulatory Visit: Payer: Self-pay | Admitting: Orthopedic Surgery

## 2018-06-21 DIAGNOSIS — M2391 Unspecified internal derangement of right knee: Secondary | ICD-10-CM

## 2018-07-04 ENCOUNTER — Telehealth: Payer: Self-pay | Admitting: General Practice

## 2018-07-04 NOTE — Telephone Encounter (Signed)
Patient called stating that her blood sugar is running high and would like to speak to nurse. Please follow up.
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
## 2018-07-05 NOTE — Telephone Encounter (Signed)
Patient called requesting a call back, please follow up.

## 2018-07-10 IMAGING — CT CT ANGIO CHEST
2 of 6 series · 18 of 36 positions shown · IV contrast (isovue)
Comparison: Chest radiograph performed 09/10/2016

CLINICAL DATA: Acute onset of dyspnea on exertion and hypoxia.
Initial encounter.

EXAM:
CT ANGIOGRAPHY CHEST WITH CONTRAST
TECHNIQUE: Multidetector CT imaging of the chest was performed using the
standard protocol during bolus administration of intravenous
contrast. Multiplanar CT image reconstructions and MIPs were
obtained to evaluate the vascular anatomy.
CONTRAST:  100 mL of Isovue 370 IV contrast

[Series 6: pe thins · axial · 0.79mm/px · z∈[+1133,+1321]mm · 17 of 212 slices shown]
[im 12/212  lung]
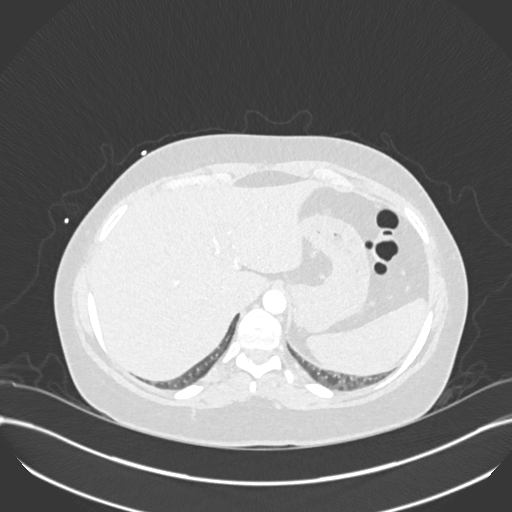
[im 24/212  mediastinal]
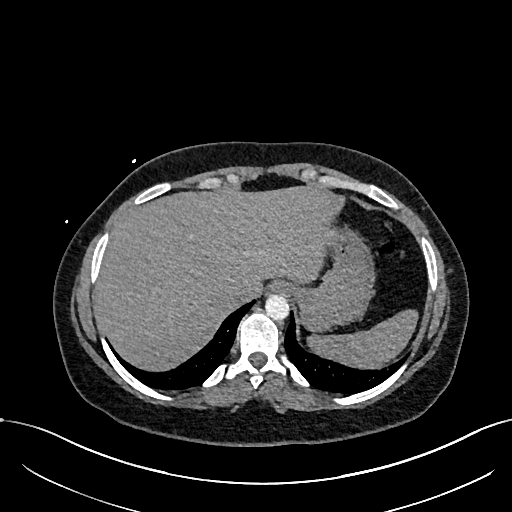
[im 36/212  lung]
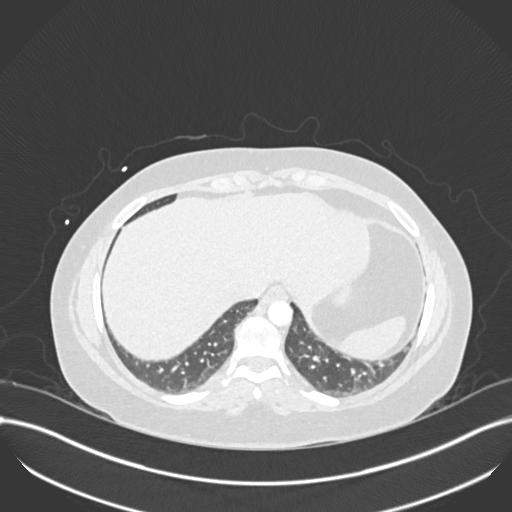
[im 47/212  mediastinal]
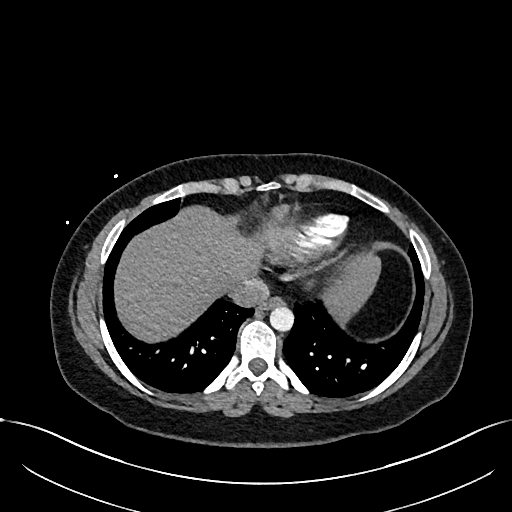
[im 59/212  lung]
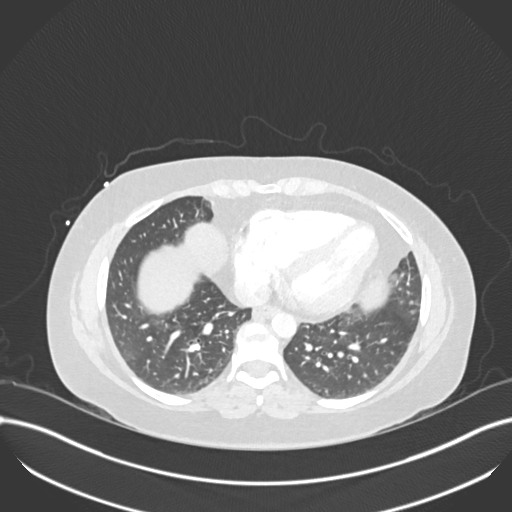
[im 71/212  mediastinal]
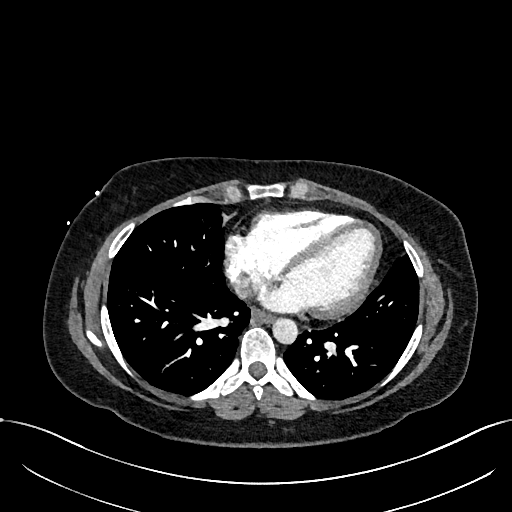
[im 83/212  lung]
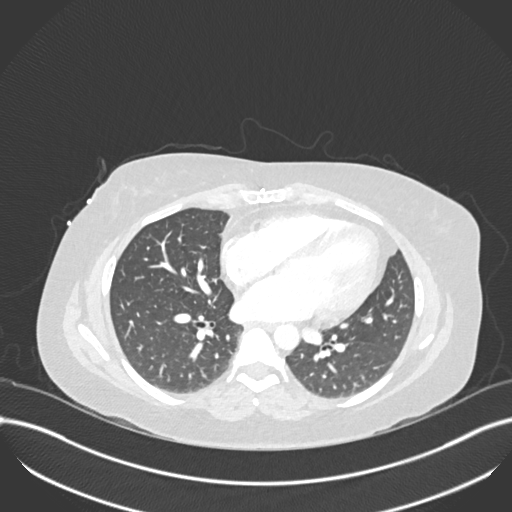
[im 94/212  mediastinal]
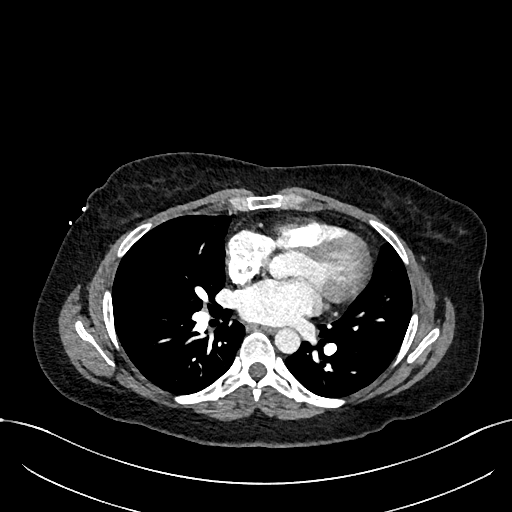
[im 106/212  lung]
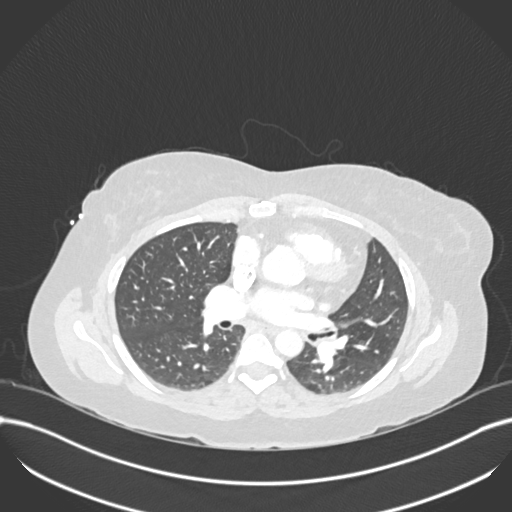
[im 118/212  mediastinal]
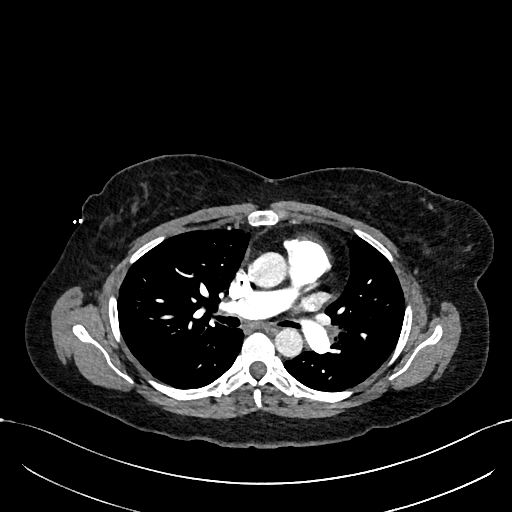
[im 129/212  lung]
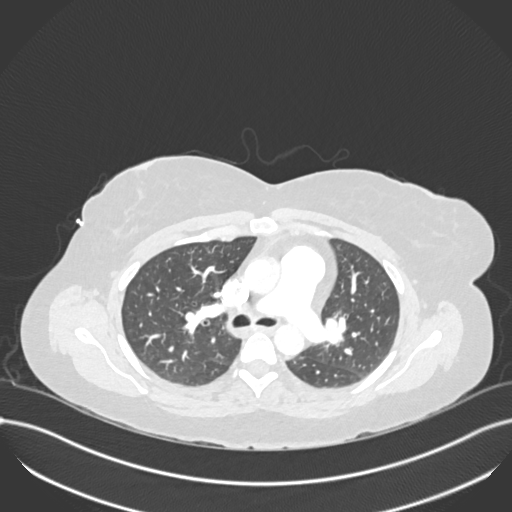
[im 141/212  mediastinal]
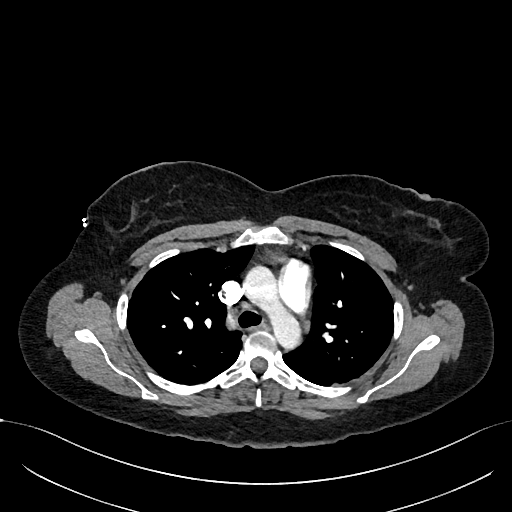
[im 153/212  lung]
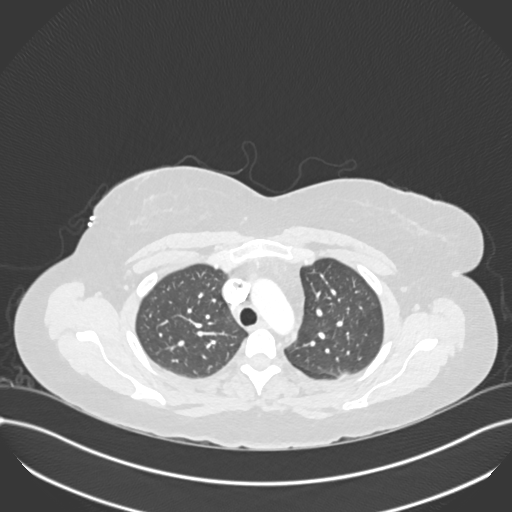
[im 165/212  mediastinal]
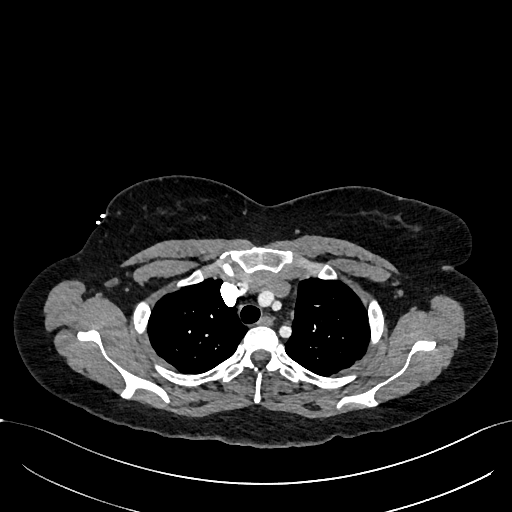
[im 176/212  lung]
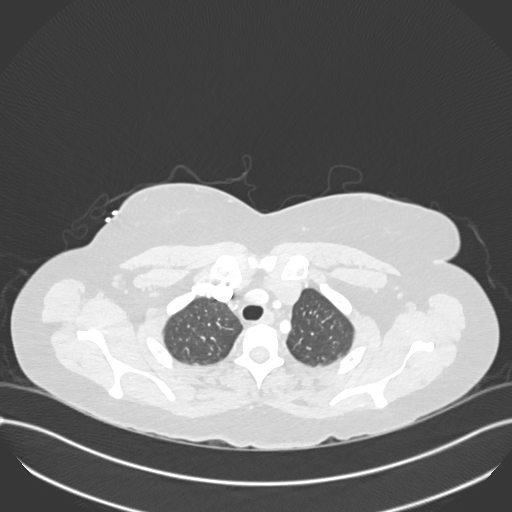
[im 188/212  mediastinal]
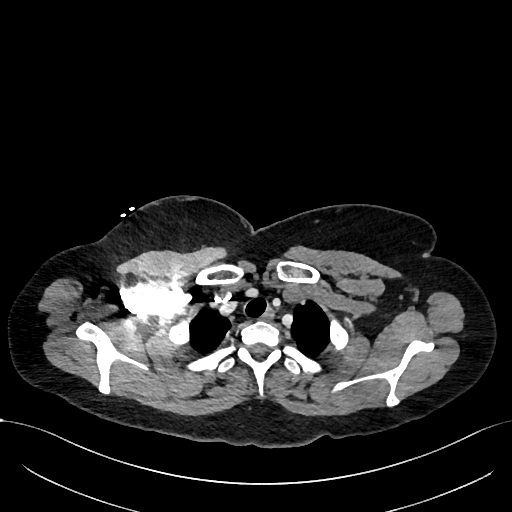
[im 200/212  lung]
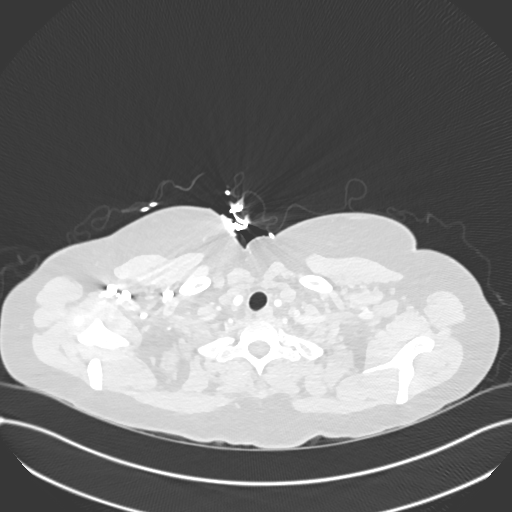

[Series 7: pe 2mm cor · coronal · 0.42mm/px · 1 of 110 slices shown]
[im 55/110  mediastinal]
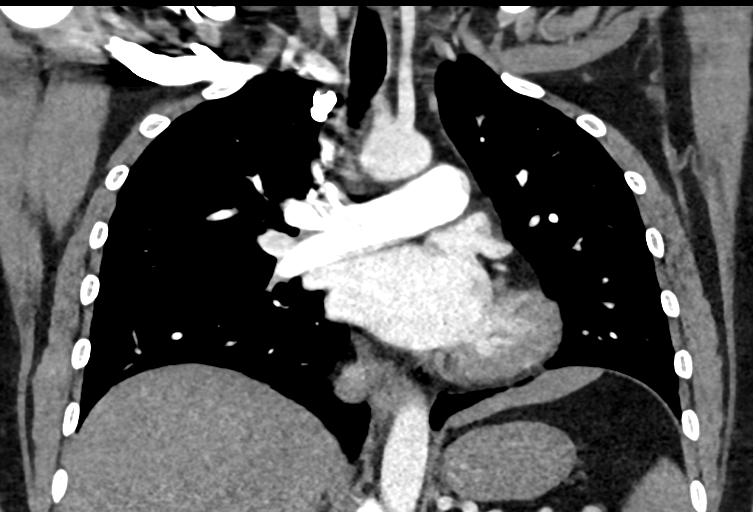

[18 of 36 positions shown; findings below may reference images not displayed]

FINDINGS: Cardiovascular:  There is no evidence of pulmonary embolus.

The heart is unremarkable in appearance. The thoracic aorta is
unremarkable. No calcific atherosclerotic disease is seen. The great
vessels are grossly unremarkable in appearance.

Mediastinum/Nodes: The mediastinum is unremarkable in appearance. No
mediastinal lymphadenopathy is seen. No pericardial effusion is
identified. The patient is status post median sternotomy. The
visualized portions of the thyroid gland are unremarkable. No
axillary lymphadenopathy is seen.

Lungs/Pleura: Minimal bibasilar atelectasis is noted. The lungs are
otherwise clear. No masses are identified. No pleural effusion or
pneumothorax is seen.

Upper Abdomen: The visualized portions of the liver and spleen are
grossly unremarkable.

Musculoskeletal: No acute osseous abnormalities are identified. The
visualized musculature is unremarkable in appearance.

Review of the MIP images confirms the above findings.
IMPRESSION: 1. No evidence of pulmonary embolus.
2. Minimal bibasilar atelectasis noted.  Lungs otherwise clear.

## 2018-07-12 ENCOUNTER — Ambulatory Visit
Admission: RE | Admit: 2018-07-12 | Discharge: 2018-07-12 | Disposition: A | Discharge status: Home / Self Care | Payer: Worker's Compensation | Source: Ambulatory Visit | Attending: Orthopedic Surgery | Admitting: Orthopedic Surgery

## 2018-07-12 ENCOUNTER — Other Ambulatory Visit: Payer: Self-pay

## 2018-07-12 DIAGNOSIS — M2391 Unspecified internal derangement of right knee: Secondary | ICD-10-CM

## 2018-07-12 MED ORDER — IOPAMIDOL (ISOVUE-M 200) INJECTION 41%
25.0000 mL | Freq: Once | INTRAMUSCULAR | Status: AC
  Administered 2018-07-12: 25 mL via INTRA_ARTICULAR

## 2018-07-20 ENCOUNTER — Other Ambulatory Visit: Payer: Self-pay

## 2018-07-20 ENCOUNTER — Ambulatory Visit: Payer: Self-pay | Attending: Family Medicine | Admitting: Physician Assistant

## 2018-07-20 ENCOUNTER — Ambulatory Visit (HOSPITAL_COMMUNITY)
Admission: RE | Admit: 2018-07-20 | Discharge: 2018-07-20 | Disposition: A | Discharge status: Home / Self Care | Payer: Medicaid Other | Source: Ambulatory Visit | Attending: Physician Assistant | Admitting: Physician Assistant

## 2018-07-20 VITALS — BP 118/83 | HR 78 | Temp 98.5°F | Resp 18 | Ht 66.0 in | Wt 190.0 lb

## 2018-07-20 DIAGNOSIS — Z79899 Other long term (current) drug therapy: Secondary | ICD-10-CM | POA: Insufficient documentation

## 2018-07-20 DIAGNOSIS — R110 Nausea: Secondary | ICD-10-CM | POA: Insufficient documentation

## 2018-07-20 DIAGNOSIS — R51 Headache: Secondary | ICD-10-CM | POA: Insufficient documentation

## 2018-07-20 DIAGNOSIS — Z23 Encounter for immunization: Secondary | ICD-10-CM

## 2018-07-20 DIAGNOSIS — Z881 Allergy status to other antibiotic agents status: Secondary | ICD-10-CM | POA: Insufficient documentation

## 2018-07-20 DIAGNOSIS — G8929 Other chronic pain: Secondary | ICD-10-CM | POA: Insufficient documentation

## 2018-07-20 DIAGNOSIS — M5441 Lumbago with sciatica, right side: Secondary | ICD-10-CM | POA: Insufficient documentation

## 2018-07-20 DIAGNOSIS — M79604 Pain in right leg: Secondary | ICD-10-CM | POA: Insufficient documentation

## 2018-07-20 DIAGNOSIS — R519 Headache, unspecified: Secondary | ICD-10-CM

## 2018-07-20 DIAGNOSIS — Z880 Allergy status to penicillin: Secondary | ICD-10-CM | POA: Insufficient documentation

## 2018-07-20 MED ORDER — METHOCARBAMOL 500 MG PO TABS: 1000.0000 mg | ORAL_TABLET | Freq: Three times a day (TID) | ORAL | 0 refills | Status: DC

## 2018-07-20 MED ORDER — IBUPROFEN 800 MG PO TABS: 800.0000 mg | ORAL_TABLET | Freq: Three times a day (TID) | ORAL | 0 refills | Status: DC

## 2018-07-20 MED ORDER — ONDANSETRON HCL 4 MG PO TABS: 4.0000 mg | ORAL_TABLET | Freq: Three times a day (TID) | ORAL | 0 refills | Status: DC | PRN

## 2018-07-20 MED FILL — METHOCARBAMOL 500 MG TABS: 500 | 15 days supply | Qty: 90 | Fill #0

## 2018-07-20 MED FILL — IBUPROFEN 800 MG TABLET: 800 | 30 days supply | Qty: 90 | Fill #0

## 2018-07-20 MED FILL — ONDANSETRON HCL 4 MG TABLET: 4 | 10 days supply | Qty: 30 | Fill #0

## 2018-07-20 NOTE — Progress Notes (Signed)
Back pain: right side lower down the leg, couple months. 10   Headaches: throw up, 10   Fell in august at work, broke fibula from fall, slipped on water.

## 2018-07-20 NOTE — Progress Notes (Signed)
Patient ID: Michelle Gibson, female   DOB: [birth year], 42 y.o.   MRN: [id number]      Michelle Gibson, is a 42 y.o. female  ZHG:[id number]  [id number]  DOB - November 28, 1975  Subjective:  Chief Complaint and HPI: Michelle Gibson is a 42 y.o. female here today with Pain in R leg, shooting pains.  Pain in middle low back that radiates down R leg.  Pain is moderate to severe.  +occurring on and off X 2 months since slip and fall on water and breaking her fibula.  No imaging was done of the Lower back.  Ibuprofen helps little.  No weakness.   Seeing ortho for fibula fracture that is slow healing. Just had CT 07/12/2018.    Also having frequent HA for about 2 months.  The HA are all over her head and in the back of neck and on both sides.  No vision changes.  She also has vomiting and photophobia when the HA occur.  She has had HA like this in the past.  Not new.  No precipitating factors.  No aura.  Pain is moderate.  Ibuprofen helps little.    FH:  No FH aneurysm.     ROS:   Constitutional:  No f/c, No night sweats, No unexplained weight loss. EENT:  No vision changes, No blurry vision, No hearing changes. No mouth, throat, or ear problems.  Respiratory: No cough, No SOB Cardiac: No CP, no palpitations GI:  No abd pain, No N/V/D.(except with HA) GU: No Urinary s/sx Musculoskeletal: + back and leg pain Neuro: + headache, no dizziness, no motor weakness.  Skin: No rash Endocrine:  No polydipsia. No polyuria.  Psych: Denies SI/HI  No problems updated.  ALLERGIES: Allergies  Allergen Reactions  . Ciprofloxacin Hives  . Flecainide Hives, Nausea And Vomiting and Rash  . Penicillin G Rash    Has patient had a PCN reaction causing immediate rash, facial/tongue/throat swelling, SOB or lightheadedness with hypotension: yes Has patient had a PCN reaction causing severe rash involving mucus membranes or skin necrosis: yes Has patient had a PCN reaction that required hospitalization: no Has patient had  a PCN reaction occurring within the last 10 years: no If all of the above answers are "NO", then may proceed with Cephalosporin use.     PAST MEDICAL HISTORY: Past Medical History:  Diagnosis Date  . Asthma   . WPW (Wolff-Parkinson-White syndrome)     MEDICATIONS AT HOME: Prior to Admission medications   Medication Sig Start Date End Date Taking? Authorizing Provider  acetaminophen (TYLENOL) 325 MG tablet Take 2 tablets (650 mg total) by mouth every 6 (six) hours as needed for fever. 11/14/17  Yes Hairston, Maylon Peppers, FNP  albuterol (PROVENTIL HFA;VENTOLIN HFA) 108 (90 Base) MCG/ACT inhaler Inhale 2 puffs into the lungs every 4 (four) hours as needed for wheezing or shortness of breath. 10/17/17  Yes Hairston, Maylon Peppers, FNP  ibuprofen (ADVIL,MOTRIN) 800 MG tablet Take 1 tablet (800 mg total) by mouth 3 (three) times daily. X 7 days then prn pain Eat prior to taking this medication 07/20/18  Yes Marce Charlesworth M, PA-C  ipratropium-albuterol (DUONEB) 0.5-2.5 (3) MG/3ML SOLN Take 3 mLs by nebulization every 6 (six) hours as needed (wheezing or shortness of breath). 11/14/17  Yes Hairston, Maylon Peppers, FNP  meclizine (ANTIVERT) 25 MG tablet Take 1 tablet (25 mg total) by mouth 3 (three) times daily as needed. 12/07/17  Yes Ena Dawley, Tiffany S, PA-C  methocarbamol (ROBAXIN) 500 MG  tablet Take 2 tablets (1,000 mg total) by mouth 3 (three) times daily. X 1 week then prn pain 07/20/18  Yes Keyaria Lawson M, PA-C  mometasone-formoterol (DULERA) 200-5 MCG/ACT AERO Inhale 2 puffs into the lungs 2 (two) times daily. 10/17/17  Yes Hairston, Mandesia R, FNP  ondansetron (ZOFRAN) 4 MG tablet Take 1 tablet (4 mg total) by mouth every 8 (eight) hours as needed for nausea or vomiting. 07/20/18   Argentina Donovan, PA-C     Objective:  EXAM:   Vitals:   07/20/18 0903  BP: 118/83  Pulse: 78  Resp: 18  Temp: 98.5 F (36.9 C)  TempSrc: Oral  SpO2: 96%  Weight: 190 lb (86.2 kg)  Height: 5\' 6"  (1.676 m)     General appearance : A&OX3. NAD. Non-toxic-appearing HEENT: Atraumatic and Normocephalic.  Pupils constricted.  EOM intact.  TM clear B. Mouth-MMM, post pharynx WNL w/o erythema, No PND. Neck: supple, no JVD. No cervical lymphadenopathy. No thyromegaly Chest/Lungs:  Breathing-non-labored, Good air entry bilaterally, breath sounds normal without rales, rhonchi, or wheezing  CVS: S1 S2 regular, no murmurs, gallops, rubs  Back ROM ~80% of normal.  Neg SLR B. Extremities: Bilateral Lower Ext shows no edema, both legs are warm to touch with = pulse throughout Neurology:  CN II-XII grossly intact, Non focal.   Psych:  TP linear. J/I WNL. Normal speech. Appropriate eye contact and affect.  Skin:  No Rash  Data Review Lab Results  Component Value Date   HGBA1C 5.7 10/17/2017   HGBA1C 6.3 (H) 09/15/2016     Assessment & Plan   1. Nonintractable episodic headache, unspecified headache type No red flags-likely tension type given distribution - ibuprofen (ADVIL,MOTRIN) 800 MG tablet; Take 1 tablet (800 mg total) by mouth 3 (three) times daily. X 7 days then prn pain Eat prior to taking this medication  Dispense: 90 tablet; Refill: 0  2. Chronic right-sided low back pain with right-sided sciatica No red flags - DG Lumbar Spine Complete; Future - ibuprofen (ADVIL,MOTRIN) 800 MG tablet; Take 1 tablet (800 mg total) by mouth 3 (three) times daily. X 7 days then prn pain Eat prior to taking this medication  Dispense: 90 tablet; Refill: 0 - methocarbamol (ROBAXIN) 500 MG tablet; Take 2 tablets (1,000 mg total) by mouth 3 (three) times daily. X 1 week then prn pain  Dispense: 90 tablet; Refill: 0  3. Nausea - ondansetron (ZOFRAN) 4 MG tablet; Take 1 tablet (4 mg total) by mouth every 8 (eight) hours as needed for nausea or vomiting.  Dispense: 30 tablet; Refill: 0   Patient have been counseled extensively about nutrition and exercise  Return in about 3 weeks (around 08/10/2018) for Dr  Fulp-recheck back/leg pain and HA.  The patient was given clear instructions to go to ER or return to medical center if symptoms don't improve, worsen or new problems develop. The patient verbalized understanding. The patient was told to call to get lab results if they haven't heard anything in the next week.     Freeman Caldron, PA-C [facility] and Tamora [place], [place]   07/20/2018, 9:28 AM

## 2018-07-21 ENCOUNTER — Telehealth: Payer: Self-pay

## 2018-07-21 ENCOUNTER — Other Ambulatory Visit: Payer: Self-pay | Admitting: Physician Assistant

## 2018-07-21 DIAGNOSIS — R937 Abnormal findings on diagnostic imaging of other parts of musculoskeletal system: Secondary | ICD-10-CM

## 2018-07-21 NOTE — Telephone Encounter (Signed)
-----   Message from Argentina Donovan, [place] sent at 07/21/2018  8:23 AM EDT ----- Please call patient:  It appears the nerve stimulator she had has moved from where it was originally placed.  Who implanted the stimulator?  I recommend scheduling an appointment with that physician to discuss this.  If it was not in [place], we can make a referral for follow-up locally.  Otherwise, the xray was normal.  Thanks, Freeman Caldron, PA-C

## 2018-07-21 NOTE — Telephone Encounter (Signed)
I will set up a referral to a neurosurgeon.  Thank you!

## 2018-07-21 NOTE — Telephone Encounter (Signed)
Patient was called, answered, verified dob, and was given most recent result note. Patient verbalized understanding and had no further questions. Patient would like referral to be sent locally.

## 2018-08-11 ENCOUNTER — Ambulatory Visit: Payer: Self-pay | Admitting: Family Medicine

## 2018-08-21 ENCOUNTER — Telehealth: Payer: Self-pay | Admitting: General Practice

## 2018-08-21 NOTE — Telephone Encounter (Signed)
Pt called to request advice on her recent throat pain until the day of her appt, please advise

## 2018-08-22 NOTE — Telephone Encounter (Signed)
Attempt to call patient back regarding throat pain. No answer, unable to leave message on phone number.

## 2018-08-23 ENCOUNTER — Ambulatory Visit: Payer: Self-pay | Attending: Family Medicine | Admitting: Family Medicine

## 2018-08-23 ENCOUNTER — Encounter: Payer: Self-pay | Admitting: Family Medicine

## 2018-08-23 VITALS — BP 119/89 | HR 86 | Temp 98.9°F | Resp 16 | Ht 66.0 in | Wt 197.6 lb

## 2018-08-23 DIAGNOSIS — R7303 Prediabetes: Secondary | ICD-10-CM

## 2018-08-23 DIAGNOSIS — Z131 Encounter for screening for diabetes mellitus: Secondary | ICD-10-CM

## 2018-08-23 DIAGNOSIS — J029 Acute pharyngitis, unspecified: Secondary | ICD-10-CM

## 2018-08-23 DIAGNOSIS — Z7984 Long term (current) use of oral hypoglycemic drugs: Secondary | ICD-10-CM | POA: Insufficient documentation

## 2018-08-23 DIAGNOSIS — R590 Localized enlarged lymph nodes: Secondary | ICD-10-CM | POA: Insufficient documentation

## 2018-08-23 LAB — POCT GLYCOSYLATED HEMOGLOBIN (HGB A1C): HbA1c, POC (prediabetic range): 6.3 % (ref 5.7–6.4)

## 2018-08-23 LAB — GLUCOSE, POCT (MANUAL RESULT ENTRY): POC Glucose: 229 mg/dL — AB (ref 70–99)

## 2018-08-23 LAB — POCT RAPID STREP A (OFFICE): Rapid Strep A Screen: NEGATIVE

## 2018-08-23 MED ORDER — FLUCONAZOLE 150 MG PO TABS: 150.0000 mg | ORAL_TABLET | Freq: Once | ORAL | 0 refills | Status: AC

## 2018-08-23 MED ORDER — METFORMIN HCL 500 MG PO TABS: 500.0000 mg | ORAL_TABLET | Freq: Two times a day (BID) | ORAL | 6 refills | Status: DC

## 2018-08-23 MED ORDER — CLINDAMYCIN HCL 300 MG PO CAPS: 300.0000 mg | ORAL_CAPSULE | Freq: Two times a day (BID) | ORAL | 0 refills | Status: DC

## 2018-08-23 MED ORDER — PREDNISONE 20 MG PO TABS: 20.0000 mg | ORAL_TABLET | Freq: Every day | ORAL | 0 refills | Status: DC

## 2018-08-23 MED ORDER — TETANUS-DIPHTH-ACELL PERTUSSIS 5-2.5-18.5 LF-MCG/0.5 IM SUSP: 0.5000 mL | Freq: Once | INTRAMUSCULAR | 0 refills | Status: AC

## 2018-08-23 NOTE — Progress Notes (Signed)
Subjective:    Patient ID: Michelle Gibson, female    DOB: 16-Jan-1976, 42 y.o.   MRN: [id number]  HPI       42 year old female who presents secondary to complaint of sore throat.  Patient states that last week she felt as if she had an allergic reaction to something that was on her clothing when she pulled her clothing over her head.  Patient states that she has a sensation that her throat was closing/swelling and patient states that she took Benadryl repeatedly for a few days and the sensation went away.  Patient however states that since that time she has felt as if her throat is still sore/irritated and patient has felt as if the lymph nodes in her neck, especially on the right side have been swollen and tender to touch.      Patient also reports history of prediabetes.  Patient was on metformin in the past but stopped the use of this medication.  Patient has had some fatigue but has not felt as if she is having urinary frequency, increased thirst or blurred vision.  Patient reports that her past medical history is significant for seasonal allergic rhinitis, endometriosis and interstitial cystitis.  Patient thinks that she may have had asthma in childhood but has had no recent issues with asthma.   patient also states that at about age 66 she had to have an open heart surgery for treatment of Wolff-Parkinson-White syndrome.  Patient states that she has also had a C-section x2 and had removal of her gallbladder.  Patient is also had hysterectomy and appendectomy due to endometriosis.  Patient reports a history of allergies to bee stings which cause her to have swelling.  Patient declines prescription for EpiPen.  Patient also with allergy to penicillin which causes her to have a rash.    Review of Systems  Constitutional: Positive for fatigue. Negative for chills and fever.  HENT: Positive for congestion, postnasal drip, rhinorrhea and sore throat. Negative for ear pain, mouth sores, sinus pressure, sinus  pain, trouble swallowing and voice change.   Respiratory: Negative for cough and shortness of breath.   Cardiovascular: Negative for chest pain, palpitations and leg swelling.  Gastrointestinal: Negative for abdominal pain and nausea.  Endocrine: Negative for polydipsia and polyphagia.  Genitourinary: Negative for dysuria and frequency.  Musculoskeletal: Negative for arthralgias and myalgias.  Neurological: Negative for dizziness and headaches.       Objective:   Physical Exam BP 119/89   Pulse 86   Temp 98.9 F (37.2 C) (Oral)   Resp 16   Ht 5\' 6"  (1.676 m)   Wt 197 lb 9.6 oz (89.6 kg)   SpO2 98%   BMI 31.89 kg/m Nurse's notes and vital signs reviewed General-well-nourished, well-developed female in no acute distress ENT- TMs dull, patient with moderate edema of the nasal turbinates with mild clear nasal discharge, patient with posterior pharynx and tonsillar arch edema/erythema, no exudate noted but patient does appear to have a small erosion near the right tonsil Neck-supple, patient with some mild anterior right-sided cervical chain lymphadenopathy, no thyromegaly Lungs-clear to auscultation bilaterally Cardiovascular-regular rate and rhythm Back-no CVA tenderness Abdomen-soft and nontender Extremities-no edema        Assessment & Plan:  1. Sore throat Patient with complaint of sore throat since recent possible allergic reaction.  Patient had rapid strep test which was negative.  Patient however does appear to have some throat irritation as well as mild cervical lymphadenopathy.  Patient  is allergic to penicillin therefore she will be placed on clindamycin.  Since patient with a recent suspected allergic reaction, patient is also being placed on a very short taper of prednisone patient may take over-the-counter medication as needed for pain and gargle with warm salt water. - POCT rapid strep A - clindamycin (CLEOCIN) 300 MG capsule; Take 1 capsule (300 mg total) by mouth 2  (two) times daily.  Dispense: 14 capsule; Refill: 0 - predniSONE (DELTASONE) 20 MG tablet; Take 1 tablet (20 mg total) by mouth daily with breakfast. One pill today then 1/2 pill daily for 4 days  Dispense: 3 tablet; Refill: 0  2. Diabetes mellitus screening Patient with a history of diabetes or prediabetes for which she was on metformin in the past.  CMA obtained glucose and hemoglobin A1c per protocol.  Patient's blood sugar was very elevated at 229 postprandially and patient with hemoglobin A1c of 6.3 consistent with prediabetes. - POCT glucose (manual entry) - POCT glycosylated hemoglobin (Hb A1C)  3. Prediabetes I discussed with the patient her hemoglobin A1c finding of 6.3 and blood sugar of 229 here in the office today.  Patient agrees to restart the use of Glucophage taking 1 tablet twice daily to help with insulin resistance and to help decrease risk of becoming diabetic.  Discussed a low carbohydrate diet and regular exercise to further help control blood sugars.  Patient is aware that the short course of prednisone given at today's visit for her sore throat will likely cause a temporary increase in her blood sugars.  Patient is encouraged to remain well hydrated and engage in low impact cardiovascular exercise which will also help with her blood sugars. - metFORMIN (GLUCOPHAGE) 500 MG tablet; Take 1 tablet (500 mg total) by mouth 2 (two) times daily with a meal.  Dispense: 60 tablet; Refill: 6  An After Visit Summary was printed and given to the patient.  Return if symptoms worsen or fail to improve, for prediabetes-4 weeks.

## 2018-08-23 NOTE — Patient Instructions (Signed)
Fall Prevention in the Home Falls can cause injuries. They can happen to people of all ages. There are many things you can do to make your home safe and to help prevent falls. What can I do on the outside of my home?  Regularly fix the edges of walkways and driveways and fix any cracks.  Remove anything that might make you trip as you walk through a door, such as a raised step or threshold.  Trim any bushes or trees on the path to your home.  Use bright outdoor lighting.  Clear any walking paths of anything that might make someone trip, such as rocks or tools.  Regularly check to see if handrails are loose or broken. Make sure that both sides of any steps have handrails.  Any raised decks and porches should have guardrails on the edges.  Have any leaves, snow, or ice cleared regularly.  Use sand or salt on walking paths during winter.  Clean up any spills in your garage right away. This includes oil or grease spills. What can I do in the bathroom?  Use night lights.  Install grab bars by the toilet and in the tub and shower. Do not use towel bars as grab bars.  Use non-skid mats or decals in the tub or shower.  If you need to sit down in the shower, use a plastic, non-slip stool.  Keep the floor dry. Clean up any water that spills on the floor as soon as it happens.  Remove soap buildup in the tub or shower regularly.  Attach bath mats securely with double-sided non-slip rug tape.  Do not have throw rugs and other things on the floor that can make you trip. What can I do in the bedroom?  Use night lights.  Make sure that you have a light by your bed that is easy to reach.  Do not use any sheets or blankets that are too big for your bed. They should not hang down onto the floor.  Have a firm chair that has side arms. You can use this for support while you get dressed.  Do not have throw rugs and other things on the floor that can make you trip. What can I do in the  kitchen?  Clean up any spills right away.  Avoid walking on wet floors.  Keep items that you use a lot in easy-to-reach places.  If you need to reach something above you, use a strong step stool that has a grab bar.  Keep electrical cords out of the way.  Do not use floor polish or wax that makes floors slippery. If you must use wax, use non-skid floor wax.  Do not have throw rugs and other things on the floor that can make you trip. What can I do with my stairs?  Do not leave any items on the stairs.  Make sure that there are handrails on both sides of the stairs and use them. Fix handrails that are broken or loose. Make sure that handrails are as long as the stairways.  Check any carpeting to make sure that it is firmly attached to the stairs. Fix any carpet that is loose or worn.  Avoid having throw rugs at the top or bottom of the stairs. If you do have throw rugs, attach them to the floor with carpet tape.  Make sure that you have a light switch at the top of the stairs and the bottom of the stairs. If you do   not have them, ask someone to add them for you. What else can I do to help prevent falls?  Wear shoes that: ? Do not have high heels. ? Have rubber bottoms. ? Are comfortable and fit you well. ? Are closed at the toe. Do not wear sandals.  If you use a stepladder: ? Make sure that it is fully opened. Do not climb a closed stepladder. ? Make sure that both sides of the stepladder are locked into place. ? Ask someone to hold it for you, if possible.  Clearly mark and make sure that you can see: ? Any grab bars or handrails. ? First and last steps. ? Where the edge of each step is.  Use tools that help you move around (mobility aids) if they are needed. These include: ? Canes. ? Walkers. ? Scooters. ? Crutches.  Turn on the lights when you go into a dark area. Replace any light bulbs as soon as they burn out.  Set up your furniture so you have a clear path.  Avoid moving your furniture around.  If any of your floors are uneven, fix them.  If there are any pets around you, be aware of where they are.  Review your medicines with your doctor. Some medicines can make you feel dizzy. This can increase your chance of falling. Ask your doctor what other things that you can do to help prevent falls. This information is not intended to replace advice given to you by your health care provider. Make sure you discuss any questions you have with your health care provider. Document Released: 07/24/2009 Document Revised: 03/04/2016 Document Reviewed: 11/01/2014 Elsevier Interactive Patient Education  2018 [place] (Tetanus and Diphtheria): What You Need to Know 1. Why get vaccinated? Tetanus  and diphtheria are very serious diseases. They are rare in the [place] today, but people who do become infected often have severe complications. Td vaccine is used to protect adolescents and adults from both of these diseases. Both tetanus and diphtheria are infections caused by bacteria. Diphtheria spreads from person to person through coughing or sneezing. Tetanus-causing bacteria enter the body through cuts, scratches, or wounds. TETANUS (lockjaw) causes painful muscle tightening and stiffness, usually all over the body.  It can lead to tightening of muscles in the head and neck so you can't open your mouth, swallow, or sometimes even breathe. Tetanus kills about 1 out of every 10 people who are infected even after receiving the best medical care.  DIPHTHERIA can cause a thick coating to form in the back of the throat.  It can lead to breathing problems, paralysis, heart failure, and death.  Before vaccines, as many as 200,000 cases of diphtheria and hundreds of cases of tetanus were reported in the [place] each year. Since vaccination began, reports of cases for both diseases have dropped by about 99%. 2. Td vaccine Td vaccine can  protect adolescents and adults from tetanus and diphtheria. Td is usually given as a booster dose every 10 years but it can also be given earlier after a severe and dirty wound or burn. Another vaccine, called Tdap, which protects against pertussis in addition to tetanus and diphtheria, is sometimes recommended instead of Td vaccine. Your doctor or the person giving you the vaccine can give you more information. Td may safely be given at the same time as other vaccines. 3. Some people should not get this vaccine  A person who has ever had a life-threatening  allergic reaction after a previous dose of any tetanus or diphtheria containing vaccine, OR has a severe allergy to any part of this vaccine, should not get Td vaccine. Tell the person giving the vaccine about any severe allergies.  Talk to your doctor if you: ? had severe pain or swelling after any vaccine containing diphtheria or tetanus, ? ever had a condition called Guillain Barre Syndrome (GBS), ? aren't feeling well on the day the shot is scheduled. 4. What are the risks from Td vaccine? With any medicine, including vaccines, there is a chance of side effects. These are usually mild and go away on their own. Serious reactions are also possible but are rare. Most people who get Td vaccine do not have any problems with it. Mild problems following Td vaccine: (Did not interfere with activities)  Pain where the shot was given (about 8 people in 10)  Redness or swelling where the shot was given (about 1 person in 4)  Mild fever (rare)  Headache (about 1 person in 4)  Tiredness (about 1 person in 4)  Moderate problems following Td vaccine: (Interfered with activities, but did not require medical attention)  Fever over 102F (rare)  Severe problems following Td vaccine: (Unable to perform usual activities; required medical attention)  Swelling, severe pain, bleeding and/or redness in the arm where the shot was given  (rare).  Problems that could happen after any vaccine:  People sometimes faint after a medical procedure, including vaccination. Sitting or lying down for about 15 minutes can help prevent fainting, and injuries caused by a fall. Tell your doctor if you feel dizzy, or have vision changes or ringing in the ears.  Some people get severe pain in the shoulder and have difficulty moving the arm where a shot was given. This happens very rarely.  Any medication can cause a severe allergic reaction. Such reactions from a vaccine are very rare, estimated at fewer than 1 in a million doses, and would happen within a few minutes to a few hours after the vaccination. As with any medicine, there is a very remote chance of a vaccine causing a serious injury or death. The safety of vaccines is always being monitored. For more information, visit: http://www.aguilar.org/ 5. What if there is a serious reaction? What should I look for? Look for anything that concerns you, such as signs of a severe allergic reaction, very high fever, or unusual behavior. Signs of a severe allergic reaction can include hives, swelling of the face and throat, difficulty breathing, a fast heartbeat, dizziness, and weakness. These would usually start a few minutes to a few hours after the vaccination. What should I do?  If you think it is a severe allergic reaction or other emergency that can't wait, call 9-1-1 or get the person to the nearest hospital. Otherwise, call your doctor.  Afterward, the reaction should be reported to the Vaccine Adverse Event Reporting System (VAERS). Your doctor might file this report, or you can do it yourself through the VAERS web site at www.vaers.SamedayNews.es, or by calling 781-706-9891. ? VAERS does not give medical advice. 6. The National Vaccine Injury Compensation Program The [employer] Vaccine Injury Compensation Program (VICP) is a federal program that was created to compensate people who may have  been injured by certain vaccines. Persons who believe they may have been injured by a vaccine can learn about the program and about filing a claim by calling (872) 602-5066 or visiting the [employer] website at GoldCloset.com.ee. There is a  time limit to file a claim for compensation. 7. How can I learn more?  Ask your doctor. He or she can give you the vaccine package insert or suggest other sources of information.  Call your local or state health department.  Contact the Centers for Disease Control and Prevention (CDC): ? Call 308 062 7767 (1-800-CDC-INFO) ? Visit CDC's website at http://hunter.com/ CDC Td Vaccine VIS (01/20/16) This information is not intended to replace advice given to you by your health care provider. Make sure you discuss any questions you have with your health care provider. Document Released: 07/25/2006 Document Revised: 06/17/2016 Document Reviewed: 06/17/2016 Elsevier Interactive Patient Education  2017 [employer].

## 2018-09-19 ENCOUNTER — Encounter: Payer: Self-pay | Admitting: Family Medicine

## 2018-09-19 ENCOUNTER — Ambulatory Visit: Payer: Self-pay | Attending: Family Medicine | Admitting: Family Medicine

## 2018-09-19 VITALS — BP 109/76 | HR 78 | Temp 98.6°F | Resp 18 | Ht 66.0 in | Wt 198.0 lb

## 2018-09-19 DIAGNOSIS — R350 Frequency of micturition: Secondary | ICD-10-CM

## 2018-09-19 DIAGNOSIS — Z794 Long term (current) use of insulin: Secondary | ICD-10-CM | POA: Insufficient documentation

## 2018-09-19 DIAGNOSIS — I456 Pre-excitation syndrome: Secondary | ICD-10-CM | POA: Insufficient documentation

## 2018-09-19 DIAGNOSIS — F17210 Nicotine dependence, cigarettes, uncomplicated: Secondary | ICD-10-CM | POA: Insufficient documentation

## 2018-09-19 DIAGNOSIS — E1165 Type 2 diabetes mellitus with hyperglycemia: Secondary | ICD-10-CM

## 2018-09-19 DIAGNOSIS — Z881 Allergy status to other antibiotic agents status: Secondary | ICD-10-CM | POA: Insufficient documentation

## 2018-09-19 DIAGNOSIS — R7303 Prediabetes: Secondary | ICD-10-CM

## 2018-09-19 DIAGNOSIS — Z90710 Acquired absence of both cervix and uterus: Secondary | ICD-10-CM | POA: Insufficient documentation

## 2018-09-19 DIAGNOSIS — Z880 Allergy status to penicillin: Secondary | ICD-10-CM | POA: Insufficient documentation

## 2018-09-19 LAB — POCT GLYCOSYLATED HEMOGLOBIN (HGB A1C): HbA1c, POC (controlled diabetic range): 6.9 % (ref 0.0–7.0)

## 2018-09-19 LAB — GLUCOSE, POCT (MANUAL RESULT ENTRY): POC Glucose: 229 mg/dL — AB (ref 70–99)

## 2018-09-19 MED ORDER — GABAPENTIN 100 MG PO CAPS: ORAL_CAPSULE | ORAL | 3 refills | Status: DC

## 2018-09-19 MED ORDER — TRUE METRIX AIR GLUCOSE METER W/DEVICE KIT: 1.0000 | PACK | Freq: Two times a day (BID) | 0 refills | Status: DC

## 2018-09-19 MED ORDER — GLUCOSE BLOOD VI STRP: ORAL_STRIP | 6 refills | Status: DC

## 2018-09-19 MED ORDER — TRUEPLUS LANCETS 28G MISC: 6 refills | Status: DC

## 2018-09-19 MED ORDER — INSULIN GLARGINE 100 UNIT/ML SOLOSTAR PEN: 5.0000 [IU] | PEN_INJECTOR | Freq: Every day | SUBCUTANEOUS | 3 refills | Status: DC

## 2018-09-19 MED FILL — TRUE METRIX TEST STRIP: 30 days supply | Qty: 100 | Fill #0

## 2018-09-19 MED FILL — !TRUE METRIX BLOOD GLUCOSE: 30 days supply | Qty: 1 | Fill #0

## 2018-09-19 MED FILL — metFORMIN HCL 500 MG TABS: 500 | 30 days supply | Qty: 60 | Fill #0

## 2018-09-19 MED FILL — FLUCONAZOLE 150 MG TABS: 150 | 1 days supply | Qty: 1 | Fill #0

## 2018-09-19 MED FILL — GABAPENTIN 100 MG CAPSULE: 100 | 30 days supply | Qty: 60 | Fill #0

## 2018-09-19 MED FILL — !LANTUS SOLOSTAR 100UNITS/M: 100 | 60 days supply | Qty: 3 | Fill #0

## 2018-09-19 MED FILL — TRUEplus LANCETS 28G MISC: 30 days supply | Qty: 100 | Fill #0

## 2018-09-19 MED FILL — predniSONE 20 MG TABS: 20 | 5 days supply | Qty: 3 | Fill #0

## 2018-09-19 MED FILL — CLINDAMYCIN HCL 300 MG CAP: 300 | 7 days supply | Qty: 14 | Fill #0

## 2018-09-19 NOTE — Progress Notes (Signed)
Subjective:    Patient ID: Michelle Gibson, female    DOB: 01-24-1976, 42 y.o.   MRN: [id number]  HPI       42 year old female who was seen in follow-up of recent visit for sore throat status post possible allergic reaction for which patient was treated with prednisone and patient also had history of gestational diabetes and patient had hemoglobin A1c which was elevated at 6.3.  Patient was asked to restart the use of her metformin at her last visit.  Patient states that since her last visit, she has developed onset of increased thirst, urinary frequency as well as a burning sensation in her feet.  Patient states that this started soon after she restarted the use of metformin twice daily.  Patient has checked her blood sugars at home with a glucometer that she purchased in the past from [place] and she states that her lowest blood sugar was 180 fasting but her blood sugar has been in the 200s.      Patient reports that she has had gestational diabetes in the past and was on insulin for a while and patient states that she would like to be back on insulin as she does not feel that oral medications work well for her diabetes.  Patient states that she has had some nausea and diarrhea with the use of the metformin which she recently restarted.  Patient denies any current abdominal pain.  Patient denies any dysuria.  No fever or chills.  Patient does feel fatigued.  Past Medical History:  Diagnosis Date  . Asthma   . Diabetes mellitus without complication (HCC)    has had high blood sugars in 300s for months now  . WPW (Wolff-Parkinson-White syndrome)    Past Surgical History:  Procedure Laterality Date  . ABDOMINAL HYSTERECTOMY    . CARDIAC SURGERY    . CESAREAN SECTION     Social History   Tobacco Use  . Smoking status: Current Every Day Smoker    Packs/day: 0.10    Types: Cigarettes  . Smokeless tobacco: Never Used  Substance Use Topics  . Alcohol use: No  . Drug use: No   Allergies    Allergen Reactions  . Ciprofloxacin Hives  . Flecainide Hives, Nausea And Vomiting and Rash  . Penicillin G Rash    Has patient had a PCN reaction causing immediate rash, facial/tongue/throat swelling, SOB or lightheadedness with hypotension: yes Has patient had a PCN reaction causing severe rash involving mucus membranes or skin necrosis: yes Has patient had a PCN reaction that required hospitalization: no Has patient had a PCN reaction occurring within the last 10 years: no If all of the above answers are "NO", then may proceed with Cephalosporin use.    Review of Systems  Constitutional: Positive for fatigue. Negative for chills and fever.  HENT: Negative for sore throat and trouble swallowing.   Respiratory: Negative for cough and shortness of breath.   Cardiovascular: Negative for chest pain, palpitations and leg swelling.  Gastrointestinal: Negative for abdominal pain and nausea.  Endocrine: Positive for polydipsia, polyphagia and polyuria.  Genitourinary: Positive for frequency. Negative for dysuria and flank pain.  Musculoskeletal: Negative for arthralgias and back pain.  Neurological: Negative for dizziness and headaches.  Hematological: Negative for adenopathy. Does not bruise/bleed easily.       Objective:   Physical Exam BP 109/76   Pulse 78   Temp 98.6 F (37 C) (Oral)   Resp 18   Ht _0  (  1.676 m)   Wt 198 lb (89.8 kg)   SpO2 100%   BMI 31.96 kg/m Nurse's notes and vital signs reviewed General-well-nourished, well-developed female in no acute distress Mouth- patient with whitish sticky appearing tongue with mild posterior pharynx erythema Neck-supple, no lymphadenopathy, no carotid bruit, no thyromegaly Lungs-clear to auscultation bilaterally Cardiovascular-regular rate and rhythm Abdomen-soft,       Assessment & Plan:  1. Prediabetes At patient's most recent visit, patient with prediabetes and patient was asked to restart metformin.  CMA was unaware  that patient had recent hemoglobin A1c and therefore repeated hemoglobin A1c at today's visit which was now abnormal at 6.9 and indicative of diabetes with a value of 6.3 last month. - HgB A1c  2. Type 2 diabetes mellitus with hyperglycemia, without long-term current use of insulin (HCC) Patient with elevated glucose of 229 and hemoglobin A1c of 6.9.  Patient status post recent use of prednisone for pharyngitis/throat pain status post possible allergic reaction reported by patient at her last visit.  Patient was likely tipped into a diabetic state by the use of the prednisone but patient also reports history of gestational diabetes for which she used insulin in the past.  Patient states that she wishes to discontinue the metformin and would like to be on insulin for control of her blood sugars.  Also discussed with the patient that she may also have some improvement in blood sugars as the prednisone effects wear off.  Patient is encouraged to follow a low carbohydrate diet, increase water and increase activity level to help decrease blood sugar levels patient will be started on Lantus 5 units daily.  Patient should call if her blood sugars have not decreased below 160 fasting after starting Lantus 5 units.  Patient will return for follow-up with clinical pharmacist in the next 1 to 2 weeks but patient is aware that follow-up may not occur until 3 weeks due to upcoming clinic closures because of holiday season.  Patient should call or return sooner if any problems or concerns and patient should bring her blood sugar diary as well as glucometer at her follow-up visit.  Patient had urinalysis at today's visit to look for presence of ketones and will also have BMP.  Patient also given new prescriptions for diabetic testing supplies. - POCT URINALYSIS DIP (CLINITEK) - Basic Metabolic Panel - Glucose (CBG) - Blood Glucose Monitoring Suppl (TRUE METRIX AIR GLUCOSE METER) w/Device KIT; 1 kit by Does not apply route  2 (two) times daily.  Dispense: 1 kit; Refill: 0 - glucose blood (TRUE METRIX BLOOD GLUCOSE TEST) test strip; Use as instructed to check blood sugars twice daily and as needed  Dispense: 100 each; Refill: 6 - TRUEPLUS LANCETS 28G MISC; Use twice daily when checking blood sugars and as needed  Dispense: 100 each; Refill: 6 - Insulin Glargine (LANTUS SOLOSTAR) 100 UNIT/ML Solostar Pen; Inject 5 Units into the skin daily. To lower blood sugar (Patient taking differently: Inject 5 Units into the skin daily at 6 PM. )  Dispense: 5 pen; Refill: 3 - gabapentin (NEURONTIN) 100 MG capsule; Take 1 or 2 pills at bedtime as needed for burning in feet  Dispense: 60 capsule; Refill: 3  3. Urinary frequency Patient with complaint of recent increase in urinary frequency and thirst.  Patient with elevated blood sugars with blood sugar of 229 at today's visit which is likely a contributing factor to her urinary frequency but patient also was asked to give a sample for urinalysis  to check for presence of infection which may be causing or contributing to urinary frequency - POCT URINALYSIS DIP (CLINITEK)  An After Visit Summary was printed and given to the patient.  Return for dm- 2 weeks with Lurena Joiner; 4-5 weeks with PCP.

## 2018-09-20 ENCOUNTER — Telehealth: Payer: Self-pay | Admitting: Family Medicine

## 2018-09-20 LAB — POCT URINALYSIS DIP (CLINITEK)
Bilirubin, UA: NEGATIVE
Blood, UA: NEGATIVE
Glucose, UA: 100 mg/dL — AB
Ketones, POC UA: NEGATIVE mg/dL
Leukocytes, UA: NEGATIVE
Nitrite, UA: NEGATIVE
POC PROTEIN,UA: NEGATIVE
Spec Grav, UA: 1.02
Urobilinogen, UA: 0.2 U/dL
pH, UA: 6

## 2018-09-20 LAB — BASIC METABOLIC PANEL WITH GFR
BUN/Creatinine Ratio: 18 (ref 9–23)
BUN: 16 mg/dL (ref 6–24)
CO2: 26 mmol/L (ref 20–29)
Calcium: 9.6 mg/dL (ref 8.7–10.2)
Chloride: 100 mmol/L (ref 96–106)
Creatinine, Ser: 0.9 mg/dL (ref 0.57–1.00)
GFR calc Af Amer: 92 mL/min/1.73
GFR calc non Af Amer: 80 mL/min/1.73
Glucose: 223 mg/dL — ABNORMAL HIGH (ref 65–99)
Potassium: 4.1 mmol/L (ref 3.5–5.2)
Sodium: 141 mmol/L (ref 134–144)

## 2018-09-20 NOTE — Telephone Encounter (Signed)
Patient verified DOB Patient is aware of A1C being 6.9 which is an increase from 6.3. Patient advised to use insulin and all current medications. Patient advised to make 4 week appointment to address generalized pains and aches. No further questions.

## 2018-09-20 NOTE — Telephone Encounter (Signed)
Pt called for A1C results from yesterday. Please call pt [phone].

## 2018-09-21 ENCOUNTER — Emergency Department (HOSPITAL_COMMUNITY): Payer: Self-pay

## 2018-09-21 ENCOUNTER — Telehealth: Payer: Self-pay | Admitting: Family Medicine

## 2018-09-21 ENCOUNTER — Emergency Department (HOSPITAL_COMMUNITY)
Admission: EM | Admit: 2018-09-21 | Discharge: 2018-09-21 | Disposition: A | Discharge status: Home / Self Care | Payer: Self-pay | Attending: Emergency Medicine | Admitting: Emergency Medicine

## 2018-09-21 ENCOUNTER — Encounter (HOSPITAL_COMMUNITY): Payer: Self-pay | Admitting: Emergency Medicine

## 2018-09-21 DIAGNOSIS — Z794 Long term (current) use of insulin: Secondary | ICD-10-CM | POA: Insufficient documentation

## 2018-09-21 DIAGNOSIS — G43909 Migraine, unspecified, not intractable, without status migrainosus: Secondary | ICD-10-CM

## 2018-09-21 DIAGNOSIS — E119 Type 2 diabetes mellitus without complications: Secondary | ICD-10-CM | POA: Insufficient documentation

## 2018-09-21 DIAGNOSIS — S82839A Other fracture of upper and lower end of unspecified fibula, initial encounter for closed fracture: Secondary | ICD-10-CM | POA: Insufficient documentation

## 2018-09-21 DIAGNOSIS — J45909 Unspecified asthma, uncomplicated: Secondary | ICD-10-CM | POA: Insufficient documentation

## 2018-09-21 DIAGNOSIS — I456 Pre-excitation syndrome: Secondary | ICD-10-CM | POA: Insufficient documentation

## 2018-09-21 DIAGNOSIS — F17210 Nicotine dependence, cigarettes, uncomplicated: Secondary | ICD-10-CM | POA: Insufficient documentation

## 2018-09-21 DIAGNOSIS — Z79899 Other long term (current) drug therapy: Secondary | ICD-10-CM | POA: Insufficient documentation

## 2018-09-21 DIAGNOSIS — R410 Disorientation, unspecified: Secondary | ICD-10-CM

## 2018-09-21 HISTORY — DX: Type 2 diabetes mellitus without complications: E11.9

## 2018-09-21 HISTORY — DX: Other fracture of upper and lower end of unspecified fibula, initial encounter for closed fracture: S82.839A

## 2018-09-21 LAB — COMPREHENSIVE METABOLIC PANEL
ALK PHOS: 80 U/L (ref 38–126)
ALT: 32 U/L (ref 0–44)
ANION GAP: 11 (ref 5–15)
AST: 27 U/L (ref 15–41)
Albumin: 4.3 g/dL (ref 3.5–5.0)
BUN: 12 mg/dL (ref 6–20)
CO2: 26 mmol/L (ref 22–32)
Calcium: 10.1 mg/dL (ref 8.9–10.3)
Chloride: 102 mmol/L (ref 98–111)
Creatinine, Ser: 0.9 mg/dL (ref 0.44–1.00)
GFR calc non Af Amer: >60 mL/min (ref >60)
Glucose, Bld: 157 mg/dL — ABNORMAL HIGH (ref 70–99)
Potassium: 4.2 mmol/L (ref 3.5–5.1)
Sodium: 139 mmol/L (ref 135–145)
Total Bilirubin: 0.6 mg/dL (ref 0.3–1.2)
Total Protein: 8.3 g/dL — ABNORMAL HIGH (ref 6.5–8.1)

## 2018-09-21 LAB — CBC
HCT: 46.1 % — ABNORMAL HIGH (ref 36.0–46.0)
Hemoglobin: 14.8 g/dL (ref 12.0–15.0)
MCH: 28.9 pg (ref 26.0–34.0)
MCHC: 32.1 g/dL (ref 30.0–36.0)
MCV: 90 fL (ref 80.0–100.0)
NRBC: 0 % (ref 0.0–0.2)
Platelets: 384 10*3/uL (ref 150–400)
RBC: 5.12 MIL/uL — ABNORMAL HIGH (ref 3.87–5.11)
RDW: 12.3 % (ref 11.5–15.5)
WBC: 10 10*3/uL (ref 4.0–10.5)

## 2018-09-21 LAB — CBG MONITORING, ED: Glucose-Capillary: 142 mg/dL — ABNORMAL HIGH (ref 70–99)

## 2018-09-21 LAB — I-STAT BETA HCG BLOOD, ED (MC, WL, AP ONLY): I-stat hCG, quantitative: <5 m[IU]/mL (ref <5)

## 2018-09-21 MED ORDER — PROMETHAZINE HCL 25 MG/ML IJ SOLN
25.0000 mg | Freq: Once | INTRAMUSCULAR | Status: DC
  Filled 2018-09-21: qty 1

## 2018-09-21 MED ORDER — DIPHENHYDRAMINE HCL 50 MG/ML IJ SOLN
25.0000 mg | Freq: Once | INTRAMUSCULAR | Status: AC
  Administered 2018-09-21: 25 mg via INTRAVENOUS
  Filled 2018-09-21: qty 1

## 2018-09-21 MED ORDER — SODIUM CHLORIDE 0.9 % IV BOLUS
1000.0000 mL | Freq: Once | INTRAVENOUS | Status: AC
  Administered 2018-09-21: 1000 mL via INTRAVENOUS

## 2018-09-21 MED ORDER — PROCHLORPERAZINE EDISYLATE 10 MG/2ML IJ SOLN
10.0000 mg | Freq: Once | INTRAMUSCULAR | Status: AC
  Administered 2018-09-21: 10 mg via INTRAVENOUS
  Filled 2018-09-21: qty 2

## 2018-09-21 MED ORDER — METOCLOPRAMIDE HCL 5 MG/ML IJ SOLN
10.0000 mg | Freq: Once | INTRAMUSCULAR | Status: AC
  Administered 2018-09-21: 10 mg via INTRAVENOUS
  Filled 2018-09-21: qty 2

## 2018-09-21 MED ORDER — KETOROLAC TROMETHAMINE 15 MG/ML IJ SOLN
15.0000 mg | Freq: Once | INTRAMUSCULAR | Status: AC
  Administered 2018-09-21: 15 mg via INTRAVENOUS
  Filled 2018-09-21: qty 1

## 2018-09-21 NOTE — ED Provider Notes (Signed)
[facility] EMERGENCY DEPARTMENT Provider Note   CSN: [id number] Arrival date & time: 09/21/18  1311     History   Chief Complaint Chief Complaint  Patient presents with  . Altered Mental Status    HPI Michelle Gibson is a 42 y.o. female.  This is a 42 year old female with history of diabetes, asthma and Wolff-Parkinson-White syndrome presenting with new onset confusion, word finding difficulty and headache.  She started having headache on Saturday and she reports that is comes and goes.  Sister is present who helps provide most of the information.  She reports that for a few months her sugars have been elevated up to around 300.,  She was just started on insulin a few days ago as well as gabapentin.  This morning around 5 AM sister noted that the patient started having slurred speech, her sugars at that time were 325 was treated.  She took to see Monday primary care doctor to evaluate patient's knee while there patient was having a lot of difficulty expressing herself and became very frustrated.  She was brought here for evaluation of this neurological change.  She denies any signs of infection such as fevers, chills, appetite changes, diarrhea, constipation, abdominal pain, dysuria or other issues.     Past Medical History:  Diagnosis Date  . Asthma   . Diabetes mellitus without complication (HCC)    has had high blood sugars in 300s for months now  . WPW (Wolff-Parkinson-White syndrome)     Patient Active Problem List   Diagnosis Date Noted  . Asthma with acute exacerbation 09/15/2016  . Hyperglycemia 09/15/2016  . History of Wolff-Parkinson-White (WPW) syndrome 09/15/2016  . Acute respiratory failure with hypoxia ([place]) 09/15/2016  . Bronchospasm 09/11/2016    Past Surgical History:  Procedure Laterality Date  . ABDOMINAL HYSTERECTOMY    . CARDIAC SURGERY    . CESAREAN SECTION       OB History   No obstetric history on file.      Home  Medications    Prior to Admission medications   Medication Sig Start Date End Date Taking? Authorizing Provider  acetaminophen (TYLENOL) 325 MG tablet Take 2 tablets (650 mg total) by mouth every 6 (six) hours as needed for fever. Patient not taking: Reported on 08/23/2018 11/14/17   Alfonse Spruce, FNP  albuterol (PROVENTIL HFA;VENTOLIN HFA) 108 (90 Base) MCG/ACT inhaler Inhale 2 puffs into the lungs every 4 (four) hours as needed for wheezing or shortness of breath. 10/17/17   Alfonse Spruce, FNP  Blood Glucose Monitoring Suppl (TRUE METRIX AIR GLUCOSE METER) w/Device KIT 1 kit by Does not apply route 2 (two) times daily. 09/19/18   Fulp, Cammie, MD  clindamycin (CLEOCIN) 300 MG capsule Take 1 capsule (300 mg total) by mouth 2 (two) times daily. 08/23/18   Fulp, Cammie, MD  gabapentin (NEURONTIN) 100 MG capsule Take 1 or 2 pills at bedtime as needed for burning in feet 09/19/18   Fulp, Cammie, MD  glucose blood (TRUE METRIX BLOOD GLUCOSE TEST) test strip Use as instructed to check blood sugars twice daily and as needed 09/19/18   Fulp, Cammie, MD  ibuprofen (ADVIL,MOTRIN) 800 MG tablet Take 1 tablet (800 mg total) by mouth 3 (three) times daily. X 7 days then prn pain Eat prior to taking this medication 07/20/18   Freeman Caldron M, PA-C  Insulin Glargine (LANTUS SOLOSTAR) 100 UNIT/ML Solostar Pen Inject 5 Units into the skin daily. To lower blood sugar  09/19/18   Fulp, Cammie, MD  ipratropium-albuterol (DUONEB) 0.5-2.5 (3) MG/3ML SOLN Take 3 mLs by nebulization every 6 (six) hours as needed (wheezing or shortness of breath). 11/14/17   Alfonse Spruce, FNP  meclizine (ANTIVERT) 25 MG tablet Take 1 tablet (25 mg total) by mouth 3 (three) times daily as needed. 12/07/17   Brayton Caves, PA-C  metFORMIN (GLUCOPHAGE) 500 MG tablet Take 1 tablet (500 mg total) by mouth 2 (two) times daily with a meal. 08/23/18   Fulp, Cammie, MD  methocarbamol (ROBAXIN) 500 MG tablet Take 2 tablets (1,000  mg total) by mouth 3 (three) times daily. X 1 week then prn pain 07/20/18   Freeman Caldron M, PA-C  mometasone-formoterol (DULERA) 200-5 MCG/ACT AERO Inhale 2 puffs into the lungs 2 (two) times daily. 10/17/17   Alfonse Spruce, FNP  ondansetron (ZOFRAN) 4 MG tablet Take 1 tablet (4 mg total) by mouth every 8 (eight) hours as needed for nausea or vomiting. 07/20/18   Argentina Donovan, PA-C  predniSONE (DELTASONE) 20 MG tablet Take 1 tablet (20 mg total) by mouth daily with breakfast. One pill today then 1/2 pill daily for 4 days 08/23/18   Antony Blackbird, MD  TRUEPLUS LANCETS 28G MISC Use twice daily when checking blood sugars and as needed 09/19/18   Antony Blackbird, MD    Family History No family history on file.  Social History Social History   Tobacco Use  . Smoking status: Current Every Day Smoker    Packs/day: 0.10    Types: Cigarettes  . Smokeless tobacco: Never Used  Substance Use Topics  . Alcohol use: No  . Drug use: No     Allergies   Ciprofloxacin; Flecainide; and Penicillin g   Review of Systems Review of Systems  Constitutional: Negative for appetite change, diaphoresis, fatigue and fever.  Respiratory: Negative for cough and wheezing.   Cardiovascular: Negative for chest pain and palpitations.  Gastrointestinal: Negative for abdominal pain, constipation, diarrhea, nausea and vomiting.  Neurological: Positive for speech difficulty and headaches. Negative for dizziness, tremors, syncope, weakness and light-headedness.     Physical Exam Updated Vital Signs BP 101/63   Pulse 87   Temp 98.8 F (37.1 C) (Oral)   Resp 12   Ht _0  (1.676 m)   Wt 89 kg   SpO2 98%   BMI 31.67 kg/m   Physical Exam Constitutional:      General: She is not in acute distress.    Appearance: Normal appearance.  HENT:     Head: Normocephalic and atraumatic.     Mouth/Throat:     Mouth: Mucous membranes are moist.  Eyes:     Extraocular Movements: Extraocular movements  intact.     Pupils: Pupils are equal, round, and reactive to light.  Neck:     Musculoskeletal: Normal range of motion and neck supple.  Cardiovascular:     Rate and Rhythm: Normal rate and regular rhythm.  Pulmonary:     Effort: Pulmonary effort is normal. No respiratory distress.     Breath sounds: Normal breath sounds.  Abdominal:     General: Abdomen is flat. Bowel sounds are normal.     Palpations: Abdomen is soft.  Musculoskeletal: Normal range of motion.  Skin:    General: Skin is warm and dry.     Capillary Refill: Capillary refill takes less than 2 seconds.  Neurological:     Mental Status: She is alert.     Sensory: Sensation  is intact.     Motor: Motor function is intact.     Deep Tendon Reflexes:     Reflex Scores:      Bicep reflexes are 2+ on the right side and 2+ on the left side.      Patellar reflexes are 2+ on the right side and 2+ on the left side.    Comments: Oriented x2 to person and time, not to place.  Strength: 5/5 in upper and lower extremity.  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  Expressive aphasia.  Psychiatric:        Mood and Affect: Mood normal.        Judgment: Judgment normal.      ED Treatments / Results  Labs (all labs ordered are listed, but only abnormal results are displayed) Labs Reviewed  COMPREHENSIVE METABOLIC PANEL - Abnormal; Notable for the following components:      Result Value   Glucose, Bld 157 (*)    Total Protein 8.3 (*)    All other components within normal limits  CBC - Abnormal; Notable for the following components:   RBC 5.12 (*)    HCT 46.1 (*)    All other components within normal limits  CBG MONITORING, ED - Abnormal; Notable for the following components:   Glucose-Capillary 142 (*)    All other components within normal limits  CBG MONITORING, ED  I-STAT BETA HCG BLOOD, ED ([facility], [facility], [facility] ONLY)     EKG EKG Interpretation  Date/Time:  Thursday September 21 2018 13:20:39 EST Ventricular Rate:  103 PR Interval:    QRS Duration: 81 QT Interval:  410 QTC Calculation: 537 R Axis:   32 Text Interpretation:  Sinus tachycardia Low voltage, precordial leads Borderline repolarization abnormality Prolonged QT interval Baseline wander in lead(s) I II III aVL aVF V3 V4 V5 V6 Confirmed by Quintella Reichert [phone]) on 09/21/2018 1:30:19 PM   Radiology Ct Head Wo Contrast  Result Date: 09/21/2018 CLINICAL DATA:  Altered level of consciousness. EXAM: CT HEAD WITHOUT CONTRAST TECHNIQUE: Contiguous axial images were obtained from the base of the skull through the vertex without intravenous contrast. COMPARISON:  None. FINDINGS: Brain: No evidence of acute infarction, hemorrhage, hydrocephalus, extra-axial collection or mass lesion/mass effect. Vascular: No hyperdense vessel or unexpected calcification. Skull: Normal. Negative for fracture or focal lesion. Sinuses/Orbits: No acute finding. Other: None. IMPRESSION: Normal head CT. Electronically Signed   By: Marijo Conception, M.D.   On: 09/21/2018 14:32    Procedures Procedures (including critical care time)  Medications Ordered in ED Medications  metoCLOPramide (REGLAN) injection 10 mg (10 mg Intravenous Given 09/21/18 1518)  diphenhydrAMINE (BENADRYL) injection 25 mg (25 mg Intravenous Given 09/21/18 1541)     Initial Impression / Assessment and Plan / ED Course  I have reviewed the triage vital signs and the nursing notes.  Pertinent labs & imaging results that were available during my care of the patient were reviewed by me and considered in my medical decision making (see chart for details).     This is a 42 year old female with history of asthma, diabetes, and Wolff-Parkinson-White syndrome presenting after developing expressive aphasia earlier this morning around 5 AM.  Is also been associated with headaches that started on Sunday. They  also noted elevated blood sugar levels.  On exam her neurological exam was intact except for the expressive aphasia.  Her EKG showed NSR. Given the symptoms there is a concern about stroke in the left MCA vs hyperglycemia vs complicated migraine vs new onset of MS s encephalopathy. CBG was 142, CBC was unremarkable, and CMP as unremarkable. CT head showed no acute findings. Patient was given reglan.   Neurology was consulted. They recommended getting an MRI and treating with IV phenergan 25 mg. MRI ordered and signed out to evening team.   Final Clinical Impressions(s) / ED Diagnoses   Final diagnoses:  None    ED Discharge Orders    None       Asencion Noble, MD 09/22/18 0867    Quintella Reichert, MD 09/22/18 1056

## 2018-09-21 NOTE — ED Notes (Signed)
Patient transported to MRI 

## 2018-09-21 NOTE — Telephone Encounter (Signed)
Called sister to inform her to proceed to nearest ED.  Pt sister states she was with her presently, in the ED.

## 2018-09-21 NOTE — ED Notes (Signed)
MD at bedside. 

## 2018-09-21 NOTE — Progress Notes (Signed)
Attempted MRI @1715 . Bladder stimulator put in 4 years ago in [place], no information on make and model. Informed pt nurse she agrees to inform the ordering provider.

## 2018-09-21 NOTE — ED Triage Notes (Signed)
Patient to ED with c/o confusion, speech changes, and generalized weakness with headache since Saturday (LKW Saturday). Patient reports headache began Saturday and has not gone away. She has some expressive aphasia and is slow to answer questions, however is A&O x 4 when given enough time. Denies vision changes. No extremity weakness, facial droop, or slurred speech. PERRL.

## 2018-09-21 NOTE — ED Provider Notes (Signed)
  Physical Exam  BP 112/74   Pulse 81   Temp 98.8 F (37.1 C) (Oral)   Resp 13   Ht 5\' 6"  (1.676 m)   Wt 89 kg   SpO2 97%   BMI 31.67 kg/m   Physical Exam Vitals signs and nursing note reviewed.  Constitutional:      General: She is not in acute distress.    Appearance: She is well-developed.  HENT:     Head: Normocephalic and atraumatic.  Eyes:     Conjunctiva/sclera: Conjunctivae normal.  Neck:     Musculoskeletal: Neck supple.  Cardiovascular:     Rate and Rhythm: Normal rate and regular rhythm.     Heart sounds: No murmur.  Pulmonary:     Effort: Pulmonary effort is normal. No respiratory distress.     Breath sounds: Normal breath sounds.  Abdominal:     Palpations: Abdomen is soft.     Tenderness: There is no abdominal tenderness.  Skin:    General: Skin is warm and dry.  Neurological:     Mental Status: She is alert.     GCS: GCS eye subscore is 4. GCS verbal subscore is 5. GCS motor subscore is 6.     Cranial Nerves: Cranial nerves are intact.     Motor: Motor function is intact.     ED Course/Procedures     Procedures  MDM  Patient was reassessed, patient has resolved.  Patient is speaking clearly without difficulty.  Patient states that she has a mild headache that has become mildly worse. Patient was given IV fluids, Reglan, Benadryl, Toradol.  Likely secondary to complex migraine.  Patient cannot get an MRI as she has metal in her body.  Patient was counseled to follow-up with outpatient neurology for further evaluation and management.  All questions answered.  Patient safe for discharge.      Erskine Squibb, MD 09/21/18 Felicita Gage    Duffy Bruce, MD 09/26/18 [phone]

## 2018-09-21 NOTE — Telephone Encounter (Signed)
Patient's sister called to state that the patient has had a very very bad memory loss. Patient sister states that patient can't recall recent events or this that have been said to her. Patient sister says that she could not call to report what was going on because she cant form her thoughts. Patient's sister states that patient's sugars have been in the 300 range.   Patient's sister was advised to bring patient to the ED or UC if patient need's to seek care sooner than when someone is able to follow up.

## 2018-09-21 NOTE — ED Notes (Signed)
MRI called - patient has bladder stimulator that is not compatible with MRI. MD aware.

## 2018-09-23 ENCOUNTER — Other Ambulatory Visit: Payer: Self-pay

## 2018-09-23 ENCOUNTER — Encounter (HOSPITAL_COMMUNITY): Payer: Self-pay

## 2018-09-23 ENCOUNTER — Emergency Department (HOSPITAL_COMMUNITY)
Admission: EM | Admit: 2018-09-23 | Discharge: 2018-09-23 | Disposition: A | Discharge status: Home / Self Care | Payer: Medicaid Other | Attending: Emergency Medicine | Admitting: Emergency Medicine

## 2018-09-23 DIAGNOSIS — Z794 Long term (current) use of insulin: Secondary | ICD-10-CM | POA: Insufficient documentation

## 2018-09-23 DIAGNOSIS — Z79899 Other long term (current) drug therapy: Secondary | ICD-10-CM | POA: Insufficient documentation

## 2018-09-23 DIAGNOSIS — F17210 Nicotine dependence, cigarettes, uncomplicated: Secondary | ICD-10-CM | POA: Insufficient documentation

## 2018-09-23 DIAGNOSIS — R739 Hyperglycemia, unspecified: Secondary | ICD-10-CM

## 2018-09-23 DIAGNOSIS — E1165 Type 2 diabetes mellitus with hyperglycemia: Secondary | ICD-10-CM | POA: Insufficient documentation

## 2018-09-23 LAB — BASIC METABOLIC PANEL
Anion gap: 9 (ref 5–15)
BUN: 21 mg/dL — ABNORMAL HIGH (ref 6–20)
CO2: 24 mmol/L (ref 22–32)
Calcium: 9.1 mg/dL (ref 8.9–10.3)
Chloride: 106 mmol/L (ref 98–111)
Creatinine, Ser: 0.91 mg/dL (ref 0.44–1.00)
GFR calc Af Amer: >60 mL/min (ref >60)
GFR calc non Af Amer: >60 mL/min (ref >60)
Glucose, Bld: 320 mg/dL — ABNORMAL HIGH (ref 70–99)
Potassium: 4.3 mmol/L (ref 3.5–5.1)
Sodium: 139 mmol/L (ref 135–145)

## 2018-09-23 LAB — CBC WITH DIFFERENTIAL/PLATELET
Abs Immature Granulocytes: 0.07 10*3/uL (ref 0.00–0.07)
Basophils Absolute: 0 10*3/uL (ref 0.0–0.1)
Basophils Relative: 0 %
Eosinophils Absolute: 0 10*3/uL (ref 0.0–0.5)
Eosinophils Relative: 0 %
HCT: 38.9 % (ref 36.0–46.0)
Hemoglobin: 12.8 g/dL (ref 12.0–15.0)
Immature Granulocytes: 1 %
Lymphocytes Relative: 24 %
Lymphs Abs: 2.4 10*3/uL (ref 0.7–4.0)
MCH: 29.7 pg (ref 26.0–34.0)
MCHC: 32.9 g/dL (ref 30.0–36.0)
MCV: 90.3 fL (ref 80.0–100.0)
Monocytes Absolute: 0.5 10*3/uL (ref 0.1–1.0)
Monocytes Relative: 4 %
Neutro Abs: 7.2 10*3/uL (ref 1.7–7.7)
Neutrophils Relative %: 71 %
Platelets: 371 10*3/uL (ref 150–400)
RBC: 4.31 MIL/uL (ref 3.87–5.11)
RDW: 12.6 % (ref 11.5–15.5)
WBC: 10.2 10*3/uL (ref 4.0–10.5)
nRBC: 0 % (ref 0.0–0.2)

## 2018-09-23 LAB — CBG MONITORING, ED
GLUCOSE-CAPILLARY: 335 mg/dL — AB (ref 70–99)
Glucose-Capillary: 266 mg/dL — ABNORMAL HIGH (ref 70–99)
Glucose-Capillary: 257 mg/dL — ABNORMAL HIGH (ref 70–99)
Glucose-Capillary: 276 mg/dL — ABNORMAL HIGH (ref 70–99)
Glucose-Capillary: 97 mg/dL (ref 70–99)

## 2018-09-23 LAB — BLOOD GAS, VENOUS
Acid-base deficit: 0.2 mmol/L (ref 0.0–2.0)
Bicarbonate: 24.6 mmol/L (ref 20.0–28.0)
O2 Saturation: 88.9 %
Patient temperature: 98.6
pCO2, Ven: 42.9 mmHg — ABNORMAL LOW (ref 44.0–60.0)
pH, Ven: 7.377 (ref 7.250–7.430)
pO2, Ven: 56.9 mmHg — ABNORMAL HIGH (ref 32.0–45.0)

## 2018-09-23 MED ORDER — SODIUM CHLORIDE 0.9 % IV BOLUS
1000.0000 mL | Freq: Once | INTRAVENOUS | Status: AC
  Administered 2018-09-23: 1000 mL via INTRAVENOUS

## 2018-09-23 MED ORDER — KETOROLAC TROMETHAMINE 30 MG/ML IJ SOLN
30.0000 mg | Freq: Once | INTRAMUSCULAR | Status: AC
  Administered 2018-09-23: 30 mg via INTRAVENOUS
  Filled 2018-09-23: qty 1

## 2018-09-23 MED ORDER — METOCLOPRAMIDE HCL 5 MG/ML IJ SOLN
10.0000 mg | Freq: Once | INTRAMUSCULAR | Status: AC
  Administered 2018-09-23: 10 mg via INTRAVENOUS
  Filled 2018-09-23: qty 2

## 2018-09-23 NOTE — ED Notes (Signed)
Bed: QI16 Expected date:  Expected time:  Means of arrival:  Comments: EMS 42 yo female hyperglycemia-CBG 420-recently Rx with Lantus-3 days ago

## 2018-09-23 NOTE — ED Notes (Signed)
Bed: WA17 Expected date:  Expected time:  Means of arrival:  Comments: Room 5

## 2018-09-23 NOTE — Discharge Instructions (Addendum)
Please call your doctor on Monday morning.  Keep your insulin at the same dose for right now.  Make sure to stay well-hydrated.  Please return emergency department he develop any new or worsening symptoms.

## 2018-09-23 NOTE — ED Triage Notes (Addendum)
PT BIB EMS FROM HOME C/O HIGH BLOOD SUGAR. PT C/O HEADACHE, EXCESSIVE THIRST, AND FREQUENT URINATION X3 DAYS. NS 500ML BOLUS AND ZOFRAN 4MG  IV GIVEN PTA.

## 2018-09-24 NOTE — ED Provider Notes (Signed)
[facility] DEPT Provider Note   CSN: [id number] Arrival date & time: 09/23/18  0116     History   Chief Complaint Chief Complaint  Patient presents with  . Hyperglycemia    HPI Michelle Gibson is a 42 y.o. female with history of WPW, type 2 diabetes, asthma who presents with hyperglycemia.  Patient recently started Lantus 5 units 3 days ago.  She has had persistent high blood sugars of over 300 and 400.  She has been having excessive thirst, polyuria.  She has had a headache.  She denies any abdominal pain, nausea, vomiting, chest pain, shortness of breath.  HPI  Past Medical History:  Diagnosis Date  . Asthma   . Diabetes mellitus without complication ([place])    TYPE II  . WPW (Wolff-Parkinson-White syndrome)     Patient Active Problem List   Diagnosis Date Noted  . Asthma with acute exacerbation 09/15/2016  . Hyperglycemia 09/15/2016  . History of Wolff-Parkinson-White (WPW) syndrome 09/15/2016  . Acute respiratory failure with hypoxia ([place]) 09/15/2016  . Bronchospasm 09/11/2016    Past Surgical History:  Procedure Laterality Date  . ABDOMINAL HYSTERECTOMY    . CARDIAC SURGERY    . CESAREAN SECTION       OB History   No obstetric history on file.      Home Medications    Prior to Admission medications   Medication Sig Start Date End Date Taking? Authorizing Provider  albuterol (PROVENTIL HFA;VENTOLIN HFA) 108 (90 Base) MCG/ACT inhaler Inhale 2 puffs into the lungs every 4 (four) hours as needed for wheezing or shortness of breath. 10/17/17  Yes Alfonse Spruce, FNP  gabapentin (NEURONTIN) 100 MG capsule Take 1 or 2 pills at bedtime as needed for burning in feet 09/19/18  Yes Fulp, Cammie, MD  ibuprofen (ADVIL,MOTRIN) 800 MG tablet Take 1 tablet (800 mg total) by mouth 3 (three) times daily. X 7 days then prn pain Eat prior to taking this medication Patient taking differently: Take 800 mg by mouth 3 (three) times daily as needed  (for pain).  07/20/18  Yes McClung, Angela M, PA-C  Insulin Glargine (LANTUS SOLOSTAR) 100 UNIT/ML Solostar Pen Inject 5 Units into the skin daily. To lower blood sugar Patient taking differently: Inject 5 Units into the skin daily at 6 PM.  09/19/18  Yes Fulp, Cammie, MD  ipratropium-albuterol (DUONEB) 0.5-2.5 (3) MG/3ML SOLN Take 3 mLs by nebulization every 6 (six) hours as needed (wheezing or shortness of breath). 11/14/17  Yes Alfonse Spruce, FNP  metFORMIN (GLUCOPHAGE) 500 MG tablet Take 1 tablet (500 mg total) by mouth 2 (two) times daily with a meal. 08/23/18  Yes Fulp, Cammie, MD  methocarbamol (ROBAXIN) 500 MG tablet Take 2 tablets (1,000 mg total) by mouth 3 (three) times daily. X 1 week then prn pain Patient taking differently: Take 1,000 mg by mouth 3 (three) times daily as needed for muscle spasms.  07/20/18  Yes McClung, Angela M, PA-C  mometasone-formoterol (DULERA) 200-5 MCG/ACT AERO Inhale 2 puffs into the lungs 2 (two) times daily. 10/17/17  Yes Hairston, Mandesia R, FNP  ondansetron (ZOFRAN) 4 MG tablet Take 1 tablet (4 mg total) by mouth every 8 (eight) hours as needed for nausea or vomiting. 07/20/18  Yes Freeman Caldron M, PA-C  acetaminophen (TYLENOL) 325 MG tablet Take 2 tablets (650 mg total) by mouth every 6 (six) hours as needed for fever. Patient not taking: Reported on 09/21/2018 11/14/17   Alfonse Spruce, FNP  Blood Glucose Monitoring Suppl (TRUE METRIX AIR GLUCOSE METER) w/Device KIT 1 kit by Does not apply route 2 (two) times daily. 09/19/18   Fulp, Cammie, MD  clindamycin (CLEOCIN) 300 MG capsule Take 1 capsule (300 mg total) by mouth 2 (two) times daily. Patient not taking: Reported on 09/21/2018 08/23/18   Fulp, Ander Gaster, MD  glucose blood (TRUE METRIX BLOOD GLUCOSE TEST) test strip Use as instructed to check blood sugars twice daily and as needed 09/19/18   Fulp, Cammie, MD  predniSONE (DELTASONE) 20 MG tablet Take 1 tablet (20 mg total) by mouth daily with  breakfast. One pill today then 1/2 pill daily for 4 days Patient not taking: Reported on 09/21/2018 08/23/18   Antony Blackbird, MD  TRUEPLUS LANCETS 28G MISC Use twice daily when checking blood sugars and as needed 09/19/18   Antony Blackbird, MD    Family History History reviewed. No pertinent family history.  Social History Social History   Tobacco Use  . Smoking status: Current Every Day Smoker    Packs/day: 0.10    Types: Cigarettes  . Smokeless tobacco: Never Used  Substance Use Topics  . Alcohol use: No  . Drug use: No     Allergies   Ciprofloxacin; Flecainide; and Penicillin g   Review of Systems Review of Systems  Constitutional: Negative for chills and fever.  HENT: Negative for facial swelling and sore throat.   Respiratory: Negative for shortness of breath.   Cardiovascular: Negative for chest pain.  Gastrointestinal: Negative for abdominal pain, nausea and vomiting.  Endocrine: Positive for polydipsia and polyuria.  Genitourinary: Negative for dysuria.  Musculoskeletal: Negative for back pain.  Skin: Negative for rash and wound.  Neurological: Positive for headaches.  Psychiatric/Behavioral: The patient is not nervous/anxious.      Physical Exam Updated Vital Signs BP 119/76 (BP Location: Left Arm)   Pulse 82   Temp 98.7 F (37.1 C) (Oral)   Resp 20   Ht _0  (1.676 m)   Wt 89.8 kg   SpO2 96%   BMI 31.96 kg/m   Physical Exam Vitals signs and nursing note reviewed.  Constitutional:      General: She is not in acute distress.    Appearance: She is well-developed. She is not diaphoretic.  HENT:     Head: Normocephalic and atraumatic.     Mouth/Throat:     Pharynx: No oropharyngeal exudate.  Eyes:     General: No scleral icterus.       Right eye: No discharge.        Left eye: No discharge.     Conjunctiva/sclera: Conjunctivae normal.     Pupils: Pupils are equal, round, and reactive to light.  Neck:     Musculoskeletal: Normal range of motion  and neck supple.     Thyroid: No thyromegaly.  Cardiovascular:     Rate and Rhythm: Normal rate and regular rhythm.     Heart sounds: Normal heart sounds. No murmur. No friction rub. No gallop.   Pulmonary:     Effort: Pulmonary effort is normal. No respiratory distress.     Breath sounds: Normal breath sounds. No stridor. No wheezing or rales.  Abdominal:     General: Bowel sounds are normal. There is no distension.     Palpations: Abdomen is soft.     Tenderness: There is no abdominal tenderness. There is no guarding or rebound.  Lymphadenopathy:     Cervical: No cervical adenopathy.  Skin:  General: Skin is warm and dry.     Coloration: Skin is not pale.     Findings: No rash.  Neurological:     Mental Status: She is alert.     Coordination: Coordination normal.     Comments: CN 3-12 intact; normal sensation throughout; 5/5 strength in all 4 extremities; equal bilateral grip strength      ED Treatments / Results  Labs (all labs ordered are listed, but only abnormal results are displayed) Labs Reviewed  BASIC METABOLIC PANEL - Abnormal; Notable for the following components:      Result Value   Glucose, Bld 320 (*)    BUN 21 (*)    All other components within normal limits  BLOOD GAS, VENOUS - Abnormal; Notable for the following components:   pCO2, Ven 42.9 (*)    pO2, Ven 56.9 (*)    All other components within normal limits  CBG MONITORING, ED - Abnormal; Notable for the following components:   Glucose-Capillary 335 (*)    All other components within normal limits  CBG MONITORING, ED - Abnormal; Notable for the following components:   Glucose-Capillary 266 (*)    All other components within normal limits  CBG MONITORING, ED - Abnormal; Notable for the following components:   Glucose-Capillary 276 (*)    All other components within normal limits  CBG MONITORING, ED - Abnormal; Notable for the following components:   Glucose-Capillary 257 (*)    All other  components within normal limits  CBC WITH DIFFERENTIAL/PLATELET  CBG MONITORING, ED    EKG None  Radiology No results found.  Procedures Procedures (including critical care time)  Medications Ordered in ED Medications  sodium chloride 0.9 % bolus 1,000 mL (0 mLs Intravenous Stopped 09/23/18 0514)  metoCLOPramide (REGLAN) injection 10 mg (10 mg Intravenous Given 09/23/18 0314)  ketorolac (TORADOL) 30 MG/ML injection 30 mg (30 mg Intravenous Given 09/23/18 0314)     Initial Impression / Assessment and Plan / ED Course  I have reviewed the triage vital signs and the nursing notes.  Pertinent labs & imaging results that were available during my care of the patient were reviewed by me and considered in my medical decision making (see chart for details).     Patient presenting with hyperglycemia.  It was reduced in the ED with fluids.  Patient's headache is feeling better after Toradol and Reglan.  Normal neuro exam, no focal deficits.  Patient was evaluated for migraine headache 3 days ago and had negative head CT.  Patient's labs are stable and there are no signs of diabetic emergency.  Patient is to follow-up with her PCP for adjustments of her insulin.  Per chart review, patient was told to call her PCP if the insulin was not controlling her glucose.  Patient has an appointment with clinical pharmacy regarding her insulin this week.  Patient understands and agrees with plan.  Patient vital stable throughout ED course and discharged in satisfactory condition. I discussed patient case with Dr. Kathrynn Humble who guided the patient's management and agrees with plan.  Final Clinical Impressions(s) / ED Diagnoses   Final diagnoses:  Hyperglycemia    ED Discharge Orders    None       Frederica Kuster, PA-C 09/24/18 Vito Berger, MD 09/24/18 [phone]

## 2018-10-02 ENCOUNTER — Ambulatory Visit: Payer: Self-pay | Admitting: Pharmacist

## 2018-10-12 ENCOUNTER — Ambulatory Visit: Payer: Self-pay | Admitting: Pharmacist

## 2018-10-16 ENCOUNTER — Ambulatory Visit: Payer: Self-pay | Admitting: Family Medicine

## 2018-10-25 ENCOUNTER — Telehealth: Payer: Self-pay | Admitting: Family Medicine

## 2018-10-25 NOTE — Telephone Encounter (Signed)
Patient called requesting -rheumatoid arthritis labwork  She says her nurse was supposed to call her back and have this scheduled, no labwork was active in her chart.please follow up

## 2018-10-25 NOTE — Telephone Encounter (Signed)
Patient verified DOB Patient states she is requesting the blood work. Patient was scheduled for Follow up with CPP for 10/02/18 and 10/12/18 which were cancelled. Patient also cancelled follow up with PCP on 10/16/18. Patient needs to reschedule and can obtain blood work at that next visit. Transferred to front office for scheduling.

## 2018-11-17 ENCOUNTER — Telehealth: Payer: Self-pay | Admitting: Family Medicine

## 2018-11-17 ENCOUNTER — Other Ambulatory Visit: Payer: Self-pay

## 2018-11-17 ENCOUNTER — Encounter: Payer: Self-pay | Admitting: Primary Care

## 2018-11-17 ENCOUNTER — Ambulatory Visit: Payer: Self-pay | Attending: Primary Care | Admitting: Primary Care

## 2018-11-17 VITALS — BP 124/90 | HR 91 | Temp 99.2°F | Resp 20

## 2018-11-17 DIAGNOSIS — J9601 Acute respiratory failure with hypoxia: Secondary | ICD-10-CM | POA: Insufficient documentation

## 2018-11-17 DIAGNOSIS — Z881 Allergy status to other antibiotic agents status: Secondary | ICD-10-CM | POA: Insufficient documentation

## 2018-11-17 DIAGNOSIS — Z880 Allergy status to penicillin: Secondary | ICD-10-CM | POA: Insufficient documentation

## 2018-11-17 DIAGNOSIS — J029 Acute pharyngitis, unspecified: Secondary | ICD-10-CM | POA: Insufficient documentation

## 2018-11-17 DIAGNOSIS — Z888 Allergy status to other drugs, medicaments and biological substances status: Secondary | ICD-10-CM | POA: Insufficient documentation

## 2018-11-17 DIAGNOSIS — R509 Fever, unspecified: Secondary | ICD-10-CM | POA: Insufficient documentation

## 2018-11-17 DIAGNOSIS — Z79899 Other long term (current) drug therapy: Secondary | ICD-10-CM | POA: Insufficient documentation

## 2018-11-17 DIAGNOSIS — Z794 Long term (current) use of insulin: Secondary | ICD-10-CM | POA: Insufficient documentation

## 2018-11-17 DIAGNOSIS — E1165 Type 2 diabetes mellitus with hyperglycemia: Secondary | ICD-10-CM | POA: Insufficient documentation

## 2018-11-17 DIAGNOSIS — Z87891 Personal history of nicotine dependence: Secondary | ICD-10-CM | POA: Insufficient documentation

## 2018-11-17 DIAGNOSIS — J45909 Unspecified asthma, uncomplicated: Secondary | ICD-10-CM | POA: Insufficient documentation

## 2018-11-17 DIAGNOSIS — I456 Pre-excitation syndrome: Secondary | ICD-10-CM | POA: Insufficient documentation

## 2018-11-17 DIAGNOSIS — J4541 Moderate persistent asthma with (acute) exacerbation: Secondary | ICD-10-CM

## 2018-11-17 LAB — POCT INFLUENZA A/B
INFLUENZA A, POC: NEGATIVE
Influenza B, POC: NEGATIVE

## 2018-11-17 LAB — GLUCOSE, POCT (MANUAL RESULT ENTRY): POC GLUCOSE: 134 mg/dL — AB (ref 70–99)

## 2018-11-17 LAB — POCT RAPID STREP A (OFFICE): Rapid Strep A Screen: NEGATIVE

## 2018-11-17 NOTE — Telephone Encounter (Signed)
Patient says she feels like her lungs are on fire and feels chill and has head pressure. Says she has bad bladder spasms.    Patient was advised to go to the ED or UC.

## 2018-11-17 NOTE — Patient Instructions (Addendum)
Fever, Adult     A fever is an increase in your body's temperature. It often means a temperature of 100.4F (38C) or higher. Brief mild or moderate fevers often have no long-term effects. They often do not need treatment. Moderate or high fevers may make you feel uncomfortable. Sometimes, they can be a sign of a serious illness or disease. A fever that keeps coming back or that lasts a long time may cause you to lose water in your body (get dehydrated). You can take your temperature with a thermometer to see if you have a fever. Temperature can change with:  Age.  Time of day.  Where the thermometer is put in the body. Readings may vary when the thermometer is put: ? In the mouth (oral). ? In the butt (rectal). ? In the ear (tympanic). ? Under the arm (axillary). ? On the forehead (temporal). Follow these instructions at home: Medicines  Take over-the-counter and prescription medicines only as told by your doctor. Follow the dosing instructions carefully.  If you were prescribed an antibiotic medicine, take it as told by your doctor. Do not stop taking it even if you start to feel better. General instructions  Watch for any changes in your symptoms. Tell your doctor about them.  Rest as needed.  Drink enough fluid to keep your pee (urine) pale yellow.  Sponge yourself or bathe with room-temperature water as needed. This helps to lower your body temperature. Do not use ice water.  Do not use too many blankets or wear clothes that are too heavy.  If your fever was caused by an infection that spreads from person to person (is contagious), such as a cold or the flu: ? You should stay home from work and public places for at least 24 hours after your fever is gone. ? Your fever should be gone for at least 24 hours without the need to use medicines. Contact a doctor if:  You throw up (vomit).  You cannot eat or drink without throwing up.  You have watery poop (diarrhea).  It  hurts when you pee.  Your symptoms do not get better with treatment.  You have new symptoms.  You feel very weak. Get help right away if:  You are short of breath or have trouble breathing.  You are dizzy or you pass out (faint).  You feel mixed up (confused).  You have signs of not having enough water in your body, such as: ? Dark pee, very little pee, or no pee. ? Cracked lips. ? Dry mouth. ? Sunken eyes. ? Sleepiness. ? Weakness.  You have very bad pain in your belly (abdomen).  You keep throwing up or having watery poop.  You have a rash on your skin.  Your symptoms get worse all of a sudden. Summary  A fever is an increase in your body's temperature. It often means a temperature of 100.4F (38C) or higher.  Watch for any changes in your symptoms. Tell your doctor about them.  Take all medicines only as told by your doctor.  Do not go to work or other public places if your fever was caused by an illness that can spread to other people.  Get help right away if you have signs that you do not have enough water in your body. This information is not intended to replace advice given to you by your health care provider. Make sure you discuss any questions you have with your health care provider. Document Released: 07/06/2008   Document Revised: 03/13/2018 Document Reviewed: 03/13/2018 Elsevier Interactive Patient Education  2019 [facility] upper respiratory infection patient instructions here.

## 2018-11-17 NOTE — Telephone Encounter (Signed)
Pt RETURN call at 11:40 stating she just left appt she had here and received a phone call from here with no vm message.  Please call pt [phone].

## 2018-11-17 NOTE — Telephone Encounter (Signed)
Pt was worked in as a same day apt. Today with Juluis Mire, ANP.

## 2018-11-17 NOTE — Progress Notes (Signed)
Acute Office Visit  Subjective:    Patient ID: Michelle Gibson, female    DOB: [birth year], 43 y.o.   MRN: [id number]  Chief Complaint  Patient presents with  . URI    HPI Patient is in today for chills , fever and sore throat, this started yesterday. Patients states she had a knee injection for pain and later on that day these s/s occurred.  Past Medical History:  Diagnosis Date  . Asthma   . Diabetes mellitus without complication ([place])    TYPE II  . WPW (Wolff-Parkinson-White syndrome)     Past Surgical History:  Procedure Laterality Date  . ABDOMINAL HYSTERECTOMY    . CARDIAC SURGERY    . CESAREAN SECTION      No family history on file.  Social History   Socioeconomic History  . Marital status: Divorced    Spouse name: Not on file  . Number of children: Not on file  . Years of education: Not on file  . Highest education level: Not on file  Occupational History  . Not on file  Social Needs  . Financial resource strain: Not on file  . Food insecurity:    Worry: Not on file    Inability: Not on file  . Transportation needs:    Medical: Not on file    Non-medical: Not on file  Tobacco Use  . Smoking status: Former Smoker    Packs/day: 0.10    Types: Cigarettes  . Smokeless tobacco: Never Used  Substance and Sexual Activity  . Alcohol use: No  . Drug use: No  . Sexual activity: Yes  Lifestyle  . Physical activity:    Days per week: Not on file    Minutes per session: Not on file  . Stress: Not on file  Relationships  . Social connections:    Talks on phone: Not on file    Gets together: Not on file    Attends religious service: Not on file    Active member of club or organization: Not on file    Attends meetings of clubs or organizations: Not on file    Relationship status: Not on file  . Intimate partner violence:    Fear of current or ex partner: Not on file    Emotionally abused: Not on file    Physically abused: Not on file    Forced sexual  activity: Not on file  Other Topics Concern  . Not on file  Social History Narrative  . Not on file    Outpatient Medications Prior to Visit  Medication Sig Dispense Refill  . gabapentin (NEURONTIN) 100 MG capsule Take 1 or 2 pills at bedtime as needed for burning in feet 60 capsule 3  . ibuprofen (ADVIL,MOTRIN) 800 MG tablet Take 1 tablet (800 mg total) by mouth 3 (three) times daily. X 7 days then prn pain Eat prior to taking this medication (Patient taking differently: Take 800 mg by mouth 3 (three) times daily as needed (for pain). ) 90 tablet 0  . Insulin Glargine (LANTUS SOLOSTAR) 100 UNIT/ML Solostar Pen Inject 5 Units into the skin daily. To lower blood sugar (Patient taking differently: Inject 5 Units into the skin daily at 6 PM. ) 5 pen 3  . metFORMIN (GLUCOPHAGE) 500 MG tablet Take 1 tablet (500 mg total) by mouth 2 (two) times daily with a meal. 60 tablet 6  . methocarbamol (ROBAXIN) 500 MG tablet Take 2 tablets (1,000 mg total) by mouth 3 (  three) times daily. X 1 week then prn pain (Patient taking differently: Take 1,000 mg by mouth 3 (three) times daily as needed for muscle spasms. ) 90 tablet 0  . TRUEPLUS LANCETS 28G MISC Use twice daily when checking blood sugars and as needed 100 each 6  . acetaminophen (TYLENOL) 325 MG tablet Take 2 tablets (650 mg total) by mouth every 6 (six) hours as needed for fever. (Patient not taking: Reported on 11/17/2018)    . albuterol (PROVENTIL HFA;VENTOLIN HFA) 108 (90 Base) MCG/ACT inhaler Inhale 2 puffs into the lungs every 4 (four) hours as needed for wheezing or shortness of breath. (Patient not taking: Reported on 11/17/2018) 54 g 3  . Blood Glucose Monitoring Suppl (TRUE METRIX AIR GLUCOSE METER) w/Device KIT 1 kit by Does not apply route 2 (two) times daily. 1 kit 0  . clindamycin (CLEOCIN) 300 MG capsule Take 1 capsule (300 mg total) by mouth 2 (two) times daily. (Patient not taking: Reported on 09/21/2018) 14 capsule 0  . glucose blood (TRUE  METRIX BLOOD GLUCOSE TEST) test strip Use as instructed to check blood sugars twice daily and as needed 100 each 6  . ipratropium-albuterol (DUONEB) 0.5-2.5 (3) MG/3ML SOLN Take 3 mLs by nebulization every 6 (six) hours as needed (wheezing or shortness of breath). (Patient not taking: Reported on 11/17/2018) 240 mL 1  . mometasone-formoterol (DULERA) 200-5 MCG/ACT AERO Inhale 2 puffs into the lungs 2 (two) times daily. (Patient not taking: Reported on 11/17/2018) 39 Inhaler 3  . ondansetron (ZOFRAN) 4 MG tablet Take 1 tablet (4 mg total) by mouth every 8 (eight) hours as needed for nausea or vomiting. 30 tablet 0  . predniSONE (DELTASONE) 20 MG tablet Take 1 tablet (20 mg total) by mouth daily with breakfast. One pill today then 1/2 pill daily for 4 days (Patient not taking: Reported on 09/21/2018) 3 tablet 0   Facility-Administered Medications Prior to Visit  Medication Dose Route Frequency Provider Last Rate Last Dose  . ipratropium-albuterol (DUONEB) 0.5-2.5 (3) MG/3ML nebulizer solution 3 mL  3 mL Nebulization Q20 Min PRN Fredia Beets R, FNP   3 mL at 11/14/17 1615    Allergies  Allergen Reactions  . Ciprofloxacin Hives  . Flecainide Hives, Nausea And Vomiting and Rash  . Penicillin G Rash    Has patient had a PCN reaction causing immediate rash, facial/tongue/throat swelling, SOB or lightheadedness with hypotension: yes Has patient had a PCN reaction causing severe rash involving mucus membranes or skin necrosis: yes Has patient had a PCN reaction that required hospitalization: no Has patient had a PCN reaction occurring within the last 10 years: no If all of the above answers are "NO", then may proceed with Cephalosporin use.     Review of Systems  Constitutional: Positive for chills, fever and malaise/fatigue.  HENT: Positive for sore throat.   Eyes: Negative.   Respiratory: Positive for cough and sputum production.   Cardiovascular: Positive for palpitations.    Gastrointestinal: Negative.   Genitourinary: Negative.   Musculoskeletal: Positive for myalgias.  Skin: Negative.   Neurological: Positive for headaches.  Endo/Heme/Allergies: Negative.   Psychiatric/Behavioral: Negative.        Objective:    Physical Exam  Constitutional: She is oriented to person, place, and time. She appears well-nourished.  HENT:  Head: Normocephalic.  Eyes: Scleral icterus is present.  Neck: Neck supple.  Cardiovascular: Regular rhythm.  Pulmonary/Chest: Breath sounds normal. She is in respiratory distress.  Abdominal: Soft. Bowel sounds are  normal.  Musculoskeletal: Normal range of motion.  Neurological: She is oriented to person, place, and time.  Skin: Skin is warm and dry.  Psychiatric: She has a normal mood and affect.    BP 124/90   Pulse 91   Temp 99.2 F (37.3 C) (Oral)   Resp 20   SpO2 93%  Wt Readings from Last 3 Encounters:  09/23/18 198 lb (89.8 kg)  09/21/18 196 lb 3.4 oz (89 kg)  09/19/18 198 lb (89.8 kg)    Health Maintenance Due  Topic Date Due  . URINE MICROALBUMIN  06/27/1988  . HIV Screening  06/27/1993  . PAP SMEAR-Modifier  06/28/1999    No results found for: TSH Lab Results  Component Value Date   WBC 10.2 09/23/2018   HGB 12.8 09/23/2018   HCT 38.9 09/23/2018   MCV 90.3 09/23/2018   PLT 371 09/23/2018   Lab Results  Component Value Date   NA 139 09/23/2018   K 4.3 09/23/2018   CO2 24 09/23/2018   GLUCOSE 320 (H) 09/23/2018   BUN 21 (H) 09/23/2018   CREATININE 0.91 09/23/2018   BILITOT 0.6 09/21/2018   ALKPHOS 80 09/21/2018   AST 27 09/21/2018   ALT 32 09/21/2018   PROT 8.3 (H) 09/21/2018   ALBUMIN 4.3 09/21/2018   CALCIUM 9.1 09/23/2018   ANIONGAP 9 09/23/2018   No results found for: CHOL No results found for: HDL No results found for: LDLCALC No results found for: TRIG No results found for: CHOLHDL Lab Results  Component Value Date   HGBA1C 6.9 09/19/2018       Assessment & Plan:    Problem List Items Addressed This Visit    Fever and chills X 24hrs       Acute respiratory failure with hypoxia ([place])  Hx of O2 now 92 on Room air     Other Visit Diagnoses    Type 2 diabetes mellitus with hyperglycemia, without long-term current use of insulin ([facility])    -  Primary   Relevant Orders   Glucose (CBG) (Completed)   Sore throat     Will r/o strep back of throat red sore with fine patches , May also, related to boggy nares and drainage r/o Strep

## 2018-11-17 NOTE — Progress Notes (Signed)
Feels like my lungs are on fire

## 2018-11-21 ENCOUNTER — Ambulatory Visit: Payer: Self-pay | Admitting: Family Medicine

## 2018-11-23 LAB — POCT RAPID STREP A (OFFICE): Rapid Strep A Screen: NEGATIVE

## 2018-12-07 ENCOUNTER — Ambulatory Visit: Payer: Self-pay | Admitting: Family Medicine

## 2018-12-15 ENCOUNTER — Telehealth: Payer: Self-pay | Admitting: Family Medicine

## 2018-12-15 NOTE — Telephone Encounter (Signed)
Patient called the clinic with asking advice of her current symptoms. Patient verified by name and DOB.  Patient states she has had a fever above 103 for two (2)days.  Patient endorses a unproductive cough, body aches, diarrhea and headaches.  Patient endorses taking OTC medication without relief.  Patient endorses drinking water but states she has not been eating.   Patient advised to go to the Emergency Room.

## 2018-12-15 NOTE — Telephone Encounter (Signed)
Patient called requesting to speak with the nurse because her fever is 103.5 for the last two days. Patient also states that her throat hurts. Please f/u

## 2018-12-16 ENCOUNTER — Emergency Department (HOSPITAL_COMMUNITY): Payer: Medicaid Other

## 2018-12-16 ENCOUNTER — Other Ambulatory Visit: Payer: Self-pay

## 2018-12-16 ENCOUNTER — Encounter (HOSPITAL_COMMUNITY): Payer: Self-pay | Admitting: Emergency Medicine

## 2018-12-16 ENCOUNTER — Emergency Department (HOSPITAL_COMMUNITY)
Admission: EM | Admit: 2018-12-16 | Discharge: 2018-12-17 | Disposition: A | Discharge status: Home / Self Care | Payer: Medicaid Other | Attending: Emergency Medicine | Admitting: Emergency Medicine

## 2018-12-16 DIAGNOSIS — R0602 Shortness of breath: Secondary | ICD-10-CM | POA: Insufficient documentation

## 2018-12-16 DIAGNOSIS — Z5321 Procedure and treatment not carried out due to patient leaving prior to being seen by health care provider: Secondary | ICD-10-CM | POA: Insufficient documentation

## 2018-12-16 NOTE — ED Triage Notes (Signed)
Pt c/o shortness of breath and fever at home. Hx asthma. Afebrile in triage.

## 2018-12-17 NOTE — ED Notes (Signed)
Pt did not answer. Will be moved OTF.

## 2018-12-17 NOTE — ED Notes (Signed)
Pt called x 3. No answer. Pt will be moved OTF.

## 2019-01-03 ENCOUNTER — Encounter: Payer: Self-pay | Admitting: Family Medicine

## 2019-01-03 ENCOUNTER — Telehealth: Payer: Self-pay | Admitting: Family Medicine

## 2019-01-03 NOTE — Progress Notes (Signed)
Patient ID: Michelle Gibson, female   DOB: 1976-05-09, 43 y.o.   MRN: [id number]   Patient left phone message requesting letter be written so that she can work from home due to current community spread/outbreak of COVID 19.

## 2019-01-03 NOTE — Telephone Encounter (Signed)
Letter has been printed. Please contact patient to ensure correct address so that letter can be mailed

## 2019-01-03 NOTE — Telephone Encounter (Signed)
Patient wants a note for her asthma that she needs to be at home becaues of covid 19.

## 2019-01-10 ENCOUNTER — Encounter: Payer: Self-pay | Admitting: Family Medicine

## 2019-01-10 NOTE — Telephone Encounter (Signed)
Please advise patient via mychart if possible.

## 2019-01-17 ENCOUNTER — Other Ambulatory Visit: Payer: Self-pay | Admitting: Physician Assistant

## 2019-01-17 ENCOUNTER — Other Ambulatory Visit: Payer: Self-pay | Admitting: Family Medicine

## 2019-01-17 DIAGNOSIS — M5441 Lumbago with sciatica, right side: Principal | ICD-10-CM

## 2019-01-17 DIAGNOSIS — R51 Headache: Secondary | ICD-10-CM

## 2019-01-17 DIAGNOSIS — J029 Acute pharyngitis, unspecified: Secondary | ICD-10-CM

## 2019-01-17 DIAGNOSIS — R519 Headache, unspecified: Secondary | ICD-10-CM

## 2019-01-17 DIAGNOSIS — G8929 Other chronic pain: Secondary | ICD-10-CM

## 2019-01-17 NOTE — Telephone Encounter (Signed)
Refill request along with prednisone for sore throat. Please advise patient.

## 2019-01-17 NOTE — Telephone Encounter (Signed)
Please advise patient due to concern getting worse.

## 2019-01-17 NOTE — Telephone Encounter (Signed)
Refill request

## 2019-01-19 MED ORDER — IBUPROFEN 600 MG PO TABS: 600.0000 mg | ORAL_TABLET | Freq: Three times a day (TID) | ORAL | 0 refills | Status: DC | PRN

## 2019-01-19 MED ORDER — PREDNISONE 20 MG PO TABS: 20.0000 mg | ORAL_TABLET | Freq: Every day | ORAL | 0 refills | Status: DC

## 2019-01-20 MED FILL — TRUE METRIX TEST STRIP: 30 days supply | Qty: 100 | Fill #1

## 2019-01-20 MED FILL — GABAPENTIN 100 MG CAP: 100 | 30 days supply | Qty: 60 | Fill #1

## 2019-01-20 MED FILL — IBUPROFEN 600 MG TABLET: 600 | 7 days supply | Qty: 20 | Fill #0

## 2019-01-20 MED FILL — predniSONE 20 MG TABS: 20 | 5 days supply | Qty: 3 | Fill #0

## 2019-01-23 ENCOUNTER — Encounter: Payer: Self-pay | Admitting: Internal Medicine

## 2019-01-23 ENCOUNTER — Other Ambulatory Visit: Payer: Self-pay

## 2019-01-23 ENCOUNTER — Ambulatory Visit: Payer: Self-pay | Attending: Internal Medicine | Admitting: Internal Medicine

## 2019-01-23 VITALS — BP 119/82 | HR 80 | Temp 97.7°F | Resp 16 | Wt 195.0 lb

## 2019-01-23 DIAGNOSIS — E1142 Type 2 diabetes mellitus with diabetic polyneuropathy: Secondary | ICD-10-CM

## 2019-01-23 DIAGNOSIS — L409 Psoriasis, unspecified: Secondary | ICD-10-CM

## 2019-01-23 DIAGNOSIS — F411 Generalized anxiety disorder: Secondary | ICD-10-CM

## 2019-01-23 MED ORDER — GABAPENTIN 300 MG PO CAPS: 300.0000 mg | ORAL_CAPSULE | Freq: Every day | ORAL | 1 refills | Status: DC

## 2019-01-23 MED ORDER — TRIAMCINOLONE ACETONIDE 0.1 % EX CREA: 1.0000 "application " | TOPICAL_CREAM | Freq: Two times a day (BID) | CUTANEOUS | 0 refills | Status: DC

## 2019-01-23 MED ORDER — SERTRALINE HCL 50 MG PO TABS: ORAL_TABLET | ORAL | 0 refills | Status: DC

## 2019-01-23 MED FILL — TRIAMCINOLONE ACETONIDE 0.1: 0.1 | 30 days supply | Qty: 60 | Fill #0

## 2019-01-23 MED FILL — GABAPENTIN 300 MG CAPSULE: 300 | 30 days supply | Qty: 30 | Fill #0

## 2019-01-23 MED FILL — SERTRALINE HCL 50 MG TABS: 50 | 30 days supply | Qty: 30 | Fill #0

## 2019-01-23 NOTE — Progress Notes (Signed)
Pt presents for a rash all over Per patient she noticed after fever and being diagnosed with pharyngitis almost 4 weeks ago.   H/o psoriasis

## 2019-01-23 NOTE — Patient Instructions (Addendum)
Psoriasis    Psoriasis is a long-term (chronic) condition of skin inflammation. It occurs because your immune system causes skin cells to form too quickly. As a result, too many skin cells grow and create raised, red patches (plaques) that look silvery on your skin. Plaques may appear anywhere on your body. They can be any size or shape.  Psoriasis can come and go. The condition varies from mild to very severe. It cannot be passed from one person to another (not contagious).  What are the causes?  The cause of psoriasis is not known, but certain factors can make the condition worse. These include:   Damage or trauma to the skin, such as cuts, scrapes, sunburn, and dryness.   Lack of sunlight.   Certain medicines.   Alcohol.   Tobacco use.   Stress.   Infections caused by bacteria or viruses.  What increases the risk?  This condition is more likely to develop in:   People with a family history of psoriasis.   People who are Caucasian.   People who are between the ages of 15-30 and 50-60 years old.  What are the signs or symptoms?  There are five different types of psoriasis. You can have more than one type of psoriasis during your life. Types are:   Plaque.   Guttate.   Inverse.   Pustular.   Erythrodermic.  Each type of psoriasis has different symptoms.   Plaque psoriasis symptoms include red, raised plaques with a silvery white coating (scale). These plaques may be itchy. Your nails may be pitted and crumbly or fall off.   Guttate psoriasis symptoms include small red spots that often show up on your trunk, arms, and legs. These spots may develop after you have been sick, especially with strep throat.   Inverse psoriasis symptoms include plaques in your underarm area, under your breasts, or on your genitals, groin, or buttocks.   Pustular psoriasis symptoms include pus-filled bumps that are painful, red, and swollen on the palms of your hands or the soles of your feet. You also may feel  exhausted, feverish, weak, or have no appetite.   Erythrodermic psoriasis symptoms include bright red skin that may look burned. You may have a fast heartbeat and a body temperature that is too high or too low. You may be itchy or in pain.  How is this diagnosed?  Your health care provider may suspect psoriasis based on your symptoms and family history. Your health care provider will also do a physical exam. This may include a procedure to remove a tissue sample (biopsy) for testing. You may also be referred to a health care provider who specializes in skin diseases (dermatologist).  How is this treated?  There is no cure for this condition, but treatment can help manage it. Goals of treatment include:   Helping your skin heal.   Reducing itching and inflammation.   Slowing the growth of new skin cells.   Helping your immune system respond better to your skin.  Treatment varies, depending on the severity of your condition. Treatment may include:   Creams or ointments.   Ultraviolet ray exposure (light therapy). This may include natural sunlight or light therapy in a medical office.   Medicines (systemic therapy). These medicines can help your body better manage skin cell turnover and inflammation. They may be used along with light therapy or ointments. You may also get antibiotic medicines if you have an infection.  Follow these instructions at home:    Skin Care   Moisturize your skin as needed. Only use moisturizers that have been approved by your health care provider.   Apply cool compresses to the affected areas.   Do not scratch your skin.  Lifestyle     Do not use tobacco products. This includes cigarettes, chewing tobacco, and e-cigarettes. If you need help quitting, ask your health care provider.   Drink little or no alcohol.   Try techniques for stress reduction, such as meditation or yoga.   Get exposure to the sun as told by your health care provider. Do not get sunburned.   Consider joining  a psoriasis support group.  Medicines   Take or use over-the-counter and prescription medicines only as told by your health care provider.   If you were prescribed an antibiotic, take or use it as told by your health care provider. Do not stop taking the antibiotic even if your condition starts to improve.  General instructions   Keep a journal to help track what triggers an outbreak. Try to avoid any triggers.   See a counselor or social worker if feelings of sadness, frustration, and hopelessness about your condition are interfering with your work and relationships.   Keep all follow-up visits as told by your health care provider. This is important.  Contact a health care provider if:   Your pain gets worse.   You have increasing redness or warmth in the affected areas.   You have new or worsening pain or stiffness in your joints.   Your nails start to break easily or pull away from the nail bed.   You have a fever.   You feel depressed.  This information is not intended to replace advice given to you by your health care provider. Make sure you discuss any questions you have with your health care provider.  Document Released: 09/24/2000 Document Revised: 03/04/2016 Document Reviewed: 02/12/2015  Elsevier Interactive Patient Education  2019 [employer]

## 2019-01-23 NOTE — Progress Notes (Signed)
Patient ID: Michelle Gibson, female    DOB: [birth year]  MRN: [id number]  CC: Rash  Subjective:  Michelle Gibson is a 43 y.o. female who presents for UC visit. Her concerns today include:  Pt with hx of DM, asthma, WPW, psoriasis  C/o generalized rash that started 2 wks ago.  On further questioning, it was discovered that the rash was more so on the lower extremities and scantly scattered on the upper extremities and scalp. -It is very itchy. -She has not had any recent change in body products.  No known food allergies.  She does have a history of psoriasis but states she has never had an outbreak as bad as this.  She lives with her son.  He has not developed a similar rash.  She had a febrile illness back in February.  She has not had any fever with this rash. -She has been using oatmeal, calamine lotion and Cetaphil over-the-counter without much improvement.  She complains of feeling very anxious over the past several weeks due to legal issues, current COVID-19 pandemic and her overall health.  Gives history of anxiety disorder in the past for which she was on Paxil and clonazepam for period.  She does deep breathing exercises but feels she needs to get back on medication.  She is wanting to know whether the dose of gabapentin can be increased.  She is on it for diabetic neuropathy.  Currently on 200 mg at bedtime.  She feels that the current dose does not adequately control the burning in the feet especially at nights.   Patient Active Problem List   Diagnosis Date Noted   Fever and chills 11/17/2018   Closed fracture of upper end of fibula 09/21/2018   WPW (Wolff-Parkinson-White syndrome) 10/10/2017   Asthma with acute exacerbation 09/15/2016   Hyperglycemia 09/15/2016   History of Wolff-Parkinson-White (WPW) syndrome 09/15/2016   Acute respiratory failure with hypoxia ([place]) 09/15/2016   Bronchospasm 09/11/2016     Current Outpatient Medications on File Prior to Visit  Medication  Sig Dispense Refill   acetaminophen (TYLENOL) 325 MG tablet Take 2 tablets (650 mg total) by mouth every 6 (six) hours as needed for fever.     albuterol (PROVENTIL HFA;VENTOLIN HFA) 108 (90 Base) MCG/ACT inhaler Inhale 2 puffs into the lungs every 4 (four) hours as needed for wheezing or shortness of breath. 54 g 3   Insulin Glargine (LANTUS SOLOSTAR) 100 UNIT/ML Solostar Pen Inject 5 Units into the skin daily. To lower blood sugar (Patient taking differently: Inject 5 Units into the skin daily at 6 PM. ) 5 pen 3   ipratropium-albuterol (DUONEB) 0.5-2.5 (3) MG/3ML SOLN Take 3 mLs by nebulization every 6 (six) hours as needed (wheezing or shortness of breath). 240 mL 1   metFORMIN (GLUCOPHAGE) 500 MG tablet Take 1 tablet (500 mg total) by mouth 2 (two) times daily with a meal. 60 tablet 6   methocarbamol (ROBAXIN) 500 MG tablet Take 2 tablets (1,000 mg total) by mouth 3 (three) times daily. X 1 week then prn pain (Patient taking differently: Take 1,000 mg by mouth 3 (three) times daily as needed for muscle spasms. ) 90 tablet 0   mometasone-formoterol (DULERA) 200-5 MCG/ACT AERO Inhale 2 puffs into the lungs 2 (two) times daily. 39 Inhaler 3   ondansetron (ZOFRAN) 4 MG tablet Take 1 tablet (4 mg total) by mouth every 8 (eight) hours as needed for nausea or vomiting. 30 tablet 0   Blood Glucose Monitoring Suppl (  TRUE METRIX AIR GLUCOSE METER) w/Device KIT 1 kit by Does not apply route 2 (two) times daily. 1 kit 0   clindamycin (CLEOCIN) 300 MG capsule Take 1 capsule (300 mg total) by mouth 2 (two) times daily. (Patient not taking: Reported on 09/21/2018) 14 capsule 0   glucose blood (TRUE METRIX BLOOD GLUCOSE TEST) test strip Use as instructed to check blood sugars twice daily and as needed 100 each 6   ibuprofen (ADVIL,MOTRIN) 600 MG tablet Take 1 tablet (600 mg total) by mouth every 8 (eight) hours as needed. X 7 days then prn pain Eat prior to taking this medication 20 tablet 0    predniSONE (DELTASONE) 20 MG tablet Take 1 tablet (20 mg total) by mouth daily with breakfast. One pill today then 1/2 pill daily for 4 days (Patient not taking: Reported on 01/23/2019) 3 tablet 0   TRUEPLUS LANCETS 28G MISC Use twice daily when checking blood sugars and as needed 100 each 6   Current Facility-Administered Medications on File Prior to Visit  Medication Dose Route Frequency Provider Last Rate Last Dose   ipratropium-albuterol (DUONEB) 0.5-2.5 (3) MG/3ML nebulizer solution 3 mL  3 mL Nebulization Q20 Min PRN Fredia Beets R, FNP   3 mL at 11/14/17 1615    Allergies  Allergen Reactions   Ciprofloxacin Hives   Flecainide Hives, Nausea And Vomiting and Rash   Penicillin G Rash    Has patient had a PCN reaction causing immediate rash, facial/tongue/throat swelling, SOB or lightheadedness with hypotension: yes Has patient had a PCN reaction causing severe rash involving mucus membranes or skin necrosis: yes Has patient had a PCN reaction that required hospitalization: no Has patient had a PCN reaction occurring within the last 10 years: no If all of the above answers are "NO", then may proceed with Cephalosporin use.      ROS: Review of Systems Negative except as above.  PHYSICAL EXAM: BP 119/82 (BP Location: Right Arm, Patient Position: Sitting, Cuff Size: Normal)    Pulse 80    Temp 97.7 F (36.5 C) (Oral)    Resp 16    Wt 195 lb (88.5 kg)    SpO2 99%    BMI 31.47 kg/m   Physical Exam  General appearance - alert, well appearing, middle-aged Caucasian female and in no distress Mental status - normal mood, behavior, speech, dress, motor activity, and thought processes Skin -patient with diffuse macular rash on lower extremities that presents as small circular slightly erythematous scaly plaques.  She has a few noted on the dorsal surface of the hand and 3 small areas on the scalp.  None noted on the abdomen except around the umbilicus.  None noted on the upper  chest      ASSESSMENT AND PLAN: 1. Psoriasis Current rash looks like a flare of psoriasis.  We will try her with triamcinolone cream.  Patient to apply for the orange card/cone discount so that if this does not improve we can refer her to a dermatologist. - triamcinolone cream (KENALOG) 0.1 %; Apply 1 application topically 2 (two) times daily.  Dispense: 60 g; Refill: 0  2. Generalized anxiety disorder Discussed ways to help relieve anxiety including meditation with deep breathing exercises, regular exercise.  Patient agreeable to starting Zoloft to help control her symptoms.  She will follow keep her follow-up appointment with her PCP in 3 weeks. - sertraline (ZOLOFT) 50 MG tablet; 1/2 tab po daily x 1 wk then full tab daily  Dispense:  60 tablet; Refill: 0  3. Diabetic polyneuropathy associated with type 2 diabetes mellitus (HCC) Increase gabapentin to 300 mg at bedtime - gabapentin (NEURONTIN) 300 MG capsule; Take 1 capsule (300 mg total) by mouth at bedtime.  Dispense: 90 capsule; Refill: 1  Patient was given the opportunity to ask questions.  Patient verbalized understanding of the plan and was able to repeat key elements of the plan.   No orders of the defined types were placed in this encounter.    Requested Prescriptions   Signed Prescriptions Disp Refills   gabapentin (NEURONTIN) 300 MG capsule 90 capsule 1    Sig: Take 1 capsule (300 mg total) by mouth at bedtime.   triamcinolone cream (KENALOG) 0.1 % 60 g 0    Sig: Apply 1 application topically 2 (two) times daily.   sertraline (ZOLOFT) 50 MG tablet 60 tablet 0    Sig: 1/2 tab po daily x 1 wk then full tab daily    Future Appointments  Date Time Provider [place]  02/15/2019  2:10 PM Antony Blackbird, Michelle Gibson [id number] None    Michelle Plumber, Michelle Gibson, Michelle Gibson

## 2019-01-30 ENCOUNTER — Telehealth: Payer: Self-pay | Admitting: Family Medicine

## 2019-01-30 NOTE — Telephone Encounter (Signed)
Pt called in stated her blood sugar was 400

## 2019-01-30 NOTE — Telephone Encounter (Signed)
Patients call taken.  Patient identified by name and date of birth.  Patient called with a complaint of elevated blood sugar of 400.  Patient endorses compliance with medications.  Patient stated that she had a fasting CBG of 190 this am.  Patient ate a ham sandwich with a slice of cheese for lunch as her only meal of the day.  Patient educated on diabetic diet.  Patient then states she has shortness of breath when blood sugar is high as well as chest pressure.  Patient states pressure is 7/10.  Patient endorses shortness of breath at this time.    Patient advised with chest pains being constant, with shortness of breath to go to the ED or urgent care.  Patient acknowledged understanding of advice.

## 2019-01-30 NOTE — Telephone Encounter (Signed)
Michelle Gibson, I did not see any notes in patient's chart that she went to a [facility] affiliated urgent care or the ED. Can you contact her and see if how she is currently feeling and if she was evaluated at a medical facility today (01/30/19). Please see if there is an in-person spot for her to be seen this week or tele-health along with lab visit in follow-up of her glucose

## 2019-01-31 NOTE — Telephone Encounter (Signed)
Spoke with patient and asked her if she have been to an Urgent care or Emergency Department recently because the last ED visit was 12/16/18. Per pt she have not been to any Urgent Care or Emergency Department recently. Per patient she is still feeling shaky and when her sugar is high, she feel heaviness and gets really back headaches. Per pt her sugar this morning was 170 and her sugar for yesterday was 400 all day. Staff scheduled a in person visit for patient to come to office 02-01-19 due to her DM.

## 2019-02-01 ENCOUNTER — Encounter: Payer: Self-pay | Admitting: Family Medicine

## 2019-02-01 ENCOUNTER — Ambulatory Visit: Payer: Self-pay | Attending: Family Medicine | Admitting: Family Medicine

## 2019-02-01 ENCOUNTER — Telehealth: Payer: Self-pay | Admitting: *Deleted

## 2019-02-01 ENCOUNTER — Other Ambulatory Visit: Payer: Self-pay

## 2019-02-01 VITALS — BP 118/79 | HR 93 | Temp 98.7°F | Ht 66.0 in | Wt 196.8 lb

## 2019-02-01 DIAGNOSIS — E1165 Type 2 diabetes mellitus with hyperglycemia: Secondary | ICD-10-CM

## 2019-02-01 DIAGNOSIS — Z794 Long term (current) use of insulin: Secondary | ICD-10-CM

## 2019-02-01 DIAGNOSIS — J4540 Moderate persistent asthma, uncomplicated: Secondary | ICD-10-CM

## 2019-02-01 DIAGNOSIS — R21 Rash and other nonspecific skin eruption: Secondary | ICD-10-CM

## 2019-02-01 DIAGNOSIS — F411 Generalized anxiety disorder: Secondary | ICD-10-CM

## 2019-02-01 LAB — POCT URINALYSIS DIP (CLINITEK)
Bilirubin, UA: NEGATIVE
Blood, UA: NEGATIVE
Glucose, UA: 500 mg/dL — AB
Ketones, POC UA: NEGATIVE mg/dL
Nitrite, UA: NEGATIVE
POC PROTEIN,UA: NEGATIVE
Spec Grav, UA: 1.01
Urobilinogen, UA: 0.2 U/dL
pH, UA: 5.5

## 2019-02-01 LAB — GLUCOSE, POCT (MANUAL RESULT ENTRY)
POC Glucose: 426 mg/dL — AB (ref 70–99)
POC Glucose: 313 mg/dL — AB (ref 70–99)

## 2019-02-01 LAB — POCT GLYCOSYLATED HEMOGLOBIN (HGB A1C): Hemoglobin A1C: 7.6 % — AB (ref 4.0–5.6)

## 2019-02-01 MED ORDER — HYDROXYZINE HCL 10 MG PO TABS: 10.0000 mg | ORAL_TABLET | Freq: Three times a day (TID) | ORAL | 0 refills | Status: DC | PRN

## 2019-02-01 MED ORDER — PREDNISONE 20 MG PO TABS: ORAL_TABLET | ORAL | 0 refills | Status: DC

## 2019-02-01 MED ORDER — INSULIN ASPART 100 UNIT/ML ~~LOC~~ SOLN
20.0000 [IU] | Freq: Once | SUBCUTANEOUS | Status: AC
  Administered 2019-02-01: 20 [IU] via SUBCUTANEOUS

## 2019-02-01 MED ORDER — INSULIN GLARGINE 100 UNIT/ML SOLOSTAR PEN: 15.0000 [IU] | PEN_INJECTOR | Freq: Every day | SUBCUTANEOUS | 3 refills | Status: DC

## 2019-02-01 MED FILL — hydrOXYzine HCL 10 MG TABS: 10 | 10 days supply | Qty: 30 | Fill #0

## 2019-02-01 MED FILL — predniSONE 20 MG TABS: 20 | 7 days supply | Qty: 6 | Fill #0

## 2019-02-01 MED FILL — !LANTUS SOLOSTAR 100UNITS/M: 100 | 20 days supply | Qty: 3 | Fill #0

## 2019-02-01 NOTE — Patient Instructions (Signed)

## 2019-02-01 NOTE — Progress Notes (Signed)
Per pt she is here due to her DM   She also would like to talk about her Shaking all the time  Per pt she broke her Nebulizer and would like to know what to do because she's been needing it lately.    Per patient she have rash all over her body  Per pt she have not had lunch yet just ate this morning

## 2019-02-01 NOTE — Progress Notes (Signed)
New Patient Office Visit  Subjective:  Patient ID: Alla Sloma, female    DOB: 02/21/1976  Age: 43 y.o. MRN: [id number]  CC:  Chief Complaint  Patient presents with  . Diabetes  . Shaking  . Medication Refill  . Medication Management    HPI Soffia Doshier presents for follow-up of chronic medical issues and new complaints. Patient was last seen in the office on 01/23/2019 by another provider due to a rash which was diagnosed as a flare-up of psoriasis and patient with anxiety for which she was given a prescription to start zoloft 50 mg. Gabapentin was also increased to 300 mg at bedtime to help with neuropathy. She reports that the zoloft has helped lessen her anxiety. She reports however that her rash has not improved and is itchy. She does not recall a specific "hearld patch" but states that there is an area of greater concentration of the rash on her right upper thigh.       When she checks her blood sugars they have been elevated over the past month. Blood sugars are often in the 300's. She has had increased thirst and urinary frequency. No dysuria. She has continued to take Lantus at 6 units. She has noticed no other changes in her diet or activity level. She has had some fatigue and sometimes feels that her hands are shaky.   Past Medical History:  Diagnosis Date  . Asthma   . Diabetes mellitus without complication ([place])    TYPE II  . WPW (Wolff-Parkinson-White syndrome)     Past Surgical History:  Procedure Laterality Date  . ABDOMINAL HYSTERECTOMY    . CARDIAC SURGERY    . CESAREAN SECTION     Social History   Tobacco Use  . Smoking status: Current Some Day Smoker    Packs/day: 0.10    Types: Cigarettes  . Smokeless tobacco: Never Used  Substance Use Topics  . Alcohol use: No  . Drug use: No   Allergies  Allergen Reactions  . Ciprofloxacin Hives  . Flecainide Hives, Nausea And Vomiting and Rash  . Penicillin G Rash    Has patient had a PCN reaction causing  immediate rash, facial/tongue/throat swelling, SOB or lightheadedness with hypotension: yes Has patient had a PCN reaction causing severe rash involving mucus membranes or skin necrosis: yes Has patient had a PCN reaction that required hospitalization: no Has patient had a PCN reaction occurring within the last 10 years: no If all of the above answers are "NO", then may proceed with Cephalosporin use.     ROS Review of Systems  Constitutional: Positive for fatigue. Negative for chills and fever.  HENT: Negative for congestion and sore throat.   Respiratory: Positive for chest tightness and wheezing. Negative for cough and shortness of breath.   Cardiovascular: Negative for chest pain, palpitations and leg swelling.  Gastrointestinal: Negative for abdominal pain, constipation, diarrhea and nausea.  Endocrine: Positive for polydipsia and polyuria.  Genitourinary: Positive for frequency. Negative for dysuria.  Skin: Positive for rash. Negative for wound.  Neurological: Negative for dizziness and headaches.  Hematological: Negative for adenopathy. Does not bruise/bleed easily.  Psychiatric/Behavioral: Negative for suicidal ideas. The patient is nervous/anxious.     Objective:   Today's Vitals: BP 118/79 (BP Location: Right Arm, Patient Position: Sitting, Cuff Size: Large)   Pulse 93   Temp 98.7 F (37.1 C) (Oral)   Ht 5' 6" (1.676 m)   Wt 196 lb 12.8 oz (89.3 kg)  SpO2 97%   BMI 31.76 kg/m   Physical Exam Constitutional:      Appearance: Normal appearance.  Neck:     Musculoskeletal: Normal range of motion and neck supple.  Cardiovascular:     Rate and Rhythm: Normal rate and regular rhythm.     Pulses:          Dorsalis pedis pulses are 2+ on the right side and 2+ on the left side.       Posterior tibial pulses are 2+ on the right side and 2+ on the left side.  Pulmonary:     Effort: Pulmonary effort is normal.     Breath sounds: Normal breath sounds. No wheezing.    Abdominal:     Palpations: Abdomen is soft.     Tenderness: There is no abdominal tenderness. There is no right CVA tenderness, left CVA tenderness, guarding or rebound.  Musculoskeletal:        General: No tenderness.     Right lower leg: No edema.     Left lower leg: No edema.     Right foot: Normal range of motion. No deformity.     Left foot: Normal range of motion. No deformity.  Feet:     Right foot:     Skin integrity: No ulcer, blister, skin breakdown, erythema, warmth, callus, dry skin or fissure.     Toenail Condition: Right toenails are normal.     Left foot:     Skin integrity: No ulcer, blister, skin breakdown, erythema, warmth, callus, dry skin or fissure.     Toenail Condition: Left toenails are normal.  Lymphadenopathy:     Cervical: No cervical adenopathy.  Skin:    Findings: Rash present. No erythema.     Comments: Patient with small raised papules/lesions especially on the legs, also present on the trunk; lesions with mild erythema and scaly, dry tops   Neurological:     General: No focal deficit present.     Mental Status: She is alert and oriented to person, place, and time.  Psychiatric:     Comments: Patient appears slighlty nervous/anxious at times     Assessment & Plan:  1. Type 2 diabetes mellitus with hyperglycemia, with long-term current use of insulin (HCC) Patient with random glucose of 426 and she was given 20 units of regular insulin here in the office. She is currently only taking 6 units of Lantus  and she was asked to take an additional 10 units tonight and then tomorrow she should start with 15 units of Lantus daily and continue to monitor her blood sugars-call if blood sugars are remaining above 200. Follow-up in 1 week. Will check BMP. UA done in office did not show the presence of ketones.  - HgB A1c - insulin aspart (novoLOG) injection 20 Units - POCT URINALYSIS DIP (CLINITEK) - Insulin Glargine (LANTUS SOLOSTAR) 100 UNIT/ML Solostar Pen;  Inject 15 Units into the skin daily. To lower blood sugar  Dispense: 5 pen; Refill: 3 - Glucose (CBG) - Basic Metabolic Panel - Glucose (CBG)  2. Rash and nonspecific skin eruption Patient with a generalized rash especially on the legs but also on the trunk. There does not appear to be a Christmas tree pattern to the rash on patient's back. Rash would look atypical for psoriasis but is somewhat similar to psittarysis. RX for prednisone for itching and this may also help with her asthma symptoms. Patient aware that the prednisone will also likely contribute to her blood  sugar remaining elevated.  Referral to dermatology - predniSONE (DELTASONE) 20 MG tablet; Take 2 pills today then 1 pill daily for 2 days then 1/2 pill daily for 4 days; eat before taking medication  Dispense: 6 tablet; Refill: 0 - Ambulatory referral to Dermatology  3. Generalized anxiety disorder Patient requests a refill for hydroxyzine and this may also help with the itching from her rash and she will continue use of zoloft - hydrOXYzine (ATARAX/VISTARIL) 10 MG tablet; Take 1 tablet (10 mg total) by mouth 3 (three) times daily as needed.  Dispense: 30 tablet; Refill: 0  4. Moderate persistent asthma, unspecified whether complicated Prednisone taper and continue use of albuterol as needed as well as continued use of Dulera  An After Visit Summary was printed and given to the patient.  Allergies as of 02/01/2019      Reactions   Ciprofloxacin Hives   Flecainide Hives, Nausea And Vomiting, Rash   Penicillin G Rash   Has patient had a PCN reaction causing immediate rash, facial/tongue/throat swelling, SOB or lightheadedness with hypotension: yes Has patient had a PCN reaction causing severe rash involving mucus membranes or skin necrosis: yes Has patient had a PCN reaction that required hospitalization: no Has patient had a PCN reaction occurring within the last 10 years: no If all of the above answers are "NO", then may  proceed with Cephalosporin use.      Medication List       Accurate as of February 01, 2019 11:59 PM. Always use your most recent med list.        acetaminophen 325 MG tablet Commonly known as:  Tylenol Take 2 tablets (650 mg total) by mouth every 6 (six) hours as needed for fever.   albuterol 108 (90 Base) MCG/ACT inhaler Commonly known as:  VENTOLIN HFA Inhale 2 puffs into the lungs every 4 (four) hours as needed for wheezing or shortness of breath.   clindamycin 300 MG capsule Commonly known as:  CLEOCIN Take 1 capsule (300 mg total) by mouth 2 (two) times daily.   gabapentin 300 MG capsule Commonly known as:  NEURONTIN Take 1 capsule (300 mg total) by mouth at bedtime.   glucose blood test strip Commonly known as:  True Metrix Blood Glucose Test Use as instructed to check blood sugars twice daily and as needed   hydrOXYzine 10 MG tablet Commonly known as:  ATARAX/VISTARIL Take 1 tablet (10 mg total) by mouth 3 (three) times daily as needed.   ibuprofen 600 MG tablet Commonly known as:  ADVIL Take 1 tablet (600 mg total) by mouth every 8 (eight) hours as needed. X 7 days then prn pain Eat prior to taking this medication   Insulin Glargine 100 UNIT/ML Solostar Pen Commonly known as:  Lantus SoloStar Inject 15 Units into the skin daily. To lower blood sugar   ipratropium-albuterol 0.5-2.5 (3) MG/3ML Soln Commonly known as:  DUONEB Take 3 mLs by nebulization every 6 (six) hours as needed (wheezing or shortness of breath).   metFORMIN 500 MG tablet Commonly known as:  GLUCOPHAGE Take 1 tablet (500 mg total) by mouth 2 (two) times daily with a meal.   methocarbamol 500 MG tablet Commonly known as:  ROBAXIN Take 2 tablets (1,000 mg total) by mouth 3 (three) times daily. X 1 week then prn pain   mometasone-formoterol 200-5 MCG/ACT Aero Commonly known as:  DULERA Inhale 2 puffs into the lungs 2 (two) times daily.   ondansetron 4 MG tablet Commonly known  as:   ZOFRAN Take 1 tablet (4 mg total) by mouth every 8 (eight) hours as needed for nausea or vomiting.   predniSONE 20 MG tablet Commonly known as:  DELTASONE Take 1 tablet (20 mg total) by mouth daily with breakfast. One pill today then 1/2 pill daily for 4 days   predniSONE 20 MG tablet Commonly known as:  DELTASONE Take 2 pills today then 1 pill daily for 2 days then 1/2 pill daily for 4 days; eat before taking medication   sertraline 50 MG tablet Commonly known as:  Zoloft 1/2 tab po daily x 1 wk then full tab daily   triamcinolone cream 0.1 % Commonly known as:  KENALOG Apply 1 application topically 2 (two) times daily.   True Metrix Air Glucose Meter w/Device Kit 1 kit by Does not apply route 2 (two) times daily.   TRUEplus Lancets 28G Misc Use twice daily when checking blood sugars and as needed       Follow-up: Return in about 1 week (around 02/08/2019) for DM-go to ED if not feeling better or BS above 400.  Antony Blackbird, MD

## 2019-02-01 NOTE — Telephone Encounter (Signed)
[employer] charity Nebulizer was given to patient before she left today. Per pt she knew how to use machine.

## 2019-02-02 LAB — BASIC METABOLIC PANEL WITH GFR
BUN/Creatinine Ratio: 19 (ref 9–23)
BUN: 18 mg/dL (ref 6–24)
CO2: 22 mmol/L (ref 20–29)
Calcium: 10.3 mg/dL — ABNORMAL HIGH (ref 8.7–10.2)
Chloride: 97 mmol/L (ref 96–106)
Creatinine, Ser: 0.97 mg/dL (ref 0.57–1.00)
GFR calc Af Amer: 84 mL/min/1.73
GFR calc non Af Amer: 73 mL/min/1.73
Glucose: 295 mg/dL — ABNORMAL HIGH (ref 65–99)
Potassium: 4.9 mmol/L (ref 3.5–5.2)
Sodium: 138 mmol/L (ref 134–144)

## 2019-02-15 ENCOUNTER — Ambulatory Visit: Payer: Self-pay | Admitting: Family Medicine

## 2019-02-21 ENCOUNTER — Other Ambulatory Visit: Payer: Self-pay | Admitting: Family Medicine

## 2019-02-21 DIAGNOSIS — F411 Generalized anxiety disorder: Secondary | ICD-10-CM

## 2019-02-21 DIAGNOSIS — G8929 Other chronic pain: Secondary | ICD-10-CM

## 2019-02-21 DIAGNOSIS — R519 Headache, unspecified: Secondary | ICD-10-CM

## 2019-02-24 ENCOUNTER — Other Ambulatory Visit: Payer: Self-pay | Admitting: Family Medicine

## 2019-02-24 DIAGNOSIS — F411 Generalized anxiety disorder: Secondary | ICD-10-CM

## 2019-02-24 DIAGNOSIS — M5441 Lumbago with sciatica, right side: Secondary | ICD-10-CM

## 2019-02-24 DIAGNOSIS — G8929 Other chronic pain: Secondary | ICD-10-CM

## 2019-02-24 DIAGNOSIS — R519 Headache, unspecified: Secondary | ICD-10-CM

## 2019-02-26 NOTE — Telephone Encounter (Signed)
Refill request

## 2019-02-26 NOTE — Telephone Encounter (Signed)
Patient must have an appointment for refill. Please schedule for tele visit

## 2019-02-27 ENCOUNTER — Telehealth: Payer: Self-pay | Admitting: Family Medicine

## 2019-02-27 NOTE — Telephone Encounter (Signed)
Called patient and LVM to return call and schedule a televisit appointment so she can have her medications refilled.

## 2019-03-03 ENCOUNTER — Other Ambulatory Visit: Payer: Self-pay | Admitting: Family Medicine

## 2019-03-03 DIAGNOSIS — F411 Generalized anxiety disorder: Secondary | ICD-10-CM

## 2019-03-03 DIAGNOSIS — R519 Headache, unspecified: Secondary | ICD-10-CM

## 2019-03-03 DIAGNOSIS — G8929 Other chronic pain: Secondary | ICD-10-CM

## 2019-03-06 MED ORDER — HYDROXYZINE HCL 10 MG PO TABS: 10.0000 mg | ORAL_TABLET | Freq: Three times a day (TID) | ORAL | 0 refills | Status: DC | PRN

## 2019-03-06 MED ORDER — IBUPROFEN 600 MG PO TABS: 600.0000 mg | ORAL_TABLET | Freq: Three times a day (TID) | ORAL | 0 refills | Status: DC | PRN

## 2019-03-06 MED FILL — hydrOXYzine HCL 10 MG TABS: 10 | 10 days supply | Qty: 30 | Fill #0

## 2019-03-06 MED FILL — GABAPENTIN 300 MG CAPSULE: 300 | 30 days supply | Qty: 30 | Fill #1

## 2019-03-06 MED FILL — ?IBUPROFEN 600 MG TABS: 600 | 7 days supply | Qty: 20 | Fill #0

## 2019-03-06 NOTE — Telephone Encounter (Signed)
Refill request

## 2019-03-17 ENCOUNTER — Emergency Department (HOSPITAL_COMMUNITY)
Admission: EM | Admit: 2019-03-17 | Discharge: 2019-03-17 | Disposition: A | Discharge status: Home / Self Care | Payer: Self-pay | Attending: Emergency Medicine | Admitting: Emergency Medicine

## 2019-03-17 ENCOUNTER — Other Ambulatory Visit: Payer: Self-pay

## 2019-03-17 ENCOUNTER — Encounter (HOSPITAL_COMMUNITY): Payer: Self-pay

## 2019-03-17 ENCOUNTER — Emergency Department (HOSPITAL_COMMUNITY): Payer: Self-pay

## 2019-03-17 DIAGNOSIS — E119 Type 2 diabetes mellitus without complications: Secondary | ICD-10-CM | POA: Insufficient documentation

## 2019-03-17 DIAGNOSIS — Z794 Long term (current) use of insulin: Secondary | ICD-10-CM | POA: Insufficient documentation

## 2019-03-17 DIAGNOSIS — N200 Calculus of kidney: Secondary | ICD-10-CM

## 2019-03-17 DIAGNOSIS — R110 Nausea: Secondary | ICD-10-CM | POA: Insufficient documentation

## 2019-03-17 DIAGNOSIS — Z79899 Other long term (current) drug therapy: Secondary | ICD-10-CM | POA: Insufficient documentation

## 2019-03-17 DIAGNOSIS — N201 Calculus of ureter: Secondary | ICD-10-CM | POA: Insufficient documentation

## 2019-03-17 DIAGNOSIS — F17210 Nicotine dependence, cigarettes, uncomplicated: Secondary | ICD-10-CM | POA: Insufficient documentation

## 2019-03-17 LAB — CBC
HCT: 39.5 % (ref 36.0–46.0)
Hemoglobin: 12.9 g/dL (ref 12.0–15.0)
MCH: 29.7 pg (ref 26.0–34.0)
MCHC: 32.7 g/dL (ref 30.0–36.0)
MCV: 90.8 fL (ref 80.0–100.0)
Platelets: 341 10*3/uL (ref 150–400)
RBC: 4.35 MIL/uL (ref 3.87–5.11)
RDW: 12.6 % (ref 11.5–15.5)
WBC: 8.5 10*3/uL (ref 4.0–10.5)
nRBC: 0 % (ref 0.0–0.2)

## 2019-03-17 LAB — URINALYSIS, ROUTINE W REFLEX MICROSCOPIC
Bilirubin Urine: NEGATIVE
Glucose, UA: NEGATIVE mg/dL
Ketones, ur: NEGATIVE mg/dL
Nitrite: NEGATIVE
Protein, ur: NEGATIVE mg/dL
Specific Gravity, Urine: 1.023 (ref 1.005–1.030)
pH: 5 (ref 5.0–8.0)

## 2019-03-17 LAB — BASIC METABOLIC PANEL
Anion gap: 10 (ref 5–15)
BUN: 15 mg/dL (ref 6–20)
CO2: 23 mmol/L (ref 22–32)
Calcium: 9.3 mg/dL (ref 8.9–10.3)
Chloride: 110 mmol/L (ref 98–111)
Creatinine, Ser: 0.87 mg/dL (ref 0.44–1.00)
GFR calc Af Amer: >60 mL/min (ref >60)
GFR calc non Af Amer: >60 mL/min (ref >60)
Glucose, Bld: 228 mg/dL — ABNORMAL HIGH (ref 70–99)
Potassium: 4.1 mmol/L (ref 3.5–5.1)
Sodium: 143 mmol/L (ref 135–145)

## 2019-03-17 MED ORDER — DIAZEPAM 5 MG PO TABS
10.0000 mg | ORAL_TABLET | Freq: Once | ORAL | Status: AC
  Administered 2019-03-17: 10 mg via ORAL
  Filled 2019-03-17: qty 2

## 2019-03-17 MED ORDER — KETOROLAC TROMETHAMINE 30 MG/ML IJ SOLN
30.0000 mg | Freq: Once | INTRAMUSCULAR | Status: AC
  Administered 2019-03-17: 30 mg via INTRAVENOUS
  Filled 2019-03-17: qty 1

## 2019-03-17 MED ORDER — OXYCODONE-ACETAMINOPHEN 5-325 MG PO TABS: 2.0000 | ORAL_TABLET | ORAL | 0 refills | Status: DC | PRN

## 2019-03-17 MED ORDER — OXYCODONE-ACETAMINOPHEN 5-325 MG PO TABS
1.0000 | ORAL_TABLET | Freq: Once | ORAL | Status: AC
  Administered 2019-03-17: 1 via ORAL
  Filled 2019-03-17: qty 1

## 2019-03-17 MED ORDER — MORPHINE SULFATE (PF) 4 MG/ML IV SOLN
4.0000 mg | Freq: Once | INTRAVENOUS | Status: AC
  Administered 2019-03-17: 4 mg via INTRAVENOUS
  Filled 2019-03-17: qty 1

## 2019-03-17 MED ORDER — ONDANSETRON HCL 4 MG/2ML IJ SOLN
4.0000 mg | Freq: Once | INTRAMUSCULAR | Status: AC
  Administered 2019-03-17: 4 mg via INTRAVENOUS
  Filled 2019-03-17: qty 2

## 2019-03-17 MED ORDER — PHENAZOPYRIDINE HCL 100 MG PO TABS
100.0000 mg | ORAL_TABLET | Freq: Once | ORAL | Status: AC
  Administered 2019-03-17: 100 mg via ORAL
  Filled 2019-03-17: qty 1

## 2019-03-17 MED ORDER — ONDANSETRON 4 MG PO TBDP: 4.0000 mg | ORAL_TABLET | Freq: Three times a day (TID) | ORAL | 0 refills | Status: DC | PRN

## 2019-03-17 NOTE — ED Provider Notes (Signed)
[facility] EMERGENCY DEPARTMENT Provider Note   CSN: [id number] Arrival date & time: 03/17/19  1545    History   Chief Complaint Chief Complaint  Patient presents with  . Flank Pain  . Leg Pain    HPI Michelle Gibson is a 43 y.o. female.     43 year old female with past medical history including interstitial cystitis with Medtronic stimulator, WPW, type 2 diabetes mellitus, asthma who presents with back pain.  The patient was walking at [facility] this afternoon just prior to arrival when she had a sudden onset of left sided mid to low back pain.  The pain is constant and intermittently becomes severe in waves.  Pain is nonradiating and is worse with any movement.  She denies any leg weakness or numbness.  She has not had any new urinary symptoms recently.  No fevers or cough/cold symptoms.  She has had nausea.  She received 100 mcg of fentanyl and 500 mL's normal saline by EMS in route.  The history is provided by the patient.  Flank Pain   Leg Pain    Past Medical History:  Diagnosis Date  . Asthma   . Diabetes mellitus without complication ([place])    TYPE II  . WPW (Wolff-Parkinson-White syndrome)     Patient Active Problem List   Diagnosis Date Noted  . Fever and chills 11/17/2018  . Closed fracture of upper end of fibula 09/21/2018  . WPW (Wolff-Parkinson-White syndrome) 10/10/2017  . Asthma with acute exacerbation 09/15/2016  . Hyperglycemia 09/15/2016  . History of Wolff-Parkinson-White (WPW) syndrome 09/15/2016  . Acute respiratory failure with hypoxia ([place]) 09/15/2016  . Bronchospasm 09/11/2016    Past Surgical History:  Procedure Laterality Date  . ABDOMINAL HYSTERECTOMY    . CARDIAC SURGERY    . CESAREAN SECTION       OB History   No obstetric history on file.      Home Medications    Prior to Admission medications   Medication Sig Start Date End Date Taking? Authorizing Provider  albuterol (PROVENTIL HFA;VENTOLIN HFA) 108 (90  Base) MCG/ACT inhaler Inhale 2 puffs into the lungs every 4 (four) hours as needed for wheezing or shortness of breath. 10/17/17  Yes Hairston, Maylon Peppers, FNP  gabapentin (NEURONTIN) 300 MG capsule Take 1 capsule (300 mg total) by mouth at bedtime. 01/23/19  Yes Ladell Pier, MD  hydrOXYzine (ATARAX/VISTARIL) 10 MG tablet Take 1 tablet (10 mg total) by mouth 3 (three) times daily as needed. 03/06/19  Yes Fulp, Cammie, MD  ibuprofen (ADVIL) 600 MG tablet Take 1 tablet (600 mg total) by mouth every 8 (eight) hours as needed. X 7 days then prn pain Eat prior to taking this medication 03/06/19  Yes Fulp, Cammie, MD  Insulin Glargine (LANTUS SOLOSTAR) 100 UNIT/ML Solostar Pen Inject 15 Units into the skin daily. To lower blood sugar 02/01/19  Yes Fulp, Cammie, MD  ipratropium-albuterol (DUONEB) 0.5-2.5 (3) MG/3ML SOLN Take 3 mLs by nebulization every 6 (six) hours as needed (wheezing or shortness of breath). 11/14/17  Yes Alfonse Spruce, FNP  metFORMIN (GLUCOPHAGE) 500 MG tablet Take 1 tablet (500 mg total) by mouth 2 (two) times daily with a meal. 08/23/18  Yes Fulp, Cammie, MD  methocarbamol (ROBAXIN) 500 MG tablet Take 2 tablets (1,000 mg total) by mouth 3 (three) times daily. X 1 week then prn pain Patient taking differently: Take 500 mg by mouth as needed for muscle spasms.  07/20/18  Yes Argentina Donovan,  PA-C  mometasone-formoterol (DULERA) 200-5 MCG/ACT AERO Inhale 2 puffs into the lungs 2 (two) times daily. Patient taking differently: Inhale 2 puffs into the lungs as needed for wheezing or shortness of breath.  10/17/17  Yes Hairston, Maylon Peppers, FNP  Multiple Vitamins-Calcium (ONE-A-DAY WOMENS PO) Take 1 tablet by mouth daily.   Yes [provider]  ondansetron (ZOFRAN) 4 MG tablet Take 1 tablet (4 mg total) by mouth every 8 (eight) hours as needed for nausea or vomiting. 07/20/18  Yes McClung, Angela M, PA-C  sertraline (ZOLOFT) 50 MG tablet 1/2 tab po daily x 1 wk then full tab daily  Patient taking differently: Take 50 mg by mouth at bedtime.  01/23/19  Yes Ladell Pier, MD  triamcinolone cream (KENALOG) 0.1 % Apply 1 application topically 2 (two) times daily. 01/23/19  Yes Ladell Pier, MD  acetaminophen (TYLENOL) 325 MG tablet Take 2 tablets (650 mg total) by mouth every 6 (six) hours as needed for fever. Patient not taking: Reported on 03/17/2019 11/14/17   Alfonse Spruce, FNP  Blood Glucose Monitoring Suppl (TRUE METRIX AIR GLUCOSE METER) w/Device KIT 1 kit by Does not apply route 2 (two) times daily. 09/19/18   Fulp, Cammie, MD  clindamycin (CLEOCIN) 300 MG capsule Take 1 capsule (300 mg total) by mouth 2 (two) times daily. Patient not taking: Reported on 03/17/2019 08/23/18   Fulp, Cammie, MD  glucose blood (TRUE METRIX BLOOD GLUCOSE TEST) test strip Use as instructed to check blood sugars twice daily and as needed 09/19/18   Fulp, Cammie, MD  ondansetron (ZOFRAN ODT) 4 MG disintegrating tablet Take 1 tablet (4 mg total) by mouth every 8 (eight) hours as needed for nausea or vomiting. 03/17/19   , Wenda Overland, MD  oxyCODONE-acetaminophen (PERCOCET) 5-325 MG tablet Take 2 tablets by mouth every 4 (four) hours as needed. 03/17/19   , Wenda Overland, MD  predniSONE (DELTASONE) 20 MG tablet Take 1 tablet (20 mg total) by mouth daily with breakfast. One pill today then 1/2 pill daily for 4 days Patient not taking: Reported on 03/17/2019 01/19/19   Fulp, Ander Gaster, MD  predniSONE (DELTASONE) 20 MG tablet Take 2 pills today then 1 pill daily for 2 days then 1/2 pill daily for 4 days; eat before taking medication Patient not taking: Reported on 03/17/2019 02/01/19   Antony Blackbird, MD  TRUEPLUS LANCETS 28G MISC Use twice daily when checking blood sugars and as needed 09/19/18   Antony Blackbird, MD    Family History History reviewed. No pertinent family history.  Social History Social History   Tobacco Use  . Smoking status: Current Some Day Smoker    Packs/day: 0.10     Types: Cigarettes  . Smokeless tobacco: Never Used  Substance Use Topics  . Alcohol use: No  . Drug use: No     Allergies   Ciprofloxacin; Flecainide; and Penicillin g   Review of Systems Review of Systems  Genitourinary: Positive for flank pain.   All other systems reviewed and are negative except that which was mentioned in HPI   Physical Exam Updated Vital Signs BP 114/85   Pulse 89   Temp 98.2 F (36.8 C) (Oral)   Resp 16   Ht _0  (1.676 m)   Wt 89.8 kg   SpO2 97%   BMI 31.96 kg/m   Physical Exam Vitals signs and nursing note reviewed.  Constitutional:      Appearance: She is well-developed.     Comments:  In mild distress due to pain  HENT:     Head: Normocephalic and atraumatic.  Eyes:     Conjunctiva/sclera: Conjunctivae normal.  Neck:     Musculoskeletal: Neck supple.  Cardiovascular:     Rate and Rhythm: Normal rate and regular rhythm.     Heart sounds: Normal heart sounds. No murmur.  Pulmonary:     Effort: Pulmonary effort is normal.     Breath sounds: Normal breath sounds.  Abdominal:     General: Bowel sounds are normal. There is no distension.     Palpations: Abdomen is soft.     Tenderness: There is no abdominal tenderness.  Musculoskeletal:     Right lower leg: No edema.     Left lower leg: No edema.     Comments: Tenderness L lower thoracic/upper lumbar paraspinal muscle region, no rash  Skin:    General: Skin is warm and dry.  Neurological:     Mental Status: She is alert and oriented to person, place, and time.     Sensory: No sensory deficit.     Motor: No weakness.     Deep Tendon Reflexes: Reflexes normal.     Comments: Fluent speech  Psychiatric:        Judgment: Judgment normal.      ED Treatments / Results  Labs (all labs ordered are listed, but only abnormal results are displayed) Labs Reviewed  BASIC METABOLIC PANEL - Abnormal; Notable for the following components:      Result Value   Glucose, Bld 228 (*)     All other components within normal limits  URINALYSIS, ROUTINE W REFLEX MICROSCOPIC - Abnormal; Notable for the following components:   APPearance HAZY (*)    Hgb urine dipstick MODERATE (*)    Leukocytes,Ua TRACE (*)    Bacteria, UA RARE (*)    All other components within normal limits  CBC  POC URINE PREG, ED    EKG None  Radiology Ct Renal Stone Study  Result Date: 03/17/2019 CLINICAL DATA:  Sudden onset left flank pain and nausea beginning this afternoon. EXAM: CT ABDOMEN AND PELVIS WITHOUT CONTRAST TECHNIQUE: Multidetector CT imaging of the abdomen and pelvis was performed following the standard protocol without IV contrast. COMPARISON:  06/03/2018 FINDINGS: Lower chest: Lung bases are normal. Hepatobiliary: Previous cholecystectomy. Liver and biliary tree are normal. Pancreas: Normal. Spleen: Normal. Adrenals/Urinary Tract: Adrenal glands are normal. Kidneys are normal in size. Mild prominence of the left renal pelvis. No perinephric inflammation or fluid. No renal stones visualized. Right ureter is normal. There is a 2-3 mm stone over the distal left ureter at the UVJ. Bladder is nondistended. Stomach/Bowel: Stomach and small bowel are normal. Appendix not visualized and likely previously resected. Colon is normal. Vascular/Lymphatic: Normal. Reproductive: Previous hysterectomy. Other: No free fluid or focal inflammatory change. Musculoskeletal: No acute findings. IMPRESSION: 2-3 mm stone at the left UVJ causing very minimal obstruction. Postsurgical changes as described. Electronically Signed   By: Marin Olp M.D.   On: 03/17/2019 17:45    Procedures Procedures (including critical care time)  Medications Ordered in ED Medications  ketorolac (TORADOL) 30 MG/ML injection 30 mg (30 mg Intravenous Given 03/17/19 1634)  diazepam (VALIUM) tablet 10 mg (10 mg Oral Given 03/17/19 1633)  ondansetron (ZOFRAN) injection 4 mg (4 mg Intravenous Given 03/17/19 1753)  morphine 4 MG/ML injection 4 mg  (4 mg Intravenous Given 03/17/19 1914)  oxyCODONE-acetaminophen (PERCOCET/ROXICET) 5-325 MG per tablet 1 tablet (1 tablet Oral Given  03/17/19 1913)  phenazopyridine (PYRIDIUM) tablet 100 mg (100 mg Oral Given 03/17/19 2004)  morphine 4 MG/ML injection 4 mg (4 mg Intravenous Given 03/17/19 2123)  oxyCODONE-acetaminophen (PERCOCET/ROXICET) 5-325 MG per tablet 1 tablet (1 tablet Oral Given 03/17/19 2123)     Initial Impression / Assessment and Plan / ED Course  I have reviewed the triage vital signs and the nursing notes.  Pertinent labs & imaging results that were available during my care of the patient were reviewed by me and considered in my medical decision making (see chart for details).       No abdominal tenderness on exam or urinary symptoms suggestive of pyelo. VS reassuring. Gave toradol and valium and obtained labs and CT renal study to r/o stone, given unilateral, sudden onset pain w/ nausea.  UA with trace RBC, WBC, no infection. Normal CMP and CBC. CT shows 2-24m stone at L UVJ.  Above pain medications, patient was more comfortable on reassessment and has been tolerating p.o.  I discussed expected course and supportive measures at home, provided with pain medication and Zofran to use as needed.  Also provided with urology follow-up information.  I have extensively reviewed return precautions with her and she voiced understanding.  Final Clinical Impressions(s) / ED Diagnoses   Final diagnoses:  Kidney stone    ED Discharge Orders         Ordered    ondansetron (ZOFRAN ODT) 4 MG disintegrating tablet  Every 8 hours PRN     03/17/19 2254    oxyCODONE-acetaminophen (PERCOCET) 5-325 MG tablet  Every 4 hours PRN     03/17/19 2254           , RWenda Overland MD 03/17/19 2322

## 2019-03-17 NOTE — ED Notes (Signed)
Call pt's sister Lattie Haw for updates or discharge [phone]

## 2019-03-17 NOTE — ED Notes (Signed)
Patient verbalizes understanding of discharge instructions. Opportunity for questioning and answers were provided. Armband removed by staff, pt discharged from ED ambulatory.   

## 2019-03-17 NOTE — ED Notes (Signed)
Patient transported to CT 

## 2019-03-17 NOTE — ED Triage Notes (Addendum)
Per [facility], pt from [facility] w/ a s/o left flank pain that radiates into her left hip/leg. The pain is constant and sharp. She has a hx of interstitial cystitis and reports recent oliguria. No dysuria or hematuria. Complaints of nausea with the onset of pain. No V/D.   Pt has a medtronic interstim to tx her IC. It is confirmed to be out of place.   130/60 Hr 122 99% RA CBG 306  20 ga IV L AC  500 cc NaCl 100 mcg Fent

## 2019-04-24 ENCOUNTER — Other Ambulatory Visit: Payer: Self-pay | Admitting: Family Medicine

## 2019-04-24 DIAGNOSIS — F411 Generalized anxiety disorder: Secondary | ICD-10-CM

## 2019-04-24 DIAGNOSIS — G8929 Other chronic pain: Secondary | ICD-10-CM

## 2019-04-24 DIAGNOSIS — R519 Headache, unspecified: Secondary | ICD-10-CM

## 2019-04-25 ENCOUNTER — Other Ambulatory Visit: Payer: Self-pay

## 2019-04-25 DIAGNOSIS — G8929 Other chronic pain: Secondary | ICD-10-CM

## 2019-04-25 DIAGNOSIS — M5441 Lumbago with sciatica, right side: Secondary | ICD-10-CM

## 2019-04-25 DIAGNOSIS — R519 Headache, unspecified: Secondary | ICD-10-CM

## 2019-04-25 DIAGNOSIS — F411 Generalized anxiety disorder: Secondary | ICD-10-CM

## 2019-04-25 MED ORDER — HYDROXYZINE HCL 10 MG PO TABS: 10.0000 mg | ORAL_TABLET | Freq: Three times a day (TID) | ORAL | 0 refills | Status: DC | PRN

## 2019-04-25 MED ORDER — IBUPROFEN 600 MG PO TABS: 600.0000 mg | ORAL_TABLET | Freq: Three times a day (TID) | ORAL | 0 refills | Status: DC | PRN

## 2019-04-25 MED FILL — ?IBUPROFEN 600 MG TABS: 600 | 7 days supply | Qty: 20 | Fill #0

## 2019-04-25 MED FILL — hydrOXYzine HCL 10 MG TABS: 10 | 10 days supply | Qty: 30 | Fill #0

## 2019-04-25 NOTE — Telephone Encounter (Signed)
Please fill if appropriate.  

## 2019-04-30 MED FILL — SERTRALINE HCL 50 MG TABLET: 50 | 30 days supply | Qty: 30 | Fill #1

## 2019-04-30 MED FILL — $LANTUS SOLOSTAR 100 UNITS/: 100 | 40 days supply | Qty: 6 | Fill #1

## 2019-04-30 MED FILL — GABAPENTIN 300 MG CAPSULE: 300 | 30 days supply | Qty: 30 | Fill #2

## 2019-04-30 MED FILL — TRUE METRIX TEST STRIP: 30 days supply | Qty: 100 | Fill #2

## 2019-05-04 MED FILL — ?IBUPROFEN 600 MG TABS: 600 | 7 days supply | Qty: 20 | Fill #0

## 2019-05-04 MED FILL — hydrOXYzine HCL 10 MG TABS: 10 | 10 days supply | Qty: 30 | Fill #0

## 2019-05-11 ENCOUNTER — Encounter: Payer: Self-pay | Admitting: Family Medicine

## 2019-05-24 ENCOUNTER — Other Ambulatory Visit: Payer: Self-pay

## 2019-05-24 ENCOUNTER — Telehealth: Payer: Self-pay | Admitting: Family Medicine

## 2019-05-24 ENCOUNTER — Other Ambulatory Visit: Payer: Self-pay | Admitting: Family Medicine

## 2019-05-24 ENCOUNTER — Ambulatory Visit: Payer: Self-pay | Attending: Family Medicine | Admitting: Licensed Clinical Social Worker

## 2019-05-24 DIAGNOSIS — F411 Generalized anxiety disorder: Secondary | ICD-10-CM

## 2019-05-24 DIAGNOSIS — F419 Anxiety disorder, unspecified: Secondary | ICD-10-CM

## 2019-05-24 MED ORDER — HYDROXYZINE HCL 25 MG PO TABS: 25.0000 mg | ORAL_TABLET | Freq: Three times a day (TID) | ORAL | 0 refills | Status: DC | PRN

## 2019-05-24 NOTE — Progress Notes (Signed)
Patient ID: Michelle Gibson, female   DOB: 04/17/1976, 43 y.o.   MRN: [id number]   Message received from social worker that patient with complaint of increased anxiety symptoms and has follow-up appointment with me on 06/04/2019. New RX sent in for hydroxyzine at an increased dose of 25 mg to help with symptoms until follow-up appointment

## 2019-05-24 NOTE — Telephone Encounter (Signed)
New Message   Pt calling, states she is having a really bad panic attack and high anxiety. Please f/u

## 2019-05-25 ENCOUNTER — Telehealth: Payer: Self-pay | Admitting: Licensed Clinical Social Worker

## 2019-05-25 MED FILL — hydrOXYzine HCL 25 MG TABS: 25 | 10 days supply | Qty: 30 | Fill #0

## 2019-05-25 NOTE — Telephone Encounter (Signed)
LCSW placed call to patient to inform her of medications sent to [facility]. A detailed message was left requesting pt to return call.

## 2019-05-25 NOTE — BH Specialist Note (Signed)
LCSW placed call to patient. She reports overwhelming increase in panic attacks that are occurring "constantly everyday" States that she only receives four hours of sleep a night. No chest pains. LCSW discussed grounding interventions to assist with managing panic attacks/anxiety.   Pt requests medication to assist with anxiety symptoms. She has a scheduled appointment on 06/04/2019. LCSW messaged PCP about prescribing medication, per pt request

## 2019-06-04 ENCOUNTER — Telehealth (INDEPENDENT_AMBULATORY_CARE_PROVIDER_SITE_OTHER): Payer: Self-pay | Admitting: Family Medicine

## 2019-06-04 ENCOUNTER — Encounter: Payer: Self-pay | Admitting: Family Medicine

## 2019-06-04 DIAGNOSIS — E1142 Type 2 diabetes mellitus with diabetic polyneuropathy: Secondary | ICD-10-CM

## 2019-06-04 DIAGNOSIS — G4701 Insomnia due to medical condition: Secondary | ICD-10-CM

## 2019-06-04 DIAGNOSIS — E1165 Type 2 diabetes mellitus with hyperglycemia: Secondary | ICD-10-CM

## 2019-06-04 DIAGNOSIS — M2550 Pain in unspecified joint: Secondary | ICD-10-CM

## 2019-06-04 DIAGNOSIS — Z794 Long term (current) use of insulin: Secondary | ICD-10-CM

## 2019-06-04 DIAGNOSIS — Z79899 Other long term (current) drug therapy: Secondary | ICD-10-CM

## 2019-06-04 DIAGNOSIS — G8929 Other chronic pain: Secondary | ICD-10-CM

## 2019-06-04 DIAGNOSIS — F411 Generalized anxiety disorder: Secondary | ICD-10-CM

## 2019-06-04 DIAGNOSIS — F410 Panic disorder [episodic paroxysmal anxiety] without agoraphobia: Secondary | ICD-10-CM

## 2019-06-04 MED ORDER — SERTRALINE HCL 100 MG PO TABS: 100.0000 mg | ORAL_TABLET | Freq: Every day | ORAL | 2 refills | Status: DC

## 2019-06-04 MED ORDER — TRAZODONE HCL 50 MG PO TABS: 25.0000 mg | ORAL_TABLET | Freq: Every evening | ORAL | 3 refills | Status: DC | PRN

## 2019-06-04 MED ORDER — GABAPENTIN 300 MG PO CAPS: 300.0000 mg | ORAL_CAPSULE | Freq: Three times a day (TID) | ORAL | 1 refills | Status: DC

## 2019-06-04 MED ORDER — LANTUS SOLOSTAR 100 UNIT/ML ~~LOC~~ SOPN: PEN_INJECTOR | SUBCUTANEOUS | 3 refills | Status: DC

## 2019-06-04 MED FILL — GABAPENTIN 300 MG CAPSULE: 300 | 30 days supply | Qty: 90 | Fill #0

## 2019-06-04 MED FILL — $LANTUS SOLOSTAR 100 UNITS/: 100 | 29 days supply | Qty: 9 | Fill #0

## 2019-06-04 MED FILL — traZODone HCL 50 MG TABS: 50 | 30 days supply | Qty: 30 | Fill #0

## 2019-06-04 NOTE — Progress Notes (Signed)
Virtual Visit via Video Note  I connected with Michelle Gibson on 06/04/19 at  1:50 PM EDT by a video enabled telemedicine application and verified that I am speaking with the correct person using two identifiers.  Location: Patient: Home Provider: Office at [facility]   I discussed the limitations of evaluation and management by telemedicine and the availability of in person appointments. The patient expressed understanding and agreed to proceed.  History of Present Illness:      43 yo female with diabetes with peripheral neuropathy, multiple joint pain and patient reports that she has had recent onset over the past few weeks of increased anxiety, irritability and difficulty falling sleep and staying asleep. She reports recurrent panic attacks.  She states that panic attacks occur on an almost daily basis with sensation that something bad is about to happen and she has a sensation of increased heart rate, chest tightness and occasional shortness of breath associated with her panic attacks.  She is taking her Zoloft 50 mg at bedtime without any difficulty.  She feels that this helps a little with her overall anxiety.  She believes however that she is having increase panic attacks and irritability due to lack of sleep but she states that she has difficulty falling asleep and difficulty staying asleep for the past few weeks.  She also tends to have some disturbance of sleep secondary to chronic joint pain as well as pain in her feet due to chronic diabetic peripheral neuropathy.  Patient states that her neuropathic pain at night is about a 8 on a 0-to-10 scale.  She does have some decrease in pain with the use of gabapentin.  Pain is sharp and burning/stinging.  Patient also feels as if she has morning stiffness.  She would like a referral to rheumatology in follow-up of her chronic joint pain.        She does not check her blood sugars daily but when she has checked her blood sugars they have been anywhere from  200-300 which usually occurs in the late afternoon/evening.  Morning reports sugars are more likely to be between 1 80-200.  She is currently taking 15 units of Lantus at night.  She does have episodes of increased thirst as well as urinary frequency.   Observations/Objective: Video visit done with the patient.  She did not appear to be in any acute distress.  She did appear to be slightly fatigued.  Patient interacted appropriately.  She did not have any crying or rapid speech.  She did not appear to have any issues with shortness of breath.  Assessment and Plan: 1. Generalized anxiety disorder with panic attacks Patient's dose of Zoloft will be increased from current 50 mg to 100 mg nightly.  Patient will have T4/TSH to look for thyroid disorder as a contributing factor to her panic attacks.  Discussed possibility of sending in a low dose medication to help with acute anxiety attacks as she states that hydroxyzine 25 mg has not been effective but anxiolytics have potential for dependence/addiction and hopefully patient will have decreased panic attacks with increased dose of Zoloft.  Prescription for low-dose trazodone was also given to help with sleep and hopefully if patient gets better sleep she will also feel less anxious and irritable. - Comprehensive metabolic panel; Future - T4 AND TSH; Future -Sertraline (ZOLOFT) 100 mg; take 1 pill (100 mg) at bedtime  2. /7. Uncontrolled type 2 diabetes mellitus with hyperglycemia ([place]) She has been asked to come into the  office for blood work in follow-up of her diabetes including comprehensive metabolic panel, T4 and TSH, hemoglobin A1c and microalbumin/creatinine ratio.  She is encouraged to monitor her blood sugars and keep a blood sugar diary in order to help determine medication changes for better control of her diabetes. - Comprehensive metabolic panel; Future - T4 AND TSH; Future - Hemoglobin A1c; Future - Microalbumin/Creatinine Ratio, Urine;  Future  3. Chronic pain of multiple joints Patient with continued complaint of pain in multiple joints which is been ongoing.  Patient request referral to rheumatology.  Patient will also have arthritis panel at an upcoming visit for labs to help better determine the cause of her joint pain. - Ambulatory referral to Rheumatology - Arthritis Panel; Future  4. Encounter for long-term (current) use of medications Patient will have comprehensive metabolic panel in follow-up of long-term use of medications at her upcoming lab visit. - Comprehensive metabolic panel; Future  5. Diabetic peripheral neuropathy associated with type 2 diabetes mellitus ([place]) Refill provided of patient's gabapentin to help with diabetic peripheral neuropathy associated with type 2 diabetes which has so far been uncontrolled - gabapentin (NEURONTIN) 300 MG capsule; Take 1 capsule (300 mg total) by mouth 3 (three) times daily.  Dispense: 90 capsule; Refill: 1  6. Insomnia due to medical condition Patient with complaint of insomnia and prescription provided for trazodone 50 mg to take one half or 1 pill as needed for sleep.  Will likely not increase his dose due to risk of serotonin syndrome as patient is also on Zoloft - traZODone (DESYREL) 50 MG tablet; Take 0.5-1 tablets (25-50 mg total) by mouth at bedtime as needed for sleep.  Dispense: 30 tablet; Refill: 3  7. Type 2 diabetes mellitus with hyperglycemia, with long-term current use of insulin ([place]) Patient reports that her blood sugars remain elevated.  She reports currently taking 15 units in the evenings and patient is encouraged to increase evening dose to 20 units and patient reports that later during the day her blood sugars are often in the 200s therefore patient will start a.m. dose of 10 units after breakfast. - Insulin Glargine (LANTUS SOLOSTAR) 100 UNIT/ML Solostar Pen; Inject 20 units in the evenings and 10 units in the morning to help control blood sugars   Dispense: 5 pen; Refill: 3  Follow Up Instructions:Return in about 3 weeks (around 06/25/2019) for anxiety/DM in 3-4 weeks and labs 06/05/2019.    I discussed the assessment and treatment plan with the patient. The patient was provided an opportunity to ask questions and all were answered. The patient agreed with the plan and demonstrated an understanding of the instructions.   The patient was advised to call back or seek an in-person evaluation if the symptoms worsen or if the condition fails to improve as anticipated.  I provided 14  minutes of non-face-to-face time during this encounter.   Antony Blackbird, MD

## 2019-06-05 ENCOUNTER — Other Ambulatory Visit: Payer: Self-pay

## 2019-06-05 ENCOUNTER — Ambulatory Visit: Payer: Self-pay | Attending: Family Medicine

## 2019-06-05 DIAGNOSIS — E1165 Type 2 diabetes mellitus with hyperglycemia: Secondary | ICD-10-CM

## 2019-06-05 DIAGNOSIS — G8929 Other chronic pain: Secondary | ICD-10-CM

## 2019-06-05 DIAGNOSIS — F410 Panic disorder [episodic paroxysmal anxiety] without agoraphobia: Secondary | ICD-10-CM

## 2019-06-05 DIAGNOSIS — M2550 Pain in unspecified joint: Secondary | ICD-10-CM

## 2019-06-05 DIAGNOSIS — Z79899 Other long term (current) drug therapy: Secondary | ICD-10-CM

## 2019-06-06 LAB — COMPREHENSIVE METABOLIC PANEL WITH GFR
ALT: 17 IU/L (ref 0–32)
AST: 18 IU/L (ref 0–40)
Albumin/Globulin Ratio: 1.6 (ref 1.2–2.2)
Albumin: 4.7 g/dL (ref 3.8–4.8)
Alkaline Phosphatase: 95 IU/L (ref 39–117)
BUN/Creatinine Ratio: 15 (ref 9–23)
BUN: 14 mg/dL (ref 6–24)
Bilirubin Total: 0.2 mg/dL (ref 0.0–1.2)
CO2: 26 mmol/L (ref 20–29)
Calcium: 9.9 mg/dL (ref 8.7–10.2)
Chloride: 98 mmol/L (ref 96–106)
Creatinine, Ser: 0.94 mg/dL (ref 0.57–1.00)
GFR calc Af Amer: 88 mL/min/1.73
GFR calc non Af Amer: 76 mL/min/1.73
Globulin, Total: 3 g/dL (ref 1.5–4.5)
Glucose: 233 mg/dL — ABNORMAL HIGH (ref 65–99)
Potassium: 4.3 mmol/L (ref 3.5–5.2)
Sodium: 141 mmol/L (ref 134–144)
Total Protein: 7.7 g/dL (ref 6.0–8.5)

## 2019-06-06 LAB — T4 AND TSH
T4, Total: 7.2 ug/dL (ref 4.5–12.0)
TSH: 1.24 u[IU]/mL (ref 0.450–4.500)

## 2019-06-06 LAB — ARTHRITIS PANEL
Anti Nuclear Antibody (ANA): NEGATIVE
Rheumatoid fact SerPl-aCnc: <10 [IU]/mL (ref 0.0–13.9)
Sed Rate: 19 mm/h (ref 0–32)
Uric Acid: 5.1 mg/dL (ref 2.5–7.1)

## 2019-06-06 LAB — MICROALBUMIN / CREATININE URINE RATIO
Creatinine, Urine: 93.3 mg/dL
Microalb/Creat Ratio: 6 mg/g{creat} (ref 0–29)
Microalbumin, Urine: 5.8 ug/mL

## 2019-06-06 LAB — HEMOGLOBIN A1C
Est. average glucose Bld gHb Est-mCnc: 174 mg/dL
Hgb A1c MFr Bld: 7.7 % — ABNORMAL HIGH (ref 4.8–5.6)

## 2019-07-05 ENCOUNTER — Ambulatory Visit: Payer: Medicaid Other | Attending: Family Medicine | Admitting: Family Medicine

## 2019-07-05 ENCOUNTER — Encounter: Payer: Self-pay | Admitting: Family Medicine

## 2019-07-05 ENCOUNTER — Other Ambulatory Visit: Payer: Self-pay

## 2019-07-05 VITALS — BP 113/81 | HR 87 | Temp 98.4°F | Ht 72.0 in | Wt 204.0 lb

## 2019-07-05 DIAGNOSIS — G479 Sleep disorder, unspecified: Secondary | ICD-10-CM

## 2019-07-05 DIAGNOSIS — G8929 Other chronic pain: Secondary | ICD-10-CM

## 2019-07-05 DIAGNOSIS — E1142 Type 2 diabetes mellitus with diabetic polyneuropathy: Secondary | ICD-10-CM

## 2019-07-05 DIAGNOSIS — L659 Nonscarring hair loss, unspecified: Secondary | ICD-10-CM

## 2019-07-05 DIAGNOSIS — Z791 Long term (current) use of non-steroidal anti-inflammatories (NSAID): Secondary | ICD-10-CM

## 2019-07-05 DIAGNOSIS — M2550 Pain in unspecified joint: Secondary | ICD-10-CM

## 2019-07-05 DIAGNOSIS — F411 Generalized anxiety disorder: Secondary | ICD-10-CM

## 2019-07-05 DIAGNOSIS — Z23 Encounter for immunization: Secondary | ICD-10-CM

## 2019-07-05 DIAGNOSIS — E1165 Type 2 diabetes mellitus with hyperglycemia: Secondary | ICD-10-CM

## 2019-07-05 DIAGNOSIS — Z794 Long term (current) use of insulin: Secondary | ICD-10-CM

## 2019-07-05 MED ORDER — PANTOPRAZOLE SODIUM 20 MG PO TBEC: 20.0000 mg | DELAYED_RELEASE_TABLET | Freq: Every day | ORAL | 5 refills | Status: DC

## 2019-07-05 MED ORDER — GABAPENTIN 300 MG PO CAPS: 300.0000 mg | ORAL_CAPSULE | Freq: Four times a day (QID) | ORAL | 4 refills | Status: DC

## 2019-07-05 MED ORDER — LANTUS SOLOSTAR 100 UNIT/ML ~~LOC~~ SOPN: PEN_INJECTOR | SUBCUTANEOUS | 2 refills | Status: DC

## 2019-07-05 MED ORDER — CYCLOBENZAPRINE HCL 5 MG PO TABS: ORAL_TABLET | ORAL | 1 refills | Status: DC

## 2019-07-05 MED FILL — TRUEplus LANCETS 28G MISC: 30 days supply | Qty: 100 | Fill #1

## 2019-07-05 MED FILL — TRUE METRIX TEST STRIP: 30 days supply | Qty: 100 | Fill #3

## 2019-07-05 MED FILL — PANTOPRAZOLE SOD DR 20 MG T: 20 | 30 days supply | Qty: 30 | Fill #0

## 2019-07-05 MED FILL — $LANTUS SOLOSTAR 100 UNITS/: 100 | 34 days supply | Qty: 12 | Fill #0

## 2019-07-05 MED FILL — hydrOXYzine HCL 25 MG TABS: 25 | 10 days supply | Qty: 30 | Fill #0

## 2019-07-05 NOTE — Progress Notes (Signed)
None none  Established Patient Office Visit  Subjective:  Patient ID: Michelle Gibson, female    DOB: 11-21-1975  Age: 43 y.o. MRN: [id number]  CC:  Chief Complaint  Patient presents with  . Alopecia  . Medication Management    need to talk about her Lantus  . Anxiety    need to talk about her anxiety med not working  . muscle stiffness  . Tailbone Pain    HPI Michelle Gibson presents for follow-up of diabetes, anxiety, sleep difficulty as well as multiple joint pain and stiffness.  Patient also reports that within the past few weeks she has also had onset of hair loss.  She states that when she comes to her hair she gets a lot of loose strands coming out.  She also continues to feel achy all over and has back pain.  She states that she often feels very stiff as if her muscles are tight and pulling in her back as well as generalized stiffness with movement.  She reports that recently her dentist gave her a prescription for Flexeril as she states that her dentist also noticed that she was very stiff.  Patient reports that the stiffness tends to last all day.  Back pain is dull and aching and currently does not radiate down either leg.       She continues to take Zoloft 100 mg but continues to have sensation of being easily agitated/irritable.  She also however has not been able to get any restful sleep.  She continues to have difficulty falling asleep as well as difficulty with waking up throughout the night.  She did try the trazodone but this made her feel funny so she stopped taking this medication.  She also tried taking hydroxyzine for anxiety as well sleep but she states that this made her feel groggy but not sleepy and did not really seem to help with anxiety.  She did do counseling in the past and reports that she was told that she had completed her therapy.  She currently is uninsured and is not sure that she could afford to return to have counseling regarding her anxiety.  She is trying to use  the techniques she was taught such as meditation and relaxation.  She denies any thoughts of self-harm and no suicidal thoughts or ideations.        Patient reports that she is currently taking 20 units of Lantus in the mornings and 10 units at night but when she takes her nighttime dose of Lantus, she wakes up in the morning with a headache.  She does not believe that she is having any hypoglycemic episodes overnight.  She feels that her blood sugars are higher in the mornings and then lower by the end of the day.  Blood sugars are still sometimes in the 200s in the morning.  She still occasionally has increased thirst and increased urinary frequency.  She also continues to have some numbness and tingling in her feet and legs which she thinks is due to a combination of radiation of pain from her back at times as well as possibly from her diabetes.  She is currently taking Neurontin 300 mg twice daily but feels that it wears off between doses and she wonders if she might be able to take the medication 4 times per day.  She is currently taking ibuprofen for joint pain and takes ibuprofen several times daily.  She denies any abdominal pain, no blood in the stool or  black stools but she occasionally gets some mild stomach discomfort if she forgets to eat before she takes her ibuprofen.  Past Medical History:  Diagnosis Date  . Asthma   . Diabetes mellitus without complication ([place])    TYPE II  . WPW (Wolff-Parkinson-White syndrome)     Past Surgical History:  Procedure Laterality Date  . ABDOMINAL HYSTERECTOMY    . CARDIAC SURGERY    . CESAREAN SECTION      Social History   Tobacco Use  . Smoking status: Current Some Day Smoker    Packs/day: 0.10    Types: Cigarettes  . Smokeless tobacco: Never Used  Substance Use Topics  . Alcohol use: No  . Drug use: No    Outpatient Medications Prior to Visit  Medication Sig Dispense Refill  . Blood Glucose Monitoring Suppl (TRUE METRIX AIR GLUCOSE  METER) w/Device KIT 1 kit by Does not apply route 2 (two) times daily. 1 kit 0  . glucose blood (TRUE METRIX BLOOD GLUCOSE TEST) test strip Use as instructed to check blood sugars twice daily and as needed 100 each 6  . hydrOXYzine (ATARAX/VISTARIL) 25 MG tablet Take 1 tablet (25 mg total) by mouth 3 (three) times daily as needed for anxiety. 30 tablet 0  . ipratropium-albuterol (DUONEB) 0.5-2.5 (3) MG/3ML SOLN Take 3 mLs by nebulization every 6 (six) hours as needed (wheezing or shortness of breath). 240 mL 1  . metFORMIN (GLUCOPHAGE) 500 MG tablet Take 1 tablet (500 mg total) by mouth 2 (two) times daily with a meal. 60 tablet 6  . mometasone-formoterol (DULERA) 200-5 MCG/ACT AERO Inhale 2 puffs into the lungs 2 (two) times daily. (Patient taking differently: Inhale 2 puffs into the lungs as needed for wheezing or shortness of breath. ) 39 Inhaler 3  . Multiple Vitamins-Calcium (ONE-A-DAY WOMENS PO) Take 1 tablet by mouth daily.    . sertraline (ZOLOFT) 100 MG tablet Take 1 tablet (100 mg total) by mouth at bedtime. 30 tablet 2  . traZODone (DESYREL) 50 MG tablet Take 0.5-1 tablets (25-50 mg total) by mouth at bedtime as needed for sleep. 30 tablet 3  . triamcinolone cream (KENALOG) 0.1 % Apply 1 application topically 2 (two) times daily. 60 g 0  . TRUEPLUS LANCETS 28G MISC Use twice daily when checking blood sugars and as needed 100 each 6  . gabapentin (NEURONTIN) 300 MG capsule Take 1 capsule (300 mg total) by mouth 3 (three) times daily. 90 capsule 1  . Insulin Glargine (LANTUS SOLOSTAR) 100 UNIT/ML Solostar Pen Inject 20 units in the evenings and 10 units in the morning to help control blood sugars 5 pen 3   Facility-Administered Medications Prior to Visit  Medication Dose Route Frequency Provider Last Rate Last Dose  . ipratropium-albuterol (DUONEB) 0.5-2.5 (3) MG/3ML nebulizer solution 3 mL  3 mL Nebulization Q20 Min PRN Fredia Beets R, FNP   3 mL at 11/14/17 1615    Allergies    Allergen Reactions  . Ciprofloxacin Hives  . Flecainide Hives, Nausea And Vomiting and Rash  . Penicillin G Rash    Has patient had a PCN reaction causing immediate rash, facial/tongue/throat swelling, SOB or lightheadedness with hypotension: yes Has patient had a PCN reaction causing severe rash involving mucus membranes or skin necrosis: yes Has patient had a PCN reaction that required hospitalization: no Has patient had a PCN reaction occurring within the last 10 years: no If all of the above answers are "NO", then may proceed with Cephalosporin  use.     ROS Review of Systems  Constitutional: Positive for fatigue. Negative for chills and fever.       Hair loss  HENT: Negative for sore throat and trouble swallowing.   Eyes: Negative for photophobia and visual disturbance.  Respiratory: Negative for cough and shortness of breath.   Cardiovascular: Negative for chest pain, palpitations and leg swelling.  Gastrointestinal: Negative for abdominal pain, blood in stool, constipation, diarrhea and nausea.  Endocrine: Positive for polydipsia and polyuria. Negative for cold intolerance, heat intolerance and polyphagia.       She reports increased thirst and urinary frequency when her blood sugars are elevated  Genitourinary: Negative for dysuria and frequency.  Musculoskeletal: Positive for arthralgias, back pain and myalgias. Negative for joint swelling.  Neurological: Positive for numbness. Negative for dizziness and headaches.  Hematological: Negative for adenopathy. Does not bruise/bleed easily.  Psychiatric/Behavioral: Positive for sleep disturbance. Negative for self-injury and suicidal ideas. The patient is nervous/anxious.       Objective:    Physical Exam  Constitutional: She is oriented to person, place, and time. She appears well-developed and well-nourished.  Neck: Normal range of motion. No JVD present. No thyromegaly present.  Cardiovascular: Normal rate and regular  rhythm.  Pulmonary/Chest: Effort normal and breath sounds normal.  Abdominal: Soft. There is no abdominal tenderness. There is no rebound and no guarding.  Musculoskeletal:        General: Tenderness present. No edema.     Comments: Lumbosacral tenderness to palpation  Lymphadenopathy:    She has no cervical adenopathy.  Neurological: She is alert and oriented to person, place, and time.  Skin: Skin is warm and dry.  Psychiatric: She has a normal mood and affect. Her behavior is normal.  Nursing note and vitals reviewed.   BP 113/81 (BP Location: Left Arm, Patient Position: Sitting, Cuff Size: Normal)   Pulse 87   Temp 98.4 F (36.9 C) (Oral)   Ht 6' (1.829 m)   Wt 204 lb (92.5 kg)   SpO2 95%   BMI 27.67 kg/m  Wt Readings from Last 3 Encounters:  07/05/19 204 lb (92.5 kg)  03/17/19 198 lb (89.8 kg)  02/01/19 196 lb 12.8 oz (89.3 kg)     Health Maintenance Due  Topic Date Due  . HIV Screening  06/27/1993  . PAP SMEAR-Modifier  06/28/1999  . INFLUENZA VACCINE  05/12/2019    Lab Results  Component Value Date   TSH 1.240 06/05/2019   Lab Results  Component Value Date   WBC 8.5 03/17/2019   HGB 12.9 03/17/2019   HCT 39.5 03/17/2019   MCV 90.8 03/17/2019   PLT 341 03/17/2019   Lab Results  Component Value Date   NA 141 06/05/2019   K 4.3 06/05/2019   CO2 26 06/05/2019   GLUCOSE 233 (H) 06/05/2019   BUN 14 06/05/2019   CREATININE 0.94 06/05/2019   BILITOT 0.2 06/05/2019   ALKPHOS 95 06/05/2019   AST 18 06/05/2019   ALT 17 06/05/2019   PROT 7.7 06/05/2019   ALBUMIN 4.7 06/05/2019   CALCIUM 9.9 06/05/2019   ANIONGAP 10 03/17/2019   No results found for: CHOL No results found for: HDL No results found for: LDLCALC No results found for: TRIG No results found for: CHOLHDL Lab Results  Component Value Date   HGBA1C 7.7 (H) 06/05/2019      Assessment & Plan:  1. Diabetic peripheral neuropathy associated with type 2 diabetes mellitus [facility]) Patient is  provided with refill of gabapentin and may take the medication 4 times daily.  This is the maximum that doses can be extended.  Hopefully this will help since patient feels as if the effects wore off between doses with her current 3 times daily dosing. - gabapentin (NEURONTIN) 300 MG capsule; Take 1 capsule (300 mg total) by mouth 4 (four) times daily.  Dispense: 120 capsule; Refill: 4  2. Generalized anxiety disorder Discussed increasing the dose of patient's sertraline but she did not wish to do that at this time.  Discussed with patient that perhaps if her ability to sleep is improved that this may help with her anxiety and irritability.  Initially discussed medication such as zolpidem however patient is also having issues with muscle stiffness and has taken Flexeril in the past therefore we will add Flexeril for muscle spasm which should also help improve sleep and patient should call or return sooner if she is not feeling that her anxiety improves after improvement in sleep.  Also discussed possibility of counseling but patient is not sure that she can afford counseling at this time due to her lack of insurance.  3. Type 2 diabetes mellitus with hyperglycemia, with long-term current use of insulin ([place]) Patient with recent blood work prior to today's exam and patient stable hemoglobin A1c of 7.7 on 06/05/2019 as hemoglobin A1c had been 7.6 on 02/01/2019.  She prefers to remain on insulin therapy but states that she tends to wake up in the mornings with a headache after taking her nighttime dose of Lantus.  Patient had initial preference of splinting the dose but agrees to try 35 units once daily in the morning as she states that then she would be able to better monitor her blood sugars throughout the day.  Patient is aware of goal A1c of 7 or less along with fasting blood sugars of 120 or less and postprandial blood sugars between 1 40-1 60 within 2 hours of a meal - Insulin Glargine (LANTUS SOLOSTAR) 100  UNIT/ML Solostar Pen; Inject 35 units once per day  Dispense: 5 pen; Refill: 2  4. Chronic pain of multiple joints Patient with complaint of chronic pain and stiffness of multiple joints as well as peripheral neuropathy.  Gabapentin is being increased from 3 times daily to 4 times daily along with addition of Flexeril 5 mg twice daily as needed and she may take up to 2 at bedtime (10 mg) which should help not only with stiffness but also help with sleep.  Patient may continue to take ibuprofen as needed for pain but should not exceed more than 800 mg every 8 hours.  She should also make sure that she eats prior to taking the medication and notify the office if she has any persistent abdominal pain and seek medical attention for any blood in the stool or black stools.  She has had recent normal arthritis panel.  Suggested that patient make sure that she apply for the financial assistance program here in the office as she may also benefit from rheumatology referral for further evaluation of her stiffness and joint pain. - gabapentin (NEURONTIN) 300 MG capsule; Take 1 capsule (300 mg total) by mouth 4 (four) times daily.  Dispense: 120 capsule; Refill: 4 - cyclobenzaprine (FLEXERIL) 5 MG tablet; Take 1 tablet twice per day and 1-2 at bedtime as needed for muscle spasm  Dispense: 90 tablet; Refill: 1  5. Sleep disturbance Patient reports difficulty falling asleep as well as difficulty staying asleep  and reports that she was not able to tolerate trazodone.  Discussed possibility of having patient try Ambien for sleep at 5 mg however patient also reports that she has muscle stiffness and has been prescribed Flexeril in the past which helped.  Discussed with the patient that prescription will be provided for Flexeril 5 mg twice daily and she can take 10 mg at bedtime which should help with initiation of sleep and hopefully improvement in sleep which will then hopefully also lead to improvement in irritability and  improve mood  6. Hair loss Patient with complaint of recent onset of hair loss.  Discussed with patient that this could occur for several reasons including stressful events that may have happened up to 3 months ago may now result in shedding of the hair, anemia or dietary deficiencies and with some medical disorders and hormonal changes.  Patient did have complete blood count in June which did not show any evidence of anemia.  Due to the current COVID 19 pandemic she has had increased anxiety which may be a factor.  She did have recent T4 and TSH done 06/05/2019 which were normal therefore thyroid disorder is likely not contributing.  She also had a normal arthritis panel recently.  Patient may wish to increase/take a biotin supplement and may need further follow-up if hair loss continues.  7. Long term (current) use of non-steroidal anti-inflammatories ([place]) Pantoprazole 20 mg once daily added secondary to patient's long-term use of ibuprofen for treatment of joint pain and patient with occasional stomach upset.  Patient should also make sure that she eats prior to taking ibuprofen and patient made aware of increased risk of gastritis/GI bleed due to ulcers with long-term use of nonsteroidal anti-inflammatories as well as potential injury to the kidney and increased risk of cardiovascular disease. - pantoprazole (PROTONIX) 20 MG tablet; Take 1 tablet (20 mg total) by mouth daily. To reduce stomach acid  Dispense: 30 tablet; Refill: 5  - Need for immunization against influenza Patient was offered and received influenza immunization as nurse visit while she was here at today's visit. - Flu Vaccine QUAD 6+ mos PF IM (Fluarix Quad PF)   Meds ordered this encounter  Medications  . gabapentin (NEURONTIN) 300 MG capsule    Sig: Take 1 capsule (300 mg total) by mouth 4 (four) times daily.    Dispense:  120 capsule    Refill:  4  . cyclobenzaprine (FLEXERIL) 5 MG tablet    Sig: Take 1 tablet twice per  day and 1-2 at bedtime as needed for muscle spasm    Dispense:  90 tablet    Refill:  1  . Insulin Glargine (LANTUS SOLOSTAR) 100 UNIT/ML Solostar Pen    Sig: Inject 35 units once per day    Dispense:  5 pen    Refill:  2    Dispense pen needles; does not need filled yet but dose changed  . pantoprazole (PROTONIX) 20 MG tablet    Sig: Take 1 tablet (20 mg total) by mouth daily. To reduce stomach acid    Dispense:  30 tablet    Refill:  5   An After Visit Summary was printed and given to the patient.  Follow-up: Return in about 3 months (around 10/04/2019) for medication changes/chronic issues.    Antony Blackbird, MD

## 2019-07-06 MED FILL — GABAPENTIN 300 MG CAPSULE: 300 | 30 days supply | Qty: 120 | Fill #0

## 2019-07-06 MED FILL — CYCLOBENZAPRINE 5 MG TABLET: 5 | 30 days supply | Qty: 90 | Fill #0

## 2019-07-11 ENCOUNTER — Telehealth: Payer: Self-pay | Admitting: Family Medicine

## 2019-07-11 NOTE — Telephone Encounter (Signed)
Patient called stating she would like to be prescribed prednisone for her asthma.states she is having breathing issues Please follow up.   Advised to go to the ED or UC if needing to get seen soon

## 2019-07-12 ENCOUNTER — Other Ambulatory Visit: Payer: Self-pay | Admitting: Family Medicine

## 2019-07-12 ENCOUNTER — Telehealth: Payer: Self-pay | Admitting: Family Medicine

## 2019-07-12 ENCOUNTER — Encounter: Payer: Self-pay | Admitting: Family Medicine

## 2019-07-12 DIAGNOSIS — R21 Rash and other nonspecific skin eruption: Secondary | ICD-10-CM

## 2019-07-12 NOTE — Telephone Encounter (Signed)
Informed patient with what provider stated. Per pt she have not gone to urgent care or emergency room as advised yet. Per pt she have people in her house and can not leave at the moment. Per pt she will go there though when she can.

## 2019-07-12 NOTE — Telephone Encounter (Signed)
I think that this patient left a message earlier that she was having a fever and an increased heart rate and follow-up of urgent care or ED was suggested

## 2019-07-12 NOTE — Telephone Encounter (Signed)
Pt called Covid line. States remains SOB, temp. 99.9, body aches, states HR 140s today, "Feels fast now, like a rush."  Advised UC/ED as noted by Dr. Chapman Fitch earlier today. Pt verbalizes understanding. States will follow disposition.

## 2019-07-13 ENCOUNTER — Other Ambulatory Visit: Payer: Self-pay

## 2019-07-13 ENCOUNTER — Ambulatory Visit
Admission: EM | Admit: 2019-07-13 | Discharge: 2019-07-13 | Disposition: A | Discharge status: Home / Self Care | Payer: Medicaid Other | Attending: Physician Assistant | Admitting: Physician Assistant

## 2019-07-13 DIAGNOSIS — Z1159 Encounter for screening for other viral diseases: Secondary | ICD-10-CM

## 2019-07-13 DIAGNOSIS — R0602 Shortness of breath: Secondary | ICD-10-CM

## 2019-07-13 DIAGNOSIS — R0789 Other chest pain: Secondary | ICD-10-CM

## 2019-07-13 MED ORDER — ALBUTEROL SULFATE (2.5 MG/3ML) 0.083% IN NEBU: 2.5000 mg | INHALATION_SOLUTION | Freq: Four times a day (QID) | RESPIRATORY_TRACT | 0 refills | Status: DC | PRN

## 2019-07-13 MED ORDER — PREDNISONE 50 MG PO TABS: 50.0000 mg | ORAL_TABLET | Freq: Every day | ORAL | 0 refills | Status: DC

## 2019-07-13 MED FILL — predniSONE 10 MG TABS: 10 | 5 days supply | Qty: 25 | Fill #0

## 2019-07-13 MED FILL — ALBUTEROL SUL 2.5 MG/3 ML S: (2.5 MG/3ML | 6 days supply | Qty: 75 | Fill #0

## 2019-07-13 NOTE — ED Triage Notes (Signed)
Pt presents to UC w/ c/o headache, "lungs hurting", body aches x2 days. Pt reports taking flu shot 1 week ago.

## 2019-07-13 NOTE — ED Provider Notes (Signed)
[name]    CSN: [id number] Arrival date & time: 07/13/19  1222      History   Chief Complaint No chief complaint on file.   HPI Michelle Gibson is a 43 y.o. female.   43 year old female with history of asthma, insulin-dependent type 2 diabetes, WPW comes in for 2-day history of headache, body aches, pleuritic chest pain, shortness of breath/wheezing.  Denies rhinorrhea, nasal congestion, cough.  Denies fever, chills.  Denies abdominal pain, nausea, vomiting, diarrhea.  Denies loss of taste or smell.  Describes pleuritic chest pain as "burning to the lungs", "lung is inflamed". Shortness of breath and pleuritic chest pain is relieved temporarily with albuterol.  Not working at this time, no obvious sick or COVID exposure.  Patient's last A1c 7.7. Flu shot 1 week ago.      Past Medical History:  Diagnosis Date  . Asthma   . Diabetes mellitus without complication ([place])    TYPE II  . WPW (Wolff-Parkinson-White syndrome)     Patient Active Problem List   Diagnosis Date Noted  . Fever and chills 11/17/2018  . Closed fracture of upper end of fibula 09/21/2018  . WPW (Wolff-Parkinson-White syndrome) 10/10/2017  . Asthma with acute exacerbation 09/15/2016  . Hyperglycemia 09/15/2016  . History of Wolff-Parkinson-White (WPW) syndrome 09/15/2016  . Acute respiratory failure with hypoxia ([place]) 09/15/2016  . Bronchospasm 09/11/2016    Past Surgical History:  Procedure Laterality Date  . ABDOMINAL HYSTERECTOMY    . CARDIAC SURGERY    . CESAREAN SECTION      OB History   No obstetric history on file.      Home Medications    Prior to Admission medications   Medication Sig Start Date End Date Taking? Authorizing Provider  albuterol (PROVENTIL) (2.5 MG/3ML) 0.083% nebulizer solution Take 3 mLs (2.5 mg total) by nebulization every 6 (six) hours as needed for wheezing or shortness of breath. 07/13/19   Tasia Catchings, Amy V, PA-C  Blood Glucose Monitoring Suppl (TRUE METRIX  AIR GLUCOSE METER) w/Device KIT 1 kit by Does not apply route 2 (two) times daily. 09/19/18   Fulp, Cammie, MD  cyclobenzaprine (FLEXERIL) 5 MG tablet Take 1 tablet twice per day and 1-2 at bedtime as needed for muscle spasm 07/05/19   Fulp, Cammie, MD  gabapentin (NEURONTIN) 300 MG capsule Take 1 capsule (300 mg total) by mouth 4 (four) times daily. 07/05/19   Fulp, Cammie, MD  glucose blood (TRUE METRIX BLOOD GLUCOSE TEST) test strip Use as instructed to check blood sugars twice daily and as needed 09/19/18   Fulp, Cammie, MD  hydrOXYzine (ATARAX/VISTARIL) 25 MG tablet Take 1 tablet (25 mg total) by mouth 3 (three) times daily as needed for anxiety. 05/24/19   Fulp, Cammie, MD  Insulin Glargine (LANTUS SOLOSTAR) 100 UNIT/ML Solostar Pen Inject 35 units once per day 07/05/19   Fulp, Cammie, MD  ipratropium-albuterol (DUONEB) 0.5-2.5 (3) MG/3ML SOLN Take 3 mLs by nebulization every 6 (six) hours as needed (wheezing or shortness of breath). 11/14/17   Alfonse Spruce, FNP  mometasone-formoterol (DULERA) 200-5 MCG/ACT AERO Inhale 2 puffs into the lungs 2 (two) times daily. Patient taking differently: Inhale 2 puffs into the lungs as needed for wheezing or shortness of breath.  10/17/17   Alfonse Spruce, FNP  Multiple Vitamins-Calcium (ONE-A-DAY WOMENS PO) Take 1 tablet by mouth daily.    [provider]  pantoprazole (PROTONIX) 20 MG tablet Take 1 tablet (20 mg total) by mouth daily.  To reduce stomach acid 07/05/19   Fulp, Cammie, MD  predniSONE (DELTASONE) 50 MG tablet Take 1 tablet (50 mg total) by mouth daily with breakfast. 07/13/19   Tasia Catchings, Amy V, PA-C  sertraline (ZOLOFT) 100 MG tablet Take 1 tablet (100 mg total) by mouth at bedtime. 06/04/19   Fulp, Ander Gaster, MD  TRUEPLUS LANCETS 28G MISC Use twice daily when checking blood sugars and as needed 09/19/18   Fulp, Cammie, MD  metFORMIN (GLUCOPHAGE) 500 MG tablet Take 1 tablet (500 mg total) by mouth 2 (two) times daily with a meal. 08/23/18  07/13/19  Fulp, Cammie, MD  traZODone (DESYREL) 50 MG tablet Take 0.5-1 tablets (25-50 mg total) by mouth at bedtime as needed for sleep. 06/04/19 07/13/19  Antony Blackbird, MD    Family History History reviewed. No pertinent family history.  Social History Social History   Tobacco Use  . Smoking status: Former Smoker    Packs/day: 0.10    Types: Cigarettes  . Smokeless tobacco: Never Used  Substance Use Topics  . Alcohol use: No  . Drug use: No     Allergies   Ciprofloxacin, Flecainide, and Penicillin g   Review of Systems Review of Systems  Reason unable to perform ROS: See HPI as above.     Physical Exam Triage Vital Signs ED Triage Vitals  Enc Vitals Group     BP 07/13/19 1255 135/83     Pulse Rate 07/13/19 1255 80     Resp 07/13/19 1255 16     Temp 07/13/19 1255 98.3 F (36.8 C)     Temp Source 07/13/19 1255 Oral     SpO2 07/13/19 1255 97 %     Weight --      Height --      Head Circumference --      Peak Flow --      Pain Score 07/13/19 1258 3     Pain Loc --      Pain Edu? --      Excl. in [place]? --    No data found.  Updated Vital Signs BP 135/83 (BP Location: Left Arm)   Pulse 80   Temp 98.3 F (36.8 C) (Oral)   Resp 16   SpO2 97%   Physical Exam Constitutional:      General: She is not in acute distress.    Appearance: Normal appearance. She is not ill-appearing, toxic-appearing or diaphoretic.  HENT:     Head: Normocephalic and atraumatic.     Mouth/Throat:     Mouth: Mucous membranes are moist.     Pharynx: Oropharynx is clear. Uvula midline.  Neck:     Musculoskeletal: Normal range of motion and neck supple.  Cardiovascular:     Rate and Rhythm: Normal rate and regular rhythm.     Heart sounds: Normal heart sounds. No murmur. No friction rub. No gallop.   Pulmonary:     Effort: Pulmonary effort is normal. No accessory muscle usage, prolonged expiration, respiratory distress or retractions.     Comments: Patient speaking in full  sentences without difficulty.  Lungs clear to auscultation without adventitious lung sounds.  No tenderness to palpation of the chest. Neurological:     General: No focal deficit present.     Mental Status: She is alert and oriented to person, place, and time.      UC Treatments / Results  Labs (all labs ordered are listed, but only abnormal results are displayed) Labs Reviewed  NOVEL CORONAVIRUS, NAA  EKG   Radiology No results found.  Procedures Procedures (including critical care time)  Medications Ordered in UC Medications - No data to display  Initial Impression / Assessment and Plan / UC Course  I have reviewed the triage vital signs and the nursing notes.  Pertinent labs & imaging results that were available during my care of the patient were reviewed by me and considered in my medical decision making (see chart for details).     EKG normal sinus rhythm, 83 bpm, acute ST changes, no obvious changes from prior EKG.  Patient can speak in full sentences, lungs clear to auscultation bilaterally without adventitious lung sounds.  She is without tachycardia or tachypnea, O2 sat 97% at room air.  Discussed possible asthma exacerbation causing symptoms, COVID test ordered.  Patient to remain in quarantine until testing results return.  Will have patient start with albuterol inhaler, will refill albuterol nebulizer for further management needed.  Patient to discuss prednisone use with PCP given uncontrolled diabetes.  Strict return precautions given.  Patient expresses understanding and agrees to plan.  Final Clinical Impressions(s) / UC Diagnoses   Final diagnoses:  Shortness of breath  Chest tightness   ED Prescriptions    Medication Sig Dispense Auth. Provider   albuterol (PROVENTIL) (2.5 MG/3ML) 0.083% nebulizer solution Take 3 mLs (2.5 mg total) by nebulization every 6 (six) hours as needed for wheezing or shortness of breath. 75 mL Yu, Amy V, PA-C   predniSONE  (DELTASONE) 50 MG tablet Take 1 tablet (50 mg total) by mouth daily with breakfast. 5 tablet Ok Edwards, PA-C     PDMP not reviewed this encounter.   Ok Edwards, PA-C 07/13/19 1359

## 2019-07-13 NOTE — Discharge Instructions (Signed)
As discussed, cannot rule out COVID. Currently, no alarming signs. Testing ordered. I would like you to quarantine until testing results. Albuterol as needed for shortness of breath/wheezing. If needed, can fill prednisone as directed. As discussed, will need to discuss blood sugar control with your PCP prior to starting prednisone. If experiencing worsening shortness of breath, trouble breathing, call 911 and provide them with your current situation.

## 2019-07-14 LAB — NOVEL CORONAVIRUS, NAA: SARS-CoV-2, NAA: NOT DETECTED

## 2019-07-16 ENCOUNTER — Encounter (HOSPITAL_COMMUNITY): Payer: Self-pay

## 2019-08-02 ENCOUNTER — Other Ambulatory Visit: Payer: Self-pay

## 2019-08-02 ENCOUNTER — Ambulatory Visit: Payer: Self-pay | Attending: Family Medicine | Admitting: Physician Assistant

## 2019-08-02 DIAGNOSIS — F411 Generalized anxiety disorder: Secondary | ICD-10-CM

## 2019-08-02 DIAGNOSIS — M25561 Pain in right knee: Secondary | ICD-10-CM

## 2019-08-02 MED ORDER — IBUPROFEN 800 MG PO TABS: 800.0000 mg | ORAL_TABLET | Freq: Three times a day (TID) | ORAL | 0 refills | Status: DC | PRN

## 2019-08-02 MED ORDER — METHOCARBAMOL 500 MG PO TABS: 1000.0000 mg | ORAL_TABLET | Freq: Three times a day (TID) | ORAL | 1 refills | Status: DC | PRN

## 2019-08-02 MED FILL — ?IBUPROFEN 800 MG TABS: AMNEAL | 20 days supply | Qty: 60 | Fill #0

## 2019-08-02 MED FILL — METHOCARBAMOL 500 MG TABS: 500 | 15 days supply | Qty: 90 | Fill #0

## 2019-08-02 NOTE — Progress Notes (Signed)
Virtual Visit via Telephone Note  I connected with Michelle Gibson on 08/02/19 at  2:10 PM EDT by telephone and verified that I am speaking with the correct person using two identifiers.   I discussed the limitations, risks, security and privacy concerns of performing an evaluation and management service by telephone and the availability of in person appointments. I also discussed with the patient that there may be a patient responsible charge related to this service. The patient expressed understanding and agreed to proceed.  Patient location: home My Location:  home office Persons on the call:  Me and the patient   History of Present Illness:  Patient had a fall and twisted her R knee(see note below)  This occurred on 07/28/2019.  She heard a loud pop when it occurred.  She is on crutches and in a knee immobilizer.  +swelling.  No erythema.  Very painful.  Blood sugars running 150-220s  Also having increase in anxiety and depression bc she is the sole care giver in her family and she is unable to work.  Says zoloft and vistaril do help some.  She says sometimes she "wish I wasn't around." but, she denies any plan or intention of harming herself.    From ED 07/28/2019: 43 y.o. female who presents to the ED with knee pain. Exam as noted above. No neurovascular compromise. No alarming exam findings. Xray as noted above with no acute fracture identified. Patient was placed in a knee immobilizer and provided with crutches, counseling performed. Patient was provided with anti-inflammatories as well as ortho referral. Patient is from out of town and was advised to follow-up with her primary care provider and establish an orthopedic physician when she returns to her home town in [place]. I will leave an orthopedic referral attached to her paperwork within the [facility] regional system just in case she decides to follow-up next week. Discussed RICE. Stable for discharge home. Verbalizes understanding  and agreement with POC  Xray reading: Result Date: 07/29/2019 Narrative: Exam: Right knee, 4 views INDICATION: Knee pain following injury FINDINGS: There is no fracture, dislocation, or other osseous abnormality.     Observations/Objective:  Sounds tearful.  A&Ox3   Assessment and Plan: 1. Right knee pain and swelling with suspicion of ACL tear per ED note.  RICE therapy.  Continue crutches and knee immobilizer - methocarbamol (ROBAXIN) 500 MG tablet; Take 2 tablets (1,000 mg total) by mouth every 8 (eight) hours as needed for muscle spasms.  Dispense: 90 tablet; Refill: 1 - ibuprofen (ADVIL) 800 MG tablet; Take 1 tablet (800 mg total) by mouth every 8 (eight) hours as needed.  Dispense: 60 tablet; Refill: 0 - Ambulatory referral to Orthopedic Surgery  2. Generalized anxiety disorder Self care, proper rest and nutrition counseling.  appt for orange card/ginancial assistance next week.  Continue zoloft and vistaril - Ambulatory referral to Social Work    Follow Up Instructions: 10/03/2019 appt with PCP   I discussed the assessment and treatment plan with the patient. The patient was provided an opportunity to ask questions and all were answered. The patient agreed with the plan and demonstrated an understanding of the instructions.   The patient was advised to call back or seek an in-person evaluation if the symptoms worsen or if the condition fails to improve as anticipated.  I provided 15 minutes of non-face-to-face time during this encounter.   Freeman Caldron, PA-C  Patient ID: Michelle Gibson, female   DOB: Mar 27, 1976, 42 y.o.  MRN: [id number]

## 2019-08-02 NOTE — Progress Notes (Signed)
Torn ACL and anxiety   Per pt she has been taking hydroxyzine  Referral to Psych.

## 2019-08-03 MED FILL — GABAPENTIN 300 MG CAPSULE: 300 | 30 days supply | Qty: 120 | Fill #1

## 2019-08-09 ENCOUNTER — Encounter: Payer: Self-pay | Admitting: Orthopaedic Surgery

## 2019-08-09 ENCOUNTER — Other Ambulatory Visit: Payer: Self-pay

## 2019-08-09 ENCOUNTER — Ambulatory Visit (INDEPENDENT_AMBULATORY_CARE_PROVIDER_SITE_OTHER): Payer: Self-pay

## 2019-08-09 ENCOUNTER — Ambulatory Visit (INDEPENDENT_AMBULATORY_CARE_PROVIDER_SITE_OTHER): Payer: Self-pay | Admitting: Orthopaedic Surgery

## 2019-08-09 DIAGNOSIS — M25561 Pain in right knee: Secondary | ICD-10-CM

## 2019-08-09 MED ORDER — TRAMADOL HCL 50 MG PO TABS: 50.0000 mg | ORAL_TABLET | Freq: Four times a day (QID) | ORAL | 0 refills | Status: DC | PRN

## 2019-08-09 MED FILL — traMADol HCL 50 MG TABS: 50 | 7 days supply | Qty: 30 | Fill #0

## 2019-08-09 NOTE — Progress Notes (Signed)
Office Visit Note   Patient: Michelle Gibson           Date of Birth: Feb 18, 1976           MRN: [id number] Visit Date: 08/09/2019              Requested by: Argentina Donovan, PA-C [place],  [place] [id number] PCP: Antony Blackbird, MD   Assessment & Plan: Visit Diagnoses:  1. Acute pain of right knee     Plan: Impression is right knee medial meniscus tear.  The patient does note that she has a nerve stimulator and is unable to have MRIs. We will refer her to Dr. Junius Roads for right knee ultrasound.  Follow up with [place] as needed.  Follow-Up Instructions: Return for after CT scan.   Orders:  Orders Placed This Encounter  Procedures  . XR KNEE 3 VIEW RIGHT   No orders of the defined types were placed in this encounter.     Procedures: No procedures performed   Clinical Data: No additional findings.   Subjective: Chief Complaint  Patient presents with  . Right Knee - Pain    HPI patient is a pleasant 43 year old female who presents our clinic today with right knee pain.  This began approximately 2 weeks ago without any known injury.  She notes that she was swaying back and forth while standing and all of a sudden she felt a pop and had immediate pain to the anteromedial knee.  The pain has worsened over the past week.  Pain is aggravated with ambulation as well as when she pivots or squats.  She notes locking and catching, no instability.  She has tried NSAIDs and muscle relaxers without significant relief of symptoms.  No numbness, tingling or burning.  She does note history of a proximal fibula fracture last year, but no other problems with the right knee.  No recent cortisone injection.   Review of Systems as detailed in HPI.  All others reviewed and are negative.   Objective: Vital Signs: There were no vitals taken for this visit.  Physical Exam well-developed well-nourished female no acute distress.  Alert and oriented x3.  Ortho Exam examination of her right  knee reveals a trace effusion.  Range of motion 10 to 85 degrees.  She has marked tenderness to the anteromedial joint line.  Cruciates and collaterals are stable.  She is neurovascularly intact distally.  Specialty Comments:  No specialty comments available.  Imaging: Xr Knee 3 View Right  Result Date: 08/09/2019 Decreased joint space medial compartment    PMFS History: Patient Active Problem List   Diagnosis Date Noted  . Fever and chills 11/17/2018  . Closed fracture of upper end of fibula 09/21/2018  . WPW (Wolff-Parkinson-White syndrome) 10/10/2017  . Asthma with acute exacerbation 09/15/2016  . Hyperglycemia 09/15/2016  . History of Wolff-Parkinson-White (WPW) syndrome 09/15/2016  . Acute respiratory failure with hypoxia ([place]) 09/15/2016  . Bronchospasm 09/11/2016   Past Medical History:  Diagnosis Date  . Asthma   . Diabetes mellitus without complication ([place])    TYPE II  . WPW (Wolff-Parkinson-White syndrome)     History reviewed. No pertinent family history.  Past Surgical History:  Procedure Laterality Date  . ABDOMINAL HYSTERECTOMY    . CARDIAC SURGERY    . CESAREAN SECTION     Social History   Occupational History  . Not on file  Tobacco Use  . Smoking status: Former Smoker  Packs/day: 0.10    Types: Cigarettes  . Smokeless tobacco: Never Used  Substance and Sexual Activity  . Alcohol use: No  . Drug use: No  . Sexual activity: Yes

## 2019-08-10 ENCOUNTER — Ambulatory Visit: Payer: Medicaid Other

## 2019-08-13 ENCOUNTER — Telehealth: Payer: Self-pay | Admitting: Licensed Clinical Social Worker

## 2019-08-13 NOTE — Telephone Encounter (Signed)
Call placed to patient regarding [facility] referral. LCSW left message requesting a return call.  

## 2019-08-14 ENCOUNTER — Other Ambulatory Visit: Payer: Self-pay

## 2019-08-14 ENCOUNTER — Encounter: Payer: Self-pay | Admitting: Family Medicine

## 2019-08-14 ENCOUNTER — Other Ambulatory Visit: Payer: Self-pay | Admitting: Family Medicine

## 2019-08-14 ENCOUNTER — Ambulatory Visit (INDEPENDENT_AMBULATORY_CARE_PROVIDER_SITE_OTHER): Payer: Medicaid Other | Admitting: Family Medicine

## 2019-08-14 ENCOUNTER — Ambulatory Visit: Payer: Self-pay

## 2019-08-14 DIAGNOSIS — M25561 Pain in right knee: Secondary | ICD-10-CM

## 2019-08-14 DIAGNOSIS — F411 Generalized anxiety disorder: Secondary | ICD-10-CM

## 2019-08-14 NOTE — Progress Notes (Signed)
Michelle Gibson - 43 y.o. female MRN [id number]  Date of birth: 1976-06-17  Office Visit Note: Visit Date: 08/14/2019 PCP: Antony Blackbird, MD Referred by: Antony Blackbird, MD  Subjective: Chief Complaint  Patient presents with  . Right Knee - Pain    Ultrasound per Dr. Phoebe Sharps request.   HPI: Michelle Gibson is a 43 y.o. female who comes in today as a referral from Dr. Erlinda Hong for ultrasound of right knee. She has  history and physical concerning for meniscal tear but cannot obtain MRI due to nerve stimulator in place. She reports that injury occurred 3 weeks ago; she was swaying back and forth and felt a pop and immediate pain in medial knee. Since that time, she has been limping. She feels locking and catching with movement. She has been taking ibuprofen and icing occasionally.    ROS Otherwise per HPI.  Assessment & Plan: Visit Diagnoses:  1. Acute pain of right knee     Able to visualize medial meniscus tear with ultrasound. Discussed with Dr. Erlinda Hong, who will have patient return to see him to discuss surgical options.   Meds & Orders: No orders of the defined types were placed in this encounter.   Orders Placed This Encounter  Procedures  . US Guided Needle Placement    Follow-up: Return for To see Dr. Erlinda Hong..   Procedures: No procedures performed  No notes on file   Clinical History: No specialty comments available.   She reports that she has quit smoking. Her smoking use included cigarettes. She smoked 0.10 packs per day. She has never used smokeless tobacco.  Recent Labs    09/19/18 1616 02/01/19 1420 06/05/19 1451  HGBA1C 6.9 7.6* 7.7*  LABURIC  --   --  5.1    Objective:  VS:  HT:    WT:   BMI:     BP:   HR: bpm  TEMP: ( )  RESP:  Physical Exam  PHYSICAL EXAM: Gen: NAD, alert, cooperative with exam, well-appearing HEENT: clear conjunctiva,  CV:  no edema, capillary refill brisk, normal rate Resp: non-labored Skin: no rashes, normal turgor  Neuro: no gross deficits.   Psych:  alert and oriented  Ortho Exam  Knee: - Inspection: 1+ effusion noted - Palpation: TTP along medial joint line with point of maximal tenderness anterior-medial - ROM: Reduced flexion due to pain, full extension in knee and hip - Strength: 5/5 strength - Neuro/vasc: NV intact - Special Tests: - LIGAMENTS: negative anterior and posterior drawer, no MCL or LCL laxity  -- MENISCUS: positive McMurray's, positive [place]ssaly  -- PF JOINT: nml patellar mobility bilaterally.    Imaging: MSK ultrasound knee: Images were obtained both in the transverse and longitudinal plane. Patellar and quadriceps tendons were well visualized with no abnormalities. No effusion. Medial meniscus were well visualized with an anterior tear noted and peri-meniscal swelling. Meniscus is bulging above joint line.    Past Medical/Family/Surgical/Social History: Medications & Allergies reviewed per EMR, new medications updated. Patient Active Problem List   Diagnosis Date Noted  . Fever and chills 11/17/2018  . Closed fracture of upper end of fibula 09/21/2018  . WPW (Wolff-Parkinson-White syndrome) 10/10/2017  . Asthma with acute exacerbation 09/15/2016  . Hyperglycemia 09/15/2016  . History of Wolff-Parkinson-White (WPW) syndrome 09/15/2016  . Acute respiratory failure with hypoxia ([place]) 09/15/2016  . Bronchospasm 09/11/2016   Past Medical History:  Diagnosis Date  . Asthma   . Diabetes mellitus without complication ([place])    TYPE  II  . WPW (Wolff-Parkinson-White syndrome)    History reviewed. No pertinent family history. Past Surgical History:  Procedure Laterality Date  . ABDOMINAL HYSTERECTOMY    . CARDIAC SURGERY    . CESAREAN SECTION     Social History   Occupational History  . Not on file  Tobacco Use  . Smoking status: Former Smoker    Packs/day: 0.10    Types: Cigarettes  . Smokeless tobacco: Never Used  Substance and Sexual Activity  . Alcohol use: No  . Drug use: No  .  Sexual activity: Yes

## 2019-08-14 NOTE — Progress Notes (Signed)
ult

## 2019-08-14 NOTE — Progress Notes (Signed)
Limited diagnostic ultrasound right knee:  Small effusion in suprapatellar pouch.  Medial meniscus has a hypoechoic cleft consistent with a horizontal tear.  No hyperemia on power doppler imaging.  Findings discussed with Dr. Erlinda Hong.  Patient will make appointment with him for follow-up.

## 2019-08-15 ENCOUNTER — Ambulatory Visit: Payer: Medicaid Other | Admitting: Licensed Clinical Social Worker

## 2019-08-15 MED FILL — hydrOXYzine HCL 25 MG TABS: 25 | 10 days supply | Qty: 30 | Fill #0

## 2019-08-22 ENCOUNTER — Encounter: Payer: Self-pay | Admitting: Orthopaedic Surgery

## 2019-08-22 ENCOUNTER — Ambulatory Visit (INDEPENDENT_AMBULATORY_CARE_PROVIDER_SITE_OTHER): Payer: Self-pay | Admitting: Orthopaedic Surgery

## 2019-08-22 ENCOUNTER — Other Ambulatory Visit: Payer: Self-pay

## 2019-08-22 VITALS — Ht 66.0 in | Wt 195.0 lb

## 2019-08-22 DIAGNOSIS — S83241A Other tear of medial meniscus, current injury, right knee, initial encounter: Secondary | ICD-10-CM

## 2019-08-22 NOTE — Progress Notes (Signed)
Office Visit Note   Patient: Michelle Gibson           Date of Birth: 02-Jul-1976           MRN: [id number] Visit Date: 08/22/2019              Requested by: Antony Blackbird, MD [place],  [place] [id number] PCP: Antony Blackbird, MD   Assessment & Plan: Visit Diagnoses:  1. Acute medial meniscus tear of right knee, initial encounter     Plan: Impression is symptomatic acute right medial meniscal tear.  Patient has had previous cortisone injection without any relief.  At this point given the ultrasound findings and based on our discussion patient has elected to proceed with arthroscopic partial medial meniscectomy and debridement as indicated.  Risks and benefits alternatives reviewed today.  Questions encouraged and answered.  Degenerative joint.  Follow-Up Instructions: Return for 1 week postop visit.   Orders:  No orders of the defined types were placed in this encounter.  No orders of the defined types were placed in this encounter.     Procedures: No procedures performed   Clinical Data: No additional findings.   Subjective: Chief Complaint  Patient presents with  . Right Knee - Pain, Injury    Says she was swaying about 1 month agom and it popped out    Michelle Gibson returns today for her right knee medial meniscal tear.  She recently had an ultrasound by Dr. Junius Roads which showed a horizontal tear of the medial meniscus.  She continues to complain of grinding pain on the medial side.  Denies any swelling.   Review of Systems  Constitutional: Negative.   HENT: Negative.   Eyes: Negative.   Respiratory: Negative.   Cardiovascular: Negative.   Endocrine: Negative.   Musculoskeletal: Negative.   Neurological: Negative.   Hematological: Negative.   Psychiatric/Behavioral: Negative.   All other systems reviewed and are negative.    Objective: Vital Signs: Ht 5\' 6"  (1.676 m)   Wt 195 lb (88.5 kg)   BMI 31.47 kg/m   Physical Exam Vitals signs and nursing  note reviewed.  Constitutional:      Appearance: She is well-developed.  Pulmonary:     Effort: Pulmonary effort is normal.  Skin:    General: Skin is warm.     Capillary Refill: Capillary refill takes less than 2 seconds.  Neurological:     Mental Status: She is alert and oriented to person, place, and time.  Psychiatric:        Behavior: Behavior normal.        Thought Content: Thought content normal.        Judgment: Judgment normal.     Ortho Exam Right knee exam shows medial joint line tenderness.  Negative McMurray. Specialty Comments:  No specialty comments available.  Imaging: No results found.   PMFS History: Patient Active Problem List   Diagnosis Date Noted  . Acute medial meniscus tear of right knee 08/22/2019  . Fever and chills 11/17/2018  . Closed fracture of upper end of fibula 09/21/2018  . WPW (Wolff-Parkinson-White syndrome) 10/10/2017  . Asthma with acute exacerbation 09/15/2016  . Hyperglycemia 09/15/2016  . History of Wolff-Parkinson-White (WPW) syndrome 09/15/2016  . Acute respiratory failure with hypoxia ([place]) 09/15/2016  . Bronchospasm 09/11/2016   Past Medical History:  Diagnosis Date  . Asthma   . Diabetes mellitus without complication ([place])    TYPE II  . WPW (Wolff-Parkinson-White syndrome)  No family history on file.  Past Surgical History:  Procedure Laterality Date  . ABDOMINAL HYSTERECTOMY    . CARDIAC SURGERY    . CESAREAN SECTION     Social History   Occupational History  . Not on file  Tobacco Use  . Smoking status: Former Smoker    Packs/day: 0.10    Types: Cigarettes  . Smokeless tobacco: Never Used  Substance and Sexual Activity  . Alcohol use: No  . Drug use: No  . Sexual activity: Yes

## 2019-08-23 ENCOUNTER — Encounter (HOSPITAL_BASED_OUTPATIENT_CLINIC_OR_DEPARTMENT_OTHER): Payer: Self-pay | Admitting: *Deleted

## 2019-08-25 ENCOUNTER — Other Ambulatory Visit (HOSPITAL_COMMUNITY)
Admission: RE | Admit: 2019-08-25 | Discharge: 2019-08-25 | Disposition: A | Discharge status: Home / Self Care | Payer: Medicaid Other | Source: Ambulatory Visit | Attending: Orthopaedic Surgery | Admitting: Orthopaedic Surgery

## 2019-08-25 DIAGNOSIS — Z01812 Encounter for preprocedural laboratory examination: Secondary | ICD-10-CM | POA: Insufficient documentation

## 2019-08-25 DIAGNOSIS — Z20828 Contact with and (suspected) exposure to other viral communicable diseases: Secondary | ICD-10-CM | POA: Insufficient documentation

## 2019-08-26 LAB — NOVEL CORONAVIRUS, NAA (HOSP ORDER, SEND-OUT TO REF LAB; TAT 18-24 HRS): SARS-CoV-2, NAA: NOT DETECTED

## 2019-08-27 ENCOUNTER — Other Ambulatory Visit: Payer: Self-pay

## 2019-08-27 ENCOUNTER — Encounter (HOSPITAL_BASED_OUTPATIENT_CLINIC_OR_DEPARTMENT_OTHER)
Admission: RE | Admit: 2019-08-27 | Discharge: 2019-08-27 | Disposition: A | Discharge status: Home / Self Care | Payer: Medicaid Other | Source: Ambulatory Visit | Attending: Orthopaedic Surgery | Admitting: Orthopaedic Surgery

## 2019-08-27 ENCOUNTER — Ambulatory Visit: Payer: Medicaid Other | Admitting: Licensed Clinical Social Worker

## 2019-08-27 DIAGNOSIS — Z01812 Encounter for preprocedural laboratory examination: Secondary | ICD-10-CM | POA: Insufficient documentation

## 2019-08-27 DIAGNOSIS — Z20828 Contact with and (suspected) exposure to other viral communicable diseases: Secondary | ICD-10-CM | POA: Insufficient documentation

## 2019-08-27 LAB — BASIC METABOLIC PANEL
Anion gap: 15 (ref 5–15)
BUN: 14 mg/dL (ref 6–20)
CO2: 28 mmol/L (ref 22–32)
Calcium: 10.6 mg/dL — ABNORMAL HIGH (ref 8.9–10.3)
Chloride: 96 mmol/L — ABNORMAL LOW (ref 98–111)
Creatinine, Ser: 0.77 mg/dL (ref 0.44–1.00)
GFR calc Af Amer: >60 mL/min (ref >60)
GFR calc non Af Amer: >60 mL/min (ref >60)
Glucose, Bld: 142 mg/dL — ABNORMAL HIGH (ref 70–99)
Potassium: 4.9 mmol/L (ref 3.5–5.1)
Sodium: 139 mmol/L (ref 135–145)

## 2019-08-27 NOTE — Progress Notes (Signed)

## 2019-08-28 ENCOUNTER — Encounter: Payer: Self-pay | Admitting: Family Medicine

## 2019-08-29 ENCOUNTER — Encounter (HOSPITAL_BASED_OUTPATIENT_CLINIC_OR_DEPARTMENT_OTHER)
Admission: RE | Disposition: A | Discharge status: Home / Self Care | Payer: Self-pay | Source: Home / Self Care | Attending: Orthopaedic Surgery

## 2019-08-29 ENCOUNTER — Ambulatory Visit (HOSPITAL_BASED_OUTPATIENT_CLINIC_OR_DEPARTMENT_OTHER): Payer: Self-pay | Admitting: Anesthesiology

## 2019-08-29 ENCOUNTER — Encounter (HOSPITAL_BASED_OUTPATIENT_CLINIC_OR_DEPARTMENT_OTHER): Payer: Self-pay | Admitting: *Deleted

## 2019-08-29 ENCOUNTER — Other Ambulatory Visit: Payer: Self-pay

## 2019-08-29 ENCOUNTER — Encounter: Payer: Self-pay | Admitting: Orthopaedic Surgery

## 2019-08-29 ENCOUNTER — Ambulatory Visit (HOSPITAL_BASED_OUTPATIENT_CLINIC_OR_DEPARTMENT_OTHER)
Admission: RE | Admit: 2019-08-29 | Discharge: 2019-08-29 | Disposition: A | Discharge status: Home / Self Care | Payer: Self-pay | Attending: Orthopaedic Surgery | Admitting: Orthopaedic Surgery

## 2019-08-29 DIAGNOSIS — G4730 Sleep apnea, unspecified: Secondary | ICD-10-CM | POA: Insufficient documentation

## 2019-08-29 DIAGNOSIS — Z87891 Personal history of nicotine dependence: Secondary | ICD-10-CM | POA: Insufficient documentation

## 2019-08-29 DIAGNOSIS — J45909 Unspecified asthma, uncomplicated: Secondary | ICD-10-CM | POA: Insufficient documentation

## 2019-08-29 DIAGNOSIS — Z791 Long term (current) use of non-steroidal anti-inflammatories (NSAID): Secondary | ICD-10-CM | POA: Insufficient documentation

## 2019-08-29 DIAGNOSIS — Y999 Unspecified external cause status: Secondary | ICD-10-CM | POA: Insufficient documentation

## 2019-08-29 DIAGNOSIS — Z7951 Long term (current) use of inhaled steroids: Secondary | ICD-10-CM | POA: Insufficient documentation

## 2019-08-29 DIAGNOSIS — Z79899 Other long term (current) drug therapy: Secondary | ICD-10-CM | POA: Insufficient documentation

## 2019-08-29 DIAGNOSIS — M659 Synovitis and tenosynovitis, unspecified: Secondary | ICD-10-CM | POA: Insufficient documentation

## 2019-08-29 DIAGNOSIS — Z794 Long term (current) use of insulin: Secondary | ICD-10-CM | POA: Insufficient documentation

## 2019-08-29 DIAGNOSIS — Z888 Allergy status to other drugs, medicaments and biological substances status: Secondary | ICD-10-CM | POA: Insufficient documentation

## 2019-08-29 DIAGNOSIS — Z90710 Acquired absence of both cervix and uterus: Secondary | ICD-10-CM | POA: Insufficient documentation

## 2019-08-29 DIAGNOSIS — S83241A Other tear of medial meniscus, current injury, right knee, initial encounter: Secondary | ICD-10-CM | POA: Diagnosis present

## 2019-08-29 DIAGNOSIS — S83281A Other tear of lateral meniscus, current injury, right knee, initial encounter: Secondary | ICD-10-CM

## 2019-08-29 DIAGNOSIS — I456 Pre-excitation syndrome: Secondary | ICD-10-CM | POA: Insufficient documentation

## 2019-08-29 DIAGNOSIS — Z881 Allergy status to other antibiotic agents status: Secondary | ICD-10-CM | POA: Insufficient documentation

## 2019-08-29 DIAGNOSIS — M94261 Chondromalacia, right knee: Secondary | ICD-10-CM | POA: Insufficient documentation

## 2019-08-29 DIAGNOSIS — E119 Type 2 diabetes mellitus without complications: Secondary | ICD-10-CM | POA: Insufficient documentation

## 2019-08-29 DIAGNOSIS — S83511A Sprain of anterior cruciate ligament of right knee, initial encounter: Secondary | ICD-10-CM | POA: Insufficient documentation

## 2019-08-29 DIAGNOSIS — Z880 Allergy status to penicillin: Secondary | ICD-10-CM | POA: Insufficient documentation

## 2019-08-29 DIAGNOSIS — S83251A Bucket-handle tear of lateral meniscus, current injury, right knee, initial encounter: Secondary | ICD-10-CM | POA: Insufficient documentation

## 2019-08-29 DIAGNOSIS — S83211A Bucket-handle tear of medial meniscus, current injury, right knee, initial encounter: Secondary | ICD-10-CM | POA: Insufficient documentation

## 2019-08-29 HISTORY — PX: KNEE ARTHROSCOPY WITH LATERAL MENISECTOMY: SHX6193

## 2019-08-29 HISTORY — PX: KNEE ARTHROSCOPY WITH MEDIAL MENISECTOMY: SHX5651

## 2019-08-29 HISTORY — DX: Other tear of lateral meniscus, current injury, right knee, initial encounter: S83.281A

## 2019-08-29 LAB — GLUCOSE, CAPILLARY
Glucose-Capillary: 194 mg/dL — ABNORMAL HIGH (ref 70–99)
Glucose-Capillary: 205 mg/dL — ABNORMAL HIGH (ref 70–99)

## 2019-08-29 SURGERY — ARTHROSCOPY, KNEE, WITH MEDIAL MENISCECTOMY
Anesthesia: General | Site: Knee | Laterality: Right

## 2019-08-29 MED ORDER — BUPIVACAINE HCL (PF) 0.5 % IJ SOLN
INTRAMUSCULAR | Status: AC
  Filled 2019-08-29: qty 30

## 2019-08-29 MED ORDER — MEPERIDINE HCL 25 MG/ML IJ SOLN: 6.2500 mg | INTRAMUSCULAR | Status: DC | PRN

## 2019-08-29 MED ORDER — PROMETHAZINE HCL 25 MG/ML IJ SOLN
INTRAMUSCULAR | Status: AC
  Filled 2019-08-29: qty 1

## 2019-08-29 MED ORDER — CEFAZOLIN SODIUM-DEXTROSE 2-4 GM/100ML-% IV SOLN
2.0000 g | INTRAVENOUS | Status: AC
  Administered 2019-08-29: 08:00:00 2 g via INTRAVENOUS

## 2019-08-29 MED ORDER — DEXAMETHASONE SODIUM PHOSPHATE 4 MG/ML IJ SOLN
INTRAMUSCULAR | Status: DC | PRN
  Administered 2019-08-29: 10 mg via INTRAVENOUS

## 2019-08-29 MED ORDER — LIDOCAINE HCL (CARDIAC) PF 100 MG/5ML IV SOSY
PREFILLED_SYRINGE | INTRAVENOUS | Status: DC | PRN
  Administered 2019-08-29: 75 mg via INTRAVENOUS

## 2019-08-29 MED ORDER — ONDANSETRON HCL 4 MG PO TABS: 4.0000 mg | ORAL_TABLET | Freq: Three times a day (TID) | ORAL | 0 refills | Status: DC | PRN

## 2019-08-29 MED ORDER — HYDROMORPHONE HCL 1 MG/ML IJ SOLN
INTRAMUSCULAR | Status: AC
  Filled 2019-08-29: qty 0.5

## 2019-08-29 MED ORDER — HYDROMORPHONE HCL 1 MG/ML IJ SOLN
0.2500 mg | INTRAMUSCULAR | Status: DC | PRN
  Administered 2019-08-29 (×2): 0.25 mg via INTRAVENOUS

## 2019-08-29 MED ORDER — FENTANYL CITRATE (PF) 100 MCG/2ML IJ SOLN
INTRAMUSCULAR | Status: DC | PRN
  Administered 2019-08-29 (×2): 50 ug via INTRAVENOUS
  Administered 2019-08-29: 25 ug via INTRAVENOUS

## 2019-08-29 MED ORDER — OXYCODONE HCL 5 MG PO TABS
ORAL_TABLET | ORAL | Status: AC
  Filled 2019-08-29: qty 1

## 2019-08-29 MED ORDER — ONDANSETRON HCL 4 MG/2ML IJ SOLN
INTRAMUSCULAR | Status: AC
  Filled 2019-08-29: qty 2

## 2019-08-29 MED ORDER — ONDANSETRON HCL 4 MG/2ML IJ SOLN
INTRAMUSCULAR | Status: DC | PRN
  Administered 2019-08-29: 4 mg via INTRAVENOUS

## 2019-08-29 MED ORDER — PROPOFOL 10 MG/ML IV BOLUS
INTRAVENOUS | Status: AC
  Filled 2019-08-29: qty 20

## 2019-08-29 MED ORDER — OXYCODONE HCL 5 MG PO TABS
5.0000 mg | ORAL_TABLET | Freq: Once | ORAL | Status: AC | PRN
  Administered 2019-08-29: 5 mg via ORAL

## 2019-08-29 MED ORDER — PROPOFOL 10 MG/ML IV BOLUS
INTRAVENOUS | Status: DC | PRN
  Administered 2019-08-29: 150 mg via INTRAVENOUS

## 2019-08-29 MED ORDER — LACTATED RINGERS IV SOLN: INTRAVENOUS | Status: DC

## 2019-08-29 MED ORDER — FENTANYL CITRATE (PF) 100 MCG/2ML IJ SOLN
INTRAMUSCULAR | Status: AC
  Filled 2019-08-29: qty 2

## 2019-08-29 MED ORDER — CHLORHEXIDINE GLUCONATE 4 % EX LIQD: 60.0000 mL | Freq: Once | CUTANEOUS | Status: DC

## 2019-08-29 MED ORDER — MIDAZOLAM HCL 2 MG/2ML IJ SOLN
INTRAMUSCULAR | Status: AC
  Filled 2019-08-29: qty 2

## 2019-08-29 MED ORDER — LACTATED RINGERS IV SOLN
INTRAVENOUS | Status: DC
  Administered 2019-08-29: 08:00:00 via INTRAVENOUS

## 2019-08-29 MED ORDER — BUPIVACAINE HCL (PF) 0.5 % IJ SOLN
INTRAMUSCULAR | Status: DC | PRN
  Administered 2019-08-29: 20 mL via INTRA_ARTICULAR

## 2019-08-29 MED ORDER — LIDOCAINE 2% (20 MG/ML) 5 ML SYRINGE
INTRAMUSCULAR | Status: AC
  Filled 2019-08-29: qty 5

## 2019-08-29 MED ORDER — PROMETHAZINE HCL 25 MG/ML IJ SOLN
6.2500 mg | INTRAMUSCULAR | Status: DC | PRN
  Administered 2019-08-29: 6.25 mg via INTRAVENOUS

## 2019-08-29 MED ORDER — CEFAZOLIN SODIUM-DEXTROSE 2-4 GM/100ML-% IV SOLN
INTRAVENOUS | Status: AC
  Filled 2019-08-29: qty 100

## 2019-08-29 MED ORDER — HYDROCODONE-ACETAMINOPHEN 5-325 MG PO TABS: 1.0000 | ORAL_TABLET | Freq: Three times a day (TID) | ORAL | 0 refills | Status: DC | PRN

## 2019-08-29 MED ORDER — MIDAZOLAM HCL 5 MG/5ML IJ SOLN
INTRAMUSCULAR | Status: DC | PRN
  Administered 2019-08-29: 2 mg via INTRAVENOUS

## 2019-08-29 MED ORDER — OXYCODONE HCL 5 MG/5ML PO SOLN: 5.0000 mg | Freq: Once | ORAL | Status: AC | PRN

## 2019-08-29 MED ORDER — SODIUM CHLORIDE 0.9 % IR SOLN
Status: DC | PRN
  Administered 2019-08-29: 10000 mL

## 2019-08-29 MED ORDER — KETOROLAC TROMETHAMINE 10 MG PO TABS: 10.0000 mg | ORAL_TABLET | Freq: Two times a day (BID) | ORAL | 0 refills | Status: DC | PRN

## 2019-08-29 SURGICAL SUPPLY — 44 items
BANDAGE ESMARK 6X9 LF (GAUZE/BANDAGES/DRESSINGS) IMPLANT
BLADE CUDA GRT WHITE 3.5 (BLADE) IMPLANT
BLADE EXCALIBUR 4.0X13 (MISCELLANEOUS) ×2 IMPLANT
BLADE GREAT WHITE 4.2 (BLADE) IMPLANT
BLADE LANZA CVD 15 DEG (BLADE) IMPLANT
BLADE SHAVER TORPEDO 4X13 (MISCELLANEOUS) ×2 IMPLANT
BNDG ELASTIC 6X5.8 VLCR STR LF (GAUZE/BANDAGES/DRESSINGS) ×4 IMPLANT
BNDG ESMARK 6X9 LF (GAUZE/BANDAGES/DRESSINGS)
COVER WAND RF STERILE (DRAPES) IMPLANT
CUFF TOURN SGL QUICK 34 (TOURNIQUET CUFF) ×1
CUFF TRNQT CYL 34X4.125X (TOURNIQUET CUFF) ×1 IMPLANT
DRAPE ARTHROSCOPY W/POUCH 90 (DRAPES) ×2 IMPLANT
DRAPE IMP U-DRAPE 54X76 (DRAPES) ×2 IMPLANT
DRAPE U-SHAPE 47X51 STRL (DRAPES) ×2 IMPLANT
DURAPREP 26ML APPLICATOR (WOUND CARE) ×2 IMPLANT
EXCALIBUR 3.8MM X 13CM (MISCELLANEOUS) ×2 IMPLANT
GAUZE SPONGE 4X4 12PLY STRL (GAUZE/BANDAGES/DRESSINGS) ×2 IMPLANT
GAUZE XEROFORM 1X8 LF (GAUZE/BANDAGES/DRESSINGS) ×2 IMPLANT
GLOVE BIOGEL M STRL SZ7.5 (GLOVE) ×2 IMPLANT
GLOVE BIOGEL PI IND STRL 7.0 (GLOVE) ×1 IMPLANT
GLOVE BIOGEL PI IND STRL 7.5 (GLOVE) ×1 IMPLANT
GLOVE BIOGEL PI IND STRL 8 (GLOVE) ×1 IMPLANT
GLOVE BIOGEL PI INDICATOR 7.0 (GLOVE) ×1
GLOVE BIOGEL PI INDICATOR 7.5 (GLOVE) ×1
GLOVE BIOGEL PI INDICATOR 8 (GLOVE) ×1
GLOVE ECLIPSE 7.0 STRL STRAW (GLOVE) ×2 IMPLANT
GLOVE SKINSENSE NS SZ7.5 (GLOVE) ×1
GLOVE SKINSENSE STRL SZ7.5 (GLOVE) ×1 IMPLANT
GLOVE SURG SYN 7.5  E (GLOVE) ×1
GLOVE SURG SYN 7.5 E (GLOVE) ×1 IMPLANT
GOWN STRL REIN XL XLG (GOWN DISPOSABLE) ×2 IMPLANT
GOWN STRL REUS W/ TWL LRG LVL3 (GOWN DISPOSABLE) IMPLANT
GOWN STRL REUS W/ TWL XL LVL3 (GOWN DISPOSABLE) ×1 IMPLANT
GOWN STRL REUS W/TWL LRG LVL3 (GOWN DISPOSABLE)
GOWN STRL REUS W/TWL XL LVL3 (GOWN DISPOSABLE) ×1
KNEE WRAP E Z 3 GEL PACK (MISCELLANEOUS) ×2 IMPLANT
MANIFOLD NEPTUNE II (INSTRUMENTS) ×2 IMPLANT
PACK ARTHROSCOPY DSU (CUSTOM PROCEDURE TRAY) ×2 IMPLANT
PACK BASIN DAY SURGERY FS (CUSTOM PROCEDURE TRAY) ×2 IMPLANT
SHAVER 4.2 MM LANZA 9391A (BLADE) IMPLANT
SUT ETHILON 3 0 PS 1 (SUTURE) ×2 IMPLANT
TOWEL GREEN STERILE FF (TOWEL DISPOSABLE) ×2 IMPLANT
TUBING ARTHRO INFLOW-ONLY STRL (TUBING) ×2 IMPLANT
TUBING ARTHROSCOPY IRRIG 16FT (MISCELLANEOUS) IMPLANT

## 2019-08-29 NOTE — H&P (Signed)
PREOPERATIVE H&P  Chief Complaint: right knee medial meniscal tear  HPI: Michelle Gibson is a 43 y.o. female who presents for surgical treatment of right knee medial meniscal tear.  She denies any changes in medical history.  Past Medical History:  Diagnosis Date   Asthma    Diabetes mellitus without complication ([place])    TYPE II   Sleep apnea    WPW (Wolff-Parkinson-White syndrome)    Past Surgical History:  Procedure Laterality Date   ABDOMINAL HYSTERECTOMY     CARDIAC SURGERY     CESAREAN SECTION     Social History   Socioeconomic History   Marital status: Divorced    Spouse name: Not on file   Number of children: Not on file   Years of education: Not on file   Highest education level: Not on file  Occupational History   Not on file  Social Needs   Financial resource strain: Not on file   Food insecurity    Worry: Not on file    Inability: Not on file   Transportation needs    Medical: Not on file    Non-medical: Not on file  Tobacco Use   Smoking status: Former Smoker    Packs/day: 0.10    Types: Cigarettes   Smokeless tobacco: Never Used  Substance and Sexual Activity   Alcohol use: No   Drug use: No   Sexual activity: Yes  Lifestyle   Physical activity    Days per week: Not on file    Minutes per session: Not on file   Stress: Not on file  Relationships   Social connections    Talks on phone: Not on file    Gets together: Not on file    Attends religious service: Not on file    Active member of club or organization: Not on file    Attends meetings of clubs or organizations: Not on file    Relationship status: Not on file  Other Topics Concern   Not on file  Social History Narrative   Not on file   History reviewed. No pertinent family history. Allergies  Allergen Reactions   Ciprofloxacin Hives   Flecainide Hives, Nausea And Vomiting and Rash   Penicillin G Rash    Has patient had a PCN reaction causing  immediate rash, facial/tongue/throat swelling, SOB or lightheadedness with hypotension: yes Has patient had a PCN reaction causing severe rash involving mucus membranes or skin necrosis: yes Has patient had a PCN reaction that required hospitalization: no Has patient had a PCN reaction occurring within the last 10 years: no If all of the above answers are "NO", then may proceed with Cephalosporin use.    Prior to Admission medications   Medication Sig Start Date End Date Taking? Authorizing Provider  cyclobenzaprine (FLEXERIL) 5 MG tablet Take 1 tablet twice per day and 1-2 at bedtime as needed for muscle spasm 07/05/19  Yes Fulp, Cammie, MD  gabapentin (NEURONTIN) 300 MG capsule Take 1 capsule (300 mg total) by mouth 4 (four) times daily. 07/05/19  Yes Fulp, Cammie, MD  hydrOXYzine (ATARAX/VISTARIL) 25 MG tablet TAKE 1 TABLET (25 MG TOTAL) BY MOUTH 3 (THREE) TIMES DAILY AS NEEDED FOR ANXIETY. 08/14/19  Yes Fulp, Cammie, MD  ibuprofen (ADVIL) 800 MG tablet Take 1 tablet (800 mg total) by mouth every 8 (eight) hours as needed. 08/02/19  Yes McClung, Angela M, PA-C  Insulin Glargine (LANTUS SOLOSTAR) 100 UNIT/ML Solostar Pen Inject 35 units once per  day 07/05/19  Yes Fulp, Cammie, MD  methocarbamol (ROBAXIN) 500 MG tablet Take 2 tablets (1,000 mg total) by mouth every 8 (eight) hours as needed for muscle spasms. 08/02/19  Yes Argentina Donovan, PA-C  Multiple Vitamins-Calcium (ONE-A-DAY WOMENS PO) Take 1 tablet by mouth daily.   Yes [provider]  pantoprazole (PROTONIX) 20 MG tablet Take 1 tablet (20 mg total) by mouth daily. To reduce stomach acid 07/05/19  Yes Fulp, Cammie, MD  sertraline (ZOLOFT) 100 MG tablet Take 1 tablet (100 mg total) by mouth at bedtime. 06/04/19  Yes Fulp, Cammie, MD  traMADol (ULTRAM) 50 MG tablet Take 1 tablet (50 mg total) by mouth every 6 (six) hours as needed. 08/09/19  Yes Aundra Dubin, PA-C  Blood Glucose Monitoring Suppl (TRUE METRIX AIR GLUCOSE METER)  w/Device KIT 1 kit by Does not apply route 2 (two) times daily. 09/19/18   Fulp, Cammie, MD  glucose blood (TRUE METRIX BLOOD GLUCOSE TEST) test strip Use as instructed to check blood sugars twice daily and as needed 09/19/18   Fulp, Cammie, MD  mometasone-formoterol (DULERA) 200-5 MCG/ACT AERO Inhale 2 puffs into the lungs 2 (two) times daily. Patient taking differently: Inhale 2 puffs into the lungs as needed for wheezing or shortness of breath.  10/17/17   Alfonse Spruce, FNP  TRUEPLUS LANCETS 28G MISC Use twice daily when checking blood sugars and as needed 09/19/18   Fulp, Cammie, MD  metFORMIN (GLUCOPHAGE) 500 MG tablet Take 1 tablet (500 mg total) by mouth 2 (two) times daily with a meal. 08/23/18 07/13/19  Fulp, Cammie, MD  traZODone (DESYREL) 50 MG tablet Take 0.5-1 tablets (25-50 mg total) by mouth at bedtime as needed for sleep. 06/04/19 07/13/19  Fulp, Cammie, MD     Positive ROS: All other systems have been reviewed and were otherwise negative with the exception of those mentioned in the HPI and as above.  Physical Exam: General: Alert, no acute distress Cardiovascular: No pedal edema Respiratory: No cyanosis, no use of accessory musculature GI: abdomen soft Skin: No lesions in the area of chief complaint Neurologic: Sensation intact distally Psychiatric: Patient is competent for consent with normal mood and affect Lymphatic: no lymphedema  MUSCULOSKELETAL: exam stable  Assessment: right knee medial meniscal tear  Plan: Plan for Procedure(s): RIGHT KNEE ARTHROSCOPY WITH MEDIAL MENISECTOMY  The risks benefits and alternatives were discussed with the patient including but not limited to the risks of nonoperative treatment, versus surgical intervention including infection, bleeding, nerve injury,  blood clots, cardiopulmonary complications, morbidity, mortality, among others, and they were willing to proceed.   Eduard Roux, MD   08/29/2019 8:14 AM

## 2019-08-29 NOTE — Discharge Instructions (Signed)

## 2019-08-29 NOTE — Anesthesia Preprocedure Evaluation (Addendum)
Anesthesia Evaluation  Patient identified by MRN, date of birth, ID band Patient awake    Reviewed: Allergy & Precautions, NPO status , Patient's Chart, lab work & pertinent test results  Airway Mallampati: II  TM Distance: >3 FB Neck ROM: Full    Dental  (+) Edentulous Upper, Dental Advisory Given   Pulmonary neg pulmonary ROS, former smoker,    Pulmonary exam normal breath sounds clear to auscultation       Cardiovascular negative cardio ROS Normal cardiovascular exam Rhythm:Regular Rate:Normal     Neuro/Psych negative neurological ROS  negative psych ROS   GI/Hepatic negative GI ROS, Neg liver ROS,   Endo/Other  negative endocrine ROSdiabetes  Renal/GU negative Renal ROS  negative genitourinary   Musculoskeletal negative musculoskeletal ROS (+)   Abdominal   Peds negative pediatric ROS (+)  Hematology negative hematology ROS (+)   Anesthesia Other Findings   Reproductive/Obstetrics negative OB ROS                            Anesthesia Physical Anesthesia Plan  ASA: II  Anesthesia Plan: General   Post-op Pain Management:    Induction: Intravenous  PONV Risk Score and Plan: 4 or greater and Ondansetron, Dexamethasone, Midazolam, Scopolamine patch - Pre-op and Treatment may vary due to age or medical condition  Airway Management Planned: LMA  Additional Equipment: None  Intra-op Plan:   Post-operative Plan: Extubation in OR  Informed Consent: I have reviewed the patients History and Physical, chart, labs and discussed the procedure including the risks, benefits and alternatives for the proposed anesthesia with the patient or authorized representative who has indicated his/her understanding and acceptance.     Dental advisory given  Plan Discussed with: CRNA  Anesthesia Plan Comments:         Anesthesia Quick Evaluation

## 2019-08-29 NOTE — Anesthesia Procedure Notes (Signed)
Procedure Name: LMA Insertion Date/Time: 08/29/2019 8:30 AM Performed by: Marrianne Mood, CRNA Pre-anesthesia Checklist: Patient identified, Emergency Drugs available, Suction available, Patient being monitored and Timeout performed Patient Re-evaluated:Patient Re-evaluated prior to induction Oxygen Delivery Method: Circle system utilized Preoxygenation: Pre-oxygenation with 100% oxygen Induction Type: IV induction Ventilation: Mask ventilation without difficulty LMA: LMA inserted LMA Size: 4.0 Number of attempts: 1 Airway Equipment and Method: Bite block Placement Confirmation: positive ETCO2 Tube secured with: Tape Dental Injury: Teeth and Oropharynx as per pre-operative assessment

## 2019-08-29 NOTE — Op Note (Signed)
° °  Surgery Date: 08/29/2019  Surgeon(s): Leandrew Koyanagi, MD  ASSIST: Madalyn Rob, [place]; necessary for the timely completion of procedure and due to complexity of procedure.  ANESTHESIA:  general  FLUIDS: Per anesthesia record.   ESTIMATED BLOOD LOSS: minimal  PREOPERATIVE DIAGNOSES:  1. Right knee medial meniscus tear  POSTOPERATIVE DIAGNOSES:  1. Right bucket handle medial meniscus tear 2. Right bucket handle lateral meniscus tear 3. Chronic ACL tear  PROCEDURES PERFORMED:  1. Right knee arthroscopy with major synovectomy 2. Right knee arthroscopy with arthroscopic partial medial and lateral meniscectomy 3. Right knee arthroscopy with arthroscopic chondroplasty lateral femoral condyle  DESCRIPTION OF PROCEDURE: Ms. Michelle Gibson is a 43 y.o.-year-old female with right knee pain that failed conservative treatment.  Full discussion held regarding risks benefits alternatives and complications related surgical intervention. Conservative care options reviewed. All questions answered.  The patient was identified in the preoperative holding area and the operative extremity was marked. The patient was brought to the operating room and transferred to operating table in a supine position. Satisfactory general anesthesia was induced by anesthesiology.    Standard anterolateral, anteromedial arthroscopy portals were obtained. The anteromedial portal was obtained with a spinal needle for localization under direct visualization with subsequent diagnostic findings.   Incisions were made for arthroscopy portals.  Diagnostic knee arthroscopy was performed.  Major synovectomy was performed with oscillating shaver on all 3 compartments.  We then positioned the arthroscope in the medial compartment.  There we found that the patient had a chronically torn bucket-handle tear of the medial meniscus that was flipped into the intercondylar notch.  Partial meniscectomy was performed for this with  oscillating shaver and meniscus backs get back to a stable border.  The medial compartment did not exhibit any chondromalacia.  We then looked in the intercondylar notch and noticed a chronically torn ACL.  The patient will was intact.  The knee was then placed in a figure 4 position to evaluate the lateral compartment.  A bucket-handle tear of the lateral meniscus was identified with the meniscus probe.  There were a few areas that had tenuous attachment between the meniscus and capsule.  When probed the lateral meniscus was grossly unstable.  Partial lateral meniscectomy was performed at that point with a meniscus basket and oscillating shaver back to stable border.  Gentle chondroplasty was performed for the lateral femoral condyle which exhibited grade II chondromalacia.  The patellofemoral compartment was evaluated and was unremarkable.  The gutters were checked for loose bodies.  Excess fluid was removed from the knee joint.  Incisions were closed with nylon sutures.  Sterile dressings were applied.  Patient tolerated the procedure well had no immediate complications.  Suprapatellar pouch and gutters: mild synovitis or debris. Patella chondral surface: Grade 0 Trochlear chondral surface: Grade 0 Patellofemoral tracking: normal Medial meniscus: bucket handle tear.  Medial femoral condyle weight bearing surface: Grade 0 Medial tibial plateau: Grade 0 Anterior cruciate ligament: chronically torn Posterior cruciate ligament:stable Lateral meniscus: bucket handle tear.   Lateral femoral condyle weight bearing surface: Grade 2 Lateral tibial plateau: Grade 1  DISPOSITION: The patient was awakened from general anesthetic, extubated, taken to the recovery room in medically stable condition, no apparent complications. The patient may be weightbearing as tolerated to the operative lower extremity.  Range of motion of right knee as tolerated.  Azucena Cecil, MD [facility] 6:32 PM

## 2019-08-29 NOTE — Transfer of Care (Signed)
Immediate Anesthesia Transfer of Care Note  Patient: Michelle Gibson  Procedure(s) Performed: RIGHT KNEE ARTHROSCOPY WITH PARTIAL MEDIAL MENISECTOMY (Right Knee) PARTIAL LATERAL MENISECTOMY (Right Knee)  Patient Location: PACU  Anesthesia Type:General  Level of Consciousness: awake and patient cooperative  Airway & Oxygen Therapy: Patient Spontanous Breathing and Patient connected to face mask oxygen  Post-op Assessment: Report given to RN and Post -op Vital signs reviewed and stable  Post vital signs: Reviewed and stable  Last Vitals:  Vitals Value Taken Time  BP    Temp    Pulse 95 08/29/19 0932  Resp 10 08/29/19 0932  SpO2 100 % 08/29/19 0932  Vitals shown include unvalidated device data.  Last Pain:  Vitals:   08/29/19 0648  TempSrc: Oral  PainSc: 3       Patients Stated Pain Goal: 3 (0000000 [id number])  Complications: No apparent anesthesia complications

## 2019-08-30 ENCOUNTER — Encounter (HOSPITAL_BASED_OUTPATIENT_CLINIC_OR_DEPARTMENT_OTHER): Payer: Self-pay | Admitting: Orthopaedic Surgery

## 2019-08-30 NOTE — Anesthesia Postprocedure Evaluation (Signed)
Anesthesia Post Note  Patient: Michelle Gibson  Procedure(s) Performed: RIGHT KNEE ARTHROSCOPY WITH PARTIAL MEDIAL MENISECTOMY (Right Knee) PARTIAL LATERAL MENISECTOMY (Right Knee)     Patient location during evaluation: PACU Anesthesia Type: General Level of consciousness: sedated and patient cooperative Pain management: pain level controlled Vital Signs Assessment: post-procedure vital signs reviewed and stable Respiratory status: spontaneous breathing Cardiovascular status: stable Anesthetic complications: no    Last Vitals:  Vitals:   08/29/19 1015 08/29/19 1045  BP: 115/82 121/81  Pulse: 85 96  Resp: 13 16  Temp:  37.3 C  SpO2: 95% 98%    Last Pain:  Vitals:   08/30/19 0950  TempSrc:   PainSc: 0-No pain                 Nolon Nations

## 2019-09-03 ENCOUNTER — Ambulatory Visit: Payer: Medicaid Other | Admitting: Licensed Clinical Social Worker

## 2019-09-03 ENCOUNTER — Telehealth: Payer: Self-pay | Admitting: Licensed Clinical Social Worker

## 2019-09-03 NOTE — Telephone Encounter (Signed)
LCSW placed call to patient regarding scheduled [facility] appointment. Left message for a return call.

## 2019-09-05 ENCOUNTER — Encounter: Payer: Self-pay | Admitting: Orthopaedic Surgery

## 2019-09-05 ENCOUNTER — Other Ambulatory Visit: Payer: Self-pay

## 2019-09-05 ENCOUNTER — Ambulatory Visit (INDEPENDENT_AMBULATORY_CARE_PROVIDER_SITE_OTHER): Payer: Self-pay | Admitting: Orthopaedic Surgery

## 2019-09-05 VITALS — Ht 66.0 in | Wt 199.0 lb

## 2019-09-05 DIAGNOSIS — S83241A Other tear of medial meniscus, current injury, right knee, initial encounter: Secondary | ICD-10-CM

## 2019-09-05 NOTE — Progress Notes (Signed)
   Post-Op Visit Note   Patient: Michelle Gibson           Date of Birth: January 17, 1976           MRN: [id number] Visit Date: 09/05/2019 PCP: Antony Blackbird, MD   Assessment & Plan:  Chief Complaint:  Chief Complaint  Patient presents with  . Right Knee - Routine Post Op    Right knee arthroscopy DOS 08/29/2019   Visit Diagnoses:  1. Acute medial meniscus tear of right knee, initial encounter     Plan: [name] is 1 week status post right knee partial medial and partial lateral meniscectomy for bucket-handle tears of both menisci.  She has noticed a significant improvement in her pain with weightbearing.  She takes ibuprofen for the discomfort.  Her surgical incisions are healed without any signs of infection.  Range of motion is coming along.  We remove the sutures in place Steri-Strips today.  I reviewed the arthroscopic findings with her today which shows bucket-handle tears and a chronic ACL injury.  She has some mild chondromalacia of the lateral compartment.  At this point I would like to get her into physical therapy for strengthening.  A hinged knee brace for stability.  We discussed that if she has any instability due to the lack of her ACL we may need to consider reconstruction.  We will recheck her in 5 weeks.  Follow-Up Instructions: Return in about 5 weeks (around 10/10/2019).   Orders:  Orders Placed This Encounter  Procedures  . Ambulatory referral to Physical Therapy   No orders of the defined types were placed in this encounter.   Imaging: No results found.  PMFS History: Patient Active Problem List   Diagnosis Date Noted  . Acute tear lateral meniscus, right, initial encounter 08/29/2019  . Acute medial meniscus tear of right knee 08/22/2019  . Fever and chills 11/17/2018  . Closed fracture of upper end of fibula 09/21/2018  . WPW (Wolff-Parkinson-White syndrome) 10/10/2017  . Asthma with acute exacerbation 09/15/2016  . Hyperglycemia 09/15/2016  . History of  Wolff-Parkinson-White (WPW) syndrome 09/15/2016  . Acute respiratory failure with hypoxia ([place]) 09/15/2016  . Bronchospasm 09/11/2016   Past Medical History:  Diagnosis Date  . Asthma   . Diabetes mellitus without complication ([place])    TYPE II  . Sleep apnea   . WPW (Wolff-Parkinson-White syndrome)     History reviewed. No pertinent family history.  Past Surgical History:  Procedure Laterality Date  . ABDOMINAL HYSTERECTOMY    . CARDIAC SURGERY    . CESAREAN SECTION    . KNEE ARTHROSCOPY WITH LATERAL MENISECTOMY Right 08/29/2019   Procedure: PARTIAL LATERAL MENISECTOMY;  Surgeon: Leandrew Koyanagi, MD;  Location: [facility];  Service: Orthopedics;  Laterality: Right;  . KNEE ARTHROSCOPY WITH MEDIAL MENISECTOMY Right 08/29/2019   Procedure: RIGHT KNEE ARTHROSCOPY WITH PARTIAL MEDIAL MENISECTOMY;  Surgeon: Leandrew Koyanagi, MD;  Location: [facility];  Service: Orthopedics;  Laterality: Right;   Social History   Occupational History  . Not on file  Tobacco Use  . Smoking status: Former Smoker    Packs/day: 0.10    Types: Cigarettes  . Smokeless tobacco: Never Used  Substance and Sexual Activity  . Alcohol use: No  . Drug use: No  . Sexual activity: Yes

## 2019-10-02 ENCOUNTER — Other Ambulatory Visit: Payer: Self-pay

## 2019-10-02 ENCOUNTER — Encounter: Payer: Self-pay | Admitting: Physical Therapy

## 2019-10-02 ENCOUNTER — Ambulatory Visit (INDEPENDENT_AMBULATORY_CARE_PROVIDER_SITE_OTHER): Payer: Self-pay | Admitting: Physical Therapy

## 2019-10-02 DIAGNOSIS — M25561 Pain in right knee: Secondary | ICD-10-CM

## 2019-10-02 DIAGNOSIS — M6281 Muscle weakness (generalized): Secondary | ICD-10-CM

## 2019-10-02 DIAGNOSIS — R2689 Other abnormalities of gait and mobility: Secondary | ICD-10-CM

## 2019-10-02 NOTE — Patient Instructions (Signed)
Access Code: [email or web address]  URL: [email or web address]  Date: 10/02/2019  Prepared by: Elsie Ra   Exercises  Supine Heel Slide with Strap - 10 reps - 3 sets - 5 hold - 2x daily - 6x weekly  Supine Hamstring Stretch with Strap - 3 reps - 1 sets - 30 hold - 2x daily - 6x weekly  Supine Active Straight Leg Raise - 10 reps - 1-2 sets - 2x daily - 6x weekly  Seated Long Arc Quad - 10 reps - 2-3 sets - 2x daily - 6x weekly  Standing Knee Flexion - 10 reps - 3 sets - 2x daily - 6x weekly  Standing Marching - 10 reps - 1-3 sets - 2x daily - 6x weekly  Side Stepping with Counter Support - 3-5 reps - 1 sets - 2x daily - 6x weekly Prone hang X 3-5 min

## 2019-10-02 NOTE — Therapy (Signed)
[place], [place], [address] Phone: [phone]   Fax:  [phone]  Physical Therapy Evaluation  Patient Details  Name: Michelle Gibson MRN: [id number] Date of Birth: [birth year] Referring Provider (PT): Leandrew Koyanagi, MD   Encounter Date: 10/02/2019  PT End of Session - 10/02/19 1226    Visit Number  1    Number of Visits  12    Date for PT Re-Evaluation  11/13/19    Authorization Type  self pay    PT Start Time  1145    PT Stop Time  1225    PT Time Calculation (min)  40 min    Activity Tolerance  Patient tolerated treatment well    Behavior During Therapy  [facility] for tasks assessed/performed       Past Medical History:  Diagnosis Date  . Asthma   . Diabetes mellitus without complication ([place])    TYPE II  . Sleep apnea   . WPW (Wolff-Parkinson-White syndrome)     Past Surgical History:  Procedure Laterality Date  . ABDOMINAL HYSTERECTOMY    . CARDIAC SURGERY    . CESAREAN SECTION    . KNEE ARTHROSCOPY WITH LATERAL MENISECTOMY Right 08/29/2019   Procedure: PARTIAL LATERAL MENISECTOMY;  Surgeon: Leandrew Koyanagi, MD;  Location: [place];  Service: Orthopedics;  Laterality: Right;  . KNEE ARTHROSCOPY WITH MEDIAL MENISECTOMY Right 08/29/2019   Procedure: RIGHT KNEE ARTHROSCOPY WITH PARTIAL MEDIAL MENISECTOMY;  Surgeon: Leandrew Koyanagi, MD;  Location: [place];  Service: Orthopedics;  Laterality: Right;    There were no vitals filed for this visit.   Subjective Assessment - 10/02/19 1148    Subjective  Has Rt knee pain, S/P arthroscopy with menisectomy 08/29/19. 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 She says she still has some difficuly with ROM, standing, walking, and still has some locking and catching in her Rt knee. 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    Pertinent History  PMH: DM,Wolff-Parkinson-White syndrome (rapid  heartbeat), asthma    Limitations  Standing;Walking;House hold activities;Lifting    How long can you stand comfortably?  10 min    How long can you walk comfortably?  5 min    Patient Stated Goals  return to work, get my ROM back    Currently in Pain?  Yes    Pain Score  7     Pain Location  Knee    Pain Orientation  Right    Pain Descriptors / Indicators  Aching    Pain Type  Surgical pain    Pain Onset  More than a month ago    Pain Frequency  Constant    Aggravating Factors   standing, walking, stairs, ROM    Pain Relieving Factors  NSAIDS, ice    Multiple Pain Sites  No         [facility] PT Assessment - 10/02/19 0001      Assessment   Medical Diagnosis  Rt knee pain, S/P arthroscopy with menisectomy    Referring Provider (PT)  Leandrew Koyanagi, MD    Onset Date/Surgical Date  08/29/19    Next MD Visit  09/10/19    Prior Therapy  outpaitient PT before surgery      Precautions   Precautions  None      Restrictions   Weight Bearing Restrictions  No  Balance Screen   Has the patient fallen in the past 6 months  Yes    How many times?  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      Cognition   Overall Cognitive Status  Within Functional Limits for tasks assessed      ROM / Strength   AROM / PROM / Strength  AROM;PROM;Strength      AROM   AROM Assessment Site  Knee    Right/Left Knee  Right    Right Knee Extension  20    Right Knee Flexion  103      Strength   Overall Strength Comments  Rt hip/knee overall 4/5 MMT, Lt hip/knee 5/5 MMT, bilat ankle DF 5/5 MMT      Flexibility   Soft Tissue Assessment /Muscle Length  --   tight hamstring and quad bilat     Palpation   Patella mobility  hypomobile      Transfers    Transfers  Independent with all Transfers      Ambulation/Gait   Gait Comments  ambulates no AD with antalgic gait and  decreased Lt knee extension in stance, decreased Lt knee/hip flexion in swing                Objective measurements completed on examination: See above findings.      [facility] PT Treatment/Exercise - 10/02/19 0001      Exercises   Exercises  Knee/Hip      Knee/Hip Exercises: Stretches   Passive Hamstring Stretch  Right;2 reps;30 seconds    Passive Hamstring Stretch Limitations  supine with strap    Other Knee/Hip Stretches  heelslides supine with strap 5 sec X 15 reps      Knee/Hip Exercises: Aerobic   Recumbent Bike  4 min no resistance      Knee/Hip Exercises: Standing   Knee Flexion  Left;10 reps    Hip Flexion  Both;10 reps;Knee bent      Knee/Hip Exercises: Seated   Long Arc Quad  Left;10 reps      Manual Therapy   Manual therapy comments  patella mobs, knee PROM             PT Education - 10/02/19 1226    Education Details  HEP, POC    Person(s) Educated  Patient    Methods  Explanation;Demonstration;Verbal cues;Handout    Comprehension  Verbalized understanding;Returned demonstration          PT Long Term Goals - 10/02/19 1231      PT LONG TERM GOAL #1   Title  Pt will be I and compliant with HEP. (Target for all LTG 6 weeks 11/13/19    Status  New      PT LONG TERM GOAL #2   Title  Pt will improve Lt knee AROM to WNL.    Status  New      PT LONG TERM GOAL #3   Title  Pt will improve Lt hip/knee strength to 5/5 MMT tested in sitting    Status  New      PT LONG TERM GOAL #4   Title  Pt will be able to  ambulate communitiy distances 1000 ft on even and uneven surfaces and up/down a flight of stairs reciprocally, WFL gait pattern    Status  New      PT LONG TERM GOAL #5   Title  Pt will have less than 3/10 overall pain with ususal activity.    Status  New             Plan - 10/02/19 1227    Clinical  Impression Statement  Pt presents with Rt knee pain, S/P arthroscopy with menisectomy 08/29/19. 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 She has overall decreased hip/knee strength, decreased knee ROM, abnormal gait, decreased activity tolerance for standing, walking, and stairs, and increased pain limiting her function. She will benefit from skilled PT to address her deficits.    Personal Factors and Comorbidities  Comorbidity 2    Comorbidities  PMH: DM,Wolff-Parkinson-White syndrome (rapid heartbeat), asthma    Examination-Activity Limitations  Bend;Carry;Lift;Stand;Stairs;Squat;Locomotion Level    Examination-Participation Restrictions  Meal Prep;Cleaning;Community Activity;Driving;Laundry;Yard Work    [employer]  Evolving/Moderate complexity    Clinical Decision Making  Moderate    Rehab Potential  Good    PT Frequency  2x / week   1-2   PT Duration  6 weeks    PT Treatment/Interventions  ADLs/Self Care Home Management;Aquatic Therapy;Cryotherapy;Electrical Stimulation;Iontophoresis 4mg /ml Dexamethasone;Moist Heat;Ultrasound;Gait training;Neuromuscular re-education;Balance training;Therapeutic exercise;Therapeutic activities;Stair training;Manual techniques;Passive range of motion;Dry needling;Joint Manipulations;Taping;Vasopneumatic Device    PT Next Visit Plan  needs ROM and hip/knee strength, progress gait and standing and stairs as able    PT Home Exercise Plan  Access Code: [email or web address]    Consulted and Agree with Plan of Care  Patient       Patient will benefit from skilled therapeutic intervention in order to improve the following deficits and impairments:  Abnormal gait, Decreased activity tolerance, Decreased balance, Decreased endurance, Decreased mobility, Decreased strength, Decreased range of motion, Difficulty walking, Hypomobility, Pain, Increased fascial restricitons  Visit Diagnosis: Acute pain of right knee  Muscle  weakness (generalized)  Other abnormalities of gait and mobility     Problem List Patient Active Problem List   Diagnosis Date Noted  . Acute tear lateral meniscus, right, initial encounter 08/29/2019  . Acute medial meniscus tear of right knee 08/22/2019  . Fever and chills 11/17/2018  . Closed fracture of upper end of fibula 09/21/2018  . WPW (Wolff-Parkinson-White syndrome) 10/10/2017  . Asthma with acute exacerbation 09/15/2016  . Hyperglycemia 09/15/2016  . History of Wolff-Parkinson-White (WPW) syndrome 09/15/2016  . Acute respiratory failure with hypoxia ([place]) 09/15/2016  . Bronchospasm 09/11/2016    Silvestre Mesi 10/02/2019, 12:36 PM  [facility] Physical Therapy [address] [place], [place], [address] Phone: [phone]   Fax:  [phone]  Name: Michelle Gibson MRN: [id number] Date of Birth: July 16, 1976

## 2019-10-03 ENCOUNTER — Encounter: Payer: Self-pay | Admitting: Emergency Medicine

## 2019-10-03 ENCOUNTER — Ambulatory Visit: Payer: Medicaid Other | Admitting: Family Medicine

## 2019-10-03 ENCOUNTER — Other Ambulatory Visit: Payer: Self-pay

## 2019-10-03 ENCOUNTER — Ambulatory Visit
Admission: EM | Admit: 2019-10-03 | Discharge: 2019-10-03 | Disposition: A | Discharge status: Home / Self Care | Payer: Medicaid Other | Attending: Emergency Medicine | Admitting: Emergency Medicine

## 2019-10-03 DIAGNOSIS — Z20828 Contact with and (suspected) exposure to other viral communicable diseases: Secondary | ICD-10-CM

## 2019-10-03 DIAGNOSIS — Z20822 Contact with and (suspected) exposure to covid-19: Secondary | ICD-10-CM

## 2019-10-03 DIAGNOSIS — R519 Headache, unspecified: Secondary | ICD-10-CM

## 2019-10-03 DIAGNOSIS — R0981 Nasal congestion: Secondary | ICD-10-CM

## 2019-10-03 NOTE — Discharge Instructions (Signed)
Your COVID test is pending - it is important to quarantine / isolate at home until your results are back. °If you test positive and would like further evaluation for persistent or worsening symptoms, you may schedule an E-visit or virtual (video) visit throughout the [facility] My[facility] app or website. ° °PLEASE NOTE: If you develop severe chest pain or shortness of breath please go to the ER or call 9-1-1 for further evaluation --> DO NOT schedule electronic or virtual visits for this. °Please call our office for further guidance / recommendations as needed. °

## 2019-10-03 NOTE — ED Triage Notes (Signed)
Pt presents to [facility] for assessment of runny nose and headache since waking this morning.

## 2019-10-03 NOTE — ED Provider Notes (Signed)
[name]    CSN: [id number] Arrival date & time: 10/03/19  1302      History   Chief Complaint Chief Complaint  Patient presents with  . Headache  . Nasal Congestion    HPI Michelle Gibson is a 43 y.o. female with history of asthma, type 2 diabetes, sleep apnea, WPW presenting for rhinorrhea, generalized headache since this morning.  Patient states headache is mild, alleviated with OTC analgesia.  Denies worst headache of life, waking from her sleep second pain, head trauma, change in vision.  Patient states her son was tested for Covid this morning: Positive.  Patient is cough, shortness of breath.   Past Medical History:  Diagnosis Date  . Asthma   . Diabetes mellitus without complication ([place])    TYPE II  . Sleep apnea   . WPW (Wolff-Parkinson-White syndrome)     Patient Active Problem List   Diagnosis Date Noted  . Acute tear lateral meniscus, right, initial encounter 08/29/2019  . Acute medial meniscus tear of right knee 08/22/2019  . Fever and chills 11/17/2018  . Closed fracture of upper end of fibula 09/21/2018  . WPW (Wolff-Parkinson-White syndrome) 10/10/2017  . Asthma with acute exacerbation 09/15/2016  . Hyperglycemia 09/15/2016  . History of Wolff-Parkinson-White (WPW) syndrome 09/15/2016  . Acute respiratory failure with hypoxia ([place]) 09/15/2016  . Bronchospasm 09/11/2016    Past Surgical History:  Procedure Laterality Date  . ABDOMINAL HYSTERECTOMY    . CARDIAC SURGERY    . CESAREAN SECTION    . KNEE ARTHROSCOPY WITH LATERAL MENISECTOMY Right 08/29/2019   Procedure: PARTIAL LATERAL MENISECTOMY;  Surgeon: Leandrew Koyanagi, MD;  Location: [place];  Service: Orthopedics;  Laterality: Right;  . KNEE ARTHROSCOPY WITH MEDIAL MENISECTOMY Right 08/29/2019   Procedure: RIGHT KNEE ARTHROSCOPY WITH PARTIAL MEDIAL MENISECTOMY;  Surgeon: Leandrew Koyanagi, MD;  Location: [place];  Service: Orthopedics;  Laterality: Right;     OB History   No obstetric history on file.      Home Medications    Prior to Admission medications   Medication Sig Start Date End Date Taking? Authorizing Provider  Blood Glucose Monitoring Suppl (TRUE METRIX AIR GLUCOSE METER) w/Device KIT 1 kit by Does not apply route 2 (two) times daily. 09/19/18   Fulp, Cammie, MD  cyclobenzaprine (FLEXERIL) 5 MG tablet Take 1 tablet twice per day and 1-2 at bedtime as needed for muscle spasm 07/05/19   Fulp, Cammie, MD  gabapentin (NEURONTIN) 300 MG capsule Take 1 capsule (300 mg total) by mouth 4 (four) times daily. Patient taking differently: Take 300 mg by mouth 4 (four) times daily. As needed 07/05/19   Fulp, Cammie, MD  glucose blood (TRUE METRIX BLOOD GLUCOSE TEST) test strip Use as instructed to check blood sugars twice daily and as needed 09/19/18   Fulp, Cammie, MD  hydrOXYzine (ATARAX/VISTARIL) 25 MG tablet TAKE 1 TABLET (25 MG TOTAL) BY MOUTH 3 (THREE) TIMES DAILY AS NEEDED FOR ANXIETY. 08/14/19   Fulp, Cammie, MD  ibuprofen (ADVIL) 800 MG tablet Take 1 tablet (800 mg total) by mouth every 8 (eight) hours as needed. 08/02/19   Argentina Donovan, PA-C  Insulin Glargine (LANTUS SOLOSTAR) 100 UNIT/ML Solostar Pen Inject 35 units once per day 07/05/19   Fulp, Cammie, MD  ketorolac (TORADOL) 10 MG tablet Take 1 tablet (10 mg total) by mouth 2 (two) times daily as needed. 08/29/19   Leandrew Koyanagi, MD  methocarbamol (ROBAXIN) 500 MG  tablet Take 2 tablets (1,000 mg total) by mouth every 8 (eight) hours as needed for muscle spasms. 08/02/19   Argentina Donovan, PA-C  mometasone-formoterol (DULERA) 200-5 MCG/ACT AERO Inhale 2 puffs into the lungs 2 (two) times daily. Patient taking differently: Inhale 2 puffs into the lungs as needed for wheezing or shortness of breath.  10/17/17   Alfonse Spruce, FNP  Multiple Vitamins-Calcium (ONE-A-DAY WOMENS PO) Take 1 tablet by mouth daily.    [provider]  ondansetron (ZOFRAN) 4 MG tablet Take  1-2 tablets (4-8 mg total) by mouth every 8 (eight) hours as needed for nausea or vomiting. 08/29/19   Leandrew Koyanagi, MD  pantoprazole (PROTONIX) 20 MG tablet Take 1 tablet (20 mg total) by mouth daily. To reduce stomach acid 07/05/19   Fulp, Cammie, MD  sertraline (ZOLOFT) 100 MG tablet Take 1 tablet (100 mg total) by mouth at bedtime. 06/04/19   Fulp, Cammie, MD  traMADol (ULTRAM) 50 MG tablet Take 1 tablet (50 mg total) by mouth every 6 (six) hours as needed. 08/09/19   Aundra Dubin, PA-C  TRUEPLUS LANCETS 28G MISC Use twice daily when checking blood sugars and as needed 09/19/18   Fulp, Cammie, MD  metFORMIN (GLUCOPHAGE) 500 MG tablet Take 1 tablet (500 mg total) by mouth 2 (two) times daily with a meal. 08/23/18 07/13/19  Fulp, Cammie, MD  traZODone (DESYREL) 50 MG tablet Take 0.5-1 tablets (25-50 mg total) by mouth at bedtime as needed for sleep. 06/04/19 07/13/19  Antony Blackbird, MD    Family History Family History  Problem Relation Age of Onset  . Hypertension Mother   . Hypertension Father     Social History Social History   Tobacco Use  . Smoking status: Former Smoker    Packs/day: 0.10    Types: Cigarettes  . Smokeless tobacco: Never Used  Substance Use Topics  . Alcohol use: No  . Drug use: No     Allergies   Ciprofloxacin, Flecainide, and Penicillin g   Review of Systems Review of Systems  Constitutional: Negative for activity change, appetite change, fatigue and fever.  HENT: Positive for rhinorrhea. Negative for congestion, ear pain, postnasal drip, sinus pain, sore throat and voice change.   Eyes: Negative for pain, redness and visual disturbance.  Respiratory: Negative for cough and shortness of breath.   Cardiovascular: Negative for chest pain and palpitations.  Gastrointestinal: Negative for abdominal pain, diarrhea and vomiting.  Musculoskeletal: Negative for arthralgias and myalgias.  Skin: Negative for rash and wound.  Neurological: Positive for  headaches. Negative for dizziness, syncope, facial asymmetry, weakness and light-headedness.     Physical Exam Triage Vital Signs ED Triage Vitals  Enc Vitals Group     BP 10/03/19 1319 117/83     Pulse Rate 10/03/19 1319 93     Resp 10/03/19 1319 18     Temp 10/03/19 1319 98.5 F (36.9 C)     Temp Source 10/03/19 1319 Temporal     SpO2 10/03/19 1319 96 %     Weight --      Height --      Head Circumference --      Peak Flow --      Pain Score 10/03/19 1320 3     Pain Loc --      Pain Edu? --      Excl. in [place]? --    No data found.  Updated Vital Signs BP 117/83 (BP Location: Left Arm)  Pulse 93   Temp 98.5 F (36.9 C) (Temporal)   Resp 18   SpO2 96%   Visual Acuity Right Eye Distance:   Left Eye Distance:   Bilateral Distance:    Right Eye Near:   Left Eye Near:    Bilateral Near:     Physical Exam Constitutional:      General: She is not in acute distress. HENT:     Head: Normocephalic and atraumatic.  Eyes:     General: No scleral icterus.    Pupils: Pupils are equal, round, and reactive to light.  Cardiovascular:     Rate and Rhythm: Normal rate.  Pulmonary:     Effort: Pulmonary effort is normal.  Skin:    Coloration: Skin is not jaundiced or pale.  Neurological:     Mental Status: She is alert and oriented to person, place, and time.      UC Treatments / Results  Labs (all labs ordered are listed, but only abnormal results are displayed) Labs Reviewed  NOVEL CORONAVIRUS, NAA    EKG   Radiology No results found.  Procedures Procedures (including critical care time)  Medications Ordered in UC Medications - No data to display  Initial Impression / Assessment and Plan / UC Course  I have reviewed the triage vital signs and the nursing notes.  Pertinent labs & imaging results that were available during my care of the patient were reviewed by me and considered in my medical decision making (see chart for details).     Patient  afebrile, nontoxic, with SpO2 96%.  Covid PCR pending.  Patient to quarantine until results are back.  We will continue supportive management.  Return precautions discussed, patient verbalized understanding and is agreeable to plan. Final Clinical Impressions(s) / UC Diagnoses   Final diagnoses:  Nasal congestion     Discharge Instructions     Your COVID test is pending - it is important to quarantine / isolate at home until your results are back. If you test positive and would like further evaluation for persistent or worsening symptoms, you may schedule an E-visit or virtual (video) visit throughout the [facility] app or website.  PLEASE NOTE: If you develop severe chest pain or shortness of breath please go to the ER or call 9-1-1 for further evaluation --> DO NOT schedule electronic or virtual visits for this. Please call our office for further guidance / recommendations as needed.    ED Prescriptions    None     PDMP not reviewed this encounter.   [place], [place], [place] 10/03/19 1447

## 2019-10-03 NOTE — ED Notes (Signed)
Patient able to ambulate independently  

## 2019-10-05 LAB — NOVEL CORONAVIRUS, NAA: SARS-CoV-2, NAA: NOT DETECTED

## 2019-10-10 ENCOUNTER — Encounter: Payer: Medicaid Other | Admitting: Physical Therapy

## 2019-10-10 ENCOUNTER — Ambulatory Visit: Payer: Medicaid Other | Admitting: Physician Assistant

## 2019-10-23 ENCOUNTER — Telehealth: Payer: Self-pay | Admitting: Physical Therapy

## 2019-10-23 ENCOUNTER — Encounter: Payer: Medicaid Other | Admitting: Physical Therapy

## 2019-10-23 NOTE — Telephone Encounter (Signed)
Pt no show for PT appointment today. They were contacted and informed of this via voicemail. They were provided the date and time of their next appointment on voicemail. They were instructed to call us to let us know if they cannot make their appointment.  Elsie Ra, PT, DPT 10/23/19 1:20 PM

## 2019-10-24 ENCOUNTER — Other Ambulatory Visit: Payer: Self-pay

## 2019-10-24 ENCOUNTER — Ambulatory Visit: Payer: Self-pay | Attending: Family Medicine | Admitting: Family Medicine

## 2019-10-24 ENCOUNTER — Encounter: Payer: Self-pay | Admitting: Family Medicine

## 2019-10-24 DIAGNOSIS — R079 Chest pain, unspecified: Secondary | ICD-10-CM

## 2019-10-24 DIAGNOSIS — R110 Nausea: Secondary | ICD-10-CM

## 2019-10-24 DIAGNOSIS — Z791 Long term (current) use of non-steroidal anti-inflammatories (NSAID): Secondary | ICD-10-CM

## 2019-10-24 MED ORDER — ONDANSETRON HCL 4 MG PO TABS: 4.0000 mg | ORAL_TABLET | Freq: Three times a day (TID) | ORAL | 0 refills | Status: DC | PRN

## 2019-10-24 MED ORDER — PANTOPRAZOLE SODIUM 40 MG PO TBEC: 40.0000 mg | DELAYED_RELEASE_TABLET | Freq: Two times a day (BID) | ORAL | 0 refills | Status: DC

## 2019-10-24 MED FILL — PANTOPRAZOLE SOD DR 40 MG T: 40 | 30 days supply | Qty: 60 | Fill #0

## 2019-10-24 MED FILL — ?ONDANSETRON HCL 4 MG TABLE: 4 | 6 days supply | Qty: 40 | Fill #0

## 2019-10-24 NOTE — Progress Notes (Signed)
Virtual Visit via Telephone Note  I connected with Michelle Gibson on 10/24/19 at 11:10 AM EST by telephone and verified that I am speaking with the correct person using two identifiers.   I discussed the limitations, risks, security and privacy concerns of performing an evaluation and management service by telephone and the availability of in person appointments. I also discussed with the patient that there may be a patient responsible charge related to this service. The patient expressed understanding and agreed to proceed.  Patient Location: Home Provider Location: [facility] Others participating in call: none   History of Present Illness:      44 yo female with history significant for asthma, diabetes, sleep apnea and Wolff-Parkinson-White syndrome possibly status post prior ablation, who presents secondary to complaint of recurrent episodes of chest pain. Pain can be in the mid to left side of chest.  Pain can sometimes feel sharp in nature but often as a pressure sensation.  She also sometimes has an abnormal sensation of radiation of pressure/discomfort into her neck.  She does report a family history significant for both mother and father having had a heart disease with mother having onset of symptoms in her 2s.  The discomfort or pain can last from a few minutes to more than a day.  She has had a few episodes when she has felt slightly nauseous along with her chest pain episode.  She has had no radiation of pain to the upper back/shoulders/arms and no numbness or tingling in the hands or fingertips.  She has at times felt as if she is sweaty during the episodes of chest pain.  Chest pain occurs more so with activity than at rest.  She occasionally feels as if she has some increase chest tightness along with the chest pain and occasional sensation of shortness of breath along with the chest pain/pressure.       She has had some past issues with acid reflux but current left-sided chest pressure  feels different.  Reflux symptoms can occur after certain foods or with late night eating.  She does have some recurrent nausea and would like to have a refill of Zofran which she has taken in the past.  She currently takes pantoprazole 20 mg daily. She does not currently smoke but did smoke in the past. She has been taking ibuprofen for pain. She denies any current abdominal pain, no blood in the stools or black stools.  She has felt fatigued.  She denies any current headaches or dizziness.  No recent fever or chills.  No sensation of abnormal heart rhythm.   Past Medical History:  Diagnosis Date  . Asthma   . Diabetes mellitus without complication ([place])    TYPE II  . Sleep apnea   . WPW (Wolff-Parkinson-White syndrome)     Past Surgical History:  Procedure Laterality Date  . ABDOMINAL HYSTERECTOMY    . CARDIAC SURGERY    . CESAREAN SECTION    . KNEE ARTHROSCOPY WITH LATERAL MENISECTOMY Right 08/29/2019   Procedure: PARTIAL LATERAL MENISECTOMY;  Surgeon: Leandrew Koyanagi, MD;  Location: [place];  Service: Orthopedics;  Laterality: Right;  . KNEE ARTHROSCOPY WITH MEDIAL MENISECTOMY Right 08/29/2019   Procedure: RIGHT KNEE ARTHROSCOPY WITH PARTIAL MEDIAL MENISECTOMY;  Surgeon: Leandrew Koyanagi, MD;  Location: [place];  Service: Orthopedics;  Laterality: Right;    Family History  Problem Relation Age of Onset  . Hypertension Mother   . Hypertension Father  Social History   Tobacco Use  . Smoking status: Former Smoker    Packs/day: 0.10    Types: Cigarettes  . Smokeless tobacco: Never Used  Substance Use Topics  . Alcohol use: No  . Drug use: No     Allergies  Allergen Reactions  . Ciprofloxacin Hives  . Flecainide Hives, Nausea And Vomiting and Rash  . Penicillin G Rash    Has patient had a PCN reaction causing immediate rash, facial/tongue/throat swelling, SOB or lightheadedness with hypotension: yes Has patient had a PCN reaction causing  severe rash involving mucus membranes or skin necrosis: yes Has patient had a PCN reaction that required hospitalization: no Has patient had a PCN reaction occurring within the last 10 years: no If all of the above answers are "NO", then may proceed with Cephalosporin use.        Observations/Objective: No vital signs or physical exam conducted as visit was done via telephone  Assessment and Plan: 1. Chest pain, unspecified type Patient will be referred to cardiology for further evaluation and treatment of chest pain as she reports left-sided chest pain with radiation into the neck and throat and she also has a history of diabetes which can increase the risk of heart disease along with family history of heart disease.  She has been asked to come into the office to also have BMP to check electrolytes and CBC to look for anemia which might be contributing to her chest pressure/chest pain.  She has also been asked to start a higher dose of pantoprazole at 40 mg twice daily in case her symptoms are reflux related and this may help to differentiate her chest pain.  She is aware that she should go to the emergency department for any acute worsening of chest pain or any other concerns. - Ambulatory referral to Cardiology - pantoprazole (PROTONIX) 40 MG tablet; Take 1 tablet (40 mg total) by mouth 2 (two) times daily. To reduce stomach acid  Dispense: 180 tablet; Refill: 0 - Basic Metabolic Panel; Future - CBC; Future  2. Nausea; 3.  Encounter for long-term/current use of nonsteroidal anti-inflammatories She reports recurrent issues with nausea sometimes accompanied by chest pain.  She has been referred to cardiology for further evaluation of her chest pain.  She also has been taking nonsteroidal anti-inflammatories for pain and gastritis/esophagitis may be contributing to her chest pain complaints.  Pantoprazole increased from 20 mg once daily to 40 mg twice daily and refill of Zofran for nausea.  She  is to keep follow-up with cardiology and if cardiology evaluation is negative for heart disease and she continues to have chest pain/pressure and nausea then GI referral will also be warranted. - ondansetron (ZOFRAN) 4 MG tablet; Take 1-2 tablets (4-8 mg total) by mouth every 8 (eight) hours as needed for nausea or vomiting.  Dispense: 40 tablet; Refill: 0 - pantoprazole (PROTONIX) 40 MG tablet; Take 1 tablet (40 mg total) by mouth 2 (two) times daily. To reduce stomach acid  Dispense: 180 tablet; Refill: 0 - Basic Metabolic Panel; Future -CBC; Future   Follow Up Instructions:Return in about 4 weeks (around 11/21/2019) for chronic issues; labs later this week.    I discussed the assessment and treatment plan with the patient. The patient was provided an opportunity to ask questions and all were answered. The patient agreed with the plan and demonstrated an understanding of the instructions.   The patient was advised to call back or seek an in-person evaluation if the  symptoms worsen or if the condition fails to improve as anticipated.  I provided 12 minutes of non-face-to-face time during this encounter.   Antony Blackbird, MD

## 2019-10-26 ENCOUNTER — Encounter: Payer: Self-pay | Admitting: Cardiology

## 2019-10-26 ENCOUNTER — Ambulatory Visit: Payer: Self-pay | Attending: Family Medicine

## 2019-10-26 ENCOUNTER — Other Ambulatory Visit: Payer: Self-pay

## 2019-10-26 ENCOUNTER — Ambulatory Visit (INDEPENDENT_AMBULATORY_CARE_PROVIDER_SITE_OTHER): Payer: Self-pay | Admitting: Cardiology

## 2019-10-26 VITALS — BP 118/88 | HR 87 | Ht 66.0 in | Wt 196.0 lb

## 2019-10-26 DIAGNOSIS — I456 Pre-excitation syndrome: Secondary | ICD-10-CM

## 2019-10-26 DIAGNOSIS — R110 Nausea: Secondary | ICD-10-CM

## 2019-10-26 DIAGNOSIS — R079 Chest pain, unspecified: Secondary | ICD-10-CM

## 2019-10-26 DIAGNOSIS — E119 Type 2 diabetes mellitus without complications: Secondary | ICD-10-CM

## 2019-10-26 DIAGNOSIS — Z791 Long term (current) use of non-steroidal anti-inflammatories (NSAID): Secondary | ICD-10-CM

## 2019-10-26 LAB — BASIC METABOLIC PANEL
BUN/Creatinine Ratio: 20 (ref 9–23)
BUN: 18 mg/dL (ref 6–24)
CO2: 23 mmol/L (ref 20–29)
Calcium: 10.6 mg/dL — ABNORMAL HIGH (ref 8.7–10.2)
Chloride: 98 mmol/L (ref 96–106)
Creatinine, Ser: 0.91 mg/dL (ref 0.57–1.00)
GFR calc Af Amer: 91 mL/min/{1.73_m2} (ref >59)
GFR calc non Af Amer: 79 mL/min/{1.73_m2} (ref >59)
Glucose: 191 mg/dL — ABNORMAL HIGH (ref 65–99)
Potassium: 4.3 mmol/L (ref 3.5–5.2)
Sodium: 138 mmol/L (ref 134–144)

## 2019-10-26 MED ORDER — METOPROLOL TARTRATE 100 MG PO TABS: 100.0000 mg | ORAL_TABLET | Freq: Once | ORAL | 0 refills | Status: DC

## 2019-10-26 MED FILL — METOPROLOL TARTRATE 100 MG: 100 | 1 days supply | Qty: 1 | Fill #0

## 2019-10-26 MED FILL — ALBUTEROL SUL 2.5 MG/3 ML S: (2.5 MG/3ML | 6 days supply | Qty: 75 | Fill #0

## 2019-10-26 NOTE — Progress Notes (Signed)
Cardiology Office Consult Note    Date:  10/26/2019   ID:  Michelle Gibson, DOB Oct 10, 1976, MRN [id number]  PCP:  Antony Blackbird, MD  Cardiologist:  Fransico Him, MD   Chief Complaint  Patient presents with  . New Patient (Initial Visit)    Chest pain    History of Present Illness:  Michelle Gibson is a 44 y.o. female who is being seen today for the evaluation of chest pain at the request of Fulp, Cammie, MD.  This is a 44yo female with a hx of asthma, DM2, and WPW who is referred by her PCP for evaluation of chest pain. She has a remote hx of WPW and underwent ablation at age 35 and has not had any problems since then.  She has a very strong fm hx of premature CAD with her mom having CAD in her 80's and her Dad had a CABG in his 16's.   She tells me that for the past 2 months she has been having episodes of chest pain that she describes as a pressure that radiates up into her neck.  She will get SOB, nauseated, diaphoretic and have HAs with the pain.  The pain is always exertional and never at rest.  Rest improves the pain.  She has had pain lasting 2 days constant at times.  She also has noticed that she now gets SOB with exertion as well.  She denies any PND, orthopnea, LE edema, dizziness, palpitations or syncope.  She has a remote hx of tobacco use. EKG done in PCP office in 08/2019 showed NSR with no ST changes.   Past Medical History:  Diagnosis Date  . Acute tear lateral meniscus, right, initial encounter 08/29/2019  . Asthma   . Closed fracture of upper end of fibula 09/21/2018  . Diabetes mellitus without complication ([place])    TYPE II  . Hyperglycemia 09/15/2016  . WPW (Wolff-Parkinson-White syndrome)    s/p ablation at age 53    Past Surgical History:  Procedure Laterality Date  . ABDOMINAL HYSTERECTOMY    . CARDIAC SURGERY    . CESAREAN SECTION    . KNEE ARTHROSCOPY WITH LATERAL MENISECTOMY Right 08/29/2019   Procedure: PARTIAL LATERAL MENISECTOMY;  Surgeon: Leandrew Koyanagi,  MD;  Location: [place];  Service: Orthopedics;  Laterality: Right;  . KNEE ARTHROSCOPY WITH MEDIAL MENISECTOMY Right 08/29/2019   Procedure: RIGHT KNEE ARTHROSCOPY WITH PARTIAL MEDIAL MENISECTOMY;  Surgeon: Leandrew Koyanagi, MD;  Location: [place];  Service: Orthopedics;  Laterality: Right;    Current Medications: Current Meds  Medication Sig  . Blood Glucose Monitoring Suppl (TRUE METRIX AIR GLUCOSE METER) w/Device KIT 1 kit by Does not apply route 2 (two) times daily.  Marland Kitchen gabapentin (NEURONTIN) 300 MG capsule Take 1 capsule (300 mg total) by mouth 4 (four) times daily. (Patient taking differently: Take 300 mg by mouth 4 (four) times daily. As needed)  . glucose blood (TRUE METRIX BLOOD GLUCOSE TEST) test strip Use as instructed to check blood sugars twice daily and as needed  . hydrOXYzine (ATARAX/VISTARIL) 25 MG tablet TAKE 1 TABLET (25 MG TOTAL) BY MOUTH 3 (THREE) TIMES DAILY AS NEEDED FOR ANXIETY.  Marland Kitchen ibuprofen (ADVIL) 800 MG tablet Take 1 tablet (800 mg total) by mouth every 8 (eight) hours as needed.  . Insulin Glargine (LANTUS SOLOSTAR) 100 UNIT/ML Solostar Pen Inject 35 units once per day  . methocarbamol (ROBAXIN) 500 MG tablet Take 2 tablets (1,000 mg total)  by mouth every 8 (eight) hours as needed for muscle spasms.  . mometasone-formoterol (DULERA) 200-5 MCG/ACT AERO Inhale 2 puffs into the lungs 2 (two) times daily. (Patient taking differently: Inhale 2 puffs into the lungs as needed for wheezing or shortness of breath. )  . Multiple Vitamins-Calcium (ONE-A-DAY WOMENS PO) Take 1 tablet by mouth daily.  . ondansetron (ZOFRAN) 4 MG tablet Take 1-2 tablets (4-8 mg total) by mouth every 8 (eight) hours as needed for nausea or vomiting.  . pantoprazole (PROTONIX) 40 MG tablet Take 1 tablet (40 mg total) by mouth 2 (two) times daily. To reduce stomach acid  . traMADol (ULTRAM) 50 MG tablet Take 1 tablet (50 mg total) by mouth every 6 (six) hours as needed.    . TRUEPLUS LANCETS 28G MISC Use twice daily when checking blood sugars and as needed  . [DISCONTINUED] ketorolac (TORADOL) 10 MG tablet Take 1 tablet (10 mg total) by mouth 2 (two) times daily as needed.  . [DISCONTINUED] sertraline (ZOLOFT) 100 MG tablet Take 1 tablet (100 mg total) by mouth at bedtime.   Current Facility-Administered Medications for the 10/26/19 encounter (Office Visit) with Sueanne Margarita, MD  Medication  . ipratropium-albuterol (DUONEB) 0.5-2.5 (3) MG/3ML nebulizer solution 3 mL    Allergies:   Ciprofloxacin, Flecainide, and Penicillin g   Social History   Socioeconomic History  . Marital status: Divorced    Spouse name: Not on file  . Number of children: Not on file  . Years of education: Not on file  . Highest education level: Not on file  Occupational History  . Not on file  Tobacco Use  . Smoking status: Former Smoker    Packs/day: 0.10    Types: Cigarettes  . Smokeless tobacco: Never Used  Substance and Sexual Activity  . Alcohol use: No  . Drug use: No  . Sexual activity: Yes  Other Topics Concern  . Not on file  Social History Narrative  . Not on file   Social Determinants of Health   Financial Resource Strain:   . Difficulty of Paying Living Expenses: Not on file  Food Insecurity:   . Worried About Charity fundraiser in the Last Year: Not on file  . Ran Out of Food in the Last Year: Not on file  Transportation Needs:   . Lack of Transportation (Medical): Not on file  . Lack of Transportation (Non-Medical): Not on file  Physical Activity:   . Days of Exercise per Week: Not on file  . Minutes of Exercise per Session: Not on file  Stress:   . Feeling of Stress : Not on file  Social Connections:   . Frequency of Communication with Friends and Family: Not on file  . Frequency of Social Gatherings with Friends and Family: Not on file  . Attends Religious Services: Not on file  . Active Member of Clubs or Organizations: Not on file  .  Attends Archivist Meetings: Not on file  . Marital Status: Not on file     Family History:  The patient's family history includes CAD (age of onset: 5) in her father and mother; Diabetes in her mother; Hypertension in her father and mother.   ROS:   Please see the history of present illness.    ROS All other systems reviewed and are negative.  No flowsheet data found.     PHYSICAL EXAM:   VS:  BP 118/88   Pulse 87   Ht 5'  6" (1.676 m)   Wt 196 lb (88.9 kg)   SpO2 93%   BMI 31.64 kg/m    GEN: Well nourished, well developed, in no acute distress  HEENT: normal  Neck: no JVD, carotid bruits, or masses Cardiac: RRR; no murmurs, rubs, or gallops,no edema.  Intact distal pulses bilaterally.  Respiratory:  clear to auscultation bilaterally, normal work of breathing GI: soft, nontender, nondistended, + BS MS: no deformity or atrophy  Skin: warm and dry, no rash Neuro:  Alert and Oriented x 3, Strength and sensation are intact Psych: euthymic mood, full affect  Wt Readings from Last 3 Encounters:  10/26/19 196 lb (88.9 kg)  09/05/19 199 lb (90.3 kg)  08/29/19 199 lb 8.3 oz (90.5 kg)      Studies/Labs Reviewed:   EKG:  EKG is notordered today.    Recent Labs: 03/17/2019: Hemoglobin 12.9; Platelets 341 06/05/2019: ALT 17; TSH 1.240 08/27/2019: BUN 14; Creatinine, Ser 0.77; Potassium 4.9; Sodium 139   Lipid Panel No results found for: CHOL, TRIG, HDL, CHOLHDL, VLDL, LDLCALC, LDLDIRECT  Additional studies/ records that were reviewed today include:  Office notes, EKG from PCP office, 2D echo from 2019 at [facility]:    1. Chest pain of uncertain etiology   2. WPW (Wolff-Parkinson-White syndrome)   3. Type 2 diabetes mellitus without complication, with long-term current use of insulin ([facility])      PLAN:  In order of problems listed above:  1. Chest pain -her symptoms are concerning for possible underlying CAD -she has a strong fm hx of CAD with both  her mom and dad having CAD dx in their 37's -she also has DM and a remote hx of tobacco use -EKG is nonischemic -I will get a coronary CTA to assess for CAD -her HR is in the 80's today so will premed with Lopressor 139m prior to CTA  2.  WPW -s/p ablation at age 44-no further palpitation  3.  Type 2 Dm -followed by PCP -HbA1C 7.7% in Aug 2020 -continue Insulin  Medication Adjustments/Labs and Tests Ordered: Current medicines are reviewed at length with the patient today.  Concerns regarding medicines are outlined above.  Medication changes, Labs and Tests ordered today are listed in the Patient Instructions below.  Patient Instructions  Your cardiac CT will be scheduled at one of the below locations:   [facility] [phone] [place] [phone] [facility] please arrive at the [facility] entrance of [facility]30-45 minutes prior to test start time. Proceed to the [facility] (first floor) to check-in and test prep.  [facility] please arrive 15 mins early for check-in and test prep.  Please follow these instructions carefully:   On the Night Before the Test: . Be sure to Drink plenty of water. . Do not consume any caffeinated/decaffeinated beverages or chocolate 12 hours prior to your test. . Do not take any antihistamines 12 hours prior to your test.  On the Day of the Test: . Drink plenty of water. Do not drink any water within one hour of the test. . Do not eat any food 4 hours prior to the test. . You may take your regular medications prior to the test.  . Take metoprolol (Lopressor) 100 mg  two  hours prior to test time. Marland Kitchen FEMALES- please wear underwire-free bra if available  After the Test: . Drink  plenty of water. . After receiving IV contrast, you may experience a mild flushed feeling. This is normal. . On occasion, you may experience a mild rash up to 24 hours after the test. This is not dangerous. If this occurs, you can take Benadryl 25 mg and increase your fluid intake. . If you experience trouble breathing, this can be serious. If it is severe call 911 IMMEDIATELY. If it is mild, please call our office.   Once we have confirmed authorization from your insurance company, we will call you to set up a date and time for your test.   For non-scheduling related questions, please contact the cardiac imaging nurse navigator should you have any questions/concerns: Marchia Bond, RN Navigator Cardiac Imaging [facility] [phone] Office     Signed, Fransico Him, MD  10/26/2019 10:30 AM    [address], [place], [place]  [address] Phone: [phone]; Fax: [phone]

## 2019-10-26 NOTE — Patient Instructions (Addendum)
Your cardiac CT will be scheduled at one of the below locations:   [facility] [address] [place], [place] [address] (336) [place] [address] [place], [place] [address] [phone]  If scheduled at [facility], please arrive at the [facility] main entrance of [facility] 30-45 minutes prior to test start time. Proceed to the [facility] Radiology Department (first floor) to check-in and test prep.  If scheduled at [facility], please arrive 15 mins early for check-in and test prep.  Please follow these instructions carefully:   On the Night Before the Test: . Be sure to Drink plenty of water. . Do not consume any caffeinated/decaffeinated beverages or chocolate 12 hours prior to your test. . Do not take any antihistamines 12 hours prior to your test.  On the Day of the Test: . Drink plenty of water. Do not drink any water within one hour of the test. . Do not eat any food 4 hours prior to the test. . You may take your regular medications prior to the test.  . Take metoprolol (Lopressor) 100 mg  two hours prior to test time. Marland Kitchen FEMALES- please wear underwire-free bra if available  After the Test: . Drink plenty of water. . After receiving IV contrast, you may experience a mild flushed feeling. This is normal. . On occasion, you may experience a mild rash up to 24 hours after the test. This is not dangerous. If this occurs, you can take Benadryl 25 mg and increase your fluid intake. . If you experience trouble breathing, this can be serious. If it is severe call 911 IMMEDIATELY. If it is mild, please call our office.   Once we have confirmed authorization from your insurance company, we will call you to set up a date and time for your test.   For non-scheduling related questions, please contact the cardiac imaging nurse navigator should you have any  questions/concerns: Marchia Bond, RN Navigator Cardiac Imaging [facility] [phone] Office

## 2019-10-26 NOTE — Addendum Note (Signed)
Addended by: Harland German A on: 10/26/2019 12:42 PM   Modules accepted: Orders

## 2019-10-27 LAB — CBC
Hematocrit: 45.2 % (ref 34.0–46.6)
Hemoglobin: 14.9 g/dL (ref 11.1–15.9)
MCH: 28.7 pg (ref 26.6–33.0)
MCHC: 33 g/dL (ref 31.5–35.7)
MCV: 87 fL (ref 79–97)
Platelets: 460 x10E3/uL — ABNORMAL HIGH (ref 150–450)
RBC: 5.2 x10E6/uL (ref 3.77–5.28)
RDW: 13.1 % (ref 11.7–15.4)
WBC: 10 x10E3/uL (ref 3.4–10.8)

## 2019-10-27 LAB — BASIC METABOLIC PANEL WITH GFR
BUN/Creatinine Ratio: 20 (ref 9–23)
BUN: 20 mg/dL (ref 6–24)
CO2: 26 mmol/L (ref 20–29)
Calcium: 10.3 mg/dL — ABNORMAL HIGH (ref 8.7–10.2)
Chloride: 99 mmol/L (ref 96–106)
Creatinine, Ser: 1.01 mg/dL — ABNORMAL HIGH (ref 0.57–1.00)
GFR calc Af Amer: 80 mL/min/1.73
GFR calc non Af Amer: 69 mL/min/1.73
Glucose: 256 mg/dL — ABNORMAL HIGH (ref 65–99)
Potassium: 4.7 mmol/L (ref 3.5–5.2)
Sodium: 141 mmol/L (ref 134–144)

## 2019-10-29 ENCOUNTER — Telehealth: Payer: Self-pay | Admitting: *Deleted

## 2019-10-29 ENCOUNTER — Other Ambulatory Visit: Payer: Self-pay

## 2019-10-29 ENCOUNTER — Ambulatory Visit (HOSPITAL_COMMUNITY)
Admission: RE | Admit: 2019-10-29 | Discharge: 2019-10-29 | Disposition: A | Discharge status: Home / Self Care | Payer: Self-pay | Source: Ambulatory Visit | Attending: Cardiology | Admitting: Cardiology

## 2019-10-29 DIAGNOSIS — R079 Chest pain, unspecified: Secondary | ICD-10-CM

## 2019-10-29 MED ORDER — METOPROLOL TARTRATE 5 MG/5ML IV SOLN
INTRAVENOUS | Status: AC
  Filled 2019-10-29: qty 5

## 2019-10-29 MED ORDER — NITROGLYCERIN 0.4 MG SL SUBL
0.8000 mg | SUBLINGUAL_TABLET | Freq: Once | SUBLINGUAL | Status: AC
  Administered 2019-10-29: 0.8 mg via SUBLINGUAL

## 2019-10-29 MED ORDER — METOPROLOL TARTRATE 5 MG/5ML IV SOLN: 5.0000 mg | INTRAVENOUS | Status: DC | PRN

## 2019-10-29 MED ORDER — IOHEXOL 350 MG/ML SOLN
80.0000 mL | Freq: Once | INTRAVENOUS | Status: AC | PRN
  Administered 2019-10-29: 80 mL via INTRAVENOUS

## 2019-10-29 MED ORDER — NITROGLYCERIN 0.4 MG SL SUBL
SUBLINGUAL_TABLET | SUBLINGUAL | Status: AC
  Filled 2019-10-29: qty 2

## 2019-10-29 NOTE — Progress Notes (Signed)
CT scan completed. Tolerated well. D/C home awake and alert, in no distress.

## 2019-10-29 NOTE — Telephone Encounter (Signed)
-----   Message from Antony Blackbird, MD sent at 10/27/2019  1:34 PM EST ----- Glucose of 256 and otherwise near normal electrolytes. Mild increase in platelets on CBC otherwise normal

## 2019-10-29 NOTE — Telephone Encounter (Signed)
Patient verified DOB Patient is aware of mineral levels being normal except needing to address elevated DM. Patient is also aware of mild platelet increase and recheck being completed at the next visit.

## 2019-10-30 ENCOUNTER — Telehealth: Payer: Self-pay | Admitting: Cardiology

## 2019-10-30 ENCOUNTER — Encounter: Payer: Medicaid Other | Admitting: Physical Therapy

## 2019-10-30 NOTE — Telephone Encounter (Signed)
Pt has been notified or CT results calcium score 0 no CAD. Pt thanked me for the good news. The patient has been notified of the result and verbalized understanding.  All questions (if any) were answered. Julaine Hua, [facility] 10/30/2019 12:41 PM

## 2019-10-30 NOTE — Telephone Encounter (Signed)
Patient is returning call requesting results from CT on 10/29/19. Please call.

## 2019-11-12 ENCOUNTER — Other Ambulatory Visit: Payer: Self-pay | Admitting: Family Medicine

## 2019-11-12 ENCOUNTER — Telehealth: Payer: Self-pay | Admitting: Family Medicine

## 2019-11-12 DIAGNOSIS — Z794 Long term (current) use of insulin: Secondary | ICD-10-CM

## 2019-11-12 DIAGNOSIS — E1165 Type 2 diabetes mellitus with hyperglycemia: Secondary | ICD-10-CM

## 2019-11-12 DIAGNOSIS — F411 Generalized anxiety disorder: Secondary | ICD-10-CM

## 2019-11-12 MED ORDER — EMPAGLIFLOZIN 10 MG PO TABS: 10.0000 mg | ORAL_TABLET | Freq: Every day | ORAL | 0 refills | Status: DC

## 2019-11-12 NOTE — Progress Notes (Signed)
Patient ID: Michelle Gibson, female   DOB: 03/01/1976, 44 y.o.   MRN: [id number]   Patient left message that her blood sugars remain elevated and she does not believe that her insulin is working and that her blood sugar changes cause her to have mood swings. Patient has an appointment later this week and will send RX to pharmacy for Jardiance to see if she can start this in addition to her Lantus until her upcoming appt

## 2019-11-12 NOTE — Telephone Encounter (Signed)
Patient has follow-up appointment later this week on 11/16/2019.  In the meantime, I have sent a prescription into the [facility] pharmacy for a medication called Jardiance 10 mg that she can take by mouth to help lower her blood sugars in addition to her current Lantus.  She should keep her upcoming appointment and I have also placed a referral to see if flu can also see patient at her upcoming office visit or telephone consultation

## 2019-11-12 NOTE — Telephone Encounter (Signed)
Called pt stated she believes her insulin is not working. She states episodes anger and  episodes of  meltdown when spikes on her GBC readings. Stated today reading of 368 after food intake , took insulin and went down to 268. Please advice if pt would benefit an appt with Lurena Joiner for DM education.  offered pt to speak with [facility] and give some professional advice to control these moments.  Please advice!

## 2019-11-12 NOTE — Telephone Encounter (Signed)
Patient called and requested to speak with her pcp regarding her diabetic rages. Patient stated that she gets into an episode and it causes her to have anxiety and panic attacks. Patient stated when she checked her blood sugar last night it was over 400. Patient stated that she checked it today fasting and it was over 200. Please fu at your earliest convenience.

## 2019-11-13 MED FILL — JARDIANCE 10 MG TABLET: 10 | 30 days supply | Qty: 30 | Fill #0

## 2019-11-13 NOTE — Telephone Encounter (Signed)
Called pt , made her aware of PCP above  instructions. Verbalized understanding.  "Patient has follow-up appointment later this week on 11/16/2019.  In the meantime, I have sent a prescription into the [facility] pharmacy for a medication called Jardiance 10 mg that she can take by mouth to help lower her blood sugars in addition to her current Lantus.  She should keep her upcoming appointment and I have also placed a referral to see if flu can also see patient at her upcoming office"

## 2019-11-14 ENCOUNTER — Other Ambulatory Visit: Payer: Self-pay | Admitting: Family Medicine

## 2019-11-14 DIAGNOSIS — E1165 Type 2 diabetes mellitus with hyperglycemia: Secondary | ICD-10-CM

## 2019-11-14 MED FILL — GABAPENTIN 300 MG CAPSULE: 300 | 30 days supply | Qty: 120 | Fill #2

## 2019-11-14 MED FILL — hydrOXYzine HCL 25 MG TABS: 25 | 10 days supply | Qty: 30 | Fill #0

## 2019-11-15 MED FILL — TRUEplus LANCETS 28G MISC: 25 days supply | Qty: 100 | Fill #0

## 2019-11-16 ENCOUNTER — Other Ambulatory Visit: Payer: Self-pay

## 2019-11-16 ENCOUNTER — Encounter: Payer: Self-pay | Admitting: Family Medicine

## 2019-11-16 ENCOUNTER — Ambulatory Visit: Payer: Self-pay | Attending: Family Medicine | Admitting: Family Medicine

## 2019-11-16 VITALS — BP 125/81 | HR 124 | Ht 66.0 in | Wt 196.8 lb

## 2019-11-16 DIAGNOSIS — F411 Generalized anxiety disorder: Secondary | ICD-10-CM

## 2019-11-16 DIAGNOSIS — E1165 Type 2 diabetes mellitus with hyperglycemia: Secondary | ICD-10-CM

## 2019-11-16 DIAGNOSIS — D473 Essential (hemorrhagic) thrombocythemia: Secondary | ICD-10-CM

## 2019-11-16 DIAGNOSIS — N3001 Acute cystitis with hematuria: Secondary | ICD-10-CM

## 2019-11-16 DIAGNOSIS — Z794 Long term (current) use of insulin: Secondary | ICD-10-CM

## 2019-11-16 DIAGNOSIS — M549 Dorsalgia, unspecified: Secondary | ICD-10-CM

## 2019-11-16 DIAGNOSIS — D75839 Thrombocytosis, unspecified: Secondary | ICD-10-CM

## 2019-11-16 LAB — POCT GLYCOSYLATED HEMOGLOBIN (HGB A1C): HbA1c, POC (controlled diabetic range): 8.4 % — AB (ref 0.0–7.0)

## 2019-11-16 LAB — GLUCOSE, POCT (MANUAL RESULT ENTRY): POC Glucose: 253 mg/dL — AB (ref 70–99)

## 2019-11-16 LAB — POCT URINALYSIS DIP (CLINITEK)
Glucose, UA: NEGATIVE mg/dL
Ketones, POC UA: NEGATIVE mg/dL
Leukocytes, UA: NEGATIVE
Nitrite, UA: NEGATIVE
Spec Grav, UA: >=1.03 — AB
Urobilinogen, UA: 0.2 U/dL
pH, UA: 5

## 2019-11-16 MED ORDER — SERTRALINE HCL 50 MG PO TABS: 50.0000 mg | ORAL_TABLET | Freq: Every day | ORAL | 3 refills | Status: DC

## 2019-11-16 MED ORDER — NOVOLOG FLEXPEN 100 UNIT/ML ~~LOC~~ SOPN: PEN_INJECTOR | SUBCUTANEOUS | 11 refills | Status: DC

## 2019-11-16 MED ORDER — HYDROXYZINE HCL 25 MG PO TABS: 25.0000 mg | ORAL_TABLET | Freq: Three times a day (TID) | ORAL | 3 refills | Status: DC | PRN

## 2019-11-16 MED FILL — SERTRALINE HCL 50 MG TABS: 50 | 30 days supply | Qty: 30 | Fill #0

## 2019-11-16 MED FILL — ?HUMALOG 100 UNITS/ML KWIKP: 100 | 20 days supply | Qty: 3 | Fill #0

## 2019-11-16 NOTE — Progress Notes (Signed)
Blood sugars have been high.  Patient states that she got mad for no reason and is very concerned.

## 2019-11-16 NOTE — Patient Instructions (Signed)

## 2019-11-16 NOTE — Progress Notes (Signed)
Established Patient Office Visit  Subjective:  Patient ID: Michelle Gibson, female    DOB: [birth year]  Age: 44 y.o. MRN: [id number]  CC:  Chief Complaint  Patient presents with  . Diabetes    HPI Michelle Gibson, 44 year old diabetic female with last hemoglobin A1c of 7.7 on 06/05/2019, who is seen in follow-up of her complaint of elevated blood sugars, and increased irritability.  She reports that recently she is found herself to get angry for no reason and recently was very angry and snappy with her family members.  She also reports that her blood sugars have been in the 200s to 300s.  She feels that her elevated blood sugars may be causing her to have issues with her mood.  She does have some issues with anxiety and decreased ability to sleep secondary to feeling anxious/worried.  On review of systems, she also reports increased thirst, increased urinary frequency and some recent onset of left-sided mid- back pain.  Past Medical History:  Diagnosis Date  . Acute tear lateral meniscus, right, initial encounter 08/29/2019  . Asthma   . Closed fracture of upper end of fibula 09/21/2018  . Diabetes mellitus without complication ([place])    TYPE II  . Hyperglycemia 09/15/2016  . WPW (Wolff-Parkinson-White syndrome)    s/p ablation at age 30    Past Surgical History:  Procedure Laterality Date  . ABDOMINAL HYSTERECTOMY    . CARDIAC SURGERY    . CESAREAN SECTION    . KNEE ARTHROSCOPY WITH LATERAL MENISECTOMY Right 08/29/2019   Procedure: PARTIAL LATERAL MENISECTOMY;  Surgeon: Leandrew Koyanagi, MD;  Location: [facility];  Service: Orthopedics;  Laterality: Right;  . KNEE ARTHROSCOPY WITH MEDIAL MENISECTOMY Right 08/29/2019   Procedure: RIGHT KNEE ARTHROSCOPY WITH PARTIAL MEDIAL MENISECTOMY;  Surgeon: Leandrew Koyanagi, MD;  Location: [facility];  Service: Orthopedics;  Laterality: Right;    Family History  Problem Relation Age of Onset  . Hypertension Mother   . CAD  Mother 23  . Diabetes Mother   . Hypertension Father   . CAD Father 90       CABG    Social History   Socioeconomic History  . Marital status: Divorced    Spouse name: Not on file  . Number of children: Not on file  . Years of education: Not on file  . Highest education level: Not on file  Occupational History  . Not on file  Tobacco Use  . Smoking status: Former Smoker    Packs/day: 0.10    Types: Cigarettes  . Smokeless tobacco: Never Used  Substance and Sexual Activity  . Alcohol use: No  . Drug use: No  . Sexual activity: Yes  Other Topics Concern  . Not on file  Social History Narrative  . Not on file   Social Determinants of Health   Financial Resource Strain:   . Difficulty of Paying Living Expenses: Not on file  Food Insecurity:   . Worried About Charity fundraiser in the Last Year: Not on file  . Ran Out of Food in the Last Year: Not on file  Transportation Needs:   . Lack of Transportation (Medical): Not on file  . Lack of Transportation (Non-Medical): Not on file  Physical Activity:   . Days of Exercise per Week: Not on file  . Minutes of Exercise per Session: Not on file  Stress:   . Feeling of Stress : Not on file  Social Connections:   .  Frequency of Communication with Friends and Family: Not on file  . Frequency of Social Gatherings with Friends and Family: Not on file  . Attends Religious Services: Not on file  . Active Member of Clubs or Organizations: Not on file  . Attends Archivist Meetings: Not on file  . Marital Status: Not on file  Intimate Partner Violence:   . Fear of Current or Ex-Partner: Not on file  . Emotionally Abused: Not on file  . Physically Abused: Not on file  . Sexually Abused: Not on file    Outpatient Medications Prior to Visit  Medication Sig Dispense Refill  . empagliflozin (JARDIANCE) 10 MG TABS tablet Take 10 mg by mouth daily before breakfast. To help lower blood sugars 30 tablet 0  . gabapentin  (NEURONTIN) 300 MG capsule Take 1 capsule (300 mg total) by mouth 4 (four) times daily. (Patient taking differently: Take 300 mg by mouth 4 (four) times daily. As needed) 120 capsule 4  . glucose blood (TRUE METRIX BLOOD GLUCOSE TEST) test strip Use as instructed to check blood sugars twice daily and as needed 100 each 6  . hydrOXYzine (ATARAX/VISTARIL) 25 MG tablet Take 1 tablet (25 mg total) by mouth 3 (three) times daily as needed for anxiety. 30 tablet 3  . ibuprofen (ADVIL) 800 MG tablet Take 1 tablet (800 mg total) by mouth every 8 (eight) hours as needed. 60 tablet 0  . Insulin Glargine (LANTUS SOLOSTAR) 100 UNIT/ML Solostar Pen Inject 35 units once per day 5 pen 2  . methocarbamol (ROBAXIN) 500 MG tablet Take 2 tablets (1,000 mg total) by mouth every 8 (eight) hours as needed for muscle spasms. 90 tablet 1  . mometasone-formoterol (DULERA) 200-5 MCG/ACT AERO Inhale 2 puffs into the lungs 2 (two) times daily. (Patient taking differently: Inhale 2 puffs into the lungs as needed for wheezing or shortness of breath. ) 39 Inhaler 3  . Multiple Vitamins-Calcium (ONE-A-DAY WOMENS PO) Take 1 tablet by mouth daily.    . ondansetron (ZOFRAN) 4 MG tablet Take 1-2 tablets (4-8 mg total) by mouth every 8 (eight) hours as needed for nausea or vomiting. 40 tablet 0  . pantoprazole (PROTONIX) 40 MG tablet Take 1 tablet (40 mg total) by mouth 2 (two) times daily. To reduce stomach acid 180 tablet 0  . TRUEplus Lancets 28G MISC USE TWICE DAILY WHEN CHECKING BLOOD SUGARS AND AS NEEDED 100 each 6  . Blood Glucose Monitoring Suppl (TRUE METRIX AIR GLUCOSE METER) w/Device KIT 1 kit by Does not apply route 2 (two) times daily. 1 kit 0  . metoprolol tartrate (LOPRESSOR) 100 MG tablet Take 1 tablet (100 mg total) by mouth once for 1 dose. Take 1-2 hours prior to your cardiac CT scan appointment time. 1 tablet 0  . traMADol (ULTRAM) 50 MG tablet Take 1 tablet (50 mg total) by mouth every 6 (six) hours as needed. (Patient  not taking: Reported on 11/16/2019) 30 tablet 0   Facility-Administered Medications Prior to Visit  Medication Dose Route Frequency Provider Last Rate Last Admin  . ipratropium-albuterol (DUONEB) 0.5-2.5 (3) MG/3ML nebulizer solution 3 mL  3 mL Nebulization Q20 Min PRN Fredia Beets R, FNP   3 mL at 11/14/17 1615    Allergies  Allergen Reactions  . Ciprofloxacin Hives  . Flecainide Hives, Nausea And Vomiting and Rash  . Penicillin G Rash    Has patient had a PCN reaction causing immediate rash, facial/tongue/throat swelling, SOB or lightheadedness with hypotension: yes  Has patient had a PCN reaction causing severe rash involving mucus membranes or skin necrosis: yes Has patient had a PCN reaction that required hospitalization: no Has patient had a PCN reaction occurring within the last 10 years: no If all of the above answers are "NO", then may proceed with Cephalosporin use.     ROS Review of Systems  Constitutional: Positive for fatigue. Negative for chills and fever.  HENT: Negative for sore throat and trouble swallowing.   Respiratory: Negative for cough and shortness of breath.   Cardiovascular: Negative for chest pain, palpitations and leg swelling.  Gastrointestinal: Negative for abdominal pain, constipation, diarrhea and nausea.  Endocrine: Positive for polydipsia and polyuria.  Genitourinary: Positive for frequency.  Musculoskeletal: Negative for arthralgias and back pain.  Neurological: Positive for tremors. Negative for dizziness and headaches.  Psychiatric/Behavioral: Positive for sleep disturbance. Negative for self-injury and suicidal ideas. The patient is nervous/anxious.       Objective:    Physical Exam  Constitutional: She is oriented to person, place, and time. She appears well-developed and well-nourished.  Neck: No JVD present.  Cardiovascular: Normal rate and regular rhythm.  Pulmonary/Chest: Effort normal and breath sounds normal.  Abdominal: Soft.  There is abdominal tenderness (mild suprapubic tenderness to palp). There is no rebound and no guarding.  Musculoskeletal:        General: Tenderness (left CVA tenderness) present. No edema.     Cervical back: Normal range of motion and neck supple.  Lymphadenopathy:    She has no cervical adenopathy.  Neurological: She is alert and oriented to person, place, and time.  Skin: Skin is warm and dry.  Psychiatric: Her behavior is normal.  Slightly anxious, occasional rapid speech  Nursing note and vitals reviewed.   BP 125/81   Pulse (!) 124   Ht '5\' 6"'  (1.676 m)   Wt 196 lb 12.8 oz (89.3 kg)   SpO2 96%   BMI 31.76 kg/m  Wt Readings from Last 3 Encounters:  11/16/19 196 lb 12.8 oz (89.3 kg)  10/26/19 196 lb (88.9 kg)  09/05/19 199 lb (90.3 kg)     Health Maintenance Due  Topic Date Due  . HIV Screening  06/27/1993  . PAP SMEAR-Modifier  06/28/1999     Lab Results  Component Value Date   TSH 1.240 06/05/2019   Lab Results  Component Value Date   WBC 10.0 10/26/2019   HGB 14.9 10/26/2019   HCT 45.2 10/26/2019   MCV 87 10/26/2019   PLT 460 (H) 10/26/2019   Lab Results  Component Value Date   NA 141 10/26/2019   K 4.7 10/26/2019   CO2 26 10/26/2019   GLUCOSE 256 (H) 10/26/2019   BUN 20 10/26/2019   CREATININE 1.01 (H) 10/26/2019   BILITOT 0.2 06/05/2019   ALKPHOS 95 06/05/2019   AST 18 06/05/2019   ALT 17 06/05/2019   PROT 7.7 06/05/2019   ALBUMIN 4.7 06/05/2019   CALCIUM 10.3 (H) 10/26/2019   ANIONGAP 15 08/27/2019   No results found for: CHOL No results found for: HDL No results found for: LDLCALC No results found for: TRIG No results found for: CHOLHDL Lab Results  Component Value Date   HGBA1C 8.4 (A) 11/16/2019      Assessment & Plan:  1. Type 2 diabetes mellitus with hyperglycemia, with long-term current use of insulin (HCC) Patient with postprandial blood sugar today's visit of 253 and hemoglobin A1c of 8.4 which is increased from her prior  A1c of  7.7 on 06/05/2019.  She does have increased urinary frequency, left mid back pain and mild suprapubic discomfort on exam suggestive of urinary tract infection which may be contributing to her elevated blood sugars.  She will have urinalysis to look for UTI.  She additionally will have comprehensive metabolic panel and will have patient start use of premeal regular insulin and she may also use this if her blood sugars are elevated to help with control of blood sugars.  She has been asked to follow-up with clinical pharmacist and bring blood sugar diary and glucometer in the next 1 to 2 weeks regarding her elevated blood sugars. - POCT glucose (manual entry) - POCT glycosylated hemoglobin (Hb A1C) - Comprehensive metabolic panel - CBC  - POCT URINALYSIS DIP (CLINITEK) - insulin aspart (NOVOLOG FLEXPEN) 100 UNIT/ML FlexPen; 5 units before meals and if blood sugar at 250 or higher  Dispense: 15 mL; Refill: 11  2. Thrombocytosis ([place]) She has had a mild increase in platelet count on CBC done 10/26/2019 at 460 and this will be repeated at today's visit. - CBC  3. GAD (generalized anxiety disorder) She has complaint of recent increase in worry and anxiety.  Discussed with patient that she would likely benefit from medication to help lower her level of anxiety.  She has been on sertraline in the past and has not been taking the medication.  She was previously on 100 mg.  She agrees to restart at 50 mg nightly and will return for reevaluation to see if this doses is sufficient.  She declines psychiatry referral or referral for counseling at this time.  She is aware that if she has acute increase in anxiety/depression, suicidal thoughts or ideations that she should go to the emergency department for further evaluation and treatment. - sertraline (ZOLOFT) 50 MG tablet; Take 1 tablet (50 mg total) by mouth daily.  Dispense: 30 tablet; Refill: 3  4. Mid back pain on left side Patient with left-sided mid  back pain, urinary frequency and mild suprapubic discomfort to palpation as well as elevated blood sugars.  Patient will have urinalysis to look for urinary tract infection and will also send urine for culture to make sure that appropriate antibiotic therapy is being prescribed. - Urine Culture  5. Acute cystitis with hematuria Patient with abnormal findings on urinalysis and will be placed on Septra DS x3 days for treatment of urinary tract infection and urine will be sent for culture and patient will be notified if a change in antibiotic therapy is needed based on these results. - sulfamethoxazole-trimethoprim (BACTRIM DS) 800-160 MG tablet; Take 1 tablet by mouth 2 (two) times daily for 5 days.  Dispense: 10 tablet; Refill: 0 -Urine culture  An After Visit Summary was printed and given to the patient.  Follow-up: Return in about 4 weeks (around 12/14/2019) for DM/anxiety; keep f/u with Luke/CPP.   Antony Blackbird, MD

## 2019-11-17 LAB — COMPREHENSIVE METABOLIC PANEL WITH GFR
ALT: 32 IU/L (ref 0–32)
AST: 23 IU/L (ref 0–40)
Albumin/Globulin Ratio: 1.9 (ref 1.2–2.2)
Albumin: 5.1 g/dL — ABNORMAL HIGH (ref 3.8–4.8)
Alkaline Phosphatase: 124 IU/L — ABNORMAL HIGH (ref 39–117)
BUN/Creatinine Ratio: 13 (ref 9–23)
BUN: 14 mg/dL (ref 6–24)
Bilirubin Total: 0.5 mg/dL (ref 0.0–1.2)
CO2: 21 mmol/L (ref 20–29)
Calcium: 10.2 mg/dL (ref 8.7–10.2)
Chloride: 98 mmol/L (ref 96–106)
Creatinine, Ser: 1.08 mg/dL — ABNORMAL HIGH (ref 0.57–1.00)
GFR calc Af Amer: 74 mL/min/1.73
GFR calc non Af Amer: 64 mL/min/1.73
Globulin, Total: 2.7 g/dL (ref 1.5–4.5)
Glucose: 234 mg/dL — ABNORMAL HIGH (ref 65–99)
Potassium: 4 mmol/L (ref 3.5–5.2)
Sodium: 140 mmol/L (ref 134–144)
Total Protein: 7.8 g/dL (ref 6.0–8.5)

## 2019-11-17 LAB — CBC
Hematocrit: 45 % (ref 34.0–46.6)
Hemoglobin: 15.2 g/dL (ref 11.1–15.9)
MCH: 28.7 pg (ref 26.6–33.0)
MCHC: 33.8 g/dL (ref 31.5–35.7)
MCV: 85 fL (ref 79–97)
Platelets: 463 x10E3/uL — ABNORMAL HIGH (ref 150–450)
RBC: 5.29 x10E6/uL — ABNORMAL HIGH (ref 3.77–5.28)
RDW: 13 % (ref 11.7–15.4)
WBC: 9.7 x10E3/uL (ref 3.4–10.8)

## 2019-11-18 LAB — URINE CULTURE

## 2019-11-18 MED ORDER — SULFAMETHOXAZOLE-TRIMETHOPRIM 800-160 MG PO TABS: 1.0000 | ORAL_TABLET | Freq: Two times a day (BID) | ORAL | 0 refills | Status: AC

## 2019-11-19 MED FILL — SULFAMETHOXAZOLE-TMP DS TAB: 800-160 | 5 days supply | Qty: 10 | Fill #0

## 2019-12-14 ENCOUNTER — Ambulatory Visit: Payer: Medicaid Other | Admitting: Family Medicine

## 2019-12-17 ENCOUNTER — Emergency Department (HOSPITAL_COMMUNITY)
Admission: EM | Admit: 2019-12-17 | Discharge: 2019-12-17 | Disposition: A | Discharge status: Home / Self Care | Payer: Self-pay | Attending: Emergency Medicine | Admitting: Emergency Medicine

## 2019-12-17 ENCOUNTER — Other Ambulatory Visit: Payer: Self-pay

## 2019-12-17 ENCOUNTER — Emergency Department (HOSPITAL_COMMUNITY): Payer: Self-pay

## 2019-12-17 DIAGNOSIS — R109 Unspecified abdominal pain: Secondary | ICD-10-CM | POA: Insufficient documentation

## 2019-12-17 LAB — URINALYSIS, ROUTINE W REFLEX MICROSCOPIC
Bilirubin Urine: NEGATIVE
Glucose, UA: 250 mg/dL — AB
Hgb urine dipstick: NEGATIVE
Ketones, ur: 15 mg/dL — AB
Nitrite: POSITIVE — AB
Protein, ur: >300 mg/dL — AB
Specific Gravity, Urine: 1.02 (ref 1.005–1.030)
pH: 6.5 (ref 5.0–8.0)

## 2019-12-17 LAB — CBC WITH DIFFERENTIAL/PLATELET
Abs Immature Granulocytes: 0.02 10*3/uL (ref 0.00–0.07)
Basophils Absolute: 0.1 10*3/uL (ref 0.0–0.1)
Basophils Relative: 1 %
Eosinophils Absolute: 0.4 10*3/uL (ref 0.0–0.5)
Eosinophils Relative: 5 %
HCT: 43.2 % (ref 36.0–46.0)
Hemoglobin: 14.1 g/dL (ref 12.0–15.0)
Immature Granulocytes: 0 %
Lymphocytes Relative: 34 %
Lymphs Abs: 2.6 10*3/uL (ref 0.7–4.0)
MCH: 28.7 pg (ref 26.0–34.0)
MCHC: 32.6 g/dL (ref 30.0–36.0)
MCV: 87.8 fL (ref 80.0–100.0)
Monocytes Absolute: 0.4 10*3/uL (ref 0.1–1.0)
Monocytes Relative: 5 %
Neutro Abs: 4.1 10*3/uL (ref 1.7–7.7)
Neutrophils Relative %: 55 %
Platelets: 381 10*3/uL (ref 150–400)
RBC: 4.92 MIL/uL (ref 3.87–5.11)
RDW: 12.6 % (ref 11.5–15.5)
WBC: 7.5 10*3/uL (ref 4.0–10.5)
nRBC: 0 % (ref 0.0–0.2)

## 2019-12-17 LAB — COMPREHENSIVE METABOLIC PANEL
ALT: 23 U/L (ref 0–44)
AST: 22 U/L (ref 15–41)
Albumin: 3.8 g/dL (ref 3.5–5.0)
Alkaline Phosphatase: 81 U/L (ref 38–126)
Anion gap: 11 (ref 5–15)
BUN: 12 mg/dL (ref 6–20)
CO2: 22 mmol/L (ref 22–32)
Calcium: 9.3 mg/dL (ref 8.9–10.3)
Chloride: 108 mmol/L (ref 98–111)
Creatinine, Ser: 1.01 mg/dL — ABNORMAL HIGH (ref 0.44–1.00)
GFR calc Af Amer: >60 mL/min (ref >60)
GFR calc non Af Amer: >60 mL/min (ref >60)
Glucose, Bld: 201 mg/dL — ABNORMAL HIGH (ref 70–99)
Potassium: 3.7 mmol/L (ref 3.5–5.1)
Sodium: 141 mmol/L (ref 135–145)
Total Bilirubin: 0.6 mg/dL (ref 0.3–1.2)
Total Protein: 7.2 g/dL (ref 6.5–8.1)

## 2019-12-17 LAB — URINALYSIS, MICROSCOPIC (REFLEX)

## 2019-12-17 MED ORDER — HYDROMORPHONE HCL 1 MG/ML IJ SOLN
0.5000 mg | Freq: Once | INTRAMUSCULAR | Status: AC
  Administered 2019-12-17: 11:00:00 0.5 mg via INTRAVENOUS
  Filled 2019-12-17: qty 1

## 2019-12-17 MED ORDER — ONDANSETRON HCL 4 MG/2ML IJ SOLN
4.0000 mg | Freq: Once | INTRAMUSCULAR | Status: AC
  Administered 2019-12-17: 11:00:00 4 mg via INTRAVENOUS
  Filled 2019-12-17: qty 2

## 2019-12-17 MED ORDER — HYDROMORPHONE HCL 1 MG/ML IJ SOLN
0.5000 mg | Freq: Once | INTRAMUSCULAR | Status: AC
  Administered 2019-12-17: 13:00:00 0.5 mg via INTRAVENOUS
  Filled 2019-12-17: qty 1

## 2019-12-17 MED ORDER — SODIUM CHLORIDE 0.9 % IV BOLUS
1000.0000 mL | Freq: Once | INTRAVENOUS | Status: AC
  Administered 2019-12-17: 11:00:00 1000 mL via INTRAVENOUS

## 2019-12-17 MED ORDER — KETOROLAC TROMETHAMINE 15 MG/ML IJ SOLN
15.0000 mg | Freq: Once | INTRAMUSCULAR | Status: AC
  Administered 2019-12-17: 13:00:00 15 mg via INTRAVENOUS
  Filled 2019-12-17: qty 1

## 2019-12-17 MED ORDER — ONDANSETRON 4 MG PO TBDP: 4.0000 mg | ORAL_TABLET | Freq: Three times a day (TID) | ORAL | 0 refills | Status: DC | PRN

## 2019-12-17 MED FILL — ONDANSETRON ODT 4 MG TABLET: 4 | 3 days supply | Qty: 10 | Fill #0

## 2019-12-17 NOTE — ED Triage Notes (Signed)
Pt here for evaluation of L flank pain since Friday. Also has hematuria and burning with urination. Hx of kidney stones. Has been taking Azo with some relief.

## 2019-12-17 NOTE — ED Provider Notes (Signed)
[facility] EMERGENCY DEPARTMENT Provider Note   CSN: [id number] Arrival date & time: 12/17/19  1044     History No chief complaint on file.   Michelle Gibson is a 44 y.o. female.  Patient with history of diabetes, WPW status post ablation, interstitial cystitis--presents to the emergency department today with complaint of left flank pain.  Patient has had pain like this in the past with kidney stones.  First episode of kidney stones was 1 year ago.  Patient had a CT at that time to confirm kidney stones.  She states that she passed the stone without any complications.  She has never followed up with a urologist regarding this.  Over the past 3 days patient has had intermittent sharp, severe, stabbing left-sided flank pains consistent with previous stones.  She has nausea with these episodes.  Pain is no more persistent.  Patient has taken Azo for urinary burning.  She has noted blood in her urine.  No fevers or vomiting.  No chest pain or shortness of breath.        Past Medical History:  Diagnosis Date  . Acute tear lateral meniscus, right, initial encounter 08/29/2019  . Asthma   . Closed fracture of upper end of fibula 09/21/2018  . Diabetes mellitus without complication ([place])    TYPE II  . Hyperglycemia 09/15/2016  . WPW (Wolff-Parkinson-White syndrome)    s/p ablation at age 77    Patient Active Problem List   Diagnosis Date Noted  . Acute tear lateral meniscus, right, initial encounter 08/29/2019  . Acute medial meniscus tear of right knee 08/22/2019  . Fever and chills 11/17/2018  . Closed fracture of upper end of fibula 09/21/2018  . WPW (Wolff-Parkinson-White syndrome) 10/10/2017  . Asthma with acute exacerbation 09/15/2016  . Hyperglycemia 09/15/2016  . History of Wolff-Parkinson-White (WPW) syndrome 09/15/2016  . Acute respiratory failure with hypoxia ([place]) 09/15/2016  . Bronchospasm 09/11/2016    Past Surgical History:  Procedure Laterality Date   . ABDOMINAL HYSTERECTOMY    . CARDIAC SURGERY    . CESAREAN SECTION    . KNEE ARTHROSCOPY WITH LATERAL MENISECTOMY Right 08/29/2019   Procedure: PARTIAL LATERAL MENISECTOMY;  Surgeon: Leandrew Koyanagi, MD;  Location: [facility];  Service: Orthopedics;  Laterality: Right;  . KNEE ARTHROSCOPY WITH MEDIAL MENISECTOMY Right 08/29/2019   Procedure: RIGHT KNEE ARTHROSCOPY WITH PARTIAL MEDIAL MENISECTOMY;  Surgeon: Leandrew Koyanagi, MD;  Location: [facility];  Service: Orthopedics;  Laterality: Right;     OB History   No obstetric history on file.     Family History  Problem Relation Age of Onset  . Hypertension Mother   . CAD Mother 10  . Diabetes Mother   . Hypertension Father   . CAD Father 16       CABG    Social History   Tobacco Use  . Smoking status: Former Smoker    Packs/day: 0.10    Types: Cigarettes  . Smokeless tobacco: Never Used  Substance Use Topics  . Alcohol use: No  . Drug use: No    Home Medications Prior to Admission medications   Medication Sig Start Date End Date Taking? Authorizing Provider  Blood Glucose Monitoring Suppl (TRUE METRIX AIR GLUCOSE METER) w/Device KIT 1 kit by Does not apply route 2 (two) times daily. 09/19/18   Fulp, Cammie, MD  empagliflozin (JARDIANCE) 10 MG TABS tablet Take 10 mg by mouth daily before breakfast. To help lower  blood sugars 11/12/19   Fulp, Cammie, MD  gabapentin (NEURONTIN) 300 MG capsule Take 1 capsule (300 mg total) by mouth 4 (four) times daily. Patient taking differently: Take 300 mg by mouth 4 (four) times daily. As needed 07/05/19   Fulp, Cammie, MD  glucose blood (TRUE METRIX BLOOD GLUCOSE TEST) test strip Use as instructed to check blood sugars twice daily and as needed 09/19/18   Fulp, Cammie, MD  hydrOXYzine (ATARAX/VISTARIL) 25 MG tablet Take 1 tablet (25 mg total) by mouth 3 (three) times daily as needed for anxiety. 11/16/19   Fulp, Cammie, MD  ibuprofen (ADVIL) 800 MG tablet Take 1  tablet (800 mg total) by mouth every 8 (eight) hours as needed. 08/02/19   Argentina Donovan, PA-C  insulin aspart (NOVOLOG FLEXPEN) 100 UNIT/ML FlexPen 5 units before meals and if blood sugar at 250 or higher 11/16/19   Fulp, Cammie, MD  Insulin Glargine (LANTUS SOLOSTAR) 100 UNIT/ML Solostar Pen Inject 35 units once per day 07/05/19   Fulp, Cammie, MD  methocarbamol (ROBAXIN) 500 MG tablet Take 2 tablets (1,000 mg total) by mouth every 8 (eight) hours as needed for muscle spasms. 08/02/19   Argentina Donovan, PA-C  metoprolol tartrate (LOPRESSOR) 100 MG tablet Take 1 tablet (100 mg total) by mouth once for 1 dose. Take 1-2 hours prior to your cardiac CT scan appointment time. 10/26/19 10/26/19  Sueanne Margarita, MD  mometasone-formoterol (DULERA) 200-5 MCG/ACT AERO Inhale 2 puffs into the lungs 2 (two) times daily. Patient taking differently: Inhale 2 puffs into the lungs as needed for wheezing or shortness of breath.  10/17/17   Alfonse Spruce, FNP  Multiple Vitamins-Calcium (ONE-A-DAY WOMENS PO) Take 1 tablet by mouth daily.    [provider]  ondansetron (ZOFRAN) 4 MG tablet Take 1-2 tablets (4-8 mg total) by mouth every 8 (eight) hours as needed for nausea or vomiting. 10/24/19   Fulp, Cammie, MD  pantoprazole (PROTONIX) 40 MG tablet Take 1 tablet (40 mg total) by mouth 2 (two) times daily. To reduce stomach acid 10/24/19   Fulp, Cammie, MD  sertraline (ZOLOFT) 50 MG tablet Take 1 tablet (50 mg total) by mouth daily. 11/16/19   Fulp, Cammie, MD  traMADol (ULTRAM) 50 MG tablet Take 1 tablet (50 mg total) by mouth every 6 (six) hours as needed. Patient not taking: Reported on 11/16/2019 08/09/19   Aundra Dubin, PA-C  TRUEplus Lancets 28G MISC USE TWICE DAILY WHEN CHECKING BLOOD SUGARS AND AS NEEDED 11/15/19   Fulp, Cammie, MD  metFORMIN (GLUCOPHAGE) 500 MG tablet Take 1 tablet (500 mg total) by mouth 2 (two) times daily with a meal. 08/23/18 07/13/19  Fulp, Cammie, MD  traZODone (DESYREL) 50 MG  tablet Take 0.5-1 tablets (25-50 mg total) by mouth at bedtime as needed for sleep. 06/04/19 07/13/19  Fulp, Cammie, MD    Allergies    Ciprofloxacin, Flecainide, and Penicillin g  Review of Systems   Review of Systems  Constitutional: Negative for fever.  HENT: Negative for rhinorrhea and sore throat.   Eyes: Negative for redness.  Respiratory: Negative for cough.   Cardiovascular: Negative for chest pain.  Gastrointestinal: Positive for nausea. Negative for abdominal pain, diarrhea and vomiting.  Genitourinary: Positive for dysuria, flank pain and hematuria.  Musculoskeletal: Negative for myalgias.  Skin: Negative for rash.  Neurological: Negative for headaches.    Physical Exam Updated Vital Signs BP 126/73 (BP Location: Left Arm)   Pulse 76   Temp 98.5  F (36.9 C) (Oral)   Resp 16   SpO2 95%   Physical Exam Vitals and nursing note reviewed.  Constitutional:      General: She is in acute distress.     Appearance: She is well-developed.     Comments: Patient standing in the room, pacing.  Patient produced a urine container with red urine.  HENT:     Head: Normocephalic and atraumatic.  Eyes:     General:        Right eye: No discharge.        Left eye: No discharge.     Conjunctiva/sclera: Conjunctivae normal.  Cardiovascular:     Rate and Rhythm: Normal rate and regular rhythm.     Heart sounds: Normal heart sounds.  Pulmonary:     Effort: Pulmonary effort is normal.     Breath sounds: Normal breath sounds.  Abdominal:     Palpations: Abdomen is soft.     Tenderness: There is no abdominal tenderness. There is left CVA tenderness. There is no right CVA tenderness, guarding or rebound.  Musculoskeletal:     Cervical back: Normal range of motion and neck supple.  Skin:    General: Skin is warm and dry.  Neurological:     Mental Status: She is alert.     ED Results / Procedures / Treatments   Labs (all labs ordered are listed, but only abnormal results are  displayed) Labs Reviewed  COMPREHENSIVE METABOLIC PANEL - Abnormal; Notable for the following components:      Result Value   Glucose, Bld 201 (*)    Creatinine, Ser 1.01 (*)    All other components within normal limits  URINALYSIS, ROUTINE W REFLEX MICROSCOPIC - Abnormal; Notable for the following components:   Color, Urine AMBER (*)    APPearance TURBID (*)    Glucose, UA 250 (*)    Ketones, ur 15 (*)    Protein, ur >300 (*)    Nitrite POSITIVE (*)    Leukocytes,Ua MODERATE (*)    All other components within normal limits  URINALYSIS, MICROSCOPIC (REFLEX) - Abnormal; Notable for the following components:   Bacteria, UA FEW (*)    All other components within normal limits  URINE CULTURE  CBC WITH DIFFERENTIAL/PLATELET    EKG None  Radiology CT Renal Stone Study  Result Date: 12/17/2019 CLINICAL DATA:  Left flank pain EXAM: CT ABDOMEN AND PELVIS WITHOUT CONTRAST TECHNIQUE: Multidetector CT imaging of the abdomen and pelvis was performed following the standard protocol without oral or IV contrast. COMPARISON:  March 17, 2019 FINDINGS: Lower chest: Lung bases are clear. Hepatobiliary: There is a degree of hepatic steatosis. No focal liver lesions are evident on this noncontrast enhanced study. The gallbladder is absent. The common bile duct measures 9 mm which is prominent. The common bile duct tapers distally without demonstrable biliary duct mass or calculus evident by CT. Pancreas: No pancreatic mass or inflammatory focus evident. Spleen: No splenic lesions evident. Adrenals/Urinary Tract: Adrenals bilaterally appear unremarkable. There is no evident renal mass or hydronephrosis on either side. There is a 1 mm calculus in the lower pole of the right kidney. There is no appreciable ureteral calculus on either side. Urinary bladder is midline with wall thickness within normal limits. Stomach/Bowel: There are occasional sigmoid diverticula without diverticulitis. There is no appreciable bowel  wall or mesenteric thickening. There is no bowel obstruction. Terminal ileum appears normal. No free air or portal venous air. Vascular/Lymphatic: There is no abdominal  aortic aneurysm. No vascular lesions evident on this noncontrast enhanced study. There is no evident adenopathy in the abdomen or pelvis. Reproductive: Uterus is absent.  No pelvic mass evident. Other: Appendix not seen. There are clips in the region of the appendix. There is no periappendiceal region inflammation. There is no abscess or ascites in the abdomen or pelvis. There is a stimulator in the right pelvic soft tissues. Stimulator lead tip is posterior to the sacrum slightly to the left of midline. Musculoskeletal: No blastic or lytic bone lesions. No intramuscular lesions are evident. IMPRESSION: 1. 1 mm calculus lower pole right kidney. No hydronephrosis or ureteral calculus on either side. Urinary bladder wall thickness normal. 2. Occasional sigmoid diverticula without diverticulitis. No bowel obstruction. No abscess in the abdomen or pelvis. No periappendiceal region inflammatory change. 3. Gallbladder absent. Mild prominence of the common bile duct is within normal limits for post cholecystectomy state. 4.  Hepatic steatosis. 5.  Uterus absent. Electronically Signed   By: Lowella Grip III M.D.   On: 12/17/2019 13:57    Procedures Procedures (including critical care time)  Medications Ordered in ED Medications  HYDROmorphone (DILAUDID) injection 0.5 mg (0.5 mg Intravenous Given 12/17/19 1117)  ondansetron (ZOFRAN) injection 4 mg (4 mg Intravenous Given 12/17/19 1117)  sodium chloride 0.9 % bolus 1,000 mL (0 mLs Intravenous Stopped 12/17/19 1323)  ketorolac (TORADOL) 15 MG/ML injection 15 mg (15 mg Intravenous Given 12/17/19 1233)  HYDROmorphone (DILAUDID) injection 0.5 mg (0.5 mg Intravenous Given 12/17/19 1321)    ED Course  I have reviewed the triage vital signs and the nursing notes.  Pertinent labs & imaging results that  were available during my care of the patient were reviewed by me and considered in my medical decision making (see chart for details).  Patient seen and examined. Reviewed previous CT and visit for renal colic.  Work-up initiated. Medications ordered. Will treat as stone and monitor. Will reimage if signs of infection, AKI, or not improving with treatment.   Vital signs reviewed and are as follows: BP 126/73 (BP Location: Left Arm)   Pulse 76   Temp 98.5 F (36.9 C) (Oral)   Resp 16   SpO2 95%   1:15 PM Patient rechecked. Pain 6/10 --> 5/10. No blood cells in urine. Will give additional pain medication and obtain renal protocol CT.   2:14 PM CT reviewed.   2:34 PM patient updated on results.  Her pain is improved and she appears comfortable.  She is lying in bed in no distress.  Plan to discharged home with PCP follow-up.  Encouraged use of ibuprofen or Tylenol for pain as well as heat on areas if needed.  Pt in agreement.  Questions answered.  Urine culture added.  The patient was urged to return to the Emergency Department immediately with worsening of current symptoms, worsening abdominal pain, persistent vomiting, blood noted in stools, fever, or any other concerns. The patient verbalized understanding.     MDM Rules/Calculators/A&P                      Patient with flank pain and symptoms suggestive of left-sided renal colic.  Urine was dark with positive nitrite which I think is a false positive due to the color of the urine likely secondary to use of Azo.  Urine was not a clean-catch.  There are a few white blood cells but not signs of an overwhelming infection.  Culture added.  Otherwise white blood cell  count is reassuring.  Kidney function is okay.  Minor hyperglycemia without DKA.  CT imaging does not demonstrate any stone today.  No other acute intra-abdominal pathology noted which would explain the patient's symptoms.  At this time I feel she can be discharged home with  symptomatic treatment and close PCP follow-up.  Return instructions as above.  Patient agrees to return if these occur and is comfortable with plan.    Final Clinical Impression(s) / ED Diagnoses Final diagnoses:  Acute left flank pain    Rx / DC Orders ED Discharge Orders    None       Carlisle Cater, PA-C 12/17/19 1437    Wyvonnia Dusky, MD 12/17/19 [phone]

## 2019-12-17 NOTE — ED Notes (Signed)
Junita Push, RN wasted 0.5 mg of dilaudid in sharps. This RN witnessed the waste.

## 2019-12-17 NOTE — Discharge Instructions (Signed)
Please read and follow all provided instructions.  Your diagnoses today include:  1. Acute left flank pain     Tests performed today include:  Blood counts and electrolytes  Blood tests to check liver and kidney function  Urine test to look for infection - no definite infection, culture pending  Vital signs. See below for your results today.   Medications prescribed:   Zofran (ondansetron) - for nausea and vomiting  Take any prescribed medications only as directed.  Home care instructions:   Follow any educational materials contained in this packet.  Follow-up instructions: Please follow-up with your primary care provider in the next 3 days for further evaluation of your symptoms.    Return instructions:  SEEK IMMEDIATE MEDICAL ATTENTION IF:  The pain does not go away or becomes severe   A temperature above 101F develops   Repeated vomiting occurs (multiple episodes)   The pain becomes localized to portions of the abdomen. The right side could possibly be appendicitis. In an adult, the left lower portion of the abdomen could be colitis or diverticulitis.   Blood is being passed in stools or vomit (bright red or black tarry stools)   You develop chest pain, difficulty breathing, dizziness or fainting, or become confused, poorly responsive, or inconsolable (young children)  If you have any other emergent concerns regarding your health  Additional Information: Abdominal (belly) pain can be caused by many things. Your caregiver performed an examination and possibly ordered blood/urine tests and imaging (CT scan, x-rays, ultrasound). Many cases can be observed and treated at home after initial evaluation in the emergency department. Even though you are being discharged home, abdominal pain can be unpredictable. Therefore, you need a repeated exam if your pain does not resolve, returns, or worsens. Most patients with abdominal pain don't have to be admitted to the hospital or  have surgery, but serious problems like appendicitis and gallbladder attacks can start out as nonspecific pain. Many abdominal conditions cannot be diagnosed in one visit, so follow-up evaluations are very important.  Your vital signs today were: BP 107/70   Pulse (!) 56   Temp 98.5 F (36.9 C) (Oral)   Resp 17   SpO2 99%  If your blood pressure (bp) was elevated above 135/85 this visit, please have this repeated by your doctor within one month. --------------

## 2019-12-17 NOTE — ED Notes (Signed)
Wasted 0.5 mg Dilaudid in sharps. Mykenzie Cochrane, RN witnessed wasted.

## 2019-12-19 LAB — URINE CULTURE: Culture: >=100000 — AB

## 2019-12-20 ENCOUNTER — Telehealth: Payer: Self-pay | Admitting: *Deleted

## 2019-12-20 NOTE — Progress Notes (Signed)
ED Antimicrobial Stewardship Positive Culture Follow Up   Michelle Gibson is an 44 y.o. female who presented to [facility] on 12/17/2019 with a chief complaint of left flank pain, dysuria, hematuria.   Recent Results (from the past 720 hour(s))  Urine Culture     Status: Abnormal   Collection Time: 12/17/19 11:21 AM   Specimen: Urine, Random  Result Value Ref Range Status   Specimen Description URINE, RANDOM  Final   Special Requests   Final    ADDED 2251 Performed at [facility], [address], [place], [place] [address]    Culture >=100,000 COLONIES/mL ESCHERICHIA COLI (A)  Final   Report Status 12/19/2019 FINAL  Final   Organism ID, Bacteria ESCHERICHIA COLI (A)  Final      Susceptibility   Escherichia coli - MIC*    AMPICILLIN <=2 SENSITIVE Sensitive     CEFAZOLIN <=4 SENSITIVE Sensitive     CEFTRIAXONE <=0.25 SENSITIVE Sensitive     CIPROFLOXACIN <=0.25 SENSITIVE Sensitive     GENTAMICIN <=1 SENSITIVE Sensitive     IMIPENEM <=0.25 SENSITIVE Sensitive     NITROFURANTOIN <=16 SENSITIVE Sensitive     TRIMETH/SULFA <=20 SENSITIVE Sensitive     AMPICILLIN/SULBACTAM <=2 SENSITIVE Sensitive     PIP/TAZO <=4 SENSITIVE Sensitive     * >=100,000 COLONIES/mL ESCHERICHIA COLI    [x]  Patient discharged originally without antimicrobial agent and treatment is now indicated given positive urine cultures with urinary symptoms.   New antibiotic prescription: Bactrim DS 1 tab po BID x3 days   ED Provider: Coral Ceo, PA-C  Claudina Lick, PharmD Candidate  12/20/2019, 9:36 AM   Clinical Pharmacist Monday - Friday phone -  [phone] Saturday - Sunday phone - [phone]

## 2019-12-20 NOTE — Telephone Encounter (Signed)
Post ED Visit - Positive Culture Follow-up: Unsuccessful Patient Follow-up  Culture assessed and recommendations reviewed by:  []  Elenor Quinones, Pharm.D. []  Heide Guile, Pharm.D., BCPS AQ-ID []  Parks Neptune, Pharm.D., BCPS []  Alycia Rossetti, Pharm.D., BCPS []  [place], Pharm.D., BCPS, AAHIVP []  Legrand Como, Pharm.D., BCPS, AAHIVP []  Wynell Balloon, PharmD []  Vincenza Hews, PharmD, BCPS  Positive urine culture  [x]  Patient discharged without antimicrobial prescription and treatment is now indicated []  Organism is resistant to prescribed ED discharge antimicrobial []  Patient with positive blood cultures  Plan: Bactrim DS 1 tabliet BID x 3 days, Cortni Couture, PA  Unable to contact patient after 3 attempts, letter will be sent to address on file  Ardeen Fillers 12/20/2019, 11:31 AM

## 2019-12-21 MED FILL — GABAPENTIN 300 MG CAPSULE: 300 | 30 days supply | Qty: 120 | Fill #3

## 2019-12-21 MED FILL — ?SULFAMETHOXAZOLE-TMP DS TB: 800-160 | 5 days supply | Qty: 10 | Fill #0

## 2019-12-21 MED FILL — hydrOXYzine HCL 25 MG TABS: 25 | 10 days supply | Qty: 30 | Fill #0

## 2020-01-10 ENCOUNTER — Ambulatory Visit: Payer: Medicaid Other | Attending: Internal Medicine

## 2020-01-10 DIAGNOSIS — Z23 Encounter for immunization: Secondary | ICD-10-CM

## 2020-01-10 NOTE — Progress Notes (Signed)
   Covid-19 Vaccination Clinic  Name:  Kawanis Bonetti    MRN: [id number] DOB: December 15, 1975  01/10/2020  Ms. Bequette was observed post Covid-19 immunization for 15 minutes without incident. She was provided with Vaccine Information Sheet and instruction to access the V-Safe system.   Ms. Bensch was instructed to call 911 with any severe reactions post vaccine: Marland Kitchen Difficulty breathing  . Swelling of face and throat  . A fast heartbeat  . A bad rash all over body  . Dizziness and weakness   Immunizations Administered    Name Date Dose VIS Date Route   Pfizer COVID-19 Vaccine 01/10/2020  4:26 PM 0.3 mL 09/21/2019 Intramuscular   Manufacturer: [place]   Lot: [id number]   [place]: [id number]

## 2020-02-05 ENCOUNTER — Ambulatory Visit: Payer: Medicaid Other | Attending: Internal Medicine

## 2020-02-05 DIAGNOSIS — Z23 Encounter for immunization: Secondary | ICD-10-CM

## 2020-02-05 NOTE — Progress Notes (Signed)
   Covid-19 Vaccination Clinic  Name:  Tayia Steidinger    MRN: [id number] DOB: 07/17/1976  02/05/2020  Ms. Dieckman was observed post Covid-19 immunization for 15 minutes without incident. She was provided with Vaccine Information Sheet and instruction to access the V-Safe system.   Ms. Provenzano was instructed to call 911 with any severe reactions post vaccine: Marland Kitchen Difficulty breathing  . Swelling of face and throat  . A fast heartbeat  . A bad rash all over body  . Dizziness and weakness   Immunizations Administered    Name Date Dose VIS Date Route   Pfizer COVID-19 Vaccine 02/05/2020  1:36 PM 0.3 mL 12/05/2018 Intramuscular   Manufacturer: [place]   Lot: [id number]   [place]: [id number]

## 2020-03-24 ENCOUNTER — Other Ambulatory Visit: Payer: Self-pay | Admitting: Family Medicine

## 2020-03-24 DIAGNOSIS — Z794 Long term (current) use of insulin: Secondary | ICD-10-CM

## 2020-03-24 DIAGNOSIS — E1165 Type 2 diabetes mellitus with hyperglycemia: Secondary | ICD-10-CM

## 2020-03-24 MED FILL — hydrOXYzine HCL 25 MG TABS: 25 | 10 days supply | Qty: 30 | Fill #1

## 2020-03-24 MED FILL — GABAPENTIN 300 MG CAPSULE: 300 | 30 days supply | Qty: 120 | Fill #4

## 2020-03-25 MED FILL — JARDIANCE 10 MG TABLET: 10 | 30 days supply | Qty: 30 | Fill #0

## 2020-04-30 ENCOUNTER — Other Ambulatory Visit: Payer: Self-pay | Admitting: Family Medicine

## 2020-04-30 DIAGNOSIS — G8929 Other chronic pain: Secondary | ICD-10-CM

## 2020-04-30 DIAGNOSIS — E1142 Type 2 diabetes mellitus with diabetic polyneuropathy: Secondary | ICD-10-CM

## 2020-04-30 DIAGNOSIS — F411 Generalized anxiety disorder: Secondary | ICD-10-CM

## 2020-04-30 MED ORDER — SERTRALINE HCL 50 MG PO TABS: 50.0000 mg | ORAL_TABLET | Freq: Every day | ORAL | 0 refills | Status: DC

## 2020-04-30 MED ORDER — GABAPENTIN 300 MG PO CAPS: 300.0000 mg | ORAL_CAPSULE | Freq: Four times a day (QID) | ORAL | 1 refills | Status: DC

## 2020-04-30 MED FILL — GABAPENTIN 300 MG CAPSULE: 300 | 30 days supply | Qty: 120 | Fill #0

## 2020-04-30 MED FILL — SERTRALINE HCL 50 MG TABLET: 50 | 30 days supply | Qty: 30 | Fill #0

## 2020-04-30 NOTE — Telephone Encounter (Signed)
Medication Refill - Medication: sertraline (ZOLOFT) 50 MG tablet gabapentin (NEURONTIN) 300 MG capsule     Preferred Pharmacy (with phone number or street name):  [facility] [address] Phone:  [phone]  Fax:  [phone]       Agent: Please be advised that RX refills may take up to 3 business days. We ask that you follow-up with your pharmacy.

## 2020-06-11 ENCOUNTER — Encounter: Payer: Self-pay | Admitting: Family Medicine

## 2020-06-12 ENCOUNTER — Other Ambulatory Visit: Payer: Self-pay | Admitting: Family Medicine

## 2020-06-12 DIAGNOSIS — G8929 Other chronic pain: Secondary | ICD-10-CM

## 2020-06-12 DIAGNOSIS — F411 Generalized anxiety disorder: Secondary | ICD-10-CM

## 2020-06-12 DIAGNOSIS — E1142 Type 2 diabetes mellitus with diabetic polyneuropathy: Secondary | ICD-10-CM

## 2020-06-12 MED ORDER — GABAPENTIN 300 MG PO CAPS: 300.0000 mg | ORAL_CAPSULE | Freq: Four times a day (QID) | ORAL | 0 refills | Status: DC

## 2020-06-12 MED ORDER — SERTRALINE HCL 50 MG PO TABS: 50.0000 mg | ORAL_TABLET | Freq: Every day | ORAL | 0 refills | Status: DC

## 2020-06-12 MED FILL — GABAPENTIN 300 MG CAPSULE: 300 | 30 days supply | Qty: 120 | Fill #0

## 2020-06-12 MED FILL — SERTRALINE HCL 50 MG TABLET: 50 | 30 days supply | Qty: 30 | Fill #0

## 2020-06-12 NOTE — Progress Notes (Signed)
Patient ID: Michelle Gibson, female   DOB: 1976-08-19, 44 y.o.   MRN: [id number]   MyChart message received that patient needing refill of gabapentin and sertraline.  She reports that she has been unable to make follow-up appointment due to her work schedule.  We will send in 30-day supply of each medication but patient needs to make follow-up appointment in follow-up of chronic medical issues as she has not been seen for chronic issues since February of this year.

## 2020-06-24 ENCOUNTER — Encounter: Payer: Self-pay | Admitting: Family

## 2020-06-24 ENCOUNTER — Other Ambulatory Visit: Payer: Self-pay

## 2020-06-24 ENCOUNTER — Other Ambulatory Visit: Payer: Self-pay | Admitting: Family

## 2020-06-24 ENCOUNTER — Ambulatory Visit: Payer: Self-pay | Attending: Family | Admitting: Family

## 2020-06-24 VITALS — BP 107/71 | HR 63 | Temp 97.3°F | Resp 18 | Ht 66.0 in | Wt 175.6 lb

## 2020-06-24 DIAGNOSIS — F411 Generalized anxiety disorder: Secondary | ICD-10-CM

## 2020-06-24 DIAGNOSIS — E1165 Type 2 diabetes mellitus with hyperglycemia: Secondary | ICD-10-CM

## 2020-06-24 DIAGNOSIS — Z794 Long term (current) use of insulin: Secondary | ICD-10-CM

## 2020-06-24 DIAGNOSIS — E1142 Type 2 diabetes mellitus with diabetic polyneuropathy: Secondary | ICD-10-CM

## 2020-06-24 DIAGNOSIS — G8929 Other chronic pain: Secondary | ICD-10-CM

## 2020-06-24 DIAGNOSIS — R519 Headache, unspecified: Secondary | ICD-10-CM

## 2020-06-24 LAB — POCT GLYCOSYLATED HEMOGLOBIN (HGB A1C): Hemoglobin A1C: 6.2 % — AB (ref 4.0–5.6)

## 2020-06-24 LAB — GLUCOSE, POCT (MANUAL RESULT ENTRY): POC Glucose: 117 mg/dl — AB (ref 70–99)

## 2020-06-24 MED ORDER — GABAPENTIN 300 MG PO CAPS: 600.0000 mg | ORAL_CAPSULE | Freq: Two times a day (BID) | ORAL | 2 refills | Status: DC

## 2020-06-24 MED ORDER — SUMATRIPTAN SUCCINATE 25 MG PO TABS: ORAL_TABLET | ORAL | 0 refills | Status: DC

## 2020-06-24 MED ORDER — EMPAGLIFLOZIN 10 MG PO TABS: 10.0000 mg | ORAL_TABLET | Freq: Every day | ORAL | 2 refills | Status: DC

## 2020-06-24 MED ORDER — SERTRALINE HCL 100 MG PO TABS: 100.0000 mg | ORAL_TABLET | Freq: Every day | ORAL | 2 refills | Status: DC

## 2020-06-24 MED FILL — GABAPENTIN 300 MG CAPSULE: 300 | 30 days supply | Qty: 120 | Fill #0

## 2020-06-24 MED FILL — SUMATRIPTAN SUCC 25 MG TAB: 25 | 12 days supply | Qty: 9 | Fill #0

## 2020-06-24 MED FILL — SERTRALINE HCL 100 MG TAB: 100 | 30 days supply | Qty: 30 | Fill #0

## 2020-06-24 NOTE — Progress Notes (Signed)
Headaches daily, worse x 3 months, radiating from spine pain  Stopped taking insulin due to weight loss and low blood sugars  Blood sugar 117, A1C 6.2

## 2020-06-24 NOTE — Patient Instructions (Addendum)
Jardiance for diabetes. Gabapentin for diabetic neuropathy. Sertraline for anxiety. Imitrex for headache. Follow-up in 4 to 6 weeks for anxiety and headache. Follow-up in 3 months for management of chronic conditions. Lab today. Diabetes Basics  Diabetes (diabetes mellitus) is a long-term (chronic) disease. It occurs when the body does not properly use sugar (glucose) that is released from food after you eat. Diabetes may be caused by one or both of these problems:  Your pancreas does not make enough of a hormone called insulin.  Your body does not react in a normal way to insulin that it makes. Insulin lets sugars (glucose) go into cells in your body. This gives you energy. If you have diabetes, sugars cannot get into cells. This causes high blood sugar (hyperglycemia). Follow these instructions at home: How is diabetes treated? You may need to take insulin or other diabetes medicines daily to keep your blood sugar in balance. Take your diabetes medicines every day as told by your doctor. List your diabetes medicines here: Diabetes medicines  Name of medicine: ______________________________ ? Amount (dose): _______________ Time (a.m./p.m.): _______________ Notes: ___________________________________  Name of medicine: ______________________________ ? Amount (dose): _______________ Time (a.m./p.m.): _______________ Notes: ___________________________________  Name of medicine: ______________________________ ? Amount (dose): _______________ Time (a.m./p.m.): _______________ Notes: ___________________________________ If you use insulin, you will learn how to give yourself insulin by injection. You may need to adjust the amount based on the food that you eat. List the types of insulin you use here: Insulin  Insulin type: ______________________________ ? Amount (dose): _______________ Time (a.m./p.m.): _______________ Notes: ___________________________________  Insulin type:  ______________________________ ? Amount (dose): _______________ Time (a.m./p.m.): _______________ Notes: ___________________________________  Insulin type: ______________________________ ? Amount (dose): _______________ Time (a.m./p.m.): _______________ Notes: ___________________________________  Insulin type: ______________________________ ? Amount (dose): _______________ Time (a.m./p.m.): _______________ Notes: ___________________________________  Insulin type: ______________________________ ? Amount (dose): _______________ Time (a.m./p.m.): _______________ Notes: ___________________________________ How do I manage my blood sugar?  Check your blood sugar levels using a blood glucose monitor as directed by your doctor. Your doctor will set treatment goals for you. Generally, you should have these blood sugar levels:  Before meals (preprandial): 80-130 mg/dL (4.4-7.2 mmol/L).  After meals (postprandial): below 180 mg/dL (10 mmol/L).  A1c level: less than 7%. Write down the times that you will check your blood sugar levels: Blood sugar checks  Time: _______________ Notes: ___________________________________  Time: _______________ Notes: ___________________________________  Time: _______________ Notes: ___________________________________  Time: _______________ Notes: ___________________________________  Time: _______________ Notes: ___________________________________  Time: _______________ Notes: ___________________________________  What do I need to know about low blood sugar? Low blood sugar is called hypoglycemia. This is when blood sugar is at or below 70 mg/dL (3.9 mmol/L). Symptoms may include:  Feeling: ? Hungry. ? Worried or nervous (anxious). ? Sweaty and clammy. ? Confused. ? Dizzy. ? Sleepy. ? Sick to your stomach (nauseous).  Having: ? A fast heartbeat. ? A headache. ? A change in your vision. ? Tingling or no feeling (numbness) around the mouth, lips, or  tongue. ? Jerky movements that you cannot control (seizure).  Having trouble with: ? Moving (coordination). ? Sleeping. ? Passing out (fainting). ? Getting upset easily (irritability). Treating low blood sugar To treat low blood sugar, eat or drink something sugary right away. If you can think clearly and swallow safely, follow the 15:15 rule:  Take 15 grams of a fast-acting carb (carbohydrate). Talk with your doctor about how much you should take.  Some fast-acting carbs are: ? Sugar tablets (glucose pills). Take 3-4 glucose  pills. ? 6-8 pieces of hard candy. ? 4-6 oz (120-150 mL) of fruit juice. ? 4-6 oz (120-150 mL) of regular (not diet) soda. ? 1 Tbsp (15 mL) honey or sugar.  Check your blood sugar 15 minutes after you take the carb.  If your blood sugar is still at or below 70 mg/dL (3.9 mmol/L), take 15 grams of a carb again.  If your blood sugar does not go above 70 mg/dL (3.9 mmol/L) after 3 tries, get help right away.  After your blood sugar goes back to normal, eat a meal or a snack within 1 hour. Treating very low blood sugar If your blood sugar is at or below 54 mg/dL (3 mmol/L), you have very low blood sugar (severe hypoglycemia). This is an emergency. Do not wait to see if the symptoms will go away. Get medical help right away. Call your local emergency services (911 in the [place] Do not drive yourself to the hospital. Questions to ask your health care provider  Do I need to meet with a diabetes educator?  What equipment will I need to care for myself at home?  What diabetes medicines do I need? When should I take them?  How often do I need to check my blood sugar?  What number can I call if I have questions?  When is my next doctor's visit?  Where can I find a support group for people with diabetes? Where to find more information  American Diabetes Association: www.diabetes.org  American Association of Diabetes Educators:  www.diabeteseducator.org/patient-resources Contact a doctor if:  Your blood sugar is at or above 240 mg/dL (13.3 mmol/L) for 2 days in a row.  You have been sick or have had a fever for 2 days or more, and you are not getting better.  You have any of these problems for more than 6 hours: ? You cannot eat or drink. ? You feel sick to your stomach (nauseous). ? You throw up (vomit). ? You have watery poop (diarrhea). Get help right away if:  Your blood sugar is lower than 54 mg/dL (3 mmol/L).  You get confused.  You have trouble: ? Thinking clearly. ? Breathing. Summary  Diabetes (diabetes mellitus) is a long-term (chronic) disease. It occurs when the body does not properly use sugar (glucose) that is released from food after digestion.  Take insulin and diabetes medicines as told.  Check your blood sugar every day, as often as told.  Keep all follow-up visits as told by your doctor. This is important. This information is not intended to replace advice given to you by your health care provider. Make sure you discuss any questions you have with your health care provider. Document Revised: 06/20/2019 Document Reviewed: 12/30/2017 Elsevier Patient Education  [place].

## 2020-06-24 NOTE — Progress Notes (Signed)
Patient ID: Michelle Gibson, female   DOB: Feb 08, 1976  MRN: [id number]  CC: Diabetes Follow-Up  Subjective: Michelle Gibson is a 44 y.o. female with history of acute respiratory failure with hypoxia, bronchospasm, and hyperglycemia who presents for diabetes follow-up.  1. DIABETES TYPE 2 FOLLOW-UP: 11/16/2019: Encounter with Dr. Chapman Fitch. Patient with increased hemoglobin A1C of 8.4% from previous 7.7%. Presentation of urinary frequency, left mid back pain and mild suprapubic discomfort on exam suggestive of UTI. CMP and CBC obtained. Started on pre-meal regular insulin and to use to help with control of blood sugars. Follow-up with pharmacist in 1 - 2 weeks.  06/24/2020: Last A1C:    Are you fasting today: [x] No, tomato soup and water Have you taken your anti-diabetic medications today: [x] No but did take Gabapentin Med Adherence:  [] Yes    [x] No Medication side effects:  [x] Yes, reports having low blood sugars for at least the last 1 week. Reports blood sugars were in the 60's, states this has happened at least 4 times in the past 2 weeks. Reports at that time she took a glucose tablet and blood sugar increased to 120's. Report she has not taken Lantus and Novolog in at least 1 week Home Monitoring?  [x] Yes    [] No Home glucose results range: 120's Diet Adherence: [x] Yes    [] No Exercise: [x] Yes    [] No Hypoglycemic episodes?: [x] Yes    [] No Numbness of the feet? [x] Yes    [] No Retinopathy hx? [] Yes    [x] No Last eye exam: Years  2. ANXIETY FOLLOW-UP: 11/16/2019: Encounter with Dr. Chapman Fitch. Patient with increase in worry and anxiety. Started on Sertraline 100 mg in the past and was not taking medication. Agreed to restart at 50 mg nightly and to return for reevaluation to see if dose is sufficient. Declined psych and counseling referral.  06/24/2020: Reports does not feel that Sertraline 50 mg is helping. Reports most recent anxiety related to the loss of her nephew. Denies thoughts  of self harm and suicidal ideation.  3. HEADACHE: Description of Headaches: Location of pain: occipital Radiation of pain?:back Character of pain:sharp and throbbing Severity of pain: 10 Accompanying symptoms: nausea, sensitive to light and loud noises Rapidity of onset: gradual Typical duration of individual headache: until she takes medication  Temporal Pattern of Headaches: Started having HAs: years ago Worst time of day: evening Awaken from sleep?: yes - sometimes  Current Use of Meds to Treat HA: Abortive meds? acetaminophen, Excedrin migraine  Additional Relevant History: History of head/neck trauma? no History of head/neck surgery? no    Patient Active Problem List   Diagnosis Date Noted  . Acute tear lateral meniscus, right, initial encounter 08/29/2019  . Acute medial meniscus tear of right knee 08/22/2019  . Fever and chills 11/17/2018  . Closed fracture of upper end of fibula 09/21/2018  . WPW (Wolff-Parkinson-White syndrome) 10/10/2017  . Asthma with acute exacerbation 09/15/2016  . Hyperglycemia 09/15/2016  . History of Wolff-Parkinson-White (WPW) syndrome 09/15/2016  . Acute respiratory failure with hypoxia ([place]) 09/15/2016  . Bronchospasm 09/11/2016     Current Outpatient Medications on File Prior to Visit  Medication Sig Dispense Refill  . Blood Glucose Monitoring Suppl (TRUE METRIX AIR GLUCOSE METER) w/Device KIT 1 kit by Does not apply route 2 (two) times daily. 1 kit 0  . empagliflozin (JARDIANCE) 10 MG TABS tablet Take 1 tablet (10 mg total) by  mouth daily. Must have office visit for refills 30 tablet 0  . gabapentin (NEURONTIN) 300 MG capsule Take 1 capsule (300 mg total) by mouth 4 (four) times daily. As needed; needs office visit 120 capsule 0  . glucose blood (TRUE METRIX BLOOD GLUCOSE TEST) test strip Use as instructed to check blood sugars twice daily and as needed 100 each 6  . hydrOXYzine (ATARAX/VISTARIL) 25 MG tablet Take 1 tablet (25 mg  total) by mouth 3 (three) times daily as needed for anxiety. 30 tablet 3  . ibuprofen (ADVIL) 800 MG tablet Take 1 tablet (800 mg total) by mouth every 8 (eight) hours as needed. 60 tablet 0  . insulin aspart (NOVOLOG FLEXPEN) 100 UNIT/ML FlexPen 5 units before meals and if blood sugar at 250 or higher 15 mL 11  . Insulin Glargine (LANTUS SOLOSTAR) 100 UNIT/ML Solostar Pen Inject 35 units once per day 5 pen 2  . methocarbamol (ROBAXIN) 500 MG tablet Take 2 tablets (1,000 mg total) by mouth every 8 (eight) hours as needed for muscle spasms. 90 tablet 1  . metoprolol tartrate (LOPRESSOR) 100 MG tablet Take 1 tablet (100 mg total) by mouth once for 1 dose. Take 1-2 hours prior to your cardiac CT scan appointment time. 1 tablet 0  . mometasone-formoterol (DULERA) 200-5 MCG/ACT AERO Inhale 2 puffs into the lungs 2 (two) times daily. (Patient taking differently: Inhale 2 puffs into the lungs as needed for wheezing or shortness of breath. ) 39 Inhaler 3  . Multiple Vitamins-Calcium (ONE-A-DAY WOMENS PO) Take 1 tablet by mouth daily.    . ondansetron (ZOFRAN ODT) 4 MG disintegrating tablet Take 1 tablet (4 mg total) by mouth every 8 (eight) hours as needed for nausea or vomiting. 10 tablet 0  . ondansetron (ZOFRAN) 4 MG tablet Take 1-2 tablets (4-8 mg total) by mouth every 8 (eight) hours as needed for nausea or vomiting. 40 tablet 0  . pantoprazole (PROTONIX) 40 MG tablet Take 1 tablet (40 mg total) by mouth 2 (two) times daily. To reduce stomach acid 180 tablet 0  . sertraline (ZOLOFT) 50 MG tablet Take 1 tablet (50 mg total) by mouth daily. Needs office visit 30 tablet 0  . traMADol (ULTRAM) 50 MG tablet Take 1 tablet (50 mg total) by mouth every 6 (six) hours as needed. (Patient not taking: Reported on 11/16/2019) 30 tablet 0  . TRUEplus Lancets 28G MISC USE TWICE DAILY WHEN CHECKING BLOOD SUGARS AND AS NEEDED 100 each 6  . [DISCONTINUED] metFORMIN (GLUCOPHAGE) 500 MG tablet Take 1 tablet (500 mg total) by  mouth 2 (two) times daily with a meal. 60 tablet 6  . [DISCONTINUED] traZODone (DESYREL) 50 MG tablet Take 0.5-1 tablets (25-50 mg total) by mouth at bedtime as needed for sleep. 30 tablet 3   Current Facility-Administered Medications on File Prior to Visit  Medication Dose Route Frequency Provider Last Rate Last Admin  . ipratropium-albuterol (DUONEB) 0.5-2.5 (3) MG/3ML nebulizer solution 3 mL  3 mL Nebulization Q20 Min PRN Fredia Beets R, FNP   3 mL at 11/14/17 1615    Allergies  Allergen Reactions  . Ciprofloxacin Hives  . Flecainide Hives, Nausea And Vomiting and Rash  . Penicillin G Rash    Has patient had a PCN reaction causing immediate rash, facial/tongue/throat swelling, SOB or lightheadedness with hypotension: yes Has patient had a PCN reaction causing severe rash involving mucus membranes or skin necrosis: yes Has patient had a PCN reaction that required hospitalization: no Has  patient had a PCN reaction occurring within the last 10 years: no If all of the above answers are "NO", then may proceed with Cephalosporin use.     Social History   Socioeconomic History  . Marital status: Divorced    Spouse name: Not on file  . Number of children: Not on file  . Years of education: Not on file  . Highest education level: Not on file  Occupational History  . Not on file  Tobacco Use  . Smoking status: Former Smoker    Packs/day: 0.10    Types: Cigarettes  . Smokeless tobacco: Never Used  Vaping Use  . Vaping Use: Never used  Substance and Sexual Activity  . Alcohol use: No  . Drug use: No  . Sexual activity: Yes  Other Topics Concern  . Not on file  Social History Narrative  . Not on file   Social Determinants of Health   Financial Resource Strain:   . Difficulty of Paying Living Expenses: Not on file  Food Insecurity:   . Worried About Charity fundraiser in the Last Year: Not on file  . Ran Out of Food in the Last Year: Not on file  Transportation  Needs:   . Lack of Transportation (Medical): Not on file  . Lack of Transportation (Non-Medical): Not on file  Physical Activity:   . Days of Exercise per Week: Not on file  . Minutes of Exercise per Session: Not on file  Stress:   . Feeling of Stress : Not on file  Social Connections:   . Frequency of Communication with Friends and Family: Not on file  . Frequency of Social Gatherings with Friends and Family: Not on file  . Attends Religious Services: Not on file  . Active Member of Clubs or Organizations: Not on file  . Attends Archivist Meetings: Not on file  . Marital Status: Not on file  Intimate Partner Violence:   . Fear of Current or Ex-Partner: Not on file  . Emotionally Abused: Not on file  . Physically Abused: Not on file  . Sexually Abused: Not on file    Family History  Problem Relation Age of Onset  . Hypertension Mother   . CAD Mother 5  . Diabetes Mother   . Hypertension Father   . CAD Father 24       CABG    Past Surgical History:  Procedure Laterality Date  . ABDOMINAL HYSTERECTOMY    . CARDIAC SURGERY    . CESAREAN SECTION    . KNEE ARTHROSCOPY WITH LATERAL MENISECTOMY Right 08/29/2019   Procedure: PARTIAL LATERAL MENISECTOMY;  Surgeon: Leandrew Koyanagi, MD;  Location: [facility];  Service: Orthopedics;  Laterality: Right;  . KNEE ARTHROSCOPY WITH MEDIAL MENISECTOMY Right 08/29/2019   Procedure: RIGHT KNEE ARTHROSCOPY WITH PARTIAL MEDIAL MENISECTOMY;  Surgeon: Leandrew Koyanagi, MD;  Location: [facility];  Service: Orthopedics;  Laterality: Right;    ROS: Review of Systems Negative except as stated above  PHYSICAL EXAM: Vitals with BMI 06/24/2020 12/17/2019 12/17/2019  Height 5' 6" - -  Weight 175 lbs 10 oz - -  BMI 76.28 - -  Systolic 315 176 160  Diastolic 71 77 70  Pulse 63 74 56   Wt Readings from Last 3 Encounters:  06/24/20 175 lb 9.6 oz (79.7 kg)  11/16/19 196 lb 12.8 oz (89.3 kg)  10/26/19 196 lb (88.9  kg)   Physical Exam General appearance -  alert, well appearing, and in no distress Mental status - alert, oriented to person, place, and time, normal mood, behavior, speech, dress, motor activity, and thought processes Eyes - pupils equal and reactive, extraocular eye movements intact Ears - bilateral TM's and external ear canals normal Nose - normal and patent, no erythema, discharge or polyps Neck - supple, no significant adenopathy Lymphatics - no palpable lymphadenopathy, no hepatosplenomegaly Chest - clear to auscultation, no wheezes, rales or rhonchi, symmetric air entry, no tachypnea, retractions or cyanosis Heart - normal rate, regular rhythm, normal S1, S2, no murmurs, rubs, clicks or gallops Neurological - alert, oriented, normal speech, no focal findings or movement disorder noted, cranial nerves II through XII intact, motor and sensory grossly normal bilaterally, normal muscle tone, no tremors, strength 5/5 Extremities - peripheral pulses normal, no pedal edema, no clubbing or cyanosis Skin - normal coloration and turgor, no rashes, no suspicious skin lesions noted DM Foot Exam Color: normal Sensation Monofilament:diminished right and left dorsal, plantar, toe and distal Sensation Vibration:diminished right and left dorsal, plantar, toe and distal Circulation: Pulses normal right and left pedal and posterial tibial  Lesions: none dorsal, plantar, toe and distal  Results for orders placed or performed in visit on 06/24/20  Glucose (CBG)  Result Value Ref Range   POC Glucose 117 (A) 70 - 99 mg/dl  POCT glycosylated hemoglobin (Hb A1C)  Result Value Ref Range   Hemoglobin A1C 6.2 (A) 4.0 - 5.6 %   HbA1c POC (<> result, manual entry)     HbA1c, POC (prediabetic range)     HbA1c, POC (controlled diabetic range)      ASSESSMENT AND PLAN: 1. Type 2 diabetes mellitus with hyperglycemia, with long-term current use of insulin ([place]): - Controlled. - Hemoglobin A1C at goal today  at 6.2%, goal < 7%. Improved from previous hemoglobin A1C of 8.4% 7 months ago. - CBG normal and patient non-fasting. - Continue Empagliflozin as prescribed for diabetes.  - Discontinue Insulin Glargine and Insulin Aspart as diabetes controlled at this time. - To achieve an A1C goal of less than or equal to 7.0 percent, a fasting blood sugar of 80 to 130 mg/dL and a postprandial glucose (90 to 120 minutes after a meal) less than 180 mg/dL. In the event of sugars less than 60 mg/dl or greater than 400 mg/dl please notify the clinic ASAP. It is recommended that you undergo annual eye exams and annual foot exams. - Discussed the importance of healthy eating habits, low-carbohydrate diet, low-sugar diet, regular aerobic exercise (at least 150 minutes a week as tolerated) and medication compliance to achieve or maintain control of diabetes. - BMP to check kidney function and electrolyte balance. - Follow-up with primary physician in 3 months or sooner if needed. - Glucose (CBG) - POCT glycosylated hemoglobin (Hb T6L) - Basic Metabolic Panel - empagliflozin (JARDIANCE) 10 MG TABS tablet; Take 1 tablet (10 mg total) by mouth daily. Must have office visit for refills  Dispense: 30 tablet; Refill: 2  2. Diabetic peripheral neuropathy associated with type 2 diabetes mellitus ([place]): - Continue Gabapentin for diabetic neuropathy. - Follow-up with primary physician in 3 months or sooner if needed. - gabapentin (NEURONTIN) 300 MG capsule; Take 2 capsules (600 mg total) by mouth 2 (two) times daily. As needed; needs office visit  Dispense: 120 capsule; Refill: 2  3. GAD (generalized anxiety disorder): - Reports Sertraline 50 mg daily not helping much. - Increase Sertraline from 50 mg/daily to 100 mg/daily.  -  Denies thoughts of self-harm and suicidal ideation. -  Monitor for signs and symptoms of serotonin syndrome/serotonin toxicity (eg, hyperreflexia, clonus, hyperthermia, diaphoresis, tremor, autonomic  instability, mental status changes). - Follow-up in 4 to 6 weeks for reevaluation of increase in dosage of medication.  - sertraline (ZOLOFT) 100 MG tablet; Take 1 tablet (100 mg total) by mouth daily. Needs office visit  Dispense: 30 tablet; Refill: 2  4. Chronic nonintractable headache, unspecified headache type: - Patient with history of headaches for years. - Sumatriptan for headache. - Follow-up with primary physician in 2 - 4 weeks or sooner if needed. - SUMAtriptan (IMITREX) 25 MG tablet; 25 mg 1 tab PO at the start of the headache. May repeat in 2 hours x 1 if headache persists. Max of 2 tabs/24 hours  Dispense: 20 tablet; Refill: 0   Patient was given the opportunity to ask questions.  Patient verbalized understanding of the plan and was able to repeat key elements of the plan. Patient was given clear instructions to go to Emergency Department or return to medical center if symptoms don't improve, worsen, or new problems develop.The patient verbalized understanding.  Camillia Herter, NP

## 2020-06-25 LAB — BASIC METABOLIC PANEL
BUN/Creatinine Ratio: 16 (ref 9–23)
BUN: 13 mg/dL (ref 6–24)
CO2: 26 mmol/L (ref 20–29)
Calcium: 9.5 mg/dL (ref 8.7–10.2)
Chloride: 107 mmol/L — ABNORMAL HIGH (ref 96–106)
Creatinine, Ser: 0.83 mg/dL (ref 0.57–1.00)
GFR calc Af Amer: 101 mL/min/{1.73_m2} (ref >59)
GFR calc non Af Amer: 88 mL/min/{1.73_m2} (ref >59)
Glucose: 93 mg/dL (ref 65–99)
Potassium: 4.4 mmol/L (ref 3.5–5.2)
Sodium: 144 mmol/L (ref 134–144)

## 2020-06-26 MED ORDER — ONDANSETRON HCL 4 MG PO TABS: 4.0000 mg | ORAL_TABLET | Freq: Three times a day (TID) | ORAL | 0 refills | Status: DC | PRN

## 2020-06-26 NOTE — Addendum Note (Signed)
Addended by: Camillia Herter on: 06/26/2020 02:47 PM   Modules accepted: Orders

## 2020-06-26 NOTE — Progress Notes (Signed)
Please call patient with update.   Kidney function normal.   Management plan for diabetes discussed in clinic.

## 2020-06-26 NOTE — Progress Notes (Signed)
Please call patient with update.   MRI of brain ordered per patient request.   Zofran prescribed for nausea and sent to pharmacy on file.   If patient experiencing severe or life threatening symptoms she should report to the emergency department immediately for medical evaluation.

## 2020-06-27 ENCOUNTER — Ambulatory Visit: Payer: Medicaid Other | Admitting: Orthopaedic Surgery

## 2020-06-27 MED FILL — ONDANSETRON HCL 4 MG TABLET: 4 | 13 days supply | Qty: 40 | Fill #0

## 2020-07-01 ENCOUNTER — Telehealth: Payer: Self-pay

## 2020-07-01 NOTE — Telephone Encounter (Signed)
Communication sent via [facility] w/ imaging appt info per pt request

## 2020-07-01 NOTE — Telephone Encounter (Signed)
Letter w/ imaging appt info sent via [facility]

## 2020-07-01 NOTE — Telephone Encounter (Signed)
Spoke w/ [facility] and confirmed case was denied due to the facility, pt has [employer] (ID: [id number]) which does not cover imaging at [place], location changed to [place] and [place] approved 07/01/20-09/29/20. Auth #: [id number] at [facility] for CPT codes: [phone]- MRI Brain w/wo contrast and SOCCPT. Will contact pt and facility for scheduling.

## 2020-07-01 NOTE — Telephone Encounter (Signed)
Called [facility], gave auth # and scheduled 1st available appt for Sun 07/20/20 @ 12:40p with a 12:20p arrival time. ATC pt to give appt info, no answer, [facility], VM full.

## 2020-07-01 NOTE — Telephone Encounter (Signed)
Pt called back, information relayed. Pt wants this info uploaded on her mychart. Please advise

## 2020-07-01 NOTE — Telephone Encounter (Signed)
Spoke w/ [facility] and confirmed case was denied due to the facility, pt has [employer] (ID: [id number]) which does not cover imaging at [place], location changed to [place] and [place] approved 07/01/20-09/29/20. Auth #: [id number] at [facility] for CPT codes: [phone]- MRI Brain w/wo contrast and SOCCPT. Will contact pt and facility for scheduling.

## 2020-07-01 NOTE — Telephone Encounter (Signed)
ATC pt to give imaging appt info, no answer, [facility]TL[facility], VM full.

## 2020-07-01 NOTE — Telephone Encounter (Signed)
-----   Message from Eddie Dibbles, RN sent at 07/01/2020  9:02 AM EDT ----- Regarding: MRI Follow-up MRI denied, [facility] systems down, unable to find out reason for denial, call [employer] this afternoon for follow-up. [phone].

## 2020-07-01 NOTE — Telephone Encounter (Signed)
-----   Message from Eddie Dibbles, RN sent at 07/01/2020  9:02 AM EDT ----- Regarding: MRI Follow-up MRI denied, [employer] systems down, unable to find out reason for denial, call [employer] this afternoon for follow-up. [phone].

## 2020-07-02 ENCOUNTER — Encounter (HOSPITAL_COMMUNITY): Payer: Self-pay

## 2020-07-02 ENCOUNTER — Other Ambulatory Visit: Payer: Self-pay

## 2020-07-02 ENCOUNTER — Emergency Department (HOSPITAL_COMMUNITY)
Admission: EM | Admit: 2020-07-02 | Discharge: 2020-07-02 | Disposition: A | Discharge status: Home / Self Care | Payer: Managed Care, Other (non HMO) | Attending: Emergency Medicine | Admitting: Emergency Medicine

## 2020-07-02 ENCOUNTER — Emergency Department (HOSPITAL_COMMUNITY): Payer: Managed Care, Other (non HMO)

## 2020-07-02 DIAGNOSIS — Z87891 Personal history of nicotine dependence: Secondary | ICD-10-CM | POA: Diagnosis not present

## 2020-07-02 DIAGNOSIS — R202 Paresthesia of skin: Secondary | ICD-10-CM | POA: Diagnosis not present

## 2020-07-02 DIAGNOSIS — Z79899 Other long term (current) drug therapy: Secondary | ICD-10-CM | POA: Diagnosis not present

## 2020-07-02 DIAGNOSIS — H539 Unspecified visual disturbance: Secondary | ICD-10-CM | POA: Insufficient documentation

## 2020-07-02 DIAGNOSIS — J45901 Unspecified asthma with (acute) exacerbation: Secondary | ICD-10-CM | POA: Diagnosis not present

## 2020-07-02 DIAGNOSIS — G43109 Migraine with aura, not intractable, without status migrainosus: Secondary | ICD-10-CM | POA: Diagnosis not present

## 2020-07-02 DIAGNOSIS — G43809 Other migraine, not intractable, without status migrainosus: Secondary | ICD-10-CM | POA: Insufficient documentation

## 2020-07-02 DIAGNOSIS — Z794 Long term (current) use of insulin: Secondary | ICD-10-CM | POA: Insufficient documentation

## 2020-07-02 DIAGNOSIS — E1165 Type 2 diabetes mellitus with hyperglycemia: Secondary | ICD-10-CM | POA: Insufficient documentation

## 2020-07-02 DIAGNOSIS — R519 Headache, unspecified: Secondary | ICD-10-CM | POA: Diagnosis present

## 2020-07-02 LAB — COMPREHENSIVE METABOLIC PANEL
ALT: 21 U/L (ref 0–44)
AST: 22 U/L (ref 15–41)
Albumin: 3.9 g/dL (ref 3.5–5.0)
Alkaline Phosphatase: 88 U/L (ref 38–126)
Anion gap: 12 (ref 5–15)
BUN: 15 mg/dL (ref 6–20)
CO2: 23 mmol/L (ref 22–32)
Calcium: 9.1 mg/dL (ref 8.9–10.3)
Chloride: 105 mmol/L (ref 98–111)
Creatinine, Ser: 1.04 mg/dL — ABNORMAL HIGH (ref 0.44–1.00)
GFR calc Af Amer: >60 mL/min (ref >60)
GFR calc non Af Amer: >60 mL/min (ref >60)
Glucose, Bld: 144 mg/dL — ABNORMAL HIGH (ref 70–99)
Potassium: 3.7 mmol/L (ref 3.5–5.1)
Sodium: 140 mmol/L (ref 135–145)
Total Bilirubin: 0.4 mg/dL (ref 0.3–1.2)
Total Protein: 6.9 g/dL (ref 6.5–8.1)

## 2020-07-02 LAB — I-STAT CHEM 8, ED
BUN: 16 mg/dL (ref 6–20)
Calcium, Ion: 1.16 mmol/L (ref 1.15–1.40)
Chloride: 106 mmol/L (ref 98–111)
Creatinine, Ser: 0.9 mg/dL (ref 0.44–1.00)
Glucose, Bld: 140 mg/dL — ABNORMAL HIGH (ref 70–99)
HCT: 39 % (ref 36.0–46.0)
Hemoglobin: 13.3 g/dL (ref 12.0–15.0)
Potassium: 3.6 mmol/L (ref 3.5–5.1)
Sodium: 142 mmol/L (ref 135–145)
TCO2: 24 mmol/L (ref 22–32)

## 2020-07-02 LAB — I-STAT BETA HCG BLOOD, ED (MC, WL, AP ONLY): I-stat hCG, quantitative: <5 m[IU]/mL (ref <5)

## 2020-07-02 LAB — DIFFERENTIAL
Abs Immature Granulocytes: 0 10*3/uL (ref 0.00–0.07)
Basophils Absolute: 0.1 10*3/uL (ref 0.0–0.1)
Basophils Relative: 1 %
Eosinophils Absolute: 0.2 10*3/uL (ref 0.0–0.5)
Eosinophils Relative: 3 %
Immature Granulocytes: 0 %
Lymphocytes Relative: 58 %
Lymphs Abs: 4.2 10*3/uL — ABNORMAL HIGH (ref 0.7–4.0)
Monocytes Absolute: 0.3 10*3/uL (ref 0.1–1.0)
Monocytes Relative: 4 %
Neutro Abs: 2.5 10*3/uL (ref 1.7–7.7)
Neutrophils Relative %: 34 %

## 2020-07-02 LAB — CBC
HCT: 39.6 % (ref 36.0–46.0)
Hemoglobin: 12.9 g/dL (ref 12.0–15.0)
MCH: 29.2 pg (ref 26.0–34.0)
MCHC: 32.6 g/dL (ref 30.0–36.0)
MCV: 89.6 fL (ref 80.0–100.0)
Platelets: 359 10*3/uL (ref 150–400)
RBC: 4.42 MIL/uL (ref 3.87–5.11)
RDW: 14.1 % (ref 11.5–15.5)
WBC: 7.3 10*3/uL (ref 4.0–10.5)
nRBC: 0 % (ref 0.0–0.2)

## 2020-07-02 LAB — APTT: aPTT: 29 seconds (ref 24–36)

## 2020-07-02 LAB — PROTIME-INR
INR: 1 (ref 0.8–1.2)
Prothrombin Time: 13 seconds (ref 11.4–15.2)

## 2020-07-02 LAB — ETHANOL: Alcohol, Ethyl (B): <10 mg/dL (ref <10)

## 2020-07-02 MED ORDER — SODIUM CHLORIDE 0.9 % IV BOLUS
1000.0000 mL | Freq: Once | INTRAVENOUS | Status: AC
  Administered 2020-07-02: 1000 mL via INTRAVENOUS

## 2020-07-02 MED ORDER — METOCLOPRAMIDE HCL 5 MG/ML IJ SOLN
10.0000 mg | Freq: Once | INTRAMUSCULAR | Status: AC
  Administered 2020-07-02: 10 mg via INTRAVENOUS
  Filled 2020-07-02: qty 2

## 2020-07-02 MED ORDER — IOHEXOL 350 MG/ML SOLN
60.0000 mL | Freq: Once | INTRAVENOUS | Status: AC | PRN
  Administered 2020-07-02: 60 mL via INTRAVENOUS

## 2020-07-02 MED ORDER — KETOROLAC TROMETHAMINE 15 MG/ML IJ SOLN
15.0000 mg | Freq: Once | INTRAMUSCULAR | Status: AC
  Administered 2020-07-02: 15 mg via INTRAVENOUS
  Filled 2020-07-02: qty 1

## 2020-07-02 MED ORDER — DIPHENHYDRAMINE HCL 50 MG/ML IJ SOLN
25.0000 mg | Freq: Once | INTRAMUSCULAR | Status: AC
  Administered 2020-07-02: 25 mg via INTRAVENOUS
  Filled 2020-07-02: qty 1

## 2020-07-02 NOTE — ED Triage Notes (Signed)
Pt BIB [facility] from work for a CODE STROKE. Pt LKW was 1430. Pt began to experience blurred vision in the left eye and left arm numbness/heaviness at 1445 and called EMS. Pt denies any pain at this time but states she cannot see out of her left eye. Pt has a hx of migraines.

## 2020-07-02 NOTE — Code Documentation (Signed)
Stroke Response Nurse Documentation Code Documentation  Michelle Gibson is a 44 y.o. female arriving to [facility] ED via [facility] on 07/02/20 with past medical hx of diabetes, Wolff-Parkinson-White syndrome status post ablation, interstitial cystitis status post stimulator placement. Code stroke was activated by EMS. Patient from work where she was LKW at 1430 and now complaining of left arm numbness, blurred vision and headache.   Stroke team at the bedside on patient arrival. Labs drawn and patient cleared for CT by Dr. Ron Parker. Patient to CT with team. NIHSS 4, see documentation for details and code stroke times. Patient with left hemianopia and left decreased sensation on exam.   No IV access, difficult stick. Unable to get MRI due to bladder stimulator. PIV placed in CT. The following imaging was completed: CT, CTA head and neck. Patient is not a candidate for tPA due to stroke not suspected. Care/Plan migraine medication. Bedside handoff with ED RN [place].    Leverne Humbles Stroke Response RN

## 2020-07-02 NOTE — ED Provider Notes (Signed)
[place] EMERGENCY DEPARTMENT Provider Note   CSN: [id number] Arrival date & time: 07/02/20  1544  An emergency department physician performed an initial assessment on this suspected stroke patient at 1544.  History Chief Complaint  Patient presents with  . Code Stroke    Michelle Gibson is a 44 y.o. female.   Cerebrovascular Accident This is a new problem. The current episode started 1 to 2 hours ago. The problem occurs constantly. The problem has not changed since onset.Associated symptoms include headaches. Pertinent negatives include no chest pain and no shortness of breath. Associated symptoms comments: Blurry vision in the left eye and numbness of the left arm. Nothing aggravates the symptoms. Nothing relieves the symptoms. She has tried nothing for the symptoms. The treatment provided no relief.       Past Medical History:  Diagnosis Date  . Acute tear lateral meniscus, right, initial encounter 08/29/2019  . Asthma   . Closed fracture of upper end of fibula 09/21/2018  . Diabetes mellitus without complication ([place])    TYPE II  . Hyperglycemia 09/15/2016  . WPW (Wolff-Parkinson-White syndrome)    s/p ablation at age 40    Patient Active Problem List   Diagnosis Date Noted  . Acute tear lateral meniscus, right, initial encounter 08/29/2019  . Acute medial meniscus tear of right knee 08/22/2019  . Fever and chills 11/17/2018  . Closed fracture of upper end of fibula 09/21/2018  . WPW (Wolff-Parkinson-White syndrome) 10/10/2017  . Asthma with acute exacerbation 09/15/2016  . Hyperglycemia 09/15/2016  . History of Wolff-Parkinson-White (WPW) syndrome 09/15/2016  . Acute respiratory failure with hypoxia ([place]) 09/15/2016  . Bronchospasm 09/11/2016    Past Surgical History:  Procedure Laterality Date  . ABDOMINAL HYSTERECTOMY    . CARDIAC SURGERY    . CESAREAN SECTION    . KNEE ARTHROSCOPY WITH LATERAL MENISECTOMY Right 08/29/2019   Procedure:  PARTIAL LATERAL MENISECTOMY;  Surgeon: Leandrew Koyanagi, MD;  Location: [facility];  Service: Orthopedics;  Laterality: Right;  . KNEE ARTHROSCOPY WITH MEDIAL MENISECTOMY Right 08/29/2019   Procedure: RIGHT KNEE ARTHROSCOPY WITH PARTIAL MEDIAL MENISECTOMY;  Surgeon: Leandrew Koyanagi, MD;  Location: [facility];  Service: Orthopedics;  Laterality: Right;     OB History   No obstetric history on file.     Family History  Problem Relation Age of Onset  . Hypertension Mother   . CAD Mother 56  . Diabetes Mother   . Hypertension Father   . CAD Father 54       CABG    Social History   Tobacco Use  . Smoking status: Former Smoker    Packs/day: 0.10    Types: Cigarettes  . Smokeless tobacco: Never Used  Vaping Use  . Vaping Use: Never used  Substance Use Topics  . Alcohol use: No  . Drug use: No    Home Medications Prior to Admission medications   Medication Sig Start Date End Date Taking? Authorizing Provider  empagliflozin (JARDIANCE) 10 MG TABS tablet Take 1 tablet (10 mg total) by mouth daily. Must have office visit for refills 06/24/20  Yes Minette Brine, Amy J, NP  gabapentin (NEURONTIN) 300 MG capsule Take 2 capsules (600 mg total) by mouth 2 (two) times daily. As needed; needs office visit 06/24/20  Yes Minette Brine, Amy J, NP  sertraline (ZOLOFT) 100 MG tablet Take 1 tablet (100 mg total) by mouth daily. Needs office visit 06/24/20  Yes Camillia Herter, NP  SUMAtriptan (IMITREX) 25 MG tablet 25 mg 1 tab PO at the start of the headache. May repeat in 2 hours x 1 if headache persists. Max of 2 tabs/24 hours 06/24/20  Yes Minette Brine, Amy J, NP  Blood Glucose Monitoring Suppl (TRUE METRIX AIR GLUCOSE METER) w/Device KIT 1 kit by Does not apply route 2 (two) times daily. 09/19/18   Fulp, Cammie, MD  glucose blood (TRUE METRIX BLOOD GLUCOSE TEST) test strip Use as instructed to check blood sugars twice daily and as needed 09/19/18   Fulp, Cammie, MD  hydrOXYzine  (ATARAX/VISTARIL) 25 MG tablet Take 1 tablet (25 mg total) by mouth 3 (three) times daily as needed for anxiety. Patient not taking: Reported on 07/02/2020 11/16/19   Fulp, Ander Gaster, MD  insulin aspart (NOVOLOG FLEXPEN) 100 UNIT/ML FlexPen 5 units before meals and if blood sugar at 250 or higher Patient not taking: Reported on 06/24/2020 11/16/19   Fulp, Ander Gaster, MD  Insulin Glargine (LANTUS SOLOSTAR) 100 UNIT/ML Solostar Pen Inject 35 units once per day Patient not taking: Reported on 06/24/2020 07/05/19   Fulp, Ander Gaster, MD  methocarbamol (ROBAXIN) 500 MG tablet Take 2 tablets (1,000 mg total) by mouth every 8 (eight) hours as needed for muscle spasms. Patient not taking: Reported on 07/02/2020 08/02/19   Argentina Donovan, PA-C  metoprolol tartrate (LOPRESSOR) 100 MG tablet Take 1 tablet (100 mg total) by mouth once for 1 dose. Take 1-2 hours prior to your cardiac CT scan appointment time. 10/26/19 10/26/19  Sueanne Margarita, MD  mometasone-formoterol (DULERA) 200-5 MCG/ACT AERO Inhale 2 puffs into the lungs 2 (two) times daily. Patient not taking: Reported on 07/02/2020 10/17/17   Alfonse Spruce, FNP  ondansetron (ZOFRAN) 4 MG tablet Take 1 tablet (4 mg total) by mouth every 8 (eight) hours as needed for nausea or vomiting. 06/26/20   Camillia Herter, NP  pantoprazole (PROTONIX) 40 MG tablet Take 1 tablet (40 mg total) by mouth 2 (two) times daily. To reduce stomach acid Patient not taking: Reported on 07/02/2020 10/24/19   Fulp, Ander Gaster, MD  traMADol (ULTRAM) 50 MG tablet Take 1 tablet (50 mg total) by mouth every 6 (six) hours as needed. Patient not taking: Reported on 07/02/2020 08/09/19   Aundra Dubin, PA-C  TRUEplus Lancets 28G MISC USE TWICE DAILY WHEN CHECKING BLOOD SUGARS AND AS NEEDED 11/15/19   Fulp, Cammie, MD  metFORMIN (GLUCOPHAGE) 500 MG tablet Take 1 tablet (500 mg total) by mouth 2 (two) times daily with a meal. 08/23/18 07/13/19  Fulp, Cammie, MD  traZODone (DESYREL) 50 MG tablet Take 0.5-1  tablets (25-50 mg total) by mouth at bedtime as needed for sleep. 06/04/19 07/13/19  Fulp, Cammie, MD    Allergies    Ciprofloxacin, Flecainide, and Penicillin g  Review of Systems   Review of Systems  Constitutional: Negative for chills and fever.  HENT: Negative for congestion and rhinorrhea.   Eyes: Positive for visual disturbance.  Respiratory: Negative for cough and shortness of breath.   Cardiovascular: Negative for chest pain and palpitations.  Gastrointestinal: Negative for diarrhea, nausea and vomiting.  Genitourinary: Negative for difficulty urinating and dysuria.  Musculoskeletal: Negative for arthralgias and back pain.  Skin: Negative for rash and wound.  Neurological: Positive for numbness and headaches. Negative for light-headedness.    Physical Exam Updated Vital Signs BP 111/74 (BP Location: Left Arm)   Pulse 75   Temp 98.3 F (36.8 C) (Oral)   Resp 16   Ht _0  (  1.676 m)   Wt 77.1 kg   SpO2 98%   BMI 27.44 kg/m   Physical Exam Vitals and nursing note reviewed. Exam conducted with a chaperone present.  Constitutional:      General: She is not in acute distress.    Appearance: Normal appearance.  HENT:     Head: Normocephalic and atraumatic.     Nose: No rhinorrhea.  Eyes:     General:        Right eye: No discharge.        Left eye: No discharge.     Conjunctiva/sclera: Conjunctivae normal.     Pupils: Pupils are equal, round, and reactive to light.  Cardiovascular:     Rate and Rhythm: Normal rate and regular rhythm.  Pulmonary:     Effort: Pulmonary effort is normal. No respiratory distress.     Breath sounds: No stridor.  Abdominal:     General: Abdomen is flat. There is no distension.     Palpations: Abdomen is soft.  Musculoskeletal:        General: No tenderness or signs of injury.  Skin:    General: Skin is warm and dry.  Neurological:     General: No focal deficit present.     Mental Status: She is alert. Mental status is at  baseline.     Motor: No weakness.     Comments: Equal strength in upper and lower extremities, subjective decreased sensation to light touch on the left upper extremity only.  Cranial nerves intact.  Patient able to speak clearly.  Visual fields in the left eye decreased, she says she can sense movement and light, everything else looks blurry  Psychiatric:        Mood and Affect: Mood normal.        Behavior: Behavior normal.     ED Results / Procedures / Treatments   Labs (all labs ordered are listed, but only abnormal results are displayed) Labs Reviewed  DIFFERENTIAL - Abnormal; Notable for the following components:      Result Value   Lymphs Abs 4.2 (*)    All other components within normal limits  COMPREHENSIVE METABOLIC PANEL - Abnormal; Notable for the following components:   Glucose, Bld 144 (*)    Creatinine, Ser 1.04 (*)    All other components within normal limits  I-STAT CHEM 8, ED - Abnormal; Notable for the following components:   Glucose, Bld 140 (*)    All other components within normal limits  ETHANOL  PROTIME-INR  APTT  CBC  RAPID URINE DRUG SCREEN, HOSP PERFORMED  URINALYSIS, ROUTINE W REFLEX MICROSCOPIC  I-STAT BETA HCG BLOOD, ED ([facility], [facility], [facility] ONLY)    EKG None  Radiology CT Code Stroke CTA Head W/WO contrast  Result Date: 07/02/2020 CLINICAL DATA:  44 year old female code stroke presentation with left side monocular vision loss, weakness. EXAM: CT ANGIOGRAPHY HEAD AND NECK TECHNIQUE: Multidetector CT imaging of the head and neck was performed using the standard protocol during bolus administration of intravenous contrast. Multiplanar CT image reconstructions and MIPs were obtained to evaluate the vascular anatomy. Carotid stenosis measurements (when applicable) are obtained utilizing NASCET criteria, using the distal internal carotid diameter as the denominator. CONTRAST:  10m OMNIPAQUE IOHEXOL 350 MG/ML SOLN COMPARISON:  Plain head CT today 1551 hours.  FINDINGS: CTA NECK Skeleton: Absent maxillary dentition. Prior sternotomy. Reversal of cervical lordosis with evidence of chronic C4-C5 disc and endplate degeneration. No acute osseous abnormality identified. Upper chest: Negative.  Other neck: Negative; palatine tonsils are probably surgically absent, with generalized lingual tonsil hypertrophy. Adenoids appear present and within normal limits. Aortic arch: 3 vessel arch configuration.  No arch atherosclerosis. Right carotid system: Negative. Left carotid system: Negative. Vertebral arteries: Negative aside from tortuosity.  Codominant vertebral arteries. CTA HEAD Posterior circulation: Normal distal vertebral arteries and vertebrobasilar junction. Normal left PICA and dominant appearing right AICA origins. Patent basilar artery without stenosis. Normal SCA and PCA origins. Posterior communicating arteries are diminutive or absent. Bilateral PCA branches are within normal limits. Anterior circulation: Both ICA siphons are patent. No siphon plaque or stenosis identified. Both ophthalmic artery origins are patent. Patent carotid termini, MCA and ACA origins. Dominant right ACA A1 segment. Fenestrated anterior communicating artery (normal variant). Bilateral ACA branches are within normal limits. Left MCA M1 segment, bifurcation, and left MCA branches are within normal limits. Right MCA M1 segment, bifurcation, and right MCA branches are within normal limits. Venous sinuses: Patent. The left transverse and sigmoid sinuses appear dominant along with the left IJ. Anatomic variants: Dominant right ACA A1. Fenestrated anterior communicating artery. Dominant left transverse and sigmoid venous sinuses. Review of the MIP images confirms the above findings IMPRESSION: 1. Negative for large vessel occlusion and normal arterial findings on CTA Head and Neck. 2. Lingual tonsil hypertrophy likely in the surgical absence of palatine tonsils. 3. Previous sternotomy. Electronically  Signed   By: Genevie Ann M.D.   On: 07/02/2020 16:32   CT Code Stroke CTA Neck W/WO contrast  Result Date: 07/02/2020 CLINICAL DATA:  44 year old female code stroke presentation with left side monocular vision loss, weakness. EXAM: CT ANGIOGRAPHY HEAD AND NECK TECHNIQUE: Multidetector CT imaging of the head and neck was performed using the standard protocol during bolus administration of intravenous contrast. Multiplanar CT image reconstructions and MIPs were obtained to evaluate the vascular anatomy. Carotid stenosis measurements (when applicable) are obtained utilizing NASCET criteria, using the distal internal carotid diameter as the denominator. CONTRAST:  49m OMNIPAQUE IOHEXOL 350 MG/ML SOLN COMPARISON:  Plain head CT today 1551 hours. FINDINGS: CTA NECK Skeleton: Absent maxillary dentition. Prior sternotomy. Reversal of cervical lordosis with evidence of chronic C4-C5 disc and endplate degeneration. No acute osseous abnormality identified. Upper chest: Negative. Other neck: Negative; palatine tonsils are probably surgically absent, with generalized lingual tonsil hypertrophy. Adenoids appear present and within normal limits. Aortic arch: 3 vessel arch configuration.  No arch atherosclerosis. Right carotid system: Negative. Left carotid system: Negative. Vertebral arteries: Negative aside from tortuosity.  Codominant vertebral arteries. CTA HEAD Posterior circulation: Normal distal vertebral arteries and vertebrobasilar junction. Normal left PICA and dominant appearing right AICA origins. Patent basilar artery without stenosis. Normal SCA and PCA origins. Posterior communicating arteries are diminutive or absent. Bilateral PCA branches are within normal limits. Anterior circulation: Both ICA siphons are patent. No siphon plaque or stenosis identified. Both ophthalmic artery origins are patent. Patent carotid termini, MCA and ACA origins. Dominant right ACA A1 segment. Fenestrated anterior communicating artery  (normal variant). Bilateral ACA branches are within normal limits. Left MCA M1 segment, bifurcation, and left MCA branches are within normal limits. Right MCA M1 segment, bifurcation, and right MCA branches are within normal limits. Venous sinuses: Patent. The left transverse and sigmoid sinuses appear dominant along with the left IJ. Anatomic variants: Dominant right ACA A1. Fenestrated anterior communicating artery. Dominant left transverse and sigmoid venous sinuses. Review of the MIP images confirms the above findings IMPRESSION: 1. Negative for large vessel occlusion and normal arterial  findings on CTA Head and Neck. 2. Lingual tonsil hypertrophy likely in the surgical absence of palatine tonsils. 3. Previous sternotomy. Electronically Signed   By: Genevie Ann M.D.   On: 07/02/2020 16:32   CT HEAD CODE STROKE WO CONTRAST  Result Date: 07/02/2020 CLINICAL DATA:  Code stroke.  44 year old female EXAM: CT HEAD WITHOUT CONTRAST TECHNIQUE: Contiguous axial images were obtained from the base of the skull through the vertex without intravenous contrast. COMPARISON:  Head CT 09/21/2018. FINDINGS: Brain: Cerebral volume is stable and within normal limits. No midline shift, ventriculomegaly, mass effect, evidence of mass lesion, intracranial hemorrhage or evidence of cortically based acute infarction. Gray-white matter differentiation is within normal limits throughout the brain. No encephalomalacia identified. Vascular: No suspicious intracranial vascular hyperdensity. Skull: Negative. Sinuses/Orbits: Visualized paranasal sinuses and mastoids are clear. Other: Visualized orbits and scalp soft tissues are within normal limits. ASPECTS [facility] Stroke Program Total score (0-10 with 10 being normal): 10 IMPRESSION: 1. Stable and normal noncontrast CT appearance of the brain. ASPECTS 10. 2. These results were communicated to Dr. Rory Percy at 3:59 pm on 07/02/2020 by text page via the [facility] messaging system. Electronically Signed    By: Genevie Ann M.D.   On: 07/02/2020 16:00    Procedures Procedures (including critical care time)  Medications Ordered in ED Medications  iohexol (OMNIPAQUE) 350 MG/ML injection 60 mL (60 mLs Intravenous Contrast Given 07/02/20 1621)  ketorolac (TORADOL) 15 MG/ML injection 15 mg (15 mg Intravenous Given 07/02/20 1647)  metoCLOPramide (REGLAN) injection 10 mg (10 mg Intravenous Given 07/02/20 1647)  diphenhydrAMINE (BENADRYL) injection 25 mg (25 mg Intravenous Given 07/02/20 1647)  sodium chloride 0.9 % bolus 1,000 mL (0 mLs Intravenous Stopped 07/02/20 1822)    ED Course  I have reviewed the triage vital signs and the nursing notes.  Pertinent labs & imaging results that were available during my care of the patient were reviewed by me and considered in my medical decision making (see chart for details).    MDM Rules/Calculators/A&P                          Strokelike symptoms onset an hour and a half prior to arrival.  Stroke team at bedside, airway intact, patient directly to the CT scanner.  Glucose acceptable by EMS.  Recent history of frequent headaches but otherwise no significant headaches in her history.  Noncontrasted CT shows no large central nervous system bleed neurology is at bedside and wants a stat MRI.  Bleeding thought is possibly complex migraine versus other nonstroke pathology given the patient's exam and story.  She has a history of may be complex migraines.  Further evaluation will be needed.  Patient is return to the our department, recommendation was for migraine therapy, Toradol Reglan Benadryl fluids were given.  Patient has complete resolution of symptoms shortly after medications are given.  She recalls a previous episode just like this, but never followed up with a neurologist.  What is provided for her based on the recommendation of our neurologist.  She feels comfortable discharge.  She has normal neurologic exam and she is safe for outpatient management with  return precautions provided Final Clinical Impression(s) / ED Diagnoses Final diagnoses:  Complicated migraine    Rx / DC Orders ED Discharge Orders    None       Breck Coons, MD 07/02/20 1831

## 2020-07-02 NOTE — Consult Note (Signed)
Neurology Consultation  Reason for Consult: CODE STROKE Referring Physician: Dr Dewaine Conger, MD  CC: Left eye vision loss, headache, left sided paresthesias  History is obtained from: Patient, chart  HPI: Michelle Gibson is a 44 y.o. female past medical history of diabetes, Wolff-Parkinson-White syndrome status post ablation at age 63, interstitial cystitis status post stimulator placement which is now defunct, presented to the emergency room from a place of work where she had sudden onset of monocular vision loss in the left eye.  She describes the symptoms as sudden onset of seeing only colors on the left eye and unable to appreciate finger movements or counting fingers or being able to see anything clearly out of that eye.  If she closes the left eye, her right eye visual perception and acuity is normal and clear. She has no pain on eye movement.  She did not report a curtain like darkness sensation for this vision loss. She also has a history of migraines and her headaches have worsened over the past few weeks.  She has Imitrex ordered by her primary care but has not taken it for the past few days. She has had complex migraine-like episodes in the past and has had migraine cocktails given multiple times in the past with good relief. Denies any preceding illnesses sicknesses.   Received Covid vaccine-Pfizer-April 2021 both doses.  LKW: 2:45 PM on 07/02/2020 tpa given?: no, likely complicated migraine Premorbid modified Rankin scale (mRS): 0  ROS: Obtained and negative except as noted in HPI.  Past Medical History:  Diagnosis Date  . Acute tear lateral meniscus, right, initial encounter 08/29/2019  . Asthma   . Closed fracture of upper end of fibula 09/21/2018  . Diabetes mellitus without complication ([place])    TYPE II  . Hyperglycemia 09/15/2016  . WPW (Wolff-Parkinson-White syndrome)    s/p ablation at age 36     Family History  Problem Relation Age of Onset  . Hypertension Mother    . CAD Mother 31  . Diabetes Mother   . Hypertension Father   . CAD Father 65       CABG     Social History:   reports that she has quit smoking. Her smoking use included cigarettes. She smoked 0.10 packs per day. She has never used smokeless tobacco. She reports that she does not drink alcohol and does not use drugs.  Medications  Current Facility-Administered Medications:  .  ipratropium-albuterol (DUONEB) 0.5-2.5 (3) MG/3ML nebulizer solution 3 mL, 3 mL, Nebulization, Q20 Min PRN, Hairston, Mandesia R, FNP, 3 mL at 11/14/17 1615  Current Outpatient Medications:  .  Blood Glucose Monitoring Suppl (TRUE METRIX AIR GLUCOSE METER) w/Device KIT, 1 kit by Does not apply route 2 (two) times daily., Disp: 1 kit, Rfl: 0 .  empagliflozin (JARDIANCE) 10 MG TABS tablet, Take 1 tablet (10 mg total) by mouth daily. Must have office visit for refills, Disp: 30 tablet, Rfl: 2 .  gabapentin (NEURONTIN) 300 MG capsule, Take 2 capsules (600 mg total) by mouth 2 (two) times daily. As needed; needs office visit, Disp: 120 capsule, Rfl: 2 .  glucose blood (TRUE METRIX BLOOD GLUCOSE TEST) test strip, Use as instructed to check blood sugars twice daily and as needed, Disp: 100 each, Rfl: 6 .  hydrOXYzine (ATARAX/VISTARIL) 25 MG tablet, Take 1 tablet (25 mg total) by mouth 3 (three) times daily as needed for anxiety., Disp: 30 tablet, Rfl: 3 .  insulin aspart (NOVOLOG FLEXPEN) 100 UNIT/ML FlexPen, 5  units before meals and if blood sugar at 250 or higher (Patient not taking: Reported on 06/24/2020), Disp: 15 mL, Rfl: 11 .  Insulin Glargine (LANTUS SOLOSTAR) 100 UNIT/ML Solostar Pen, Inject 35 units once per day (Patient not taking: Reported on 06/24/2020), Disp: 5 pen, Rfl: 2 .  methocarbamol (ROBAXIN) 500 MG tablet, Take 2 tablets (1,000 mg total) by mouth every 8 (eight) hours as needed for muscle spasms., Disp: 90 tablet, Rfl: 1 .  metoprolol tartrate (LOPRESSOR) 100 MG tablet, Take 1 tablet (100 mg total) by  mouth once for 1 dose. Take 1-2 hours prior to your cardiac CT scan appointment time., Disp: 1 tablet, Rfl: 0 .  mometasone-formoterol (DULERA) 200-5 MCG/ACT AERO, Inhale 2 puffs into the lungs 2 (two) times daily. (Patient taking differently: Inhale 2 puffs into the lungs as needed for wheezing or shortness of breath. ), Disp: 39 Inhaler, Rfl: 3 .  Multiple Vitamins-Calcium (ONE-A-DAY WOMENS PO), Take 1 tablet by mouth daily., Disp: , Rfl:  .  ondansetron (ZOFRAN) 4 MG tablet, Take 1 tablet (4 mg total) by mouth every 8 (eight) hours as needed for nausea or vomiting., Disp: 40 tablet, Rfl: 0 .  pantoprazole (PROTONIX) 40 MG tablet, Take 1 tablet (40 mg total) by mouth 2 (two) times daily. To reduce stomach acid, Disp: 180 tablet, Rfl: 0 .  sertraline (ZOLOFT) 100 MG tablet, Take 1 tablet (100 mg total) by mouth daily. Needs office visit, Disp: 30 tablet, Rfl: 2 .  SUMAtriptan (IMITREX) 25 MG tablet, 25 mg 1 tab PO at the start of the headache. May repeat in 2 hours x 1 if headache persists. Max of 2 tabs/24 hours, Disp: 20 tablet, Rfl: 0 .  traMADol (ULTRAM) 50 MG tablet, Take 1 tablet (50 mg total) by mouth every 6 (six) hours as needed., Disp: 30 tablet, Rfl: 0 .  TRUEplus Lancets 28G MISC, USE TWICE DAILY WHEN CHECKING BLOOD SUGARS AND AS NEEDED, Disp: 100 each, Rfl: 6  Exam: Current vital signs: SpO2 98%  Vital signs in last 24 hours:   Blood pressure systolic in the 384Y. Heart rate normal rate and rhythm. Breathing well saturating normally on room air. General: Awake alert in no distress HEENT: Normocephalic atraumatic Lungs: Clear Cardiovascular: Regular rhythm Abdomen: Soft nondistended nontender Extremities warm well perfused Neurological exam She is awake alert oriented x3 No dysarthria No aphasia Follows all commands Cranial nerves: Pupils equal round react light, extraocular movements intact, she has no light perception in the left eye.  She says she sees some colors all  through her left eye. Right eye visual acuity and field testing is normal.  Facial sensation intact.  Face symmetric.  Auditory acuity intact.  Shoulder shrug intact.  Tongue and palate midline. Motor exam: She has subtle left arm and leg weakness with no vertical drift.  Right side is 5/5. Sensory exam: Diminished sensation on the left face arm and leg with sharp cut off in the midline and diminished vibratory sense with a tuning fork on the left forehead compared to the right. Coordination: No dysmetria Gait testing deferred at this time NIH stroke scale-4   Labs I have reviewed labs in epic and the results pertinent to this consultation are:  CBC    Component Value Date/Time   WBC 7.3 07/02/2020 1546   RBC 4.42 07/02/2020 1546   HGB 13.3 07/02/2020 1552   HGB 15.2 11/16/2019 1710   HCT 39.0 07/02/2020 1552   HCT 45.0 11/16/2019 1710  PLT 359 07/02/2020 1546   PLT 463 (H) 11/16/2019 1710   MCV 89.6 07/02/2020 1546   MCV 85 11/16/2019 1710   MCH 29.2 07/02/2020 1546   MCHC 32.6 07/02/2020 1546   RDW 14.1 07/02/2020 1546   RDW 13.0 11/16/2019 1710   LYMPHSABS 4.2 (H) 07/02/2020 1546   MONOABS 0.3 07/02/2020 1546   EOSABS 0.2 07/02/2020 1546   BASOSABS 0.1 07/02/2020 1546    CMP     Component Value Date/Time   NA 142 07/02/2020 1552   NA 144 06/24/2020 1215   K 3.6 07/02/2020 1552   CL 106 07/02/2020 1552   CO2 26 06/24/2020 1215   GLUCOSE 140 (H) 07/02/2020 1552   BUN 16 07/02/2020 1552   BUN 13 06/24/2020 1215   CREATININE 0.90 07/02/2020 1552   CREATININE 1.00 10/07/2016 1506   CALCIUM 9.5 06/24/2020 1215   PROT 7.2 12/17/2019 1121   PROT 7.8 11/16/2019 1709   ALBUMIN 3.8 12/17/2019 1121   ALBUMIN 5.1 (H) 11/16/2019 1709   AST 22 12/17/2019 1121   ALT 23 12/17/2019 1121   ALKPHOS 81 12/17/2019 1121   BILITOT 0.6 12/17/2019 1121   BILITOT 0.5 11/16/2019 1709   GFRNONAA 88 06/24/2020 1215   GFRAA 101 06/24/2020 1215   Imaging I have reviewed the images  obtained: CT-scan of the brain-aspects 10.  No bleed. CTA head and neck with no obvious stenosis or occlusion.  No emergent LVO. MRI brain-could not be done due to the presence of a stimulator  Assessment: 44 year old past history of diabetes, WPW, interstitial cystitis, status post stimulator placement bladder, migraines, presenting to the emergency with sudden onset of left eye change of vision-initially described as vision loss she does not describe that as vision loss but rather seeing different colors. Has had worsening headaches-migraines in the past few weeks. In the past has had complex migraines. Taken for stat MRI but discovered that she has a stimulator that is not MRI safe hence brought back and a CTA head and neck completed which did not reveal any LVO. 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 Of note, she does have some nonorganic findings on exam like splitting the sensory change in midline and splitting vibratory sense on the forehead which makes other diagnoses such as conversion disorder are also a possibility.  Recommendations: No TPA due to inconsistent exam and presentation likely due to complex migraine. Not a candidate for EVT due to no emergent LVO. Migraine cocktail and reassess. Symptoms improved after migraine cocktail, can be discharged home with outpatient follow-up with headache neurology-I would recommend to send to Dr. Delfin Edis at [facility] neurology for discussion about preventative and abortive headache treatments.  Discussed my plan with ED provider. Please call with questions.  -- Amie Portland, MD [facility] Pager: [phone] If 7pm to 7am, please call on call as listed on AMION.

## 2020-07-02 NOTE — ED Notes (Signed)
Removed pt's IV, per [place] - RN.

## 2020-07-03 ENCOUNTER — Ambulatory Visit: Payer: Self-pay

## 2020-07-03 ENCOUNTER — Ambulatory Visit
Admission: EM | Admit: 2020-07-03 | Discharge: 2020-07-03 | Disposition: A | Discharge status: Home / Self Care | Payer: Managed Care, Other (non HMO) | Attending: Emergency Medicine | Admitting: Emergency Medicine

## 2020-07-03 DIAGNOSIS — G43919 Migraine, unspecified, intractable, without status migrainosus: Secondary | ICD-10-CM

## 2020-07-03 HISTORY — DX: Migraine, unspecified, not intractable, without status migrainosus: G43.909

## 2020-07-03 MED ORDER — DEXAMETHASONE SODIUM PHOSPHATE 10 MG/ML IJ SOLN
10.0000 mg | Freq: Once | INTRAMUSCULAR | Status: AC
  Administered 2020-07-03: 10 mg via INTRAMUSCULAR

## 2020-07-03 MED ORDER — METOCLOPRAMIDE HCL 5 MG/ML IJ SOLN
5.0000 mg | Freq: Once | INTRAMUSCULAR | Status: AC
  Administered 2020-07-03: 5 mg via INTRAMUSCULAR

## 2020-07-03 MED ORDER — ONDANSETRON 4 MG PO TBDP: 4.0000 mg | ORAL_TABLET | Freq: Three times a day (TID) | ORAL | 0 refills | Status: DC | PRN

## 2020-07-03 MED ORDER — CYCLOBENZAPRINE HCL 5 MG PO TABS: 5.0000 mg | ORAL_TABLET | Freq: Two times a day (BID) | ORAL | 0 refills | Status: DC | PRN

## 2020-07-03 MED ORDER — NAPROXEN 500 MG PO TABS: 500.0000 mg | ORAL_TABLET | Freq: Two times a day (BID) | ORAL | 0 refills | Status: DC

## 2020-07-03 MED ORDER — KETOROLAC TROMETHAMINE 30 MG/ML IJ SOLN
30.0000 mg | Freq: Once | INTRAMUSCULAR | Status: AC
  Administered 2020-07-03: 30 mg via INTRAMUSCULAR

## 2020-07-03 NOTE — ED Triage Notes (Signed)
Pt c/o migraine headache with light and noise sensitivity since noon. States had the same thing yesterday with lt sided numbness while at work and was sent to ED via EMS. States dx with migraine and given a migraine cocktail that helped some and helped her rest last night. States suppose to return to work tomorrow but unable d/t pain.

## 2020-07-03 NOTE — Telephone Encounter (Signed)
Patient called and says she was seen in the ED yesterday with migraines and got relief. She says earlier today the migraine headache came back and is not relieved with the imitrex she took 1.5 hours ago and the Tylenol she took. She says the pain is a 7 and is going down her neck into her shoulders. Other symptoms are scratchy throat, pain in her lungs when she inhales, chills. I advised no availability with PCP or any provider in the office tomorrow. Advised to go to the ED if symptoms do not improve, she verbalized understanding.  Reason for Disposition . [1] MODERATE headache (e.g., interferes with normal activities) AND [2] present > 24 hours AND [3] unexplained  (Exceptions: analgesics not tried, typical migraine, or headache part of viral illness)  Answer Assessment - Initial Assessment Questions 1. LOCATION: "Where does it hurt?"      Down back of neck into shoulders 2. ONSET: "When did the headache start?" (Minutes, hours or days)      Earlier today 3. PATTERN: "Does the pain come and go, or has it been constant since it started?"     Constant 4. SEVERITY: "How bad is the pain?" and "What does it keep you from doing?"  (e.g., Scale 1-10; mild, moderate, or severe)   - MILD (1-3): doesn't interfere with normal activities    - MODERATE (4-7): interferes with normal activities or awakens from sleep    - SEVERE (8-10): excruciating pain, unable to do any normal activities        7 5. RECURRENT SYMPTOM: "Have you ever had headaches before?" If Yes, ask: "When was the last time?" and "What happened that time?"      Yes, yesterday went to the ED 6. CAUSE: "What do you think is causing the headache?"     Migraine 7. MIGRAINE: "Have you been diagnosed with migraine headaches?" If Yes, ask: "Is this headache similar?"      Yes and it's similar to migraines 8. HEAD INJURY: "Has there been any recent injury to the head?"      No 9. OTHER SYMPTOMS: "Do you have any other symptoms?" (fever, stiff  neck, eye pain, sore throat, cold symptoms)     Scratchy throat, pain in lungs when inhale, chills 10. PREGNANCY: "Is there any chance you are pregnant?" "When was your last menstrual period?"      No  Protocols used: HEADACHE-A-AH

## 2020-07-03 NOTE — ED Provider Notes (Signed)
[name]    CSN: [id number] Arrival date & time: 07/03/20  1812      History   Chief Complaint Chief Complaint  Patient presents with  . Migraine    HPI Michelle Gibson is a 44 y.o. female  history of WPW, migraines, presenting today for evaluation of a headache.  Patient began to develop migraine yesterday that caused numbness and was seen in the emergency room with negative CT scan.  Chart reviewed by myself with negative work-up.  Provided migraine cocktail of Benadryl, Toradol and Reglan which did provide some relief.  Symptoms have flared back up today, feels similar to prior migraines.  Denies any weakness or numbness today.  Does have photophobia and nausea.  She feels she is unable to return to work due to the pain.  She plans to contact neurology to set up follow-up as this was recommended by ED.  HPI  Past Medical History:  Diagnosis Date  . Acute tear lateral meniscus, right, initial encounter 08/29/2019  . Asthma   . Closed fracture of upper end of fibula 09/21/2018  . Diabetes mellitus without complication ([place])    TYPE II  . Hyperglycemia 09/15/2016  . Migraines   . WPW (Wolff-Parkinson-White syndrome)    s/p ablation at age 35    Patient Active Problem List   Diagnosis Date Noted  . Acute tear lateral meniscus, right, initial encounter 08/29/2019  . Acute medial meniscus tear of right knee 08/22/2019  . Fever and chills 11/17/2018  . Closed fracture of upper end of fibula 09/21/2018  . WPW (Wolff-Parkinson-White syndrome) 10/10/2017  . Asthma with acute exacerbation 09/15/2016  . Hyperglycemia 09/15/2016  . History of Wolff-Parkinson-White (WPW) syndrome 09/15/2016  . Acute respiratory failure with hypoxia ([place]) 09/15/2016  . Bronchospasm 09/11/2016    Past Surgical History:  Procedure Laterality Date  . ABDOMINAL HYSTERECTOMY    . CARDIAC SURGERY    . CESAREAN SECTION    . KNEE ARTHROSCOPY WITH LATERAL MENISECTOMY Right 08/29/2019    Procedure: PARTIAL LATERAL MENISECTOMY;  Surgeon: Leandrew Koyanagi, MD;  Location: [place];  Service: Orthopedics;  Laterality: Right;  . KNEE ARTHROSCOPY WITH MEDIAL MENISECTOMY Right 08/29/2019   Procedure: RIGHT KNEE ARTHROSCOPY WITH PARTIAL MEDIAL MENISECTOMY;  Surgeon: Leandrew Koyanagi, MD;  Location: [place];  Service: Orthopedics;  Laterality: Right;    OB History   No obstetric history on file.      Home Medications    Prior to Admission medications   Medication Sig Start Date End Date Taking? Authorizing Provider  SUMAtriptan (IMITREX) 25 MG tablet 25 mg 1 tab PO at the start of the headache. May repeat in 2 hours x 1 if headache persists. Max of 2 tabs/24 hours 06/24/20  Yes Minette Brine, Amy J, NP  Blood Glucose Monitoring Suppl (TRUE METRIX AIR GLUCOSE METER) w/Device KIT 1 kit by Does not apply route 2 (two) times daily. 09/19/18   Fulp, Cammie, MD  cyclobenzaprine (FLEXERIL) 5 MG tablet Take 1-2 tablets (5-10 mg total) by mouth 2 (two) times daily as needed for muscle spasms. 07/03/20   Jarmarcus Wambold C, PA-C  empagliflozin (JARDIANCE) 10 MG TABS tablet Take 1 tablet (10 mg total) by mouth daily. Must have office visit for refills 06/24/20   Camillia Herter, NP  gabapentin (NEURONTIN) 300 MG capsule Take 2 capsules (600 mg total) by mouth 2 (two) times daily. As needed; needs office visit 06/24/20   Camillia Herter, NP  glucose blood (TRUE METRIX BLOOD GLUCOSE TEST) test strip Use as instructed to check blood sugars twice daily and as needed 09/19/18   Fulp, Cammie, MD  metoprolol tartrate (LOPRESSOR) 100 MG tablet Take 1 tablet (100 mg total) by mouth once for 1 dose. Take 1-2 hours prior to your cardiac CT scan appointment time. 10/26/19 10/26/19  Sueanne Margarita, MD  naproxen (NAPROSYN) 500 MG tablet Take 1 tablet (500 mg total) by mouth 2 (two) times daily. 07/03/20   Teoman Giraud C, PA-C  ondansetron (ZOFRAN ODT) 4 MG disintegrating tablet Take 1  tablet (4 mg total) by mouth every 8 (eight) hours as needed for nausea or vomiting. 07/03/20   Orbin Mayeux C, PA-C  ondansetron (ZOFRAN) 4 MG tablet Take 1 tablet (4 mg total) by mouth every 8 (eight) hours as needed for nausea or vomiting. 06/26/20   Camillia Herter, NP  sertraline (ZOLOFT) 100 MG tablet Take 1 tablet (100 mg total) by mouth daily. Needs office visit 06/24/20   Camillia Herter, NP  TRUEplus Lancets 28G MISC USE TWICE DAILY WHEN CHECKING BLOOD SUGARS AND AS NEEDED 11/15/19   Fulp, Cammie, MD  metFORMIN (GLUCOPHAGE) 500 MG tablet Take 1 tablet (500 mg total) by mouth 2 (two) times daily with a meal. 08/23/18 07/13/19  Fulp, Cammie, MD  traZODone (DESYREL) 50 MG tablet Take 0.5-1 tablets (25-50 mg total) by mouth at bedtime as needed for sleep. 06/04/19 07/13/19  Antony Blackbird, MD    Family History Family History  Problem Relation Age of Onset  . Hypertension Mother   . CAD Mother 69  . Diabetes Mother   . Hypertension Father   . CAD Father 19       CABG    Social History Social History   Tobacco Use  . Smoking status: Former Smoker    Packs/day: 0.10    Types: Cigarettes  . Smokeless tobacco: Never Used  Vaping Use  . Vaping Use: Never used  Substance Use Topics  . Alcohol use: No  . Drug use: No     Allergies   Ciprofloxacin, Flecainide, and Penicillin g   Review of Systems Review of Systems  Constitutional: Negative for fatigue and fever.  HENT: Negative for congestion, sinus pressure and sore throat.   Eyes: Positive for photophobia and visual disturbance. Negative for pain.  Respiratory: Negative for cough and shortness of breath.   Cardiovascular: Negative for chest pain.  Gastrointestinal: Positive for nausea and vomiting. Negative for abdominal pain.  Genitourinary: Negative for decreased urine volume and hematuria.  Musculoskeletal: Negative for myalgias, neck pain and neck stiffness.  Neurological: Positive for light-headedness and headaches.  Negative for dizziness, syncope, facial asymmetry, speech difficulty, weakness and numbness.     Physical Exam Triage Vital Signs ED Triage Vitals  Enc Vitals Group     BP 07/03/20 1830 129/89     Pulse Rate 07/03/20 1830 74     Resp 07/03/20 1830 18     Temp 07/03/20 1830 98.9 F (37.2 C)     Temp Source 07/03/20 1830 Oral     SpO2 07/03/20 1830 97 %     Weight --      Height --      Head Circumference --      Peak Flow --      Pain Score 07/03/20 1831 8     Pain Loc --      Pain Edu? --      Excl. in [place]? --  No data found.  Updated Vital Signs BP 129/89 (BP Location: Left Arm)   Pulse 74   Temp 98.9 F (37.2 C) (Oral)   Resp 18   SpO2 97%   Visual Acuity Right Eye Distance:   Left Eye Distance:   Bilateral Distance:    Right Eye Near:   Left Eye Near:    Bilateral Near:     Physical Exam Vitals and nursing note reviewed.  Constitutional:      Appearance: She is well-developed.     Comments: Sitting in dark room, appears uncomfortable, but no acute distress  HENT:     Head: Normocephalic and atraumatic.     Ears:     Comments: Bilateral ears without tenderness to palpation of external auricle, tragus and mastoid, EAC's without erythema or swelling, TM's with good bony landmarks and cone of light. Non erythematous.     Nose: Nose normal.     Mouth/Throat:     Comments: Oral mucosa pink and moist, no tonsillar enlargement or exudate. Posterior pharynx patent and nonerythematous, no uvula deviation or swelling. Normal phonation.  Eyes:     Extraocular Movements: Extraocular movements intact.     Conjunctiva/sclera: Conjunctivae normal.     Pupils: Pupils are equal, round, and reactive to light.  Cardiovascular:     Rate and Rhythm: Normal rate.  Pulmonary:     Effort: Pulmonary effort is normal. No respiratory distress.     Comments: Breathing comfortably at rest, CTABL, no wheezing, rales or other adventitious sounds auscultated  Abdominal:      General: There is no distension.  Musculoskeletal:        General: Normal range of motion.     Cervical back: Neck supple.     Comments: Diffuse tenderness throughout bilateral cervical and superior trapezius musculature  Skin:    General: Skin is warm and dry.  Neurological:     General: No focal deficit present.     Mental Status: She is alert and oriented to person, place, and time. Mental status is at baseline.     Cranial Nerves: No cranial nerve deficit.     Motor: No weakness.     Gait: Gait normal.      UC Treatments / Results  Labs (all labs ordered are listed, but only abnormal results are displayed) Labs Reviewed - No data to display  EKG   Radiology No results found.  Procedures Procedures (including critical care time)  Medications Ordered in UC Medications  ketorolac (TORADOL) 30 MG/ML injection 30 mg (30 mg Intramuscular Given 07/03/20 1940)  metoCLOPramide (REGLAN) injection 5 mg (5 mg Intramuscular Given 07/03/20 1941)  dexamethasone (DECADRON) injection 10 mg (10 mg Intramuscular Given 07/03/20 1941)    Initial Impression / Assessment and Plan / UC Course  I have reviewed the triage vital signs and the nursing notes.  Pertinent labs & imaging results that were available during my care of the patient were reviewed by me and considered in my medical decision making (see chart for details).     Complicated migraine-CT/imaging yesterday in emergency room which was unremarkable, no worsening or changes since.  Will repeat migraine cocktail with Decadron Toradol and Reglan, Naprosyn as alternative NSAID for headache use at home and follow-up with neurology.  May also try Flexeril given associated trapezius and neck tenderness.  Discussed strict return precautions. Patient verbalized understanding and is agreeable with plan.  Final Clinical Impressions(s) / UC Diagnoses   Final diagnoses:  Complicated migraine, intractable  Discharge Instructions      We gave you Toradol, Decadron and Reglan today for your headache.  May take Benadryl at home May continue with Naprosyn twice daily for headaches Zofran for nausea You may use flexeril as needed to help with pain. This is a muscle relaxer and causes sedation- please use only at bedtime or when you will be home and not have to drive/work Follow-up with neurology as planned     ED Prescriptions    Medication Sig Dispense Auth. Provider   naproxen (NAPROSYN) 500 MG tablet Take 1 tablet (500 mg total) by mouth 2 (two) times daily. 30 tablet Isabel Ardila C, PA-C   cyclobenzaprine (FLEXERIL) 5 MG tablet Take 1-2 tablets (5-10 mg total) by mouth 2 (two) times daily as needed for muscle spasms. 24 tablet Lichelle Viets C, PA-C   ondansetron (ZOFRAN ODT) 4 MG disintegrating tablet Take 1 tablet (4 mg total) by mouth every 8 (eight) hours as needed for nausea or vomiting. 20 tablet Eran Windish, Rennert C, PA-C     PDMP not reviewed this encounter.   Janith Lima, [place] 07/04/20 1752

## 2020-07-03 NOTE — ED Triage Notes (Signed)
States took Imitrex 2 hrs ago

## 2020-07-03 NOTE — Discharge Instructions (Addendum)
We gave you Toradol, Decadron and Reglan today for your headache.  May take Benadryl at home May continue with Naprosyn twice daily for headaches Zofran for nausea You may use flexeril as needed to help with pain. This is a muscle relaxer and causes sedation- please use only at bedtime or when you will be home and not have to drive/work Follow-up with neurology as planned

## 2020-07-07 ENCOUNTER — Encounter (HOSPITAL_COMMUNITY): Payer: Self-pay | Admitting: Student

## 2020-07-07 ENCOUNTER — Emergency Department (HOSPITAL_COMMUNITY)
Admission: EM | Admit: 2020-07-07 | Discharge: 2020-07-07 | Disposition: A | Discharge status: Home / Self Care | Payer: Managed Care, Other (non HMO) | Attending: Emergency Medicine | Admitting: Emergency Medicine

## 2020-07-07 ENCOUNTER — Other Ambulatory Visit (HOSPITAL_COMMUNITY): Payer: Self-pay | Admitting: Student

## 2020-07-07 ENCOUNTER — Ambulatory Visit: Payer: Self-pay | Admitting: *Deleted

## 2020-07-07 DIAGNOSIS — E1165 Type 2 diabetes mellitus with hyperglycemia: Secondary | ICD-10-CM | POA: Insufficient documentation

## 2020-07-07 DIAGNOSIS — G43109 Migraine with aura, not intractable, without status migrainosus: Secondary | ICD-10-CM | POA: Diagnosis not present

## 2020-07-07 DIAGNOSIS — R519 Headache, unspecified: Secondary | ICD-10-CM | POA: Diagnosis present

## 2020-07-07 DIAGNOSIS — J45901 Unspecified asthma with (acute) exacerbation: Secondary | ICD-10-CM | POA: Insufficient documentation

## 2020-07-07 DIAGNOSIS — Z7984 Long term (current) use of oral hypoglycemic drugs: Secondary | ICD-10-CM | POA: Insufficient documentation

## 2020-07-07 DIAGNOSIS — Z87891 Personal history of nicotine dependence: Secondary | ICD-10-CM | POA: Insufficient documentation

## 2020-07-07 MED ORDER — BUTALBITAL-APAP-CAFFEINE 50-325-40 MG PO TABS: 1.0000 | ORAL_TABLET | Freq: Three times a day (TID) | ORAL | 0 refills | Status: DC | PRN

## 2020-07-07 MED ORDER — DIPHENHYDRAMINE HCL 50 MG/ML IJ SOLN
25.0000 mg | Freq: Once | INTRAMUSCULAR | Status: AC
  Administered 2020-07-07: 25 mg via INTRAVENOUS
  Filled 2020-07-07: qty 1

## 2020-07-07 MED ORDER — BUTALBITAL-APAP-CAFFEINE 50-325-40 MG PO TABS
1.0000 | ORAL_TABLET | Freq: Once | ORAL | Status: AC
  Administered 2020-07-07: 1 via ORAL
  Filled 2020-07-07: qty 1

## 2020-07-07 MED ORDER — METOCLOPRAMIDE HCL 5 MG/ML IJ SOLN
10.0000 mg | Freq: Once | INTRAMUSCULAR | Status: AC
  Administered 2020-07-07: 10 mg via INTRAVENOUS
  Filled 2020-07-07: qty 2

## 2020-07-07 MED ORDER — METOCLOPRAMIDE HCL 10 MG PO TABS: 10.0000 mg | ORAL_TABLET | Freq: Three times a day (TID) | ORAL | 0 refills | Status: DC | PRN

## 2020-07-07 MED ORDER — KETOROLAC TROMETHAMINE 30 MG/ML IJ SOLN
15.0000 mg | Freq: Once | INTRAMUSCULAR | Status: AC
  Administered 2020-07-07: 15 mg via INTRAVENOUS
  Filled 2020-07-07: qty 1

## 2020-07-07 MED ORDER — SODIUM CHLORIDE 0.9 % IV BOLUS
1000.0000 mL | Freq: Once | INTRAVENOUS | Status: AC
  Administered 2020-07-07: 1000 mL via INTRAVENOUS

## 2020-07-07 MED FILL — BUTALB-ACETAMIN-CAFF 50-325: 50-325-40 | 1 days supply | Qty: 5 | Fill #0

## 2020-07-07 MED FILL — METOCLOPRAMIDE 10 MG TABLET: 10 | 2 days supply | Qty: 5 | Fill #0

## 2020-07-07 NOTE — Telephone Encounter (Signed)
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  Reason for Disposition  Stiff neck (can't touch chin to chest)  Answer Assessment - Initial Assessment Questions 1. LOCATION: "Where does it hurt?"      Base of head near her spine 2. ONSET: "When did the headache start?" (Minutes, hours or days)      07/03/20 3. PATTERN: "Does the pain come and go, or has it been constant since it started?"     constant 4. SEVERITY: "How bad is the pain?" and "What does it keep you from doing?"  (e.g., Scale 1-10; mild, moderate, or severe)   - MILD (1-3): doesn't interfere with normal activities    - MODERATE (4-7): interferes with normal activities or awakens from sleep    - SEVERE (8-10): excruciating pain, unable to do any normal activities        8-10 out of 10 5. RECURRENT SYMPTOM: "Have you ever had headaches before?" If Yes, ask: "When was the last time?" and "What happened that time?"      Ongoing since 07/03/20; seen in ED and urgent care 6. CAUSE: "What do you think is causing the headache?"     Patient diagnosed with hemiplegic  7. MIGRAINE: "Have you been diagnosed with migraine headaches?" If Yes, ask: "Is this headache similar?"     yes 8. HEAD INJURY: "Has there been any recent injury to the head?"      no 9. OTHER SYMPTOMS: "Do you have any other symptoms?" (fever, stiff neck, eye pain, sore throat, cold symptoms)    Stiff neck, sensitivity to light and  sound, vomiting 10. PREGNANCY: "Is there any chance you are pregnant?" "When was your last menstrual period?"  Protocols used: HEADACHE-A-AH

## 2020-07-07 NOTE — Telephone Encounter (Signed)
Per chart review pt received short-term supply of Reglan and Fioricet, pt will likely prefer a telehealth so that she does not have to keep missing work, no openings w/ any other providers this week, can I offer pt an appt with you as a double book Weds or Fri of this week?

## 2020-07-07 NOTE — Telephone Encounter (Signed)
Spoke w/ pt, she is at the ED and had just gotten in a room,reports continued migraines w/o relief from Imitrex prescribed at last visit. Today was 3rd trip to ED in 5 days for management of chronic migraine, has referral from ED for neurology but no appt until 11/30. Asking if there's something different she can try at home for migraine and nausea until neuro appt so that she does not have to continue to miss work for ED/office visits. Pls advise.

## 2020-07-07 NOTE — ED Triage Notes (Signed)
Pt back today with c/o migraines states that she took her her Imitrex without relief

## 2020-07-07 NOTE — Discharge Instructions (Addendum)
You are seen in the emergency department today for a migraine.  You were given a migraine cocktail with improvement.  We are sending you home with Reglan to take every 8 hours as needed for headache as well as fioricet to take every 8 hours as needed for headache.   We have prescribed you new medication(s) today. Discuss the medications prescribed today with your pharmacist as they can have adverse effects and interactions with your other medicines including over the counter and prescribed medications. Seek medical evaluation if you start to experience new or abnormal symptoms after taking one of these medicines, seek care immediately if you start to experience difficulty breathing, feeling of your throat closing, facial swelling, or rash as these could be indications of a more serious allergic reaction   We have placed a referral to [facility] neurology to try and schedule closer follow-up.  Please try to follow-up within 1 week.  Return to the ER for new or worsening symptoms including but not limited to sudden worsening pain, change in pain, loss of vision, numbness, weakness, passing out, inability to keep fluids down, fever, neck stiffness, or any other concerns.

## 2020-07-07 NOTE — ED Provider Notes (Signed)
[facility] EMERGENCY DEPARTMENT Provider Note   CSN: [id number] Arrival date & time: 07/07/20  1019     History Chief Complaint  Patient presents with  . Migraine    Michelle Gibson is a 44 y.o. female with a history of migraines, diabetes mellitus, or Parkinson White syndrome, and prior abdominal hysterectomy who presents to the ED with complaints of continued headache x 6 days. Patient states she developed a generalized headache that has been waxing/waning in severity since ED visit 07/02/20, was having some headaches prior to this as well. Headache is worse with bright lights, alleviated previously with migraine cocktails. Has tried imitrex at home without relief. Has had associated L facial/LUE tingling, none at present, was also having some L eye visual disturbance- not at present. Reports intermittent N/V as well. She is unable to see neurology until 09/09/20 in terms of getting an appointment. Denies fever, chills, syncope, weakness, chest pain, dyspnea, or abdominal pain. No recent head trauma. No sudden worsening of headache since last ED visit. Has had problems with similar headaches in the past.    HPI     Past Medical History:  Diagnosis Date  . Acute tear lateral meniscus, right, initial encounter 08/29/2019  . Asthma   . Closed fracture of upper end of fibula 09/21/2018  . Diabetes mellitus without complication ([place])    TYPE II  . Hyperglycemia 09/15/2016  . Migraines   . WPW (Wolff-Parkinson-White syndrome)    s/p ablation at age 52    Patient Active Problem List   Diagnosis Date Noted  . Acute tear lateral meniscus, right, initial encounter 08/29/2019  . Acute medial meniscus tear of right knee 08/22/2019  . Fever and chills 11/17/2018  . Closed fracture of upper end of fibula 09/21/2018  . WPW (Wolff-Parkinson-White syndrome) 10/10/2017  . Asthma with acute exacerbation 09/15/2016  . Hyperglycemia 09/15/2016  . History of Wolff-Parkinson-White  (WPW) syndrome 09/15/2016  . Acute respiratory failure with hypoxia ([place]) 09/15/2016  . Bronchospasm 09/11/2016    Past Surgical History:  Procedure Laterality Date  . ABDOMINAL HYSTERECTOMY    . CARDIAC SURGERY    . CESAREAN SECTION    . KNEE ARTHROSCOPY WITH LATERAL MENISECTOMY Right 08/29/2019   Procedure: PARTIAL LATERAL MENISECTOMY;  Surgeon: Leandrew Koyanagi, MD;  Location: [facility];  Service: Orthopedics;  Laterality: Right;  . KNEE ARTHROSCOPY WITH MEDIAL MENISECTOMY Right 08/29/2019   Procedure: RIGHT KNEE ARTHROSCOPY WITH PARTIAL MEDIAL MENISECTOMY;  Surgeon: Leandrew Koyanagi, MD;  Location: [facility];  Service: Orthopedics;  Laterality: Right;     OB History   No obstetric history on file.     Family History  Problem Relation Age of Onset  . Hypertension Mother   . CAD Mother 68  . Diabetes Mother   . Hypertension Father   . CAD Father 25       CABG    Social History   Tobacco Use  . Smoking status: Former Smoker    Packs/day: 0.10    Types: Cigarettes  . Smokeless tobacco: Never Used  Vaping Use  . Vaping Use: Never used  Substance Use Topics  . Alcohol use: No  . Drug use: No    Home Medications Prior to Admission medications   Medication Sig Start Date End Date Taking? Authorizing Provider  Blood Glucose Monitoring Suppl (TRUE METRIX AIR GLUCOSE METER) w/Device KIT 1 kit by Does not apply route 2 (two) times daily. 09/19/18  Fulp, Cammie, MD  cyclobenzaprine (FLEXERIL) 5 MG tablet Take 1-2 tablets (5-10 mg total) by mouth 2 (two) times daily as needed for muscle spasms. 07/03/20   Wieters, Hallie C, PA-C  empagliflozin (JARDIANCE) 10 MG TABS tablet Take 1 tablet (10 mg total) by mouth daily. Must have office visit for refills 06/24/20   Camillia Herter, NP  gabapentin (NEURONTIN) 300 MG capsule Take 2 capsules (600 mg total) by mouth 2 (two) times daily. As needed; needs office visit 06/24/20   Camillia Herter, NP  glucose  blood (TRUE METRIX BLOOD GLUCOSE TEST) test strip Use as instructed to check blood sugars twice daily and as needed 09/19/18   Fulp, Cammie, MD  metoprolol tartrate (LOPRESSOR) 100 MG tablet Take 1 tablet (100 mg total) by mouth once for 1 dose. Take 1-2 hours prior to your cardiac CT scan appointment time. 10/26/19 10/26/19  Sueanne Margarita, MD  naproxen (NAPROSYN) 500 MG tablet Take 1 tablet (500 mg total) by mouth 2 (two) times daily. 07/03/20   Wieters, Hallie C, PA-C  ondansetron (ZOFRAN ODT) 4 MG disintegrating tablet Take 1 tablet (4 mg total) by mouth every 8 (eight) hours as needed for nausea or vomiting. 07/03/20   Wieters, Hallie C, PA-C  ondansetron (ZOFRAN) 4 MG tablet Take 1 tablet (4 mg total) by mouth every 8 (eight) hours as needed for nausea or vomiting. 06/26/20   Camillia Herter, NP  sertraline (ZOLOFT) 100 MG tablet Take 1 tablet (100 mg total) by mouth daily. Needs office visit 06/24/20   Camillia Herter, NP  SUMAtriptan (IMITREX) 25 MG tablet 25 mg 1 tab PO at the start of the headache. May repeat in 2 hours x 1 if headache persists. Max of 2 tabs/24 hours 06/24/20   Camillia Herter, NP  TRUEplus Lancets 28G MISC USE TWICE DAILY WHEN CHECKING BLOOD SUGARS AND AS NEEDED 11/15/19   Fulp, Cammie, MD  metFORMIN (GLUCOPHAGE) 500 MG tablet Take 1 tablet (500 mg total) by mouth 2 (two) times daily with a meal. 08/23/18 07/13/19  Fulp, Cammie, MD  traZODone (DESYREL) 50 MG tablet Take 0.5-1 tablets (25-50 mg total) by mouth at bedtime as needed for sleep. 06/04/19 07/13/19  Fulp, Cammie, MD    Allergies    Ciprofloxacin, Flecainide, and Penicillin g  Review of Systems   Review of Systems  Constitutional: Negative for chills and fever.  Eyes: Positive for visual disturbance (not at present).  Respiratory: Negative for shortness of breath.   Cardiovascular: Negative for chest pain.  Gastrointestinal: Positive for nausea and vomiting. Negative for abdominal pain, anal bleeding, constipation and  diarrhea.  Genitourinary: Negative for dysuria.  Musculoskeletal: Negative for neck stiffness.  Neurological: Positive for numbness (intermittent not at present) and headaches. Negative for syncope, speech difficulty and weakness.  All other systems reviewed and are negative.   Physical Exam Updated Vital Signs BP 101/67 (BP Location: Right Arm)   Pulse 87   Temp 98.4 F (36.9 C) (Oral)   Resp 18   Ht 5' 6" (1.676 m)   Wt 77.1 kg   SpO2 99%   BMI 27.44 kg/m   Physical Exam Vitals and nursing note reviewed.  Constitutional:      General: She is not in acute distress.    Appearance: She is well-developed. She is not toxic-appearing.  HENT:     Head: Normocephalic and atraumatic.  Eyes:     General: Vision grossly intact. Gaze aligned appropriately.  Right eye: No discharge.        Left eye: No discharge.     Extraocular Movements: Extraocular movements intact.     Conjunctiva/sclera: Conjunctivae normal.     Comments: PERRL. No proptosis.   Cardiovascular:     Rate and Rhythm: Normal rate and regular rhythm.  Pulmonary:     Effort: Pulmonary effort is normal. No respiratory distress.     Breath sounds: Normal breath sounds. No wheezing, rhonchi or rales.  Abdominal:     General: There is no distension.     Palpations: Abdomen is soft.     Tenderness: There is no abdominal tenderness.  Musculoskeletal:     Cervical back: Normal range of motion and neck supple. No rigidity.  Skin:    General: Skin is warm and dry.     Findings: No rash.  Neurological:     Comments: Alert. Clear speech. No facial droop. CNIII-XII grossly intact. Bilateral upper and lower extremities' sensation grossly intact. 5/5 symmetric strength with grip strength and with plantar and dorsi flexion bilaterally . Normal finger to nose bilaterally. Negative pronator drift. Negative Romberg sign. Gait is steady and intact.   Psychiatric:        Behavior: Behavior normal.     ED Results /  Procedures / Treatments   Labs (all labs ordered are listed, but only abnormal results are displayed) Labs Reviewed - No data to display  EKG None  Radiology No results found.  Procedures Procedures (including critical care time)  Medications Ordered in ED Medications  sodium chloride 0.9 % bolus 1,000 mL (1,000 mLs Intravenous New Bag/Given 07/07/20 1418)  diphenhydrAMINE (BENADRYL) injection 25 mg (25 mg Intravenous Given 07/07/20 1420)  ketorolac (TORADOL) 30 MG/ML injection 15 mg (15 mg Intravenous Given 07/07/20 1420)  metoCLOPramide (REGLAN) injection 10 mg (10 mg Intravenous Given 07/07/20 1419)    ED Course  I have reviewed the triage vital signs and the nursing notes.  Pertinent labs & imaging results that were available during my care of the patient were reviewed by me and considered in my medical decision making (see chart for details).    MDM Rules/Calculators/A&P                         Patient presents to the ED with complaints of headache.  Nontoxic, vitals WNL.  Exam benign.  Additional history obtained:  Additional history obtained from chart review.  Seen by neurology during her last ED visit 07/02/2020- presented as code stroke  Per neurology note during prior visit:  No TPA due to inconsistent exam and presentation likely due to complex migraine. Not a candidate for EVT due to no emergent LVO. Migraine cocktail and reassess. Symptoms improved after migraine cocktail, can be discharged home with outpatient follow-up with headache neurology-I would recommend to send to Dr. Delfin Edis at [facility] neurology for discussion about preventative and abortive headache treatments.  Patient without acute change in her headache, no sudden worsening, no current neurodeficits. Plan for migraine cocktail & reassessment.   Patient has hx of similar headaches, no sudden change since last visit, no current neuro deficits, no dizziness, vision grossly intact, afebrile, no  nuchal rigidity- overall low suspicion for  [facility], ICH, ischemic CVA, dural venous sinus thrombosis, acute glaucoma, giant cell arteritis, mass, or meningitis.   On re-evaluation following migraine cocktail she is starting to have improvement, still has mild discomfort, will give fioricet, we discussed option of further ED  monitoring, however patient feels comfortable going home to rest at this time. Will trial reglan & fioricet at home.  I discussed treatment plan, need for neurology follow-up (ambulatory referral placed to [place] [facility] neurology- this is where Dr. Lavell Anchors practices), and return precautions with the patient. Provided opportunity for questions, patient confirmed understanding and is in agreement with plan.   Findings and plan of care discussed with supervising physician Dr. Ronnald Nian who is in agreement.   Portions of this note were generated with [employer]. Dictation errors may occur despite best attempts at proofreading.  Final Clinical Impression(s) / ED Diagnoses Final diagnoses:  Complicated migraine    Rx / DC Orders ED Discharge Orders         Ordered    metoCLOPramide (REGLAN) 10 MG tablet  Every 8 hours PRN        07/07/20 1458    butalbital-acetaminophen-caffeine (FIORICET) 50-325-40 MG tablet  Every 8 hours PRN        07/07/20 1458           Petrucelli, Glynda Jaeger, PA-C 07/07/20 1517    Lennice Sites, DO 07/07/20 1522

## 2020-07-07 NOTE — Telephone Encounter (Signed)
ATC pt for follow-up, no answer, LM to [facility], per chart review, pt currently at ED, will follow-up w/ pt later.

## 2020-07-07 NOTE — ED Notes (Signed)
Patient c/o migraine headache onset last Wed. States she has been having numbness and tingling to left arm up into the left side of her face.

## 2020-07-07 NOTE — Telephone Encounter (Signed)
Please ask patient if can schedule office visit or telehealth visit in follow-up of her migraines

## 2020-07-08 ENCOUNTER — Other Ambulatory Visit: Payer: Self-pay

## 2020-07-08 ENCOUNTER — Ambulatory Visit
Admission: EM | Admit: 2020-07-08 | Discharge: 2020-07-08 | Disposition: A | Discharge status: Home / Self Care | Payer: Managed Care, Other (non HMO) | Attending: Physician Assistant | Admitting: Physician Assistant

## 2020-07-08 ENCOUNTER — Other Ambulatory Visit: Payer: Self-pay | Admitting: Family Medicine

## 2020-07-08 DIAGNOSIS — Z1152 Encounter for screening for COVID-19: Secondary | ICD-10-CM | POA: Diagnosis not present

## 2020-07-08 DIAGNOSIS — Z20822 Contact with and (suspected) exposure to covid-19: Secondary | ICD-10-CM

## 2020-07-08 MED ORDER — FLUTICASONE PROPIONATE 50 MCG/ACT NA SUSP: 2.0000 | Freq: Every day | NASAL | 0 refills | Status: DC

## 2020-07-08 MED ORDER — KETOROLAC TROMETHAMINE 10 MG PO TABS: 10.0000 mg | ORAL_TABLET | Freq: Four times a day (QID) | ORAL | 0 refills | Status: DC | PRN

## 2020-07-08 MED ORDER — BENZONATATE 200 MG PO CAPS: 200.0000 mg | ORAL_CAPSULE | Freq: Three times a day (TID) | ORAL | 0 refills | Status: DC

## 2020-07-08 NOTE — ED Provider Notes (Signed)
[name]    CSN: [id number] Arrival date & time: 07/08/20  1949      History   Chief Complaint Chief Complaint  Patient presents with  . Cough    HPI Michelle Gibson is a 44 y.o. female.   44 year old female comes in for 2 day of URI symptoms. Rhinorrhea, cough, sore throat, loss of taste/smell. Tmax 101, responsive to antipyretic. Nausea with vomiting. Denies abdominal pain, diarrhea. Denies shortness of breath. Former smoker. COVID vaccinated.      Past Medical History:  Diagnosis Date  . Acute tear lateral meniscus, right, initial encounter 08/29/2019  . Asthma   . Closed fracture of upper end of fibula 09/21/2018  . Diabetes mellitus without complication ([place])    TYPE II  . Hyperglycemia 09/15/2016  . Migraines   . WPW (Wolff-Parkinson-White syndrome)    s/p ablation at age 65    Patient Active Problem List   Diagnosis Date Noted  . Acute tear lateral meniscus, right, initial encounter 08/29/2019  . Acute medial meniscus tear of right knee 08/22/2019  . Fever and chills 11/17/2018  . Closed fracture of upper end of fibula 09/21/2018  . WPW (Wolff-Parkinson-White syndrome) 10/10/2017  . Asthma with acute exacerbation 09/15/2016  . Hyperglycemia 09/15/2016  . History of Wolff-Parkinson-White (WPW) syndrome 09/15/2016  . Acute respiratory failure with hypoxia ([place]) 09/15/2016  . Bronchospasm 09/11/2016    Past Surgical History:  Procedure Laterality Date  . ABDOMINAL HYSTERECTOMY    . CARDIAC SURGERY    . CESAREAN SECTION    . KNEE ARTHROSCOPY WITH LATERAL MENISECTOMY Right 08/29/2019   Procedure: PARTIAL LATERAL MENISECTOMY;  Surgeon: Leandrew Koyanagi, MD;  Location: [place];  Service: Orthopedics;  Laterality: Right;  . KNEE ARTHROSCOPY WITH MEDIAL MENISECTOMY Right 08/29/2019   Procedure: RIGHT KNEE ARTHROSCOPY WITH PARTIAL MEDIAL MENISECTOMY;  Surgeon: Leandrew Koyanagi, MD;  Location: [place];  Service:  Orthopedics;  Laterality: Right;    OB History   No obstetric history on file.      Home Medications    Prior to Admission medications   Medication Sig Start Date End Date Taking? Authorizing Provider  benzonatate (TESSALON) 200 MG capsule Take 1 capsule (200 mg total) by mouth every 8 (eight) hours. 07/08/20   Tasia Catchings, Kaleyah Labreck V, PA-C  Blood Glucose Monitoring Suppl (TRUE METRIX AIR GLUCOSE METER) w/Device KIT 1 kit by Does not apply route 2 (two) times daily. 09/19/18   Fulp, Cammie, MD  butalbital-acetaminophen-caffeine (FIORICET) 50-325-40 MG tablet Take 1-2 tablets by mouth every 8 (eight) hours as needed for headache or migraine. 07/07/20 07/07/21  Petrucelli, Glynda Jaeger, PA-C  cyclobenzaprine (FLEXERIL) 5 MG tablet Take 1-2 tablets (5-10 mg total) by mouth 2 (two) times daily as needed for muscle spasms. 07/03/20   Wieters, Hallie C, PA-C  empagliflozin (JARDIANCE) 10 MG TABS tablet Take 1 tablet (10 mg total) by mouth daily. Must have office visit for refills 06/24/20   Camillia Herter, NP  fluticasone [facility]) 50 MCG/ACT nasal spray Place 2 sprays into both nostrils daily. 07/08/20   Tasia Catchings, Camy Leder V, PA-C  gabapentin (NEURONTIN) 300 MG capsule Take 2 capsules (600 mg total) by mouth 2 (two) times daily. As needed; needs office visit 06/24/20   Camillia Herter, NP  glucose blood (TRUE METRIX BLOOD GLUCOSE TEST) test strip Use as instructed to check blood sugars twice daily and as needed 09/19/18   Fulp, Cammie, MD  ketorolac (TORADOL) 10  MG tablet Take 1 tablet (10 mg total) by mouth every 6 (six) hours as needed. For headache; take after eating 07/08/20   Fulp, Cammie, MD  metoCLOPramide (REGLAN) 10 MG tablet Take 1 tablet (10 mg total) by mouth every 8 (eight) hours as needed for nausea or vomiting (headache). 07/07/20   Petrucelli, Samantha R, PA-C  naproxen (NAPROSYN) 500 MG tablet Take 1 tablet (500 mg total) by mouth 2 (two) times daily. 07/03/20   Wieters, Hallie C, PA-C  ondansetron (ZOFRAN ODT) 4 MG  disintegrating tablet Take 1 tablet (4 mg total) by mouth every 8 (eight) hours as needed for nausea or vomiting. 07/03/20   Wieters, Hallie C, PA-C  ondansetron (ZOFRAN) 4 MG tablet Take 1 tablet (4 mg total) by mouth every 8 (eight) hours as needed for nausea or vomiting. 06/26/20   Camillia Herter, NP  sertraline (ZOLOFT) 100 MG tablet Take 1 tablet (100 mg total) by mouth daily. Needs office visit 06/24/20   Camillia Herter, NP  SUMAtriptan (IMITREX) 25 MG tablet 25 mg 1 tab PO at the start of the headache. May repeat in 2 hours x 1 if headache persists. Max of 2 tabs/24 hours 06/24/20   Camillia Herter, NP  TRUEplus Lancets 28G MISC USE TWICE DAILY WHEN CHECKING BLOOD SUGARS AND AS NEEDED 11/15/19   Fulp, Cammie, MD  metFORMIN (GLUCOPHAGE) 500 MG tablet Take 1 tablet (500 mg total) by mouth 2 (two) times daily with a meal. 08/23/18 07/13/19  Fulp, Cammie, MD  traZODone (DESYREL) 50 MG tablet Take 0.5-1 tablets (25-50 mg total) by mouth at bedtime as needed for sleep. 06/04/19 07/13/19  Antony Blackbird, MD    Family History Family History  Problem Relation Age of Onset  . Hypertension Mother   . CAD Mother 64  . Diabetes Mother   . Hypertension Father   . CAD Father 59       CABG    Social History Social History   Tobacco Use  . Smoking status: Former Smoker    Packs/day: 0.10    Types: Cigarettes  . Smokeless tobacco: Never Used  Vaping Use  . Vaping Use: Never used  Substance Use Topics  . Alcohol use: No  . Drug use: No     Allergies   Ciprofloxacin, Flecainide, and Penicillin g   Review of Systems Review of Systems   Physical Exam Triage Vital Signs ED Triage Vitals  Enc Vitals Group     BP 07/08/20 2045 121/82     Pulse Rate 07/08/20 2045 75     Resp 07/08/20 2045 18     Temp 07/08/20 2045 98.3 F (36.8 C)     Temp Source 07/08/20 2045 Oral     SpO2 07/08/20 2045 98 %     Weight --      Height --      Head Circumference --      Peak Flow --      Pain Score  07/08/20 2046 5     Pain Loc --      Pain Edu? --      Excl. in [place]? --    No data found.  Updated Vital Signs BP 121/82 (BP Location: Left Arm)   Pulse 75   Temp 98.3 F (36.8 C) (Oral)   Resp 18   SpO2 98%   Physical Exam Constitutional:      General: She is not in acute distress.    Appearance: Normal appearance. She  is well-developed. She is not ill-appearing, toxic-appearing or diaphoretic.  HENT:     Head: Normocephalic and atraumatic.     Right Ear: Tympanic membrane, ear canal and external ear normal. Tympanic membrane is not erythematous or bulging.     Left Ear: Tympanic membrane, ear canal and external ear normal. Tympanic membrane is not erythematous or bulging.     Nose:     Right Sinus: No maxillary sinus tenderness or frontal sinus tenderness.     Left Sinus: No maxillary sinus tenderness or frontal sinus tenderness.     Mouth/Throat:     Mouth: Mucous membranes are moist.     Pharynx: Oropharynx is clear. Uvula midline.  Eyes:     Conjunctiva/sclera: Conjunctivae normal.     Pupils: Pupils are equal, round, and reactive to light.  Cardiovascular:     Rate and Rhythm: Normal rate and regular rhythm.  Pulmonary:     Effort: Pulmonary effort is normal. No accessory muscle usage, prolonged expiration, respiratory distress or retractions.     Breath sounds: No decreased air movement or transmitted upper airway sounds. No decreased breath sounds.     Comments: LCTAB Musculoskeletal:     Cervical back: Normal range of motion and neck supple.  Lymphadenopathy:     Cervical: Cervical adenopathy present.  Skin:    General: Skin is warm and dry.  Neurological:     Mental Status: She is alert and oriented to person, place, and time.      UC Treatments / Results  Labs (all labs ordered are listed, but only abnormal results are displayed) Labs Reviewed  NOVEL CORONAVIRUS, NAA    EKG   Radiology No results found.  Procedures Procedures (including  critical care time)  Medications Ordered in UC Medications - No data to display  Initial Impression / Assessment and Plan / UC Course  I have reviewed the triage vital signs and the nursing notes.  Pertinent labs & imaging results that were available during my care of the patient were reviewed by me and considered in my medical decision making (see chart for details).    COVID PCR test ordered. However, given patient's symptoms, suspect COVID and will have patient follow quarantine instructions for COVID. No alarming signs on exam.  LCTAB. Symptomatic treatment discussed.  Push fluids.  Return precautions given.  Patient expresses understanding and agrees to plan.  Final Clinical Impressions(s) / UC Diagnoses   Final diagnoses:  Encounter for screening for COVID-19  Suspected COVID-19 virus infection    ED Prescriptions    Medication Sig Dispense Auth. Provider   benzonatate (TESSALON) 200 MG capsule Take 1 capsule (200 mg total) by mouth every 8 (eight) hours. 21 capsule Vaanya Shambaugh V, PA-C   fluticasone (FLONASE) 50 MCG/ACT nasal spray Place 2 sprays into both nostrils daily. 1 g Ok Edwards, PA-C     PDMP not reviewed this encounter.   Ok Edwards, PA-C 07/08/20 2241

## 2020-07-08 NOTE — Telephone Encounter (Signed)
ATC pt to inform of this, no answer, left discreet message informing medication (no med name given) sent to [facility] on [address] and to [facility] to schedule, can be virtual visit if needed.

## 2020-07-08 NOTE — Telephone Encounter (Signed)
I will send in additional medication and you can schedule her for first available appointment

## 2020-07-08 NOTE — ED Triage Notes (Signed)
Pt c/o cough, sore throat, runny nose, and loss of taste/smell since yesterday.

## 2020-07-08 NOTE — Discharge Instructions (Signed)
COVID PCR testing ordered. As discussed, given your symptoms, I would like you to quarantine as if you are positive for COVID regardless of symptoms.  You can discontinue quarantine after 10 days of symptom onset AND 24 hours of no fever with improvement of symptoms.   Tessalon for  cough. You can take over the counter flonase/nasacort to help with nasal congestion/drainage. Tylenol/motrin for pain and fever. Keep hydrated, urine should be clear to pale yellow in color. If experiencing shortness of breath, trouble breathing, go to the emergency department for further evaluation needed.

## 2020-07-08 NOTE — Progress Notes (Signed)
Patient ID: Michelle Gibson, female   DOB: 1976-09-12, 44 y.o.   MRN: [id number]   Patient with complaint of continued migraine headaches. She has an appointment with Neurology but this is not until 09/09/20 and is requesting medication for treatment as she has had to go to the ED multiple times. She was recently prescribed fiornal and reglan at the ED. Will send in RX for ketorolac 10 mg that she can try between now and neurology appointment. She has been offered office/telemedicine visit.

## 2020-07-09 ENCOUNTER — Telehealth: Payer: Self-pay | Admitting: Family Medicine

## 2020-07-09 MED FILL — FLUTICASONE PROP 50 MCG SPR: 50 | 25 days supply | Qty: 16 | Fill #0

## 2020-07-09 MED FILL — BENZONATATE 100 MG CAPS: 100 | 14 days supply | Qty: 42 | Fill #0

## 2020-07-09 NOTE — Telephone Encounter (Signed)
Copied from [place] [id number]. Topic: General - Other >> Jul 09, 2020  2:13 PM Leward Quan A wrote: Reason for CRM: Patient called to inform of Dr Chapman Fitch that she is needing a note for her job stating that she is covid positive will be on quarantine until 07/18/20 also will be needing a release note for 07/19/20 to return to work. Patient asking for a call back saying that she is concerned about her lungs because it is feeling painful when she breathes. Asking for this note to be uploaded to her My Chart any question please call Ph# [phone]

## 2020-07-09 NOTE — Telephone Encounter (Signed)
Pt reports that she was tested and advised to quarantine by [facility], advised pt, per PCP, to contact them as they should provide work note

## 2020-07-10 LAB — NOVEL CORONAVIRUS, NAA: SARS-CoV-2, NAA: NOT DETECTED

## 2020-07-10 LAB — SARS-COV-2, NAA 2 DAY TAT

## 2020-07-11 ENCOUNTER — Other Ambulatory Visit: Payer: Self-pay

## 2020-07-11 ENCOUNTER — Ambulatory Visit: Payer: Managed Care, Other (non HMO) | Attending: Family Medicine | Admitting: Family Medicine

## 2020-07-11 DIAGNOSIS — Z5329 Procedure and treatment not carried out because of patient's decision for other reasons: Secondary | ICD-10-CM

## 2020-07-11 DIAGNOSIS — Z91199 Patient's noncompliance with other medical treatment and regimen due to unspecified reason: Secondary | ICD-10-CM

## 2020-07-11 MED FILL — AZITHROMYCIN 250 MG TABLET: 250 | 5 days supply | Qty: 6 | Fill #0

## 2020-07-11 NOTE — Progress Notes (Signed)
   Virtual Visit via Telephone Note  I as well as CMA attempted to connect with Michelle Gibson on 07/11/20 at  3:30 PM EDT x2 and 3:58 PM by telephone.  Call went to voicemail    Patient Location: Unknown Provider Location: [facility] office Others participating in call: Oretha Milch, CMA attempted to contact patient  Call placed to patient at 3:58 pm and went to voicemail with message that the voice mailbox was full.  CMA also contacted patient prior to 330 to to visit review and called again at 330 with no response from patient.   History of Present Illness:       44 year old patient who scheduled follow-up appointment for persistent headache/possible migraine for which she had been to the emergency department several times and patient also with recent urgent care visit for which per patient, she called reporting positive COVID-19 test.  Past Medical History:  Diagnosis Date  . Acute tear lateral meniscus, right, initial encounter 08/29/2019  . Asthma   . Closed fracture of upper end of fibula 09/21/2018  . Diabetes mellitus without complication ([place])    TYPE II  . Hyperglycemia 09/15/2016  . Migraines   . WPW (Wolff-Parkinson-White syndrome)    s/p ablation at age 3    Past Surgical History:  Procedure Laterality Date  . ABDOMINAL HYSTERECTOMY    . CARDIAC SURGERY    . CESAREAN SECTION    . KNEE ARTHROSCOPY WITH LATERAL MENISECTOMY Right 08/29/2019   Procedure: PARTIAL LATERAL MENISECTOMY;  Surgeon: Leandrew Koyanagi, MD;  Location: [place];  Service: Orthopedics;  Laterality: Right;  . KNEE ARTHROSCOPY WITH MEDIAL MENISECTOMY Right 08/29/2019   Procedure: RIGHT KNEE ARTHROSCOPY WITH PARTIAL MEDIAL MENISECTOMY;  Surgeon: Leandrew Koyanagi, MD;  Location: [place];  Service: Orthopedics;  Laterality: Right;    Family History  Problem Relation Age of Onset  . Hypertension Mother   . CAD Mother 62  . Diabetes Mother   . Hypertension Father   . CAD  Father 62       CABG    Social History   Tobacco Use  . Smoking status: Former Smoker    Packs/day: 0.10    Types: Cigarettes  . Smokeless tobacco: Never Used  Vaping Use  . Vaping Use: Never used  Substance Use Topics  . Alcohol use: No  . Drug use: No     Allergies  Allergen Reactions  . Ciprofloxacin Hives  . Flecainide Hives, Nausea And Vomiting and Rash  . Penicillin G Rash    Has patient had a PCN reaction causing immediate rash, facial/tongue/throat swelling, SOB or lightheadedness with hypotension: yes Has patient had a PCN reaction causing severe rash involving mucus membranes or skin necrosis: yes Has patient had a PCN reaction that required hospitalization: no Has patient had a PCN reaction occurring within the last 10 years: no If all of the above answers are "NO", then may proceed with Cephalosporin use.        Observations/Objective: No vital signs or physical exam conducted as visit was done via telephone  Assessment and Plan: 1. No-show for appointment Patient was not available when contacted several times regarding today's scheduled telemedicine appointment.   Follow Up Instructions: She may call to reschedule today's visit and she additionally has upcoming appointment with neurology regarding recurrent headaches/migraines  I provided 0 minutes of non-face-to-face time during this encounter.    Antony Blackbird, MD

## 2020-07-12 ENCOUNTER — Emergency Department (HOSPITAL_BASED_OUTPATIENT_CLINIC_OR_DEPARTMENT_OTHER)
Admission: EM | Admit: 2020-07-12 | Discharge: 2020-07-12 | Disposition: A | Discharge status: Home / Self Care | Payer: Managed Care, Other (non HMO) | Attending: Emergency Medicine | Admitting: Emergency Medicine

## 2020-07-12 ENCOUNTER — Other Ambulatory Visit: Payer: Self-pay

## 2020-07-12 ENCOUNTER — Encounter (HOSPITAL_BASED_OUTPATIENT_CLINIC_OR_DEPARTMENT_OTHER): Payer: Self-pay | Admitting: Emergency Medicine

## 2020-07-12 ENCOUNTER — Emergency Department (HOSPITAL_BASED_OUTPATIENT_CLINIC_OR_DEPARTMENT_OTHER): Payer: Managed Care, Other (non HMO)

## 2020-07-12 DIAGNOSIS — J45909 Unspecified asthma, uncomplicated: Secondary | ICD-10-CM | POA: Insufficient documentation

## 2020-07-12 DIAGNOSIS — R109 Unspecified abdominal pain: Secondary | ICD-10-CM | POA: Diagnosis present

## 2020-07-12 DIAGNOSIS — Z87891 Personal history of nicotine dependence: Secondary | ICD-10-CM | POA: Diagnosis not present

## 2020-07-12 DIAGNOSIS — N132 Hydronephrosis with renal and ureteral calculous obstruction: Secondary | ICD-10-CM | POA: Diagnosis not present

## 2020-07-12 DIAGNOSIS — E119 Type 2 diabetes mellitus without complications: Secondary | ICD-10-CM | POA: Insufficient documentation

## 2020-07-12 DIAGNOSIS — N200 Calculus of kidney: Secondary | ICD-10-CM

## 2020-07-12 LAB — COMPREHENSIVE METABOLIC PANEL
ALT: 61 U/L — ABNORMAL HIGH (ref 0–44)
AST: 55 U/L — ABNORMAL HIGH (ref 15–41)
Albumin: 4.2 g/dL (ref 3.5–5.0)
Alkaline Phosphatase: 109 U/L (ref 38–126)
Anion gap: 12 (ref 5–15)
BUN: 23 mg/dL — ABNORMAL HIGH (ref 6–20)
CO2: 24 mmol/L (ref 22–32)
Calcium: 9.3 mg/dL (ref 8.9–10.3)
Chloride: 100 mmol/L (ref 98–111)
Creatinine, Ser: 0.98 mg/dL (ref 0.44–1.00)
GFR calc Af Amer: >60 mL/min (ref >60)
GFR calc non Af Amer: >60 mL/min (ref >60)
Glucose, Bld: 223 mg/dL — ABNORMAL HIGH (ref 70–99)
Potassium: 3.7 mmol/L (ref 3.5–5.1)
Sodium: 136 mmol/L (ref 135–145)
Total Bilirubin: 0.3 mg/dL (ref 0.3–1.2)
Total Protein: 8 g/dL (ref 6.5–8.1)

## 2020-07-12 LAB — CBC
HCT: 42.9 % (ref 36.0–46.0)
Hemoglobin: 14.4 g/dL (ref 12.0–15.0)
MCH: 29.7 pg (ref 26.0–34.0)
MCHC: 33.6 g/dL (ref 30.0–36.0)
MCV: 88.5 fL (ref 80.0–100.0)
Platelets: 357 10*3/uL (ref 150–400)
RBC: 4.85 MIL/uL (ref 3.87–5.11)
RDW: 14 % (ref 11.5–15.5)
WBC: 7.4 10*3/uL (ref 4.0–10.5)
nRBC: 0 % (ref 0.0–0.2)

## 2020-07-12 LAB — LIPASE, BLOOD: Lipase: 39 U/L (ref 11–51)

## 2020-07-12 LAB — URINALYSIS, ROUTINE W REFLEX MICROSCOPIC
Bilirubin Urine: NEGATIVE
Glucose, UA: NEGATIVE mg/dL
Ketones, ur: NEGATIVE mg/dL
Leukocytes,Ua: NEGATIVE
Nitrite: NEGATIVE
Protein, ur: 30 mg/dL — AB
Specific Gravity, Urine: >1.03 — ABNORMAL HIGH (ref 1.005–1.030)
pH: 5.5 (ref 5.0–8.0)

## 2020-07-12 LAB — URINALYSIS, MICROSCOPIC (REFLEX): RBC / HPF: >50 RBC/hpf (ref 0–5)

## 2020-07-12 MED ORDER — HYDROMORPHONE HCL 1 MG/ML IJ SOLN
1.0000 mg | Freq: Once | INTRAMUSCULAR | Status: AC
  Administered 2020-07-12: 1 mg via INTRAVENOUS
  Filled 2020-07-12: qty 1

## 2020-07-12 MED ORDER — SODIUM CHLORIDE 0.9 % IV SOLN
INTRAVENOUS | Status: DC | PRN
  Administered 2020-07-12: 250 mL via INTRAVENOUS

## 2020-07-12 MED ORDER — TAMSULOSIN HCL 0.4 MG PO CAPS: 0.4000 mg | ORAL_CAPSULE | Freq: Every day | ORAL | 0 refills | Status: DC

## 2020-07-12 MED ORDER — FAMOTIDINE IN NACL 20-0.9 MG/50ML-% IV SOLN
20.0000 mg | Freq: Once | INTRAVENOUS | Status: AC
  Administered 2020-07-12: 20 mg via INTRAVENOUS
  Filled 2020-07-12: qty 50

## 2020-07-12 MED ORDER — KETOROLAC TROMETHAMINE 15 MG/ML IJ SOLN
15.0000 mg | Freq: Once | INTRAMUSCULAR | Status: AC
  Administered 2020-07-12: 15 mg via INTRAVENOUS
  Filled 2020-07-12: qty 1

## 2020-07-12 MED ORDER — SODIUM CHLORIDE 0.9 % IV BOLUS
1000.0000 mL | Freq: Once | INTRAVENOUS | Status: AC
  Administered 2020-07-12: 1000 mL via INTRAVENOUS

## 2020-07-12 MED ORDER — OXYCODONE HCL 5 MG PO TABS: 5.0000 mg | ORAL_TABLET | ORAL | 0 refills | Status: DC | PRN

## 2020-07-12 NOTE — ED Provider Notes (Signed)
[facility] Emergency Department Provider Note MRN:  [id number]  Arrival date & time: 07/12/20     Chief Complaint   Hematuria and Abdominal Pain   History of Present Illness   Michelle Gibson is a 44 y.o. year-old female with a history of diabetes presenting to the ED with chief complaint of flank pain.  Location: Left flank Duration: 12 hours Onset: Sudden Timing: Constant Description: Sharp Severity: Severe Exacerbating/Alleviating Factors: None Associated Symptoms: Hematuria Pertinent Negatives: Denies fever, no chest pain or shortness of breath, no lower abdominal pain, no vaginal bleeding or discharge   Review of Systems  A complete 10 system review of systems was obtained and all systems are negative except as noted in the HPI and PMH.   Patient's Health History    Past Medical History:  Diagnosis Date  . Acute tear lateral meniscus, right, initial encounter 08/29/2019  . Asthma   . Closed fracture of upper end of fibula 09/21/2018  . Diabetes mellitus without complication ([place])    TYPE II  . Hyperglycemia 09/15/2016  . Migraines   . WPW (Wolff-Parkinson-White syndrome)    s/p ablation at age 64    Past Surgical History:  Procedure Laterality Date  . ABDOMINAL HYSTERECTOMY    . CARDIAC SURGERY    . CESAREAN SECTION    . KNEE ARTHROSCOPY WITH LATERAL MENISECTOMY Right 08/29/2019   Procedure: PARTIAL LATERAL MENISECTOMY;  Surgeon: Leandrew Koyanagi, MD;  Location: [place];  Service: Orthopedics;  Laterality: Right;  . KNEE ARTHROSCOPY WITH MEDIAL MENISECTOMY Right 08/29/2019   Procedure: RIGHT KNEE ARTHROSCOPY WITH PARTIAL MEDIAL MENISECTOMY;  Surgeon: Leandrew Koyanagi, MD;  Location: [place];  Service: Orthopedics;  Laterality: Right;    Family History  Problem Relation Age of Onset  . Hypertension Mother   . CAD Mother 75  . Diabetes Mother   . Hypertension Father   . CAD Father 48       CABG     Social History   Socioeconomic History  . Marital status: Divorced    Spouse name: Not on file  . Number of children: Not on file  . Years of education: Not on file  . Highest education level: Not on file  Occupational History  . Not on file  Tobacco Use  . Smoking status: Former Smoker    Packs/day: 0.10    Types: Cigarettes  . Smokeless tobacco: Never Used  Vaping Use  . Vaping Use: Never used  Substance and Sexual Activity  . Alcohol use: No  . Drug use: No  . Sexual activity: Yes  Other Topics Concern  . Not on file  Social History Narrative  . Not on file   Social Determinants of Health   Financial Resource Strain:   . Difficulty of Paying Living Expenses: Not on file  Food Insecurity:   . Worried About Charity fundraiser in the Last Year: Not on file  . Ran Out of Food in the Last Year: Not on file  Transportation Needs:   . Lack of Transportation (Medical): Not on file  . Lack of Transportation (Non-Medical): Not on file  Physical Activity:   . Days of Exercise per Week: Not on file  . Minutes of Exercise per Session: Not on file  Stress:   . Feeling of Stress : Not on file  Social Connections:   . Frequency of Communication with Friends and Family: Not on file  .  Frequency of Social Gatherings with Friends and Family: Not on file  . Attends Religious Services: Not on file  . Active Member of Clubs or Organizations: Not on file  . Attends Archivist Meetings: Not on file  . Marital Status: Not on file  Intimate Partner Violence:   . Fear of Current or Ex-Partner: Not on file  . Emotionally Abused: Not on file  . Physically Abused: Not on file  . Sexually Abused: Not on file     Physical Exam   Vitals:   07/12/20 1023  BP: (!) 121/95  Pulse: 100  Resp: 20  Temp: 98.9 F (37.2 C)  SpO2: 97%    CONSTITUTIONAL: Well-appearing, in moderate distress due to pain NEURO:  Alert and oriented x 3, no focal deficits EYES:  eyes equal and  reactive ENT/NECK:  no LAD, no JVD CARDIO: Regular rate, well-perfused, normal S1 and S2 PULM:  CTAB no wheezing or rhonchi GI/GU:  normal bowel sounds, non-distended, non-tender MSK/SPINE:  No gross deformities, no edema SKIN:  no rash, atraumatic PSYCH:  Appropriate speech and behavior  *Additional and/or pertinent findings included in MDM below  Diagnostic and Interventional Summary    EKG Interpretation  Date/Time:    Ventricular Rate:    PR Interval:    QRS Duration:   QT Interval:    QTC Calculation:   R Axis:     Text Interpretation:        Labs Reviewed  URINALYSIS, ROUTINE W REFLEX MICROSCOPIC - Abnormal; Notable for the following components:      Result Value   APPearance CLOUDY (*)    Specific Gravity, Urine >1.030 (*)    Hgb urine dipstick LARGE (*)    Protein, ur 30 (*)    All other components within normal limits  COMPREHENSIVE METABOLIC PANEL - Abnormal; Notable for the following components:   Glucose, Bld 223 (*)    BUN 23 (*)    AST 55 (*)    ALT 61 (*)    All other components within normal limits  URINALYSIS, MICROSCOPIC (REFLEX) - Abnormal; Notable for the following components:   Bacteria, UA MANY (*)    All other components within normal limits  CBC  LIPASE, BLOOD    CT RENAL STONE STUDY  Final Result      Medications  0.9 %  sodium chloride infusion ( Intravenous Stopped 07/12/20 1244)  HYDROmorphone (DILAUDID) injection 1 mg (1 mg Intravenous Given 07/12/20 1048)  ketorolac (TORADOL) 15 MG/ML injection 15 mg (15 mg Intravenous Given 07/12/20 1047)  HYDROmorphone (DILAUDID) injection 1 mg (1 mg Intravenous Given 07/12/20 1144)  famotidine (PEPCID) IVPB 20 mg premix ( Intravenous Stopped 07/12/20 1223)  HYDROmorphone (DILAUDID) injection 1 mg (1 mg Intravenous Given 07/12/20 1247)  sodium chloride 0.9 % bolus 1,000 mL (1,000 mLs Intravenous New Bag/Given 07/12/20 1246)     Procedures  /  Critical Care Procedures  ED Course and Medical  Decision Making  I have reviewed the triage vital signs, the nursing notes, and pertinent available records from the EMR.  Listed above are laboratory and imaging tests that I personally ordered, reviewed, and interpreted and then considered in my medical decision making (see below for details).  Flank pain and hematuria, highly suggestive of kidney stone.  Confirmed on CT, proximal 3 mm stone.  Patient with no fever, no evidence of UTI, no AKI, continued significant pain in the ED but finally well managed after Toradol and Dilaudid.  Appropriate for  discharge on pain regimen and [facility] follow-up.       [name], [place] [email or web address]  Final Clinical Impressions(s) / ED Diagnoses     ICD-10-CM   1. Kidney stone  N20.0     ED Discharge Orders         Ordered    oxyCODONE (ROXICODONE) 5 MG immediate release tablet  Every 4 hours PRN        07/12/20 1326    tamsulosin (FLOMAX) 0.4 MG CAPS capsule  Daily        07/12/20 1326           Discharge Instructions Discussed with and Provided to Patient:     Discharge Instructions     You were evaluated in the Emergency Department and after careful evaluation, we did not find any emergent condition requiring admission or further testing in the hospital.  Your exam/testing today is overall reassuring.  Your symptoms seem to be due to a kidney stone.  Please take the Flomax medication daily to help you pass the stone.  We recommend Tylenol 1000 mg every 4-6 hours as well as Motrin/ibuprofen 600 mg every 4-6 hours.  For more significant pain you can use the oxycodone medication as directed.  Please return to the Emergency Department if you experience any worsening of your condition.   Thank you for allowing [place] to be a part of your care.       Maudie Flakes, MD 07/12/20 1330

## 2020-07-12 NOTE — ED Notes (Signed)
Pt reports "feels like indigestion" in epigastric area; also reports nausea; EDP notified.

## 2020-07-12 NOTE — ED Notes (Signed)
AVS reviewed with client, discussed safety while taking PO pain meds, also importance of remaining hydrated, also discussed when the need would arise for the client to return to the ED. Instructions provided on straining urine and if any stone is noted to place in specimen cup and take with her to the follow up Urology appt as recommended by the ED MD. Copy of AVS and work note provided to pt. Pt has family member here to drive her home

## 2020-07-12 NOTE — Discharge Instructions (Addendum)
You were evaluated in the Emergency Department and after careful evaluation, we did not find any emergent condition requiring admission or further testing in the hospital.  Your exam/testing today is overall reassuring.  Your symptoms seem to be due to a kidney stone.  Please take the Flomax medication daily to help you pass the stone.  We recommend Tylenol 1000 mg every 4-6 hours as well as Motrin/ibuprofen 600 mg every 4-6 hours.  For more significant pain you can use the oxycodone medication as directed.  Please return to the Emergency Department if you experience any worsening of your condition.   Thank you for allowing [place] to be a part of your care.

## 2020-07-12 NOTE — ED Notes (Signed)
IV attempted R AC without success.

## 2020-07-12 NOTE — ED Triage Notes (Signed)
Hematuria with abd pain since last night. Also endorses nausea.

## 2020-07-13 ENCOUNTER — Encounter: Payer: Self-pay | Admitting: Family Medicine

## 2020-07-14 ENCOUNTER — Ambulatory Visit: Payer: Managed Care, Other (non HMO) | Attending: Family Medicine | Admitting: Family Medicine

## 2020-07-14 ENCOUNTER — Other Ambulatory Visit: Payer: Self-pay

## 2020-07-14 VITALS — BP 110/76 | HR 100 | Ht 66.0 in | Wt 171.0 lb

## 2020-07-14 DIAGNOSIS — Z09 Encounter for follow-up examination after completed treatment for conditions other than malignant neoplasm: Secondary | ICD-10-CM

## 2020-07-14 DIAGNOSIS — N1330 Unspecified hydronephrosis: Secondary | ICD-10-CM

## 2020-07-14 DIAGNOSIS — N200 Calculus of kidney: Secondary | ICD-10-CM

## 2020-07-14 DIAGNOSIS — R300 Dysuria: Secondary | ICD-10-CM | POA: Diagnosis not present

## 2020-07-14 MED ORDER — SULFAMETHOXAZOLE-TRIMETHOPRIM 800-160 MG PO TABS: 1.0000 | ORAL_TABLET | Freq: Two times a day (BID) | ORAL | 0 refills | Status: DC

## 2020-07-14 NOTE — Progress Notes (Signed)
Established Patient Office Visit  Subjective:  Patient ID: Michelle [place], female    DOB: [birth year]  Age: 44 y.o. MRN: [id number]  CC: ED follow-up/kidney stone  HPI [place], 44 year old female with diabetes and recurrent migraines who is status post emergency department visit on 07/12/2020 after onset of left-sided back/flank pain.  She reports that prior to her ED visit in addition to pain, she also noticed blood in her urine.  She was diagnosed with a kidney stone and she reports that she is taking the Flomax.  She reports that she has not had to take any narcotic medication for pain since yesterday.  Her pain level is currently around a 3-4.  She has noted onset of burning with urination after her emergency department visit and she continues to have blood in the urine.  She is drinking water and taking the Flomax prescribed in the emergency department.  She does not believe that she has passed the kidney stone.  She reports that her blood sugars have actually been well controlled with her fasting blood sugars less than 120.  She does report multiple recent emergency department visits due to migraine headaches.  At the end of September, she reports that she had a migraine headache with onset of weakness/numbness in her left arm and blurred vision.  She reports that it was initially thought that she was having a stroke but she was told that instead this was a complicated migraine.  She reports that she is mostly bothered now by the aura which precedes her headache.  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  Past Medical History:  Diagnosis Date  . Acute tear lateral meniscus, right, initial encounter 08/29/2019  . Asthma   . Closed fracture of upper end of fibula 09/21/2018  . Diabetes mellitus  without complication ([place])    TYPE II  . Hyperglycemia 09/15/2016  . Migraines   . WPW (Wolff-Parkinson-White syndrome)    s/p ablation at age 83    Past Surgical History:  Procedure Laterality Date  . ABDOMINAL HYSTERECTOMY    . CARDIAC SURGERY    . CESAREAN SECTION    . KNEE ARTHROSCOPY WITH LATERAL MENISECTOMY Right 08/29/2019   Procedure: PARTIAL LATERAL MENISECTOMY;  Surgeon: Leandrew Koyanagi, MD;  Location: [place];  Service: Orthopedics;  Laterality: Right;  . KNEE ARTHROSCOPY WITH MEDIAL MENISECTOMY Right 08/29/2019   Procedure: RIGHT KNEE ARTHROSCOPY WITH PARTIAL MEDIAL MENISECTOMY;  Surgeon: Leandrew Koyanagi, MD;  Location: [place];  Service: Orthopedics;  Laterality: Right;    Family History  Problem Relation Age of Onset  . Hypertension Mother   . CAD Mother 60  . Diabetes Mother   . Hypertension Father   . CAD Father 45       CABG    Social History   Socioeconomic History  . Marital status: Divorced    Spouse name: Not on file  . Number of children: Not on file  . Years of education: Not on file  . Highest education level: Not on file  Occupational History  . Not on file  Tobacco Use  . Smoking status: Former Smoker    Packs/day: 0.10    Types: Cigarettes  . Smokeless tobacco: Never Used  Vaping Use  . Vaping  Use: Never used  Substance and Sexual Activity  . Alcohol use: No  . Drug use: No  . Sexual activity: Yes  Other Topics Concern  . Not on file  Social History Narrative  . Not on file   Social Determinants of Health   Financial Resource Strain:   . Difficulty of Paying Living Expenses: Not on file  Food Insecurity:   . Worried About Charity fundraiser in the Last Year: Not on file  . Ran Out of Food in the Last Year: Not on file  Transportation Needs:   . Lack of Transportation (Medical): Not on file  . Lack of Transportation (Non-Medical): Not on file  Physical Activity:   . Days of Exercise per Week:  Not on file  . Minutes of Exercise per Session: Not on file  Stress:   . Feeling of Stress : Not on file  Social Connections:   . Frequency of Communication with Friends and Family: Not on file  . Frequency of Social Gatherings with Friends and Family: Not on file  . Attends Religious Services: Not on file  . Active Member of Clubs or Organizations: Not on file  . Attends Archivist Meetings: Not on file  . Marital Status: Not on file  Intimate Partner Violence:   . Fear of Current or Ex-Partner: Not on file  . Emotionally Abused: Not on file  . Physically Abused: Not on file  . Sexually Abused: Not on file    Outpatient Medications Prior to Visit  Medication Sig Dispense Refill  . Blood Glucose Monitoring Suppl (TRUE METRIX AIR GLUCOSE METER) w/Device KIT 1 kit by Does not apply route 2 (two) times daily. 1 kit 0  . butalbital-acetaminophen-caffeine (FIORICET) 50-325-40 MG tablet Take 1-2 tablets by mouth every 8 (eight) hours as needed for headache or migraine. 5 tablet 0  . empagliflozin (JARDIANCE) 10 MG TABS tablet Take 1 tablet (10 mg total) by mouth daily. Must have office visit for refills 30 tablet 2  . fluticasone (FLONASE) 50 MCG/ACT nasal spray Place 2 sprays into both nostrils daily. 1 g 0  . gabapentin (NEURONTIN) 300 MG capsule Take 2 capsules (600 mg total) by mouth 2 (two) times daily. As needed; needs office visit 120 capsule 2  . glucose blood (TRUE METRIX BLOOD GLUCOSE TEST) test strip Use as instructed to check blood sugars twice daily and as needed 100 each 6  . oxyCODONE (ROXICODONE) 5 MG immediate release tablet Take 1 tablet (5 mg total) by mouth every 4 (four) hours as needed for severe pain. 12 tablet 0  . sertraline (ZOLOFT) 100 MG tablet Take 1 tablet (100 mg total) by mouth daily. Needs office visit 30 tablet 2  . SUMAtriptan (IMITREX) 25 MG tablet 25 mg 1 tab PO at the start of the headache. May repeat in 2 hours x 1 if headache persists. Max of 2  tabs/24 hours 20 tablet 0  . tamsulosin (FLOMAX) 0.4 MG CAPS capsule Take 1 capsule (0.4 mg total) by mouth daily. 7 capsule 0  . benzonatate (TESSALON) 200 MG capsule Take 1 capsule (200 mg total) by mouth every 8 (eight) hours. (Patient not taking: Reported on 07/14/2020) 21 capsule 0  . cyclobenzaprine (FLEXERIL) 5 MG tablet Take 1-2 tablets (5-10 mg total) by mouth 2 (two) times daily as needed for muscle spasms. (Patient not taking: Reported on 07/14/2020) 24 tablet 0  . ketorolac (TORADOL) 10 MG tablet Take 1 tablet (10 mg total) by  mouth every 6 (six) hours as needed. For headache; take after eating (Patient not taking: Reported on 07/14/2020) 20 tablet 0  . metoCLOPramide (REGLAN) 10 MG tablet Take 1 tablet (10 mg total) by mouth every 8 (eight) hours as needed for nausea or vomiting (headache). (Patient not taking: Reported on 07/14/2020) 5 tablet 0  . naproxen (NAPROSYN) 500 MG tablet Take 1 tablet (500 mg total) by mouth 2 (two) times daily. (Patient not taking: Reported on 07/14/2020) 30 tablet 0  . ondansetron (ZOFRAN ODT) 4 MG disintegrating tablet Take 1 tablet (4 mg total) by mouth every 8 (eight) hours as needed for nausea or vomiting. (Patient not taking: Reported on 07/14/2020) 20 tablet 0  . ondansetron (ZOFRAN) 4 MG tablet Take 1 tablet (4 mg total) by mouth every 8 (eight) hours as needed for nausea or vomiting. (Patient not taking: Reported on 07/14/2020) 40 tablet 0  . TRUEplus Lancets 28G MISC USE TWICE DAILY WHEN CHECKING BLOOD SUGARS AND AS NEEDED 100 each 6   Facility-Administered Medications Prior to Visit  Medication Dose Route Frequency Provider Last Rate Last Admin  . ipratropium-albuterol (DUONEB) 0.5-2.5 (3) MG/3ML nebulizer solution 3 mL  3 mL Nebulization Q20 Min PRN Fredia Beets R, FNP   3 mL at 11/14/17 1615    Allergies  Allergen Reactions  . Ciprofloxacin Hives  . Flecainide Hives, Nausea And Vomiting and Rash  . Penicillin G Rash    Has patient had a PCN  reaction causing immediate rash, facial/tongue/throat swelling, SOB or lightheadedness with hypotension: yes Has patient had a PCN reaction causing severe rash involving mucus membranes or skin necrosis: yes Has patient had a PCN reaction that required hospitalization: no Has patient had a PCN reaction occurring within the last 10 years: no If all of the above answers are "NO", then may proceed with Cephalosporin use.     ROS Review of Systems  Constitutional: Positive for fatigue. Negative for chills and fever.  HENT: Negative for sore throat and trouble swallowing.   Eyes: Negative for photophobia and visual disturbance.  Respiratory: Negative for cough and shortness of breath.   Cardiovascular: Negative for chest pain and palpitations.  Gastrointestinal: Negative for abdominal pain, constipation, diarrhea and nausea.  Endocrine: Negative for polydipsia, polyphagia and polyuria.  Genitourinary: Positive for dysuria, flank pain and hematuria. Negative for frequency.  Musculoskeletal: Positive for back pain. Negative for arthralgias.  Skin: Negative for rash and wound.  Neurological: Positive for headaches. Negative for dizziness.  Hematological: Negative for adenopathy. Does not bruise/bleed easily.  Psychiatric/Behavioral: Negative for suicidal ideas. The patient is not nervous/anxious.       Objective:    Physical Exam Vitals and nursing note reviewed.  Constitutional:      Appearance: Normal appearance.  Cardiovascular:     Rate and Rhythm: Normal rate and regular rhythm.  Pulmonary:     Effort: Pulmonary effort is normal.     Breath sounds: Normal breath sounds.  Abdominal:     Palpations: Abdomen is soft.     Tenderness: There is no abdominal tenderness. There is left CVA tenderness. There is no right CVA tenderness, guarding or rebound.  Musculoskeletal:     Right lower leg: No edema.     Left lower leg: No edema.  Skin:    General: Skin is warm and dry.    Neurological:     General: No focal deficit present.     Mental Status: She is alert and oriented to person, place, and  time.  Psychiatric:        Mood and Affect: Mood normal.        Behavior: Behavior normal.     BP 110/76 (BP Location: Left Arm, Patient Position: Sitting)   Pulse 100   Ht '5\' 6"'  (1.676 m)   Wt 171 lb (77.6 kg)   SpO2 96%   BMI 27.60 kg/m  Wt Readings from Last 3 Encounters:  07/14/20 171 lb (77.6 kg)  07/12/20 173 lb (78.5 kg)  07/07/20 170 lb (77.1 kg)     Health Maintenance Due  Topic Date Due  . Hepatitis C Screening  Never done  . HIV Screening  Never done  . PAP SMEAR-Modifier  Never done  . URINE MICROALBUMIN  06/04/2020    There are no preventive care reminders to display for this patient.  Lab Results  Component Value Date   TSH 1.240 06/05/2019   Lab Results  Component Value Date   WBC 7.4 07/12/2020   HGB 14.4 07/12/2020   HCT 42.9 07/12/2020   MCV 88.5 07/12/2020   PLT 357 07/12/2020   Lab Results  Component Value Date   NA 136 07/12/2020   K 3.7 07/12/2020   CO2 24 07/12/2020   GLUCOSE 223 (H) 07/12/2020   BUN 23 (H) 07/12/2020   CREATININE 0.98 07/12/2020   BILITOT 0.3 07/12/2020   ALKPHOS 109 07/12/2020   AST 55 (H) 07/12/2020   ALT 61 (H) 07/12/2020   PROT 8.0 07/12/2020   ALBUMIN 4.2 07/12/2020   CALCIUM 9.3 07/12/2020   ANIONGAP 12 07/12/2020   No results found for: CHOL No results found for: HDL No results found for: LDLCALC No results found for: TRIG No results found for: CHOLHDL Lab Results  Component Value Date   HGBA1C 6.2 (A) 06/24/2020      Assessment & Plan:  1. Kidney stone on left side; 2.  Hydronephrosis of left kidney; 4.  Encounter for examination following treatment at hospital She is status post evaluation at [facility] emergency department on 07/13/2019.  Notes from patient's visit were reviewed and discussed with the patient.  She does not feel that she has passed the kidney  stone and reports that she continues to have some hematuria.  She does report a decrease and left back pain/flank pain.  CT scan from the ED showed mild left hydronephrosis with prominent extrarenal pelvis.  Associated 3 mm proximal left ureteral calculus at the L3 level.  Patient will be referred to urology for further evaluation and treatment.  She is to continue the use of Flomax and remain well-hydrated.  Work note was provided for this week with patient to return this upcoming Monday.  Patient additionally reports that she is status post recurrent hospital visits due to migraine type headaches and initial migraines at the end of September caused her to have strokelike symptoms and that her employers are wary of allowing her to return to work for this reason and she does not have a follow-up with neurology until November.  Patient's work note was written for light duty with lifting restricted to no greater than 20 pounds and sedentary work if available until she is cleared by neurology. - Ambulatory referral to Urology  3. Dysuria She reports issues with dysuria which started after her emergency department visit for hematuria with diagnosis of kidney stone.  Prescription provided for Bactrim DS twice daily x3 days which should treat an uncomplicated urinary tract infection and patient has been referred  to urology for further follow-up of her kidney stone. - sulfamethoxazole-trimethoprim (BACTRIM DS) 800-160 MG tablet; Take 1 tablet by mouth 2 (two) times daily for 3 days.  Dispense: 6 tablet; Refill: 0     Follow-up: Return in about 2 months (around 09/13/2020) for DM/chronic issues in 2-3 months.   Antony Blackbird, MD

## 2020-07-14 NOTE — Patient Instructions (Signed)

## 2020-07-15 ENCOUNTER — Encounter: Payer: Self-pay | Admitting: Family Medicine

## 2020-07-17 ENCOUNTER — Ambulatory Visit
Admission: EM | Admit: 2020-07-17 | Discharge: 2020-07-17 | Disposition: A | Discharge status: Home / Self Care | Payer: Managed Care, Other (non HMO) | Attending: Emergency Medicine | Admitting: Emergency Medicine

## 2020-07-17 ENCOUNTER — Other Ambulatory Visit: Payer: Self-pay

## 2020-07-17 DIAGNOSIS — L259 Unspecified contact dermatitis, unspecified cause: Secondary | ICD-10-CM | POA: Diagnosis not present

## 2020-07-17 MED ORDER — PREDNISONE 20 MG PO TABS: 20.0000 mg | ORAL_TABLET | Freq: Every day | ORAL | 0 refills | Status: DC

## 2020-07-17 MED ORDER — CETIRIZINE HCL 10 MG PO CAPS: 10.0000 mg | ORAL_CAPSULE | Freq: Every day | ORAL | 0 refills | Status: DC

## 2020-07-17 NOTE — ED Triage Notes (Signed)
Pt stated she went into the [employer] [facility] and as she was in there she began itching. By the time she left she had a generalized rash on her face, extremities, and torso. Pt is aox4 and ambulatory. Pt is unaware of what substance triggered the rash.

## 2020-07-17 NOTE — Discharge Instructions (Signed)
Please use antihistamines-Zyrtec/Claritin in the morning, supplement with Benadryl at nighttime Prednisone 20 mg daily with food, monitor sugars while on this Monitor for rash to gradually resolve over the next week. Follow-up if developing any persistent or worsening rash, developing any difficulty breathing shortness of breath or oral swelling

## 2020-07-17 NOTE — ED Provider Notes (Signed)
[name]    CSN: [id number] Arrival date & time: 07/17/20  1347      History   Chief Complaint Chief Complaint  Patient presents with  . Rash    started 1 hour    HPI Michelle Gibson is a 44 y.o. female history of WPW, complicated migraines, DM type II, presenting today for evaluation of a rash. Recently started taking Bactrim related to UTI/kidney stone, but has completed 3-day course.  Denies prior issues with taking Bactrim.  Rash began within the past hour and has noted diffusely across her body extremities trunk and face.  Has associated itching.  Denies oral swelling or difficulty breathing.  Denies any new exposures.  Denies changing in soaps lotions detergents or hygiene products.  HPI  Past Medical History:  Diagnosis Date  . Acute tear lateral meniscus, right, initial encounter 08/29/2019  . Asthma   . Closed fracture of upper end of fibula 09/21/2018  . Diabetes mellitus without complication ([place])    TYPE II  . Hyperglycemia 09/15/2016  . Migraines   . WPW (Wolff-Parkinson-White syndrome)    s/p ablation at age 64    Patient Active Problem List   Diagnosis Date Noted  . Acute tear lateral meniscus, right, initial encounter 08/29/2019  . Acute medial meniscus tear of right knee 08/22/2019  . Fever and chills 11/17/2018  . Closed fracture of upper end of fibula 09/21/2018  . WPW (Wolff-Parkinson-White syndrome) 10/10/2017  . Asthma with acute exacerbation 09/15/2016  . Hyperglycemia 09/15/2016  . History of Wolff-Parkinson-White (WPW) syndrome 09/15/2016  . Acute respiratory failure with hypoxia ([place]) 09/15/2016  . Bronchospasm 09/11/2016    Past Surgical History:  Procedure Laterality Date  . ABDOMINAL HYSTERECTOMY    . CARDIAC SURGERY    . CESAREAN SECTION    . KNEE ARTHROSCOPY WITH LATERAL MENISECTOMY Right 08/29/2019   Procedure: PARTIAL LATERAL MENISECTOMY;  Surgeon: Leandrew Koyanagi, MD;  Location: [address];  Service:  Orthopedics;  Laterality: Right;  . KNEE ARTHROSCOPY WITH MEDIAL MENISECTOMY Right 08/29/2019   Procedure: RIGHT KNEE ARTHROSCOPY WITH PARTIAL MEDIAL MENISECTOMY;  Surgeon: Leandrew Koyanagi, MD;  Location: [place];  Service: Orthopedics;  Laterality: Right;    OB History   No obstetric history on file.      Home Medications    Prior to Admission medications   Medication Sig Start Date End Date Taking? Authorizing Provider  Blood Glucose Monitoring Suppl (TRUE METRIX AIR GLUCOSE METER) w/Device KIT 1 kit by Does not apply route 2 (two) times daily. 09/19/18  Yes Fulp, Cammie, MD  gabapentin (NEURONTIN) 300 MG capsule Take 2 capsules (600 mg total) by mouth 2 (two) times daily. As needed; needs office visit 06/24/20  Yes Minette Brine, Amy J, NP  glucose blood (TRUE METRIX BLOOD GLUCOSE TEST) test strip Use as instructed to check blood sugars twice daily and as needed 09/19/18  Yes Fulp, Cammie, MD  sertraline (ZOLOFT) 100 MG tablet Take 1 tablet (100 mg total) by mouth daily. Needs office visit 06/24/20  Yes Minette Brine, Amy J, NP  SUMAtriptan (IMITREX) 25 MG tablet 25 mg 1 tab PO at the start of the headache. May repeat in 2 hours x 1 if headache persists. Max of 2 tabs/24 hours 06/24/20  Yes Minette Brine, Amy J, NP  tamsulosin (FLOMAX) 0.4 MG CAPS capsule Take 1 capsule (0.4 mg total) by mouth daily. 07/12/20  Yes Maudie Flakes, MD  TRUEplus Lancets 28G MISC USE TWICE  DAILY WHEN CHECKING BLOOD SUGARS AND AS NEEDED 11/15/19  Yes Fulp, Cammie, MD  butalbital-acetaminophen-caffeine (FIORICET) 50-325-40 MG tablet Take 1-2 tablets by mouth every 8 (eight) hours as needed for headache or migraine. 07/07/20 07/07/21  Petrucelli, Glynda Jaeger, PA-C  Cetirizine HCl 10 MG CAPS Take 1 capsule (10 mg total) by mouth daily for 10 days. 07/17/20 07/27/20  Barett Whidbee C, PA-C  cyclobenzaprine (FLEXERIL) 5 MG tablet Take 1-2 tablets (5-10 mg total) by mouth 2 (two) times daily as needed for muscle  spasms. Patient not taking: Reported on 07/14/2020 07/03/20   Maksymilian Mabey C, PA-C  empagliflozin (JARDIANCE) 10 MG TABS tablet Take 1 tablet (10 mg total) by mouth daily. Must have office visit for refills 06/24/20   Camillia Herter, NP  fluticasone [facility]) 50 MCG/ACT nasal spray Place 2 sprays into both nostrils daily. 07/08/20   Tasia Catchings, Amy V, PA-C  predniSONE (DELTASONE) 20 MG tablet Take 1 tablet (20 mg total) by mouth daily with breakfast. 07/17/20   Elizibeth Breau C, PA-C  metFORMIN (GLUCOPHAGE) 500 MG tablet Take 1 tablet (500 mg total) by mouth 2 (two) times daily with a meal. 08/23/18 07/13/19  Fulp, Cammie, MD  metoCLOPramide (REGLAN) 10 MG tablet Take 1 tablet (10 mg total) by mouth every 8 (eight) hours as needed for nausea or vomiting (headache). Patient not taking: Reported on 07/14/2020 07/07/20 07/17/20  Petrucelli, Glynda Jaeger, PA-C  traZODone (DESYREL) 50 MG tablet Take 0.5-1 tablets (25-50 mg total) by mouth at bedtime as needed for sleep. 06/04/19 07/13/19  Antony Blackbird, MD    Family History Family History  Problem Relation Age of Onset  . Hypertension Mother   . CAD Mother 68  . Diabetes Mother   . Hypertension Father   . CAD Father 41       CABG    Social History Social History   Tobacco Use  . Smoking status: Former Smoker    Packs/day: 0.10    Types: Cigarettes  . Smokeless tobacco: Never Used  Vaping Use  . Vaping Use: Never used  Substance Use Topics  . Alcohol use: No  . Drug use: No     Allergies   Ciprofloxacin, Flecainide, and Penicillin g   Review of Systems Review of Systems  Constitutional: Negative for fatigue and fever.  HENT: Negative for mouth sores.   Eyes: Negative for visual disturbance.  Respiratory: Negative for shortness of breath.   Cardiovascular: Negative for chest pain.  Gastrointestinal: Negative for abdominal pain, diarrhea, nausea and vomiting.  Genitourinary: Negative for dysuria, flank pain, genital sores, hematuria,  menstrual problem, vaginal bleeding, vaginal discharge and vaginal pain.  Musculoskeletal: Negative for arthralgias, back pain and joint swelling.  Skin: Positive for color change and rash. Negative for wound.  Neurological: Negative for dizziness, weakness, light-headedness and headaches.     Physical Exam Triage Vital Signs ED Triage Vitals  Enc Vitals Group     BP      Pulse      Resp      Temp      Temp src      SpO2      Weight      Height      Head Circumference      Peak Flow      Pain Score      Pain Loc      Pain Edu?      Excl. in [place]?    No data found.  Updated Vital  Signs BP 110/77 (BP Location: Left Arm)   Pulse 100   Temp 98.4 F (36.9 C) (Oral)   Resp 19   SpO2 98%   Visual Acuity Right Eye Distance:   Left Eye Distance:   Bilateral Distance:    Right Eye Near:   Left Eye Near:    Bilateral Near:     Physical Exam Vitals and nursing note reviewed.  Constitutional:      Appearance: She is well-developed.     Comments: No acute distress  HENT:     Head: Normocephalic and atraumatic.     Nose: Nose normal.     Mouth/Throat:     Comments: Oral mucosa pink and moist, no tonsillar enlargement or exudate. Posterior pharynx patent and nonerythematous, no uvula deviation or swelling. Normal phonation. Eyes:     Conjunctiva/sclera: Conjunctivae normal.  Cardiovascular:     Rate and Rhythm: Normal rate.  Pulmonary:     Effort: Pulmonary effort is normal. No respiratory distress.     Comments: Breathing comfortably at rest, CTABL, no wheezing, rales or other adventitious sounds auscultated Abdominal:     General: There is no distension.  Musculoskeletal:        General: Normal range of motion.     Cervical back: Neck supple.  Skin:    General: Skin is warm and dry.     Comments: Diffuse erythematous macular, slightly raised rash noted to extremities lower extremities trunk and mildly on face, no lesions on oral mucosa  Neurological:     Mental  Status: She is alert and oriented to person, place, and time.      UC Treatments / Results  Labs (all labs ordered are listed, but only abnormal results are displayed) Labs Reviewed - No data to display  EKG   Radiology No results found.  Procedures Procedures (including critical care time)  Medications Ordered in UC Medications - No data to display  Initial Impression / Assessment and Plan / UC Course  I have reviewed the triage vital signs and the nursing notes.  Pertinent labs & imaging results that were available during my care of the patient were reviewed by me and considered in my medical decision making (see chart for details).     Rash does appear likely allergic versus contact dermatitis.  Unclear trigger at this time, but possible Bactrim correlation given recent use of this, but has tolerated in the past and has completed course.  Recommending antihistamines.  Prednisone 20 mg daily, discussed monitoring sugars as these have been mildly elevated in low 200s of recently.  Take with food.  Adhere to diabetes medicine.  Monitor for gradual resolution of rash.  No airway involvement at this time.  Discussed strict return precautions. Patient verbalized understanding and is agreeable with plan.  Final Clinical Impressions(s) / UC Diagnoses   Final diagnoses:  Contact dermatitis, unspecified contact dermatitis type, unspecified trigger     Discharge Instructions     Please use antihistamines-Zyrtec/Claritin in the morning, supplement with Benadryl at nighttime Prednisone 20 mg daily with food, monitor sugars while on this Monitor for rash to gradually resolve over the next week. Follow-up if developing any persistent or worsening rash, developing any difficulty breathing shortness of breath or oral swelling    ED Prescriptions    Medication Sig Dispense Auth. Provider   Cetirizine HCl 10 MG CAPS Take 1 capsule (10 mg total) by mouth daily for 10 days. 10  capsule Pratt Bress, Lamar C, PA-C  predniSONE (DELTASONE) 20 MG tablet Take 1 tablet (20 mg total) by mouth daily with breakfast. 7 tablet Garey Alleva C, PA-C     PDMP not reviewed this encounter.   Janith Lima, PA-C 07/17/20 1446

## 2020-07-20 ENCOUNTER — Other Ambulatory Visit: Payer: Medicaid Other

## 2020-07-22 ENCOUNTER — Ambulatory Visit: Payer: Self-pay | Admitting: *Deleted

## 2020-07-22 NOTE — Telephone Encounter (Signed)
Please See triage note.

## 2020-07-22 NOTE — Telephone Encounter (Signed)
Triage note reviewed.  Unfortunately, patient cannot be forced to follow-up in the emergency department but hopefully will be seen at the mobile clinic.

## 2020-07-22 NOTE — Telephone Encounter (Signed)
The pt called stating she is having neck swelling causnig pain in her head neck jaw and shoulders; she wotk up with this morning 07/22/20; the pt says the swelling/pain starts at the base of her neck when her neck and spine connect; she rates her pain at 8 out 10 and is constant;the pt says the pain radiates to her shoulders, head, jaws, and ears; she taken gabapetin with no relief in symptoms; she is having nausea and is sensitive to light; she also has pressure in her ears; recommendations made per nurse triage protocol but the pt does not want to be evaluated in the ED pr Urgent Care; she is seen at [facility] by Dr Antony Blackbird; spoke with Danae Chen in regards to scheduling pt; she says there is no availability in the office today but the pt can consider being evaluated at the mobile clinic today at [place]; pt notified and states she will be seen there; will rout to office for notification.  Reason for Disposition  Patient sounds very sick or weak to the triager  Answer Assessment - Initial Assessment Questions 1. ONSET: "When did the pain begin?"      07/22/20 2. LOCATION: "Where does it hurt?"     Base of neck where it meets the spine 3. PATTERN "Does the pain come and go, or has it been constant since it started?"     constant 4. SEVERITY: "How bad is the pain?"  (Scale 1-10; or mild, moderate, severe)   - NO PAIN (0): no pain or only slight stiffness    - MILD (1-3): doesn't interfere with normal activities    - MODERATE (4-7): interferes with normal activities or awakens from sleep    - SEVERE (8-10):  excruciating pain, unable to do any normal activities      8 out of 10 5. RADIATION: "Does the pain go anywhere else, shoot into your arms?"     Shoulders, head, jaw 6. CORD SYMPTOMS: "Any weakness or numbness of the arms or legs?"    7. CAUSE: "What do you think is causing the neck pain?"     Neck problems 8. NECK OVERUSE: "Any recent activities  that involved turning or twisting the neck?"     9. OTHER SYMPTOMS: "Do you have any other symptoms?" (e.g., headache, fever, chest pain, difficulty breathing, neck swelling)     Sensitivity to light, ear pressure,nausea 10. PREGNANCY: "Is there any chance you are pregnant?" "When was your last menstrual period?"  Protocols used: NECK PAIN OR STIFFNESS-A-AH

## 2020-08-11 ENCOUNTER — Other Ambulatory Visit: Payer: Self-pay

## 2020-08-11 ENCOUNTER — Ambulatory Visit: Payer: Managed Care, Other (non HMO) | Attending: Family | Admitting: Family

## 2020-08-11 DIAGNOSIS — L989 Disorder of the skin and subcutaneous tissue, unspecified: Secondary | ICD-10-CM

## 2020-08-11 NOTE — Progress Notes (Signed)
Virtual Visit via Telephone Note  I connected with Michelle Gibson, on 08/11/2020 at 3:32 PM by telephone due to the COVID-19 pandemic and verified that I am speaking with the correct person using two identifiers.  Due to current restrictions/limitations of in-office visits due to the COVID-19 pandemic, this scheduled clinical appointment was converted to a telehealth visit.   Consent: I discussed the limitations, risks, security and privacy concerns of performing an evaluation and management service by telephone and the availability of in person appointments. I also discussed with the patient that there may be a patient responsible charge related to this service. The patient expressed understanding and agreed to proceed.  Location of Patient: Home  Location of Provider: [place]  Persons participating in Telemedicine visit: [place], NP Elmon Else, CMA  History of Present Illness: Michelle Gibson is a 44 year-old female with history of Wolff-Parkinson-White syndrome, asthma with acute exacerbation, acute respiratory failure with hypoxia, bronchospasm, and hyperglycemia who presents for bump on neck.   1. BUMP ON NECK: Have had bump for: 1 year Location: base of skull/neck Progression: getting worse Reports bump has been there for at least 1 year. Has not seen any previous provider related to this concern. Describes the bump as mostly sore and painful sometimes. Denies drainage. The size of a 50 cent piece and continuing to grow. Hard and immovable appearing red and itching. Hair has fallen out around the area and shedding skin. Has not tried any over-the-counter medications. Keeping hair in a ponytail to decrease irritation. Does have a history of migraines and taking Imitrex. Cannot tell a difference in the severity of headaches stating headaches feel the same as they always have. Within the last 6 months two additional smaller bumps  have appeared on the head. Does have swollen lymph nodes on the right side of the neck causing some ear and jaw pain and can eat/drink as normal sometimes. Denies nausea, vomiting, chest pain, shortness of breath, and difficulty breathing.     Past Medical History:  Diagnosis Date  . Acute tear lateral meniscus, right, initial encounter 08/29/2019  . Asthma   . Closed fracture of upper end of fibula 09/21/2018  . Diabetes mellitus without complication ([place])    TYPE II  . Hyperglycemia 09/15/2016  . Migraines   . WPW (Wolff-Parkinson-White syndrome)    s/p ablation at age 19   Allergies  Allergen Reactions  . Ciprofloxacin Hives  . Flecainide Hives, Nausea And Vomiting and Rash  . Penicillin G Rash    Has patient had a PCN reaction causing immediate rash, facial/tongue/throat swelling, SOB or lightheadedness with hypotension: yes Has patient had a PCN reaction causing severe rash involving mucus membranes or skin necrosis: yes Has patient had a PCN reaction that required hospitalization: no Has patient had a PCN reaction occurring within the last 10 years: no If all of the above answers are "NO", then may proceed with Cephalosporin use.     Current Outpatient Medications on File Prior to Visit  Medication Sig Dispense Refill  . Blood Glucose Monitoring Suppl (TRUE METRIX AIR GLUCOSE METER) w/Device KIT 1 kit by Does not apply route 2 (two) times daily. 1 kit 0  . butalbital-acetaminophen-caffeine (FIORICET) 50-325-40 MG tablet Take 1-2 tablets by mouth every 8 (eight) hours as needed for headache or migraine. 5 tablet 0  . Cetirizine HCl 10 MG CAPS Take 1 capsule (10 mg total) by mouth daily for 10 days.  10 capsule 0  . cyclobenzaprine (FLEXERIL) 5 MG tablet Take 1-2 tablets (5-10 mg total) by mouth 2 (two) times daily as needed for muscle spasms. (Patient not taking: Reported on 07/14/2020) 24 tablet 0  . empagliflozin (JARDIANCE) 10 MG TABS tablet Take 1 tablet (10 mg total) by mouth  daily. Must have office visit for refills 30 tablet 2  . fluticasone (FLONASE) 50 MCG/ACT nasal spray Place 2 sprays into both nostrils daily. 1 g 0  . gabapentin (NEURONTIN) 300 MG capsule Take 2 capsules (600 mg total) by mouth 2 (two) times daily. As needed; needs office visit 120 capsule 2  . glucose blood (TRUE METRIX BLOOD GLUCOSE TEST) test strip Use as instructed to check blood sugars twice daily and as needed 100 each 6  . predniSONE (DELTASONE) 20 MG tablet Take 1 tablet (20 mg total) by mouth daily with breakfast. 7 tablet 0  . sertraline (ZOLOFT) 100 MG tablet Take 1 tablet (100 mg total) by mouth daily. Needs office visit 30 tablet 2  . SUMAtriptan (IMITREX) 25 MG tablet 25 mg 1 tab PO at the start of the headache. May repeat in 2 hours x 1 if headache persists. Max of 2 tabs/24 hours 20 tablet 0  . tamsulosin (FLOMAX) 0.4 MG CAPS capsule Take 1 capsule (0.4 mg total) by mouth daily. 7 capsule 0  . TRUEplus Lancets 28G MISC USE TWICE DAILY WHEN CHECKING BLOOD SUGARS AND AS NEEDED 100 each 6  . [DISCONTINUED] metFORMIN (GLUCOPHAGE) 500 MG tablet Take 1 tablet (500 mg total) by mouth 2 (two) times daily with a meal. 60 tablet 6  . [DISCONTINUED] metoCLOPramide (REGLAN) 10 MG tablet Take 1 tablet (10 mg total) by mouth every 8 (eight) hours as needed for nausea or vomiting (headache). (Patient not taking: Reported on 07/14/2020) 5 tablet 0  . [DISCONTINUED] traZODone (DESYREL) 50 MG tablet Take 0.5-1 tablets (25-50 mg total) by mouth at bedtime as needed for sleep. 30 tablet 3   Current Facility-Administered Medications on File Prior to Visit  Medication Dose Route Frequency Provider Last Rate Last Admin  . ipratropium-albuterol (DUONEB) 0.5-2.5 (3) MG/3ML nebulizer solution 3 mL  3 mL Nebulization Q20 Min PRN Fredia Beets R, FNP   3 mL at 11/14/17 1615    Observations/Objective: Alert and oriented x 3. Not in acute distress. Physical examination not completed as this is a  telemedicine visit.  Assessment and Plan: 1. Bumps on skin: Have had bump for: 1 year Location: base of skull/neck Progression: getting worse Reports bump has been there for at least 1 year. Has not seen any previous provider related to this concern. Describes the bump as mostly sore and painful sometimes. Denies drainage.The size of a 50 cent piece and continuing to grow. Hard and immovable appearing red and itching. Hair has fallen out around the area and shedding skin. Has not tried any over-the-counter medications. Keeping hair in a ponytail to decrease irritation. Does have a history of migraines and taking Imitrex. Cannot tell a difference in the severity of headaches stating headaches feel the same as they always have. Within the last 6 months two additional smaller bumps have appeared on the head. Does have swollen lymph nodes on the right side of the neck causing some ear and jaw pain and can eat/drink as normal sometimes. Denies nausea, vomiting, chest pain, shortness of breath, and difficulty breathing.  - Referral to Dermatology for further evaluation and assessment. - Follow-up with primary physician as needed. - Ambulatory  referral to Dermatology    Follow Up Instructions: Referral to Dermatology. Follow-up with primary physician as needed.   Patient was given clear instructions to go to Emergency Department or return to medical center if symptoms don't improve, worsen, or new problems develop.The patient verbalized understanding.  I discussed the assessment and treatment plan with the patient. The patient was provided an opportunity to ask questions and all were answered. The patient agreed with the plan and demonstrated an understanding of the instructions.   The patient was advised to call back or seek an in-person evaluation if the symptoms worsen or if the condition fails to improve as anticipated.   I provided 15 minutes total of non-face-to-face time during this encounter  including median intraservice time, reviewing previous notes, labs, imaging, medications, management and patient verbalized understanding.    Camillia Herter, NP  [facility] [place], [place]   08/11/2020, 8:39 AM

## 2020-08-14 MED FILL — SERTRALINE HCL 100 MG TAB: 100 | 30 days supply | Qty: 30 | Fill #1

## 2020-08-14 MED FILL — GABAPENTIN 300 MG CAPSULE: 300 | 30 days supply | Qty: 120 | Fill #1

## 2020-09-03 ENCOUNTER — Telehealth: Payer: Self-pay

## 2020-09-03 NOTE — Telephone Encounter (Signed)
[facility] FAX THAT PT DECLINED DERMATOLOGY REFERRAL 08/27/20

## 2020-09-08 NOTE — Progress Notes (Deleted)
[facility]    Provider:  Dr Jaynee Eagles Requesting Provider: Antony Blackbird, MD Primary Care Provider:  No primary care provider on file.  CC:  ***  HPI:  Michelle Gibson is a 44 y.o. female here as requested by Antony Blackbird, MD for hospital follow-up for migraines.  Past medical history Wolff-Parkinson-White syndrome, asthma, acute respiratory failure with hypoxia, diabetes type 2, migraines.  Patient appears to frequent the ED, I reviewed several of her last visits from October 7, October 2, September 28 which were not due to migraines but she was seen on September 27 for migraines, patient presented that day in the emergency room with headache for 6 days, generalized headache waxing waning since September 22, photophobia, alleviated previously with migraine cocktails, tried Imitrex at home without relief, had associated left facial and left upper extremity tingling none at present when she was seen, also reported some left eye visual disturbance which was not present at the time, intermittent nausea vomiting.  She had similar problems with headaches in the past.  Neurologic exam was normal, she was seen by neurology at her prior emergency room visit on 22 September when she presented as a code stroke and diagnosed with likely complex migraine.  Symptoms improved after migraine cocktail both times.  On the 22nd CT of the head was stable and normal, CTA was negative for large vessel occlusion in the head and neck (personally reviewed images and agree).  Reviewed notes, labs and imaging from outside physicians, which showed ***  Review of Systems: Patient complains of symptoms per HPI as well as the following symptoms ***. Pertinent negatives and positives per HPI. All others negative.   Social History   Socioeconomic History  . Marital status: Divorced    Spouse name: Not on file  . Number of children: Not on file  . Years of education: Not on file  . Highest education level:  Not on file  Occupational History  . Not on file  Tobacco Use  . Smoking status: Former Smoker    Packs/day: 0.10    Types: Cigarettes  . Smokeless tobacco: Never Used  Vaping Use  . Vaping Use: Never used  Substance and Sexual Activity  . Alcohol use: No  . Drug use: No  . Sexual activity: Yes  Other Topics Concern  . Not on file  Social History Narrative  . Not on file   Social Determinants of Health   Financial Resource Strain:   . Difficulty of Paying Living Expenses: Not on file  Food Insecurity:   . Worried About Charity fundraiser in the Last Year: Not on file  . Ran Out of Food in the Last Year: Not on file  Transportation Needs:   . Lack of Transportation (Medical): Not on file  . Lack of Transportation (Non-Medical): Not on file  Physical Activity:   . Days of Exercise per Week: Not on file  . Minutes of Exercise per Session: Not on file  Stress:   . Feeling of Stress : Not on file  Social Connections:   . Frequency of Communication with Friends and Family: Not on file  . Frequency of Social Gatherings with Friends and Family: Not on file  . Attends Religious Services: Not on file  . Active Member of Clubs or Organizations: Not on file  . Attends Archivist Meetings: Not on file  . Marital Status: Not on file  Intimate Partner Violence:   . Fear of Current or  Ex-Partner: Not on file  . Emotionally Abused: Not on file  . Physically Abused: Not on file  . Sexually Abused: Not on file    Family History  Problem Relation Age of Onset  . Hypertension Mother   . CAD Mother 59  . Diabetes Mother   . Hypertension Father   . CAD Father 93       CABG    Past Medical History:  Diagnosis Date  . Acute tear lateral meniscus, right, initial encounter 08/29/2019  . Asthma   . Closed fracture of upper end of fibula 09/21/2018  . Diabetes mellitus without complication ([place])    TYPE II  . Hyperglycemia 09/15/2016  . Migraines   . WPW  (Wolff-Parkinson-White syndrome)    s/p ablation at age 44    Patient Active Problem List   Diagnosis Date Noted  . Acute tear lateral meniscus, right, initial encounter 08/29/2019  . Acute medial meniscus tear of right knee 08/22/2019  . Fever and chills 11/17/2018  . Closed fracture of upper end of fibula 09/21/2018  . WPW (Wolff-Parkinson-White syndrome) 10/10/2017  . Asthma with acute exacerbation 09/15/2016  . Hyperglycemia 09/15/2016  . History of Wolff-Parkinson-White (WPW) syndrome 09/15/2016  . Acute respiratory failure with hypoxia ([place]) 09/15/2016  . Bronchospasm 09/11/2016    Past Surgical History:  Procedure Laterality Date  . ABDOMINAL HYSTERECTOMY    . CARDIAC SURGERY    . CESAREAN SECTION    . KNEE ARTHROSCOPY WITH LATERAL MENISECTOMY Right 08/29/2019   Procedure: PARTIAL LATERAL MENISECTOMY;  Surgeon: Leandrew Koyanagi, MD;  Location: [facility];  Service: Orthopedics;  Laterality: Right;  . KNEE ARTHROSCOPY WITH MEDIAL MENISECTOMY Right 08/29/2019   Procedure: RIGHT KNEE ARTHROSCOPY WITH PARTIAL MEDIAL MENISECTOMY;  Surgeon: Leandrew Koyanagi, MD;  Location: [facility];  Service: Orthopedics;  Laterality: Right;    Current Outpatient Medications  Medication Sig Dispense Refill  . Blood Glucose Monitoring Suppl (TRUE METRIX AIR GLUCOSE METER) w/Device KIT 1 kit by Does not apply route 2 (two) times daily. 1 kit 0  . butalbital-acetaminophen-caffeine (FIORICET) 50-325-40 MG tablet Take 1-2 tablets by mouth every 8 (eight) hours as needed for headache or migraine. 5 tablet 0  . Cetirizine HCl 10 MG CAPS Take 1 capsule (10 mg total) by mouth daily for 10 days. 10 capsule 0  . cyclobenzaprine (FLEXERIL) 5 MG tablet Take 1-2 tablets (5-10 mg total) by mouth 2 (two) times daily as needed for muscle spasms. (Patient not taking: Reported on 07/14/2020) 24 tablet 0  . empagliflozin (JARDIANCE) 10 MG TABS tablet Take 1 tablet (10 mg total) by mouth daily.  Must have office visit for refills 30 tablet 2  . fluticasone (FLONASE) 50 MCG/ACT nasal spray Place 2 sprays into both nostrils daily. 1 g 0  . gabapentin (NEURONTIN) 300 MG capsule Take 2 capsules (600 mg total) by mouth 2 (two) times daily. As needed; needs office visit 120 capsule 2  . glucose blood (TRUE METRIX BLOOD GLUCOSE TEST) test strip Use as instructed to check blood sugars twice daily and as needed 100 each 6  . predniSONE (DELTASONE) 20 MG tablet Take 1 tablet (20 mg total) by mouth daily with breakfast. 7 tablet 0  . sertraline (ZOLOFT) 100 MG tablet Take 1 tablet (100 mg total) by mouth daily. Needs office visit 30 tablet 2  . SUMAtriptan (IMITREX) 25 MG tablet 25 mg 1 tab PO at the start of the headache. May repeat in 2  hours x 1 if headache persists. Max of 2 tabs/24 hours 20 tablet 0  . tamsulosin (FLOMAX) 0.4 MG CAPS capsule Take 1 capsule (0.4 mg total) by mouth daily. 7 capsule 0  . TRUEplus Lancets 28G MISC USE TWICE DAILY WHEN CHECKING BLOOD SUGARS AND AS NEEDED 100 each 6   Current Facility-Administered Medications  Medication Dose Route Frequency Provider Last Rate Last Admin  . ipratropium-albuterol (DUONEB) 0.5-2.5 (3) MG/3ML nebulizer solution 3 mL  3 mL Nebulization Q20 Min PRN Alfonse Spruce, FNP   3 mL at 11/14/17 1615    Allergies as of 09/09/2020 - Review Complete 07/17/2020  Allergen Reaction Noted  . Ciprofloxacin Hives 02/03/2016  . Flecainide Hives, Nausea And Vomiting, and Rash 10/09/2017  . Penicillin g Rash 10/10/2017    Vitals: There were no vitals taken for this visit. Last Weight:  Wt Readings from Last 1 Encounters:  07/14/20 171 lb (77.6 kg)   Last Height:   Ht Readings from Last 1 Encounters:  07/14/20 '5\' 6"'  (1.676 m)     Physical exam: Exam: Gen: NAD, conversant, well nourised, obese, well groomed                     CV: RRR, no MRG. No Carotid Bruits. No peripheral edema, warm, nontender Eyes: Conjunctivae clear without  exudates or hemorrhage  Neuro: Detailed Neurologic Exam  Speech:    Speech is normal; fluent and spontaneous with normal comprehension.  Cognition:    The patient is oriented to person, place, and time;     recent and remote memory intact;     language fluent;     normal attention, concentration,     fund of knowledge Cranial Nerves:    The pupils are equal, round, and reactive to light. The fundi are normal and spontaneous venous pulsations are present. Visual fields are full to finger confrontation. Extraocular movements are intact. Trigeminal sensation is intact and the muscles of mastication are normal. The face is symmetric. The palate elevates in the midline. Hearing intact. Voice is normal. Shoulder shrug is normal. The tongue has normal motion without fasciculations.   Coordination:    Normal finger to nose and heel to shin. Normal rapid alternating movements.   Gait:    Heel-toe and tandem gait are normal.   Motor Observation:    No asymmetry, no atrophy, and no involuntary movements noted. Tone:    Normal muscle tone.    Posture:    Posture is normal. normal erect    Strength:    Strength is V/V in the upper and lower limbs.      Sensation: intact to LT     Reflex Exam:  DTR's:    Deep tendon reflexes in the upper and lower extremities are normal bilaterally.   Toes:    The toes are downgoing bilaterally.   Clonus:    Clonus is absent.    Assessment/Plan:    No orders of the defined types were placed in this encounter.  No orders of the defined types were placed in this encounter.   Cc: Antony Blackbird, MD,  No primary care provider on file.  Sarina Ill, MD  [facility] [address] [place], [place] [address]  Phone [phone] Fax [phone]

## 2020-09-09 ENCOUNTER — Encounter: Payer: Self-pay | Admitting: Neurology

## 2020-09-09 ENCOUNTER — Telehealth: Payer: Self-pay | Admitting: Neurology

## 2020-09-09 ENCOUNTER — Ambulatory Visit: Payer: Managed Care, Other (non HMO) | Admitting: Neurology

## 2020-09-09 NOTE — Telephone Encounter (Signed)
Patient no-showed new-patient appointment. If she calls back, she can schedule with any physician but please explain [facility]'s policy; If she no-shows 2 new patient appointments or reschedules multiple times within 24 hours notice then she may be dismissed from our practice. thanks

## 2020-09-24 ENCOUNTER — Ambulatory Visit: Payer: Medicaid Other | Admitting: Family Medicine

## 2020-09-25 MED FILL — GABAPENTIN 300 MG CAPSULE: 300 | 30 days supply | Qty: 120 | Fill #2

## 2020-09-25 MED FILL — SERTRALINE HCL 100 MG TAB: 100 | 30 days supply | Qty: 30 | Fill #2

## 2020-09-25 MED FILL — BUTALB-ACETAMIN-CAFF 50-325: 50-325-40 | 1 days supply | Qty: 5 | Fill #0

## 2020-09-25 MED FILL — hydrOXYzine HCL 25 MG TABS: 25 | 10 days supply | Qty: 30 | Fill #2

## 2020-10-06 ENCOUNTER — Emergency Department (HOSPITAL_BASED_OUTPATIENT_CLINIC_OR_DEPARTMENT_OTHER): Payer: Self-pay

## 2020-10-06 ENCOUNTER — Encounter (HOSPITAL_BASED_OUTPATIENT_CLINIC_OR_DEPARTMENT_OTHER): Payer: Self-pay | Admitting: *Deleted

## 2020-10-06 ENCOUNTER — Other Ambulatory Visit: Payer: Self-pay

## 2020-10-06 ENCOUNTER — Emergency Department (HOSPITAL_BASED_OUTPATIENT_CLINIC_OR_DEPARTMENT_OTHER)
Admission: EM | Admit: 2020-10-06 | Discharge: 2020-10-06 | Disposition: A | Discharge status: Home / Self Care | Payer: Self-pay | Attending: Emergency Medicine | Admitting: Emergency Medicine

## 2020-10-06 DIAGNOSIS — R109 Unspecified abdominal pain: Secondary | ICD-10-CM

## 2020-10-06 DIAGNOSIS — J45909 Unspecified asthma, uncomplicated: Secondary | ICD-10-CM | POA: Insufficient documentation

## 2020-10-06 DIAGNOSIS — Z7951 Long term (current) use of inhaled steroids: Secondary | ICD-10-CM | POA: Insufficient documentation

## 2020-10-06 DIAGNOSIS — E119 Type 2 diabetes mellitus without complications: Secondary | ICD-10-CM | POA: Insufficient documentation

## 2020-10-06 DIAGNOSIS — Z7984 Long term (current) use of oral hypoglycemic drugs: Secondary | ICD-10-CM | POA: Insufficient documentation

## 2020-10-06 DIAGNOSIS — R1032 Left lower quadrant pain: Secondary | ICD-10-CM | POA: Insufficient documentation

## 2020-10-06 DIAGNOSIS — Z87891 Personal history of nicotine dependence: Secondary | ICD-10-CM | POA: Insufficient documentation

## 2020-10-06 DIAGNOSIS — R112 Nausea with vomiting, unspecified: Secondary | ICD-10-CM | POA: Insufficient documentation

## 2020-10-06 DIAGNOSIS — R1012 Left upper quadrant pain: Secondary | ICD-10-CM | POA: Insufficient documentation

## 2020-10-06 DIAGNOSIS — E876 Hypokalemia: Secondary | ICD-10-CM | POA: Insufficient documentation

## 2020-10-06 LAB — CBC WITH DIFFERENTIAL/PLATELET
Abs Immature Granulocytes: 0.02 10*3/uL (ref 0.00–0.07)
Basophils Absolute: 0 10*3/uL (ref 0.0–0.1)
Basophils Relative: 0 %
Eosinophils Absolute: 0.2 10*3/uL (ref 0.0–0.5)
Eosinophils Relative: 3 %
HCT: 39 % (ref 36.0–46.0)
Hemoglobin: 13.5 g/dL (ref 12.0–15.0)
Immature Granulocytes: 0 %
Lymphocytes Relative: 52 %
Lymphs Abs: 4.6 10*3/uL — ABNORMAL HIGH (ref 0.7–4.0)
MCH: 30.4 pg (ref 26.0–34.0)
MCHC: 34.6 g/dL (ref 30.0–36.0)
MCV: 87.8 fL (ref 80.0–100.0)
Monocytes Absolute: 0.4 10*3/uL (ref 0.1–1.0)
Monocytes Relative: 5 %
Neutro Abs: 3.6 10*3/uL (ref 1.7–7.7)
Neutrophils Relative %: 40 %
Platelets: 350 10*3/uL (ref 150–400)
RBC: 4.44 MIL/uL (ref 3.87–5.11)
RDW: 12.7 % (ref 11.5–15.5)
WBC: 8.9 10*3/uL (ref 4.0–10.5)
nRBC: 0 % (ref 0.0–0.2)

## 2020-10-06 LAB — COMPREHENSIVE METABOLIC PANEL
ALT: 19 U/L (ref 0–44)
AST: 21 U/L (ref 15–41)
Albumin: 4 g/dL (ref 3.5–5.0)
Alkaline Phosphatase: 79 U/L (ref 38–126)
Anion gap: 11 (ref 5–15)
BUN: 15 mg/dL (ref 6–20)
CO2: 27 mmol/L (ref 22–32)
Calcium: 9.2 mg/dL (ref 8.9–10.3)
Chloride: 101 mmol/L (ref 98–111)
Creatinine, Ser: 0.88 mg/dL (ref 0.44–1.00)
GFR, Estimated: >60 mL/min (ref >60)
Glucose, Bld: 106 mg/dL — ABNORMAL HIGH (ref 70–99)
Potassium: 3.2 mmol/L — ABNORMAL LOW (ref 3.5–5.1)
Sodium: 139 mmol/L (ref 135–145)
Total Bilirubin: 0.5 mg/dL (ref 0.3–1.2)
Total Protein: 7.4 g/dL (ref 6.5–8.1)

## 2020-10-06 LAB — URINALYSIS, ROUTINE W REFLEX MICROSCOPIC
Bilirubin Urine: NEGATIVE
Glucose, UA: NEGATIVE mg/dL
Hgb urine dipstick: NEGATIVE
Ketones, ur: NEGATIVE mg/dL
Leukocytes,Ua: NEGATIVE
Nitrite: NEGATIVE
Protein, ur: NEGATIVE mg/dL
Specific Gravity, Urine: 1.01 (ref 1.005–1.030)
pH: 8.5 — ABNORMAL HIGH (ref 5.0–8.0)

## 2020-10-06 MED ORDER — POTASSIUM CHLORIDE CRYS ER 20 MEQ PO TBCR
40.0000 meq | EXTENDED_RELEASE_TABLET | Freq: Once | ORAL | Status: AC
  Administered 2020-10-06: 40 meq via ORAL
  Filled 2020-10-06: qty 2

## 2020-10-06 NOTE — Discharge Instructions (Addendum)
Follow-up with your primary care provider if pain persists.  Return to the ER for worsening or concerning symptoms.

## 2020-10-06 NOTE — ED Notes (Signed)
Patient transported to CT 

## 2020-10-06 NOTE — ED Provider Notes (Signed)
[facility]ERGENCY DEPARTMENT Provider Note   CSN: [id number] Arrival date & time: 10/06/20  1452     History Chief Complaint  Patient presents with  . Back Pain    Michelle Gibson is a 44 y.o. female.  44 year old female with past medical history of kidney stones, diabetes presents with complaint of left flank pain onset this morning, worse with movement, pain is constant, radiates to left side abdomen.  Associate with nausea vomiting.  Denies hematuria, frequency, dysuria, changes in bowel habits.  Pain is similar to prior kidney stones.  No other complaints or concerns.  Prior abdominal surgeries include laparoscopic surgery for endometriosis.        Past Medical History:  Diagnosis Date  . Acute tear lateral meniscus, right, initial encounter 08/29/2019  . Asthma   . Closed fracture of upper end of fibula 09/21/2018  . Diabetes mellitus without complication ([place])    TYPE II  . Hyperglycemia 09/15/2016  . Migraines   . WPW (Wolff-Parkinson-White syndrome)    s/p ablation at age 34    Patient Active Problem List   Diagnosis Date Noted  . Acute tear lateral meniscus, right, initial encounter 08/29/2019  . Acute medial meniscus tear of right knee 08/22/2019  . Fever and chills 11/17/2018  . Closed fracture of upper end of fibula 09/21/2018  . WPW (Wolff-Parkinson-White syndrome) 10/10/2017  . Asthma with acute exacerbation 09/15/2016  . Hyperglycemia 09/15/2016  . History of Wolff-Parkinson-White (WPW) syndrome 09/15/2016  . Acute respiratory failure with hypoxia ([place]) 09/15/2016  . Bronchospasm 09/11/2016    Past Surgical History:  Procedure Laterality Date  . ABDOMINAL HYSTERECTOMY    . CARDIAC SURGERY    . CESAREAN SECTION    . KNEE ARTHROSCOPY WITH LATERAL MENISECTOMY Right 08/29/2019   Procedure: PARTIAL LATERAL MENISECTOMY;  Surgeon: Leandrew Koyanagi, MD;  Location: [facility];  Service: Orthopedics;  Laterality: Right;  . KNEE  ARTHROSCOPY WITH MEDIAL MENISECTOMY Right 08/29/2019   Procedure: RIGHT KNEE ARTHROSCOPY WITH PARTIAL MEDIAL MENISECTOMY;  Surgeon: Leandrew Koyanagi, MD;  Location: [facility];  Service: Orthopedics;  Laterality: Right;     OB History   No obstetric history on file.     Family History  Problem Relation Age of Onset  . Hypertension Mother   . CAD Mother 64  . Diabetes Mother   . Hypertension Father   . CAD Father 52       CABG    Social History   Tobacco Use  . Smoking status: Former Smoker    Packs/day: 0.10    Types: Cigarettes  . Smokeless tobacco: Never Used  Vaping Use  . Vaping Use: Never used  Substance Use Topics  . Alcohol use: No  . Drug use: No    Home Medications Prior to Admission medications   Medication Sig Start Date End Date Taking? Authorizing Provider  Blood Glucose Monitoring Suppl (TRUE METRIX AIR GLUCOSE METER) w/Device KIT 1 kit by Does not apply route 2 (two) times daily. 09/19/18   Fulp, Cammie, MD  butalbital-acetaminophen-caffeine (FIORICET) 50-325-40 MG tablet Take 1-2 tablets by mouth every 8 (eight) hours as needed for headache or migraine. 07/07/20 07/07/21  Petrucelli, Glynda Jaeger, PA-C  Cetirizine HCl 10 MG CAPS Take 1 capsule (10 mg total) by mouth daily for 10 days. 07/17/20 07/27/20  Wieters, Hallie C, PA-C  empagliflozin (JARDIANCE) 10 MG TABS tablet Take 1 tablet (10 mg total) by mouth daily. Must have office  visit for refills 06/24/20   Camillia Herter, NP  fluticasone [facility]) 50 MCG/ACT nasal spray Place 2 sprays into both nostrils daily. 07/08/20   Tasia Catchings, Amy V, PA-C  gabapentin (NEURONTIN) 300 MG capsule Take 2 capsules (600 mg total) by mouth 2 (two) times daily. As needed; needs office visit 06/24/20   Camillia Herter, NP  glucose blood (TRUE METRIX BLOOD GLUCOSE TEST) test strip Use as instructed to check blood sugars twice daily and as needed 09/19/18   Fulp, Cammie, MD  predniSONE (DELTASONE) 20 MG tablet Take 1 tablet (20  mg total) by mouth daily with breakfast. 07/17/20   Wieters, Hallie C, PA-C  sertraline (ZOLOFT) 100 MG tablet Take 1 tablet (100 mg total) by mouth daily. Needs office visit 06/24/20   Camillia Herter, NP  SUMAtriptan (IMITREX) 25 MG tablet 25 mg 1 tab PO at the start of the headache. May repeat in 2 hours x 1 if headache persists. Max of 2 tabs/24 hours 06/24/20   Camillia Herter, NP  tamsulosin (FLOMAX) 0.4 MG CAPS capsule Take 1 capsule (0.4 mg total) by mouth daily. 07/12/20   Maudie Flakes, MD  TRUEplus Lancets 28G MISC USE TWICE DAILY WHEN CHECKING BLOOD SUGARS AND AS NEEDED 11/15/19   Fulp, Cammie, MD  metFORMIN (GLUCOPHAGE) 500 MG tablet Take 1 tablet (500 mg total) by mouth 2 (two) times daily with a meal. 08/23/18 07/13/19  Fulp, Cammie, MD  metoCLOPramide (REGLAN) 10 MG tablet Take 1 tablet (10 mg total) by mouth every 8 (eight) hours as needed for nausea or vomiting (headache). Patient not taking: Reported on 07/14/2020 07/07/20 07/17/20  Petrucelli, Glynda Jaeger, PA-C  traZODone (DESYREL) 50 MG tablet Take 0.5-1 tablets (25-50 mg total) by mouth at bedtime as needed for sleep. 06/04/19 07/13/19  Fulp, Cammie, MD    Allergies    Ciprofloxacin, Flecainide, and Penicillin g  Review of Systems   Review of Systems  Constitutional: Negative for chills and fever.  Respiratory: Negative for shortness of breath.   Cardiovascular: Negative for chest pain.  Gastrointestinal: Positive for abdominal pain, nausea and vomiting. Negative for blood in stool, constipation and diarrhea.  Genitourinary: Negative for dysuria, frequency, hematuria, vaginal bleeding and vaginal discharge.  Musculoskeletal: Positive for back pain. Negative for arthralgias and myalgias.  Skin: Negative for rash.  Allergic/Immunologic: Positive for immunocompromised state.  Neurological: Negative for weakness.  Psychiatric/Behavioral: Negative for confusion.  All other systems reviewed and are negative.   Physical Exam Updated  Vital Signs BP 110/74   Pulse 78   Temp 98 F (36.7 C)   Resp 16   Ht '5\' 6"'  (1.676 m)   Wt 81 kg   SpO2 99%   BMI 28.82 kg/m   Physical Exam Vitals and nursing note reviewed.  Constitutional:      General: She is not in acute distress.    Appearance: She is well-developed and well-nourished. She is not diaphoretic.  HENT:     Head: Normocephalic and atraumatic.  Cardiovascular:     Rate and Rhythm: Normal rate and regular rhythm.     Heart sounds: Normal heart sounds.  Pulmonary:     Effort: Pulmonary effort is normal.     Breath sounds: Normal breath sounds.  Abdominal:     Palpations: Abdomen is soft.     Tenderness: There is abdominal tenderness in the left upper quadrant and left lower quadrant. There is no right CVA tenderness or left CVA tenderness.  Comments: Mild left side abdominal tenderness.  Skin:    General: Skin is warm and dry.     Findings: No erythema or rash.  Neurological:     Mental Status: She is alert and oriented to person, place, and time.  Psychiatric:        Mood and Affect: Mood and affect normal.        Behavior: Behavior normal.     ED Results / Procedures / Treatments   Labs (all labs ordered are listed, but only abnormal results are displayed) Labs Reviewed  URINALYSIS, ROUTINE W REFLEX MICROSCOPIC - Abnormal; Notable for the following components:      Result Value   APPearance CLOUDY (*)    pH 8.5 (*)    All other components within normal limits  COMPREHENSIVE METABOLIC PANEL - Abnormal; Notable for the following components:   Potassium 3.2 (*)    Glucose, Bld 106 (*)    All other components within normal limits  CBC WITH DIFFERENTIAL/PLATELET - Abnormal; Notable for the following components:   Lymphs Abs 4.6 (*)    All other components within normal limits    EKG None  Radiology CT Renal Stone Study  Result Date: 10/06/2020 CLINICAL DATA:  Flank pain, kidney stone suspected Back pain and vomiting for 3 days. EXAM: CT  ABDOMEN AND PELVIS WITHOUT CONTRAST TECHNIQUE: Multidetector CT imaging of the abdomen and pelvis was performed following the standard protocol without IV contrast. COMPARISON:  Abdominal CT 07/12/2020 FINDINGS: Lower chest: Lung bases are clear. Hepatobiliary: No focal abnormality in the included liver, dome excluded per protocol given recent renal protocol CT. Clips in the gallbladder fossa postcholecystectomy. No biliary dilatation. Pancreas: No ductal dilatation or inflammation. Spleen: Normal in size without focal abnormality. Adrenals/Urinary Tract: No adrenal nodule. Previous proximal left ureteral calculus has cleared. Extrarenal pelvis configuration of the left kidney with resolved hydronephrosis from prior exam. There is a punctate nonobstructing stone in the upper right kidney. Extrarenal pelvis configuration of the right kidney without hydronephrosis. There are no ureteral stones. The urinary bladder is near completely empty. No bladder stone or wall thickening. Stomach/Bowel: Included stomach is unremarkable. There is no small bowel obstruction or inflammatory change. Appendectomy. Small volume of colonic stool. No colonic wall thickening or pericolonic inflammation. Occasional left colonic diverticula. No diverticulitis. There is transverse colonic tortuosity. Vascular/Lymphatic: Normal caliber abdominal aorta. No bulky abdominopelvic adenopathy. Reproductive: Hysterectomy.  No adnexal mass. Other: No ascites, free air or focal fluid collection. No abdominal wall hernia. Musculoskeletal: There are no acute or suspicious osseous abnormalities. Scattered Schmorl's nodes in the upper lumbar spine. Right gluteal battery pack with lead terminating in the subcutaneous tissues just to the left of midline overlying the sacrum, unchanged from prior. IMPRESSION: 1. Punctate nonobstructing stone in the right kidney. No hydronephrosis or obstructive uropathy. 2. Previous left ureteral calculus has cleared. 3.  Minimal colonic diverticulosis without diverticulitis. Electronically Signed   By: Keith Rake M.D.   On: 10/06/2020 22:01    Procedures Procedures (including critical care time)  Medications Ordered in ED Medications  potassium chloride SA (KLOR-CON) CR tablet 40 mEq (40 mEq Oral Given 10/06/20 2210)    ED Course  I have reviewed the triage vital signs and the nursing notes.  Pertinent labs & imaging results that were available during my care of the patient were reviewed by me and considered in my medical decision making (see chart for details).  Clinical Course as of 10/06/20 2221  Mon Oct 06, 2020  2217 44 yo female with complaint of left flank pain and vomiting today. On exam, no CVA tenderness, does have mild left side abdominal pain.  CT ordered to evaluate for stone vs pyelonephritis vs diverticulitis. CBC WNL, CMP with mild hypokalemia at 3.2- given oral K, no Vurther vomiting. UA negative for blood or infection.  CT negative for acute process. Discussed results with patient, patient is tolerating Pos. Pain is to take home meds for pain and follow up with PCP if pain persists.  [LM]    Clinical Course User Index [LM] Roque Lias   MDM Rules/Calculators/A&P                          Final Clinical Impression(s) / ED Diagnoses Final diagnoses:  Left flank pain  Hypokalemia    Rx / DC Orders ED Discharge Orders    None       Tacy Learn, PA-C 10/06/20 2221    Wyvonnia Dusky, MD 10/07/20 [phone]

## 2020-10-06 NOTE — ED Triage Notes (Signed)
Back pain and vomiting x 2 days. Decreased urine output.

## 2020-10-29 ENCOUNTER — Encounter (INDEPENDENT_AMBULATORY_CARE_PROVIDER_SITE_OTHER): Payer: Self-pay

## 2020-10-29 ENCOUNTER — Encounter: Payer: Self-pay | Admitting: Nurse Practitioner

## 2020-10-29 ENCOUNTER — Other Ambulatory Visit: Payer: Self-pay | Admitting: Nurse Practitioner

## 2020-10-29 ENCOUNTER — Ambulatory Visit: Payer: Medicaid Other | Attending: Family Medicine | Admitting: Nurse Practitioner

## 2020-10-29 ENCOUNTER — Other Ambulatory Visit: Payer: Self-pay

## 2020-10-29 DIAGNOSIS — Z87442 Personal history of urinary calculi: Secondary | ICD-10-CM

## 2020-10-29 DIAGNOSIS — E1142 Type 2 diabetes mellitus with diabetic polyneuropathy: Secondary | ICD-10-CM

## 2020-10-29 DIAGNOSIS — F32A Depression, unspecified: Secondary | ICD-10-CM

## 2020-10-29 DIAGNOSIS — E1165 Type 2 diabetes mellitus with hyperglycemia: Secondary | ICD-10-CM

## 2020-10-29 DIAGNOSIS — F419 Anxiety disorder, unspecified: Secondary | ICD-10-CM

## 2020-10-29 DIAGNOSIS — R519 Headache, unspecified: Secondary | ICD-10-CM

## 2020-10-29 DIAGNOSIS — G8929 Other chronic pain: Secondary | ICD-10-CM

## 2020-10-29 MED ORDER — TRUEPLUS LANCETS 28G MISC
6 refills | Status: DC
Start: 2020-10-29 — End: 2020-12-10

## 2020-10-29 MED ORDER — SERTRALINE HCL 100 MG PO TABS: 100.0000 mg | ORAL_TABLET | Freq: Every day | ORAL | 1 refills | Status: DC

## 2020-10-29 MED ORDER — PREGABALIN 25 MG PO CAPS: 25.0000 mg | ORAL_CAPSULE | Freq: Two times a day (BID) | ORAL | 0 refills | Status: DC

## 2020-10-29 MED ORDER — CYCLOBENZAPRINE HCL 5 MG PO TABS: 5.0000 mg | ORAL_TABLET | Freq: Two times a day (BID) | ORAL | 1 refills | Status: DC | PRN

## 2020-10-29 MED ORDER — TOPIRAMATE 50 MG PO TABS: 50.0000 mg | ORAL_TABLET | Freq: Every day | ORAL | 3 refills | Status: DC

## 2020-10-29 MED ORDER — TAMSULOSIN HCL 0.4 MG PO CAPS: 0.4000 mg | ORAL_CAPSULE | Freq: Every day | ORAL | 0 refills | Status: DC

## 2020-10-29 MED ORDER — TRUE METRIX BLOOD GLUCOSE TEST VI STRP
ORAL_STRIP | 6 refills | Status: DC
Start: 2020-10-29 — End: 2020-12-09

## 2020-10-29 MED ORDER — METFORMIN HCL 500 MG PO TABS: 500.0000 mg | ORAL_TABLET | Freq: Two times a day (BID) | ORAL | 6 refills | Status: DC

## 2020-10-29 MED ORDER — GABAPENTIN 300 MG PO CAPS: 600.0000 mg | ORAL_CAPSULE | Freq: Two times a day (BID) | ORAL | 3 refills | Status: DC

## 2020-10-29 MED FILL — TOPIRAMATE 50 MG TABLET: 50 | 30 days supply | Qty: 30 | Fill #0

## 2020-10-29 MED FILL — GABAPENTIN 300 MG CAPSULE: 300 | 30 days supply | Qty: 120 | Fill #0

## 2020-10-29 MED FILL — PREGABALIN 25 MG CAPS: 25 | 30 days supply | Qty: 60 | Fill #0

## 2020-10-29 MED FILL — TAMSULOSIN HCL 0.4 MG CAP: 0.4 | 7 days supply | Qty: 7 | Fill #0

## 2020-10-29 MED FILL — TRUEplus LANCETS 28G MISC: 33 days supply | Qty: 100 | Fill #0

## 2020-10-29 MED FILL — TRUE METRIX GLUCOSE TEST ST: 30 days supply | Qty: 100 | Fill #0

## 2020-10-29 MED FILL — SERTRALINE HCL 100 MG TAB: 100 | 30 days supply | Qty: 30 | Fill #0

## 2020-10-29 MED FILL — METFORMIN HCL 500 MG TABS: 500 | 30 days supply | Qty: 60 | Fill #0

## 2020-10-29 MED FILL — CYCLOBENZAPRINE 5 MG TABLET: 5 | 15 days supply | Qty: 30 | Fill #0

## 2020-10-29 NOTE — Progress Notes (Signed)
Virtual Visit via Telephone Note Due to national recommendations of social distancing due to [place] 19, telehealth visit is felt to be most appropriate for this patient at this time.  I discussed the limitations, risks, security and privacy concerns of performing an evaluation and management service by telephone and the availability of in person appointments. I also discussed with the patient that there may be a patient responsible charge related to this service. The patient expressed understanding and agreed to proceed.    I connected with Abra Dawn Zabawa on 10/29/20  at   2:10 PM EST  EDT by telephone and verified that I am speaking with the correct person using two identifiers.   Consent I discussed the limitations, risks, security and privacy concerns of performing an evaluation and management service by telephone and the availability of in person appointments. I also discussed with the patient that there may be a patient responsible charge related to this service. The patient expressed understanding and agreed to proceed.   Location of Patient: Private  Residence   Location of Provider: [place] and [employer] Office    Persons participating in Telemedicine visit: [name] FNP-BC [place]    History of Present Illness: Telemedicine visit for: Establish Care  has a past medical history of Acute tear lateral meniscus, right, initial encounter (08/29/2019), Asthma, Closed fracture of upper end of fibula (09/21/2018), Diabetes mellitus without complication ([place]), Hyperglycemia (09/15/2016), Migraines, and WPW (Wolff-Parkinson-White syndrome).   Migraines Chronic and ongoing for years. She has been instructed in the past to follow up with Neurology but could not afford the out of pocket cost. Currently taking fioricet which decreases the intensity of the headaches but does not provide complete relief. Has tried imitrex in the past which was  ineffective. She avoids caffeine as often as possible.  Headaches are occurring several times per week and can last for hours. She states she was told last== t that she had herniation of the cervical spine.  day lasting for hours AURU: neck stiffness, photosensitivity Has had migraines for years.  atient has been advised to apply for financial assistance and schedule to see our Development worker, community. She has been a patient at the clinic for 3 years.   DM2 Well controlled. Weight is currently down from 210 to 170lbs. Drinking lots of water, monitoring her diet and exercising. Jardiance causes frequent yeast infections. Hyperglycemic symptoms include neuropathy in both feet.  Currently taking gabapentin 600 mg BID which has been only minimally effective.  Highest A1c she believes was around 8. She has not had a lipid panel drawn and is not on ACE or Statin at this time.  Lab Results  Component Value Date   HGBA1C 6.2 (A) 06/24/2020   BP Readings from Last 3 Encounters:  10/06/20 110/74  07/17/20 110/77  07/14/20 110/76    Past Medical History:  Diagnosis Date  . Acute tear lateral meniscus, right, initial encounter 08/29/2019  . Asthma   . Closed fracture of upper end of fibula 09/21/2018  . Diabetes mellitus without complication ([place])    TYPE II  . Hyperglycemia 09/15/2016  . Migraines   . WPW (Wolff-Parkinson-White syndrome)    s/p ablation at age 8    Past Surgical History:  Procedure Laterality Date  . ABDOMINAL HYSTERECTOMY    . CARDIAC SURGERY    . CESAREAN SECTION    . KNEE ARTHROSCOPY WITH LATERAL MENISECTOMY Right 08/29/2019   Procedure: PARTIAL LATERAL MENISECTOMY;  Surgeon:  Leandrew Koyanagi, MD;  Location: [facility];  Service: Orthopedics;  Laterality: Right;  . KNEE ARTHROSCOPY WITH MEDIAL MENISECTOMY Right 08/29/2019   Procedure: RIGHT KNEE ARTHROSCOPY WITH PARTIAL MEDIAL MENISECTOMY;  Surgeon: Leandrew Koyanagi, MD;  Location: [facility];   Service: Orthopedics;  Laterality: Right;    Family History  Problem Relation Age of Onset  . Hypertension Mother   . CAD Mother 29  . Diabetes Mother   . Hypertension Father   . CAD Father 67       CABG    Social History   Socioeconomic History  . Marital status: Divorced    Spouse name: Not on file  . Number of children: Not on file  . Years of education: Not on file  . Highest education level: Not on file  Occupational History  . Not on file  Tobacco Use  . Smoking status: Former Smoker    Packs/day: 0.10    Types: Cigarettes  . Smokeless tobacco: Never Used  Vaping Use  . Vaping Use: Never used  Substance and Sexual Activity  . Alcohol use: No  . Drug use: No  . Sexual activity: Yes  Other Topics Concern  . Not on file  Social History Narrative  . Not on file   Social Determinants of Health   Financial Resource Strain: Not on file  Food Insecurity: Not on file  Transportation Needs: Not on file  Physical Activity: Not on file  Stress: Not on file  Social Connections: Not on file     Observations/Objective: Awake, alert and oriented x 3   Review of Systems  Constitutional: Negative for fever, malaise/fatigue and weight loss.  HENT: Negative.  Negative for nosebleeds.   Eyes: Negative.  Negative for blurred vision, double vision and photophobia.  Respiratory: Negative.  Negative for cough and shortness of breath.   Cardiovascular: Negative.  Negative for chest pain, palpitations and leg swelling.  Gastrointestinal: Negative.  Negative for heartburn, nausea and vomiting.  Musculoskeletal: Positive for neck pain. Negative for myalgias.  Neurological: Positive for tingling, sensory change and headaches. Negative for dizziness, focal weakness and seizures.  Psychiatric/Behavioral: Positive for depression. Negative for suicidal ideas. The patient is nervous/anxious.    .phq Assessment and Plan: Nisha was seen today for establish care.  Diagnoses and all  orders for this visit:  Type 2 diabetes mellitus with hyperglycemia, without long-term current use of insulin (HCC) -     metFORMIN (GLUCOPHAGE) 500 MG tablet; Take 1 tablet (500 mg total) by mouth 2 (two) times daily with a meal. -     glucose blood (TRUE METRIX BLOOD GLUCOSE TEST) test strip; Use as instructed to check blood sugars three times daily -     TRUEplus Lancets 28G MISC; Use as instructed. Check blood glucose level by fingerstick three times per day. Continue blood sugar control as discussed in office today, low carbohydrate diet, and regular physical exercise as tolerated, 150 minutes per week (30 min each day, 5 days per week, or 50 min 3 days per week). Keep blood sugar logs with fasting goal of 90-130 mg/dl, post prandial (after you eat) less than 180.  For Hypoglycemia: BS <60 and Hyperglycemia BS >400; contact the clinic ASAP. Annual eye exams and foot exams are recommended.   Diabetic peripheral neuropathy associated with type 2 diabetes mellitus (HCC) -     pregabalin (LYRICA) 25 MG capsule; Take 1 capsule (25 mg total) by mouth 2 (two) times  daily. -     gabapentin (NEURONTIN) 300 MG capsule; Take 2 capsules (600 mg total) by mouth 2 (two) times daily.  Chronic nonintractable headache, unspecified headache type -     topiramate (TOPAMAX) 50 MG tablet; Take 1 tablet (50 mg total) by mouth daily. -     cyclobenzaprine (FLEXERIL) 5 MG tablet; Take 1 tablet (5 mg total) by mouth 2 (two) times daily as needed for muscle spasms.  History of nephrolithiasis -     tamsulosin (FLOMAX) 0.4 MG CAPS capsule; Take 1 capsule (0.4 mg total) by mouth daily.  Anxiety and depression -     sertraline (ZOLOFT) 100 MG tablet; Take 1 tablet (100 mg total) by mouth daily. Well controlled      Follow Up Instructions Return in about 3 months (around 01/27/2021).     I discussed the assessment and treatment plan with the patient. The patient was provided an opportunity to ask questions and  all were answered. The patient agreed with the plan and demonstrated an understanding of the instructions.   The patient was advised to call back or seek an in-person evaluation if the symptoms worsen or if the condition fails to improve as anticipated.  I provided 20 minutes of non-face-to-face time during this encounter including median intraservice time, reviewing previous notes, labs, imaging, medications and explaining diagnosis and management.  Gildardo Pounds, FNP-BC

## 2020-11-03 ENCOUNTER — Telehealth: Payer: Self-pay | Admitting: Nurse Practitioner

## 2020-11-03 NOTE — Telephone Encounter (Signed)
She had an appointment with Neurology that she did not keep. Also needs to be evaluated in the emergency room if she is still having seizures/headaches

## 2020-11-03 NOTE — Telephone Encounter (Signed)
Copied from [place] [phone]. Topic: General - Other >> Nov 03, 2020  8:35 AM Leward Quan A wrote: Reason for CRM: Patient called in to inform Archie Patten that she had a seizure on 10/31/20 and did not go to the ER but also have been having migraine since. Say that the pain goes into her jaws, forehead and her shoulders and she have not been sleeping very well either. Is taking her prescribed medication  Ph# [phone]

## 2020-11-04 ENCOUNTER — Other Ambulatory Visit: Payer: Medicaid Other

## 2020-11-07 ENCOUNTER — Other Ambulatory Visit: Payer: Self-pay | Admitting: Nurse Practitioner

## 2020-11-07 ENCOUNTER — Other Ambulatory Visit: Payer: Medicaid Other

## 2020-11-07 DIAGNOSIS — Z1159 Encounter for screening for other viral diseases: Secondary | ICD-10-CM

## 2020-11-07 DIAGNOSIS — E1165 Type 2 diabetes mellitus with hyperglycemia: Secondary | ICD-10-CM

## 2020-11-07 DIAGNOSIS — Z114 Encounter for screening for human immunodeficiency virus [HIV]: Secondary | ICD-10-CM

## 2020-11-07 DIAGNOSIS — E785 Hyperlipidemia, unspecified: Secondary | ICD-10-CM

## 2020-11-13 NOTE — Telephone Encounter (Signed)
Attempt to reach patient to inform on PCP advising. No answer and LVM for a call back.  

## 2020-12-09 ENCOUNTER — Ambulatory Visit: Payer: Self-pay | Attending: Nurse Practitioner | Admitting: Nurse Practitioner

## 2020-12-09 ENCOUNTER — Other Ambulatory Visit: Payer: Self-pay

## 2020-12-09 ENCOUNTER — Encounter: Payer: Self-pay | Admitting: Nurse Practitioner

## 2020-12-09 ENCOUNTER — Other Ambulatory Visit: Payer: Self-pay | Admitting: Nurse Practitioner

## 2020-12-09 DIAGNOSIS — F419 Anxiety disorder, unspecified: Secondary | ICD-10-CM

## 2020-12-09 DIAGNOSIS — F32A Depression, unspecified: Secondary | ICD-10-CM

## 2020-12-09 DIAGNOSIS — E1142 Type 2 diabetes mellitus with diabetic polyneuropathy: Secondary | ICD-10-CM

## 2020-12-09 DIAGNOSIS — R519 Headache, unspecified: Secondary | ICD-10-CM

## 2020-12-09 DIAGNOSIS — E119 Type 2 diabetes mellitus without complications: Secondary | ICD-10-CM

## 2020-12-09 DIAGNOSIS — R1011 Right upper quadrant pain: Secondary | ICD-10-CM

## 2020-12-09 DIAGNOSIS — G8929 Other chronic pain: Secondary | ICD-10-CM

## 2020-12-09 MED ORDER — ATORVASTATIN CALCIUM 20 MG PO TABS: 20.0000 mg | ORAL_TABLET | Freq: Every day | ORAL | 3 refills | Status: DC

## 2020-12-09 MED ORDER — TRUE METRIX BLOOD GLUCOSE TEST VI STRP
ORAL_STRIP | 6 refills | Status: DC
Start: 2020-12-09 — End: 2020-12-10

## 2020-12-09 MED ORDER — INSULIN GLARGINE 100 UNIT/ML SOLOSTAR PEN: 20.0000 [IU] | PEN_INJECTOR | Freq: Every day | SUBCUTANEOUS | 11 refills | Status: DC

## 2020-12-09 MED ORDER — PREGABALIN 50 MG PO CAPS
50.0000 mg | ORAL_CAPSULE | Freq: Three times a day (TID) | ORAL | 0 refills | Status: DC
Start: 2020-12-09 — End: 2020-12-09

## 2020-12-09 MED ORDER — GABAPENTIN 300 MG PO CAPS
600.0000 mg | ORAL_CAPSULE | Freq: Three times a day (TID) | ORAL | 3 refills | Status: DC
Start: 2020-12-09 — End: 2020-12-09

## 2020-12-09 MED ORDER — TOPIRAMATE 50 MG PO TABS: 50.0000 mg | ORAL_TABLET | Freq: Every day | ORAL | 0 refills | Status: DC

## 2020-12-09 MED ORDER — METFORMIN HCL 500 MG PO TABS
500.0000 mg | ORAL_TABLET | Freq: Two times a day (BID) | ORAL | 1 refills | Status: DC
Start: 2020-12-09 — End: 2021-06-12

## 2020-12-09 MED ORDER — SERTRALINE HCL 100 MG PO TABS
100.0000 mg | ORAL_TABLET | Freq: Every day | ORAL | 1 refills | Status: DC
Start: 2020-12-09 — End: 2020-12-09

## 2020-12-09 MED ORDER — BD PEN NEEDLE MINI U/F 31G X 5 MM MISC: 6 refills | Status: DC

## 2020-12-09 NOTE — Progress Notes (Signed)
Virtual Visit via VIDEO Note Due to national recommendations of social distancing due to [place] 57, [place] visit is felt to be most appropriate for this patient at this time.  I discussed the limitations, risks, security and privacy concerns of performing an evaluation and management service by telephone and the availability of in person appointments. I also discussed with the patient that there may be a patient responsible charge related to this service. The patient expressed understanding and agreed to proceed.    I connected with Michelle Gibson on 12/09/20  at   2:50 PM EST  EDT by VIDEO vist and verified that I am speaking with the correct person using two identifiers.   Consent I discussed the limitations, risks, security and privacy concerns of performing an evaluation and management service by VIDEO and the availability of in person appointments. I also discussed with the patient that there may be a patient responsible charge related to this service. The patient expressed understanding and agreed to proceed.   Location of Patient: Private  Residence   Location of Provider: [place] and [employer] Office    Persons participating in [place] visit: Michelle Rankins FNP-BC [place]    History of Present Illness: VIDEO  visit for: Follow up She has a past medical history of Acute tear lateral meniscus, right (08/29/2019), Asthma, Closed fracture of upper end of fibula (09/21/2018), DM, nephrolithiasis,  Hyperglycemia (09/15/2016), Migraines, and WPW  She endorses left sided flank pain similar to when she was diagnosed with kidney stones in the past.  The pain is described as aching, pressure-like, throbbing and sharp. Onset was several weeks ago. Symptoms have been gradually worsening since. Aggravating factors: none.  Alleviating factors: NSAIDs. Associated symptoms: nausea. The patient denies constipation, diarrhea, dysuria and hematuria. SHe never followed up  with Urology last year after being referred however today she states she has an appt with them on 01-06-2021  Neurology problem She believes she has been experiencing seizures. Endorses symptoms of tremors, Head shaking, and involuntary arm movement. States last occurrence was yesterday. She also has Migraines for which she takes topamax 50 mg daily.   OSA symptoms Endorses snoring, daytime headaches, night time awakenings with witnessed apneic episodes.   DM 2 States home readings have been in the 300s. Per her report she was taken off lantus by a provider however I can not find any record where she was instructed to stop lantus by anyone. She will need to resume this. Was previously on jardiance however this was stopped as it caused chronic yeast infections.  She is currently taking metformin 500 mg BID. Hyperglycemic symptoms include neuropathy for which she is taking gabapentin 600 mg BID. She was started on lyrica 25 mg BID in January however she states neither are effective. I will increase lyrica to 50 mg. If ineffective she will need to see a neurologist for neuropathy or be referred to pain management.  Lab Results  Component Value Date   HGBA1C 6.2 (A) 06/24/2020     Past Medical History:  Diagnosis Date  . Acute tear lateral meniscus, right, initial encounter 08/29/2019  . Asthma   . Closed fracture of upper end of fibula 09/21/2018  . Diabetes mellitus without complication ([place])    TYPE II  . Hyperglycemia 09/15/2016  . Migraines   . WPW (Wolff-Parkinson-White syndrome)    s/p ablation at age 24    Past Surgical History:  Procedure Laterality Date  . ABDOMINAL HYSTERECTOMY    .  CARDIAC SURGERY    . CESAREAN SECTION    . KNEE ARTHROSCOPY WITH LATERAL MENISECTOMY Right 08/29/2019   Procedure: PARTIAL LATERAL MENISECTOMY;  Surgeon: Leandrew Koyanagi, MD;  Location: [facility];  Service: Orthopedics;  Laterality: Right;  . KNEE ARTHROSCOPY WITH MEDIAL MENISECTOMY  Right 08/29/2019   Procedure: RIGHT KNEE ARTHROSCOPY WITH PARTIAL MEDIAL MENISECTOMY;  Surgeon: Leandrew Koyanagi, MD;  Location: [facility];  Service: Orthopedics;  Laterality: Right;    Family History  Problem Relation Age of Onset  . Hypertension Mother   . CAD Mother 68  . Diabetes Mother   . Hypertension Father   . CAD Father 95       CABG    Social History   Socioeconomic History  . Marital status: Divorced    Spouse name: Not on file  . Number of children: Not on file  . Years of education: Not on file  . Highest education level: Not on file  Occupational History  . Not on file  Tobacco Use  . Smoking status: Former Smoker    Packs/day: 0.10    Types: Cigarettes  . Smokeless tobacco: Never Used  Vaping Use  . Vaping Use: Never used  Substance and Sexual Activity  . Alcohol use: No  . Drug use: No  . Sexual activity: Yes  Other Topics Concern  . Not on file  Social History Narrative  . Not on file   Social Determinants of Health   Financial Resource Strain: Not on file  Food Insecurity: Not on file  Transportation Needs: Not on file  Physical Activity: Not on file  Stress: Not on file  Social Connections: Not on file     Observations/Objective: Awake, alert and oriented x 3   ROS  Assessment and Plan: Diagnoses and all orders for this visit:  Type 2 diabetes mellitus without complication, without long-term current use of insulin (HCC) -     insulin glargine (LANTUS) 100 UNIT/ML Solostar Pen; Inject 20 Units into the skin daily at 10 pm. -     glucose blood (TRUE METRIX BLOOD GLUCOSE TEST) test strip; Use as instructed to check blood sugars three times daily -     metFORMIN (GLUCOPHAGE) 500 MG tablet; Take 1 tablet (500 mg total) by mouth 2 (two) times daily with a meal. -     Insulin Pen Needle (B-D UF III MINI PEN NEEDLES) 31G X 5 MM MISC; Use as instructed. Check blood glucose level by fingerstick twice  per day. -     atorvastatin  (LIPITOR) 20 MG tablet; Take 1 tablet (20 mg total) by mouth daily. Continue blood sugar control as discussed in office today, low carbohydrate diet, and regular physical exercise as tolerated, 150 minutes per week (30 min each day, 5 days per week, or 50 min 3 days per week). Keep blood sugar logs with fasting goal of 90-130 mg/dl, post prandial (after you eat) less than 180.  For Hypoglycemia: BS <60 and Hyperglycemia BS >400; contact the clinic ASAP. Annual eye exams and foot exams are recommended.   Diabetic peripheral neuropathy associated with type 2 diabetes mellitus (HCC) -     pregabalin (LYRICA) 50 MG capsule; Take 1 capsule (50 mg total) by mouth 3 (three) times daily. -     gabapentin (NEURONTIN) 300 MG capsule; Take 2 capsules (600 mg total) by mouth 3 (three) times daily.  Right upper quadrant abdominal pain -     CT RENAL  STONE STUDY; Future  Anxiety and depression -     sertraline (ZOLOFT) 100 MG tablet; Take 1 tablet (100 mg total) by mouth daily.  Chronic nonintractable headache, unspecified headache type -     topiramate (TOPAMAX) 50 MG tablet; Take 1 tablet (50 mg total) by mouth daily.     Follow Up Instructions Return in about 8 weeks (around 02/03/2021).     I discussed the assessment and treatment plan with the patient. The patient was provided an opportunity to ask questions and all were answered. The patient agreed with the plan and demonstrated an understanding of the instructions.   The patient was advised to call back or seek an in-person evaluation if the symptoms worsen or if the condition fails to improve as anticipated.  I provided 20 minutes of non-face-to-face time during this encounter including median intraservice time, reviewing previous notes, labs, imaging, medications and explaining diagnosis and management.  Gildardo Pounds, FNP-BC

## 2020-12-10 ENCOUNTER — Encounter: Payer: Self-pay | Admitting: Orthopaedic Surgery

## 2020-12-10 ENCOUNTER — Telehealth: Payer: Self-pay

## 2020-12-10 ENCOUNTER — Ambulatory Visit (INDEPENDENT_AMBULATORY_CARE_PROVIDER_SITE_OTHER): Payer: Medicaid Other | Admitting: Orthopaedic Surgery

## 2020-12-10 ENCOUNTER — Other Ambulatory Visit: Payer: Self-pay | Admitting: Family Medicine

## 2020-12-10 ENCOUNTER — Ambulatory Visit (INDEPENDENT_AMBULATORY_CARE_PROVIDER_SITE_OTHER): Payer: Self-pay

## 2020-12-10 DIAGNOSIS — E119 Type 2 diabetes mellitus without complications: Secondary | ICD-10-CM

## 2020-12-10 DIAGNOSIS — S83511A Sprain of anterior cruciate ligament of right knee, initial encounter: Secondary | ICD-10-CM

## 2020-12-10 MED ORDER — ONETOUCH VERIO W/DEVICE KIT: PACK | 0 refills | Status: AC

## 2020-12-10 MED ORDER — ONETOUCH VERIO VI STRP: ORAL_STRIP | 2 refills | Status: DC

## 2020-12-10 MED ORDER — ONETOUCH DELICA LANCETS 33G MISC: 2 refills | Status: AC

## 2020-12-10 MED FILL — PREGABALIN 50 MG CAPS: 50 | 30 days supply | Qty: 90 | Fill #0

## 2020-12-10 MED FILL — ONE TOUCH ULTRA TEST STRIPS: 16 days supply | Qty: 50 | Fill #0

## 2020-12-10 MED FILL — METFORMIN HCL 500 MG TABS: 500 | 30 days supply | Qty: 60 | Fill #0

## 2020-12-10 MED FILL — TRUEPLUS PEN NDL 31GX3/16: 31G X 5 MM | 30 days supply | Qty: 100 | Fill #0

## 2020-12-10 MED FILL — ONE TOUCH VERIO TEST STRIP: 30 days supply | Qty: 100 | Fill #0

## 2020-12-10 MED FILL — TOPIRAMATE 50 MG TABLET: 50 | 30 days supply | Qty: 30 | Fill #0

## 2020-12-10 MED FILL — ONETOUCH VERIO REFLECT W/DE: W/DEVICE | 30 days supply | Qty: 1 | Fill #0

## 2020-12-10 MED FILL — SERTRALINE HCL 100 MG TAB: 100 | 30 days supply | Qty: 30 | Fill #0

## 2020-12-10 MED FILL — ONETOUCH DELICA PLUS LANCET: 30 days supply | Qty: 100 | Fill #0

## 2020-12-10 MED FILL — ATORVASTATIN CALCIUM 20 MG: 20 | 30 days supply | Qty: 30 | Fill #0

## 2020-12-10 MED FILL — GABAPENTIN 300 MG CAPSULE: 300 | 30 days supply | Qty: 180 | Fill #0

## 2020-12-10 NOTE — Addendum Note (Signed)
Addended by: Daisy Blossom, Annie Main L on: 12/10/2020 11:48 AM   Modules accepted: Orders

## 2020-12-10 NOTE — Telephone Encounter (Signed)
Patient's insurance prefers one touch ultra products.  Can you send in scripts for meter and lancets?

## 2020-12-10 NOTE — Progress Notes (Signed)
Office Visit Note   Patient: Michelle Gibson           Date of Birth: March 21, 1976           MRN: [id number] Visit Date: 12/10/2020              Requested by: No referring provider defined for this encounter. PCP: Gildardo Pounds, NP   Assessment & Plan: Visit Diagnoses:  1. Complete tear of right ACL, initial encounter     Plan: Impression is chronic right ACL tear.  She is experiencing instability and pain due to deficient ACL.  She has tried strengthening and bracing without much improvement.  Based on treatment options I have recommended ACL reconstruction with quadriceps allograft.  Risk benefits rehab recovery reviewed with the patient in detail.  Anticipated out of work for 3 months at least due to lack of light duty.  Follow-Up Instructions: Return for 10-day postoperative visit.   Orders:  Orders Placed This Encounter  Procedures  . XR KNEE 3 VIEW RIGHT   No orders of the defined types were placed in this encounter.     Procedures: No procedures performed   Clinical Data: No additional findings.   Subjective: Chief Complaint  Patient presents with  . Right Knee - Pain    Michelle Gibson returns today for chronic right knee problems.  She states that at work she is unable to her job duties because of constant instability buckling giving way of the right knee.  This is made worse by pivoting or changing directions..  She has tried taking ibuprofen used a knee brace without significant improvement.   Review of Systems  Constitutional: Negative.   HENT: Negative.   Eyes: Negative.   Respiratory: Negative.   Cardiovascular: Negative.   Endocrine: Negative.   Musculoskeletal: Negative.   Neurological: Negative.   Hematological: Negative.   Psychiatric/Behavioral: Negative.   All other systems reviewed and are negative.    Objective: Vital Signs: There were no vitals taken for this visit.  Physical Exam Vitals and nursing note reviewed.  Constitutional:       Appearance: She is well-developed and well-nourished.  Pulmonary:     Effort: Pulmonary effort is normal.  Skin:    General: Skin is warm.     Capillary Refill: Capillary refill takes less than 2 seconds.  Neurological:     Mental Status: She is alert and oriented to person, place, and time.  Psychiatric:        Mood and Affect: Mood and affect normal.        Behavior: Behavior normal.        Thought Content: Thought content normal.        Judgment: Judgment normal.     Ortho Exam Right knee shows fully healed surgical scars.  No joint effusion.  Positive Lachman, anterior drawer, and pivot shift.  Collaterals are stable.  No joint line tenderness.  Mild crepitus with range of motion.  Normal range of motion.  Specialty Comments:  No specialty comments available.  Imaging: No results found.   PMFS History: Patient Active Problem List   Diagnosis Date Noted  . Complete tear of right ACL, initial encounter 12/10/2020  . Acute tear lateral meniscus, right, initial encounter 08/29/2019  . Acute medial meniscus tear of right knee 08/22/2019  . Fever and chills 11/17/2018  . Closed fracture of upper end of fibula 09/21/2018  . WPW (Wolff-Parkinson-White syndrome) 10/10/2017  . Asthma with acute exacerbation 09/15/2016  .  Hyperglycemia 09/15/2016  . History of Wolff-Parkinson-White (WPW) syndrome 09/15/2016  . Acute respiratory failure with hypoxia ([place]) 09/15/2016  . Bronchospasm 09/11/2016   Past Medical History:  Diagnosis Date  . Acute tear lateral meniscus, right, initial encounter 08/29/2019  . Asthma   . Closed fracture of upper end of fibula 09/21/2018  . Diabetes mellitus without complication ([place])    TYPE II  . Hyperglycemia 09/15/2016  . Migraines   . WPW (Wolff-Parkinson-White syndrome)    s/p ablation at age 21    Family History  Problem Relation Age of Onset  . Hypertension Mother   . CAD Mother 56  . Diabetes Mother   . Hypertension Father   . CAD  Father 48       CABG    Past Surgical History:  Procedure Laterality Date  . ABDOMINAL HYSTERECTOMY    . CARDIAC SURGERY    . CESAREAN SECTION    . KNEE ARTHROSCOPY WITH LATERAL MENISECTOMY Right 08/29/2019   Procedure: PARTIAL LATERAL MENISECTOMY;  Surgeon: Leandrew Koyanagi, MD;  Location: [facility];  Service: Orthopedics;  Laterality: Right;  . KNEE ARTHROSCOPY WITH MEDIAL MENISECTOMY Right 08/29/2019   Procedure: RIGHT KNEE ARTHROSCOPY WITH PARTIAL MEDIAL MENISECTOMY;  Surgeon: Leandrew Koyanagi, MD;  Location: [facility];  Service: Orthopedics;  Laterality: Right;   Social History   Occupational History  . Not on file  Tobacco Use  . Smoking status: Former Smoker    Packs/day: 0.10    Types: Cigarettes  . Smokeless tobacco: Never Used  Vaping Use  . Vaping Use: Never used  Substance and Sexual Activity  . Alcohol use: No  . Drug use: No  . Sexual activity: Yes

## 2020-12-22 ENCOUNTER — Ambulatory Visit (HOSPITAL_COMMUNITY): Payer: Commercial Managed Care - PPO

## 2020-12-29 ENCOUNTER — Telehealth: Payer: Self-pay

## 2020-12-29 ENCOUNTER — Other Ambulatory Visit: Payer: Self-pay

## 2020-12-29 ENCOUNTER — Ambulatory Visit: Payer: Self-pay | Admitting: *Deleted

## 2020-12-29 ENCOUNTER — Other Ambulatory Visit: Payer: Self-pay | Admitting: Nurse Practitioner

## 2020-12-29 ENCOUNTER — Ambulatory Visit
Admission: EM | Admit: 2020-12-29 | Discharge: 2020-12-29 | Disposition: A | Discharge status: Home / Self Care | Payer: Commercial Managed Care - PPO | Attending: Family Medicine | Admitting: Family Medicine

## 2020-12-29 DIAGNOSIS — E119 Type 2 diabetes mellitus without complications: Secondary | ICD-10-CM

## 2020-12-29 DIAGNOSIS — Z1152 Encounter for screening for COVID-19: Secondary | ICD-10-CM

## 2020-12-29 DIAGNOSIS — E1165 Type 2 diabetes mellitus with hyperglycemia: Secondary | ICD-10-CM | POA: Diagnosis not present

## 2020-12-29 DIAGNOSIS — Z20822 Contact with and (suspected) exposure to covid-19: Secondary | ICD-10-CM | POA: Diagnosis not present

## 2020-12-29 DIAGNOSIS — J069 Acute upper respiratory infection, unspecified: Secondary | ICD-10-CM

## 2020-12-29 DIAGNOSIS — Z7984 Long term (current) use of oral hypoglycemic drugs: Secondary | ICD-10-CM | POA: Diagnosis not present

## 2020-12-29 DIAGNOSIS — E860 Dehydration: Secondary | ICD-10-CM

## 2020-12-29 DIAGNOSIS — R739 Hyperglycemia, unspecified: Secondary | ICD-10-CM

## 2020-12-29 LAB — POCT URINALYSIS DIP (MANUAL ENTRY)
Bilirubin, UA: NEGATIVE
Blood, UA: NEGATIVE
Glucose, UA: 100 mg/dL — AB
Ketones, POC UA: NEGATIVE mg/dL
Leukocytes, UA: NEGATIVE
Nitrite, UA: NEGATIVE
Protein Ur, POC: NEGATIVE mg/dL
Spec Grav, UA: >=1.03 — AB (ref 1.010–1.025)
Urobilinogen, UA: 0.2 E.U./dL
pH, UA: 6 (ref 5.0–8.0)

## 2020-12-29 LAB — POCT FASTING CBG KUC MANUAL ENTRY: POCT Glucose (KUC): 195 mg/dL — AB (ref 70–99)

## 2020-12-29 MED ORDER — CETIRIZINE HCL 10 MG PO CAPS: 10.0000 mg | ORAL_CAPSULE | Freq: Every day | ORAL | 0 refills | Status: DC

## 2020-12-29 MED ORDER — FLUTICASONE PROPIONATE 50 MCG/ACT NA SUSP: 2.0000 | Freq: Every day | NASAL | 0 refills | Status: DC

## 2020-12-29 MED ORDER — EMPAGLIFLOZIN 10 MG PO TABS: 10.0000 mg | ORAL_TABLET | Freq: Every day | ORAL | 1 refills | Status: DC

## 2020-12-29 MED ORDER — PSEUDOEPHEDRINE HCL 30 MG PO TABS: 30.0000 mg | ORAL_TABLET | ORAL | 0 refills | Status: DC | PRN

## 2020-12-29 MED FILL — FLUTICASONE PROP 50 MCG SPR: 50 | 30 days supply | Qty: 16 | Fill #0

## 2020-12-29 MED FILL — CETIRIZINE HCL 10 MG TABS: 10 | 30 days supply | Qty: 30 | Fill #0

## 2020-12-29 MED FILL — JARDIANCE 10 MG TABLET: 10 | 30 days supply | Qty: 30 | Fill #0

## 2020-12-29 NOTE — Discharge Instructions (Addendum)
Message was sent to your primary care provider regarding your elevated blood sugars over the last few days.  Someone from your primary care office will contact you regarding blood sugars.  However if your blood sugars go beyond 500 or if your machine is reading high that is indication to go immediately to the emergency department for evaluation of a hyperglycemic emergency.  Your urine shows dehydration and is very important to ensure that you are drinking a minimum of 5 to 16.9 ounce bottles of water while awake to ensure that you are very well-hydrated.  Your Covid test should result within the next 2 days or so however this is contingent on the testing volume.  Our office will only contact you if your results are positive and results were sent directly to [facility].

## 2020-12-29 NOTE — Telephone Encounter (Signed)
Pt called stating her CBG is elevated since 12/26/20 her blood sugars have been 360-400; the pt says she has a cold; her fasting blood sugar at 0800 12/29/20 was  fasting 236 at 0800; the pt takes Lantus 20 units at 2200 and Metformin 500 mg bid with a meal; she has not missed any doses; her cbgs were: 12/25/20 0800 (fasting) 246, 368 1 hour after eating 12/26/20 at 0800 (fasting) 348; 392 at noon; 400 at 1800 12/27/20 at 0800 (fasting) 268; 363 at noon; 400 at 1800  12/28/20 at 0800 (fasting) 248; 373 at noon; 392 at 1800 12/29/20 at 0800 (fasting 236); she is having polydipsia and polyphagia; the pt says she has been taking Ibuprofen and she is not sure if she has a fever but she has episodes of chills and sweating; the pt says she has right eye pain rated at 5 out of 10 and constant since 12/26/20; the pt also says she had issues with her vision due to cataracts; she also has a runny nose since 12/25/20; the pt has been having nausea and headaches (rated 7 out of 10, intermittent) since 12/22/20; she has been out without a mask; recommendations made per nurse triage protocol; she verbalized understanding and would like to discuss this with her PCP before going to the ED; [facility]; the pt can be contacted at [phone]; will route to office for notification. Reason for Disposition . Patient sounds very sick or weak to the triager  Answer Assessment - Initial Assessment Questions 1. BLOOD GLUCOSE: "What is your blood glucose level?"      Fasting 236  2. ONSET: "When did you check the blood glucose?"     12/29/20 at 0800 3. USUAL RANGE: "What is your glucose level usually?" (e.g., usual fasting morning value, usual evening value)      4. KETONES: "Do you check for ketones (urine or blood test strips)?" If yes, ask: "What does the test show now?"    no 5. TYPE 1 or 2:  "Do you know what type of diabetes you have?"  (e.g., Type 1, Type 2, Gestational; doesn't know)      Type 2 6. INSULIN: "Do you take insulin?" "What type of insulin(s) do you use? What is the mode of delivery? (syringe, pen; injection or pump)?"      Lantus; no missed doses 7. DIABETES PILLS: "Do you take any pills for your diabetes?" If yes, ask: "Have you missed taking any pills recently?"     Metformin; no missed doses 8. OTHER SYMPTOMS: "Do you have any symptoms?" (e.g., fever, frequent urination, difficulty breathing, dizziness, weakness, vomiting)     Polyphagia, polydipsia 9. PREGNANCY: "Is there any chance you are pregnant?" "When was your last menstrual period?"  Protocols used: DIABETES - HIGH BLOOD SUGAR-A-AH

## 2020-12-29 NOTE — Telephone Encounter (Signed)
Increase lantus to 20 units in the am and continue 20 units in the pm. Restarting jardiance. She can continue on metformin 500 mg BID.

## 2020-12-29 NOTE — Telephone Encounter (Signed)
MyChart Message.

## 2020-12-29 NOTE — Telephone Encounter (Signed)
Will forward to provider  

## 2020-12-29 NOTE — ED Provider Notes (Signed)
[name]    CSN: [id number] Arrival date & time: 12/29/20  0932      History   Chief Complaint Chief Complaint  Patient presents with  . Cough  . Headache    HPI Michelle Gibson is a 45 y.o. female.   HPI Patient presents today with symptoms cough, runny nose, sore throat, headache, chills, and loss of taste/smell since Friday.  She also endorses that her blood sugars have been greater than 400 symptoms started.  She notified her primary care provider today regarding increased blood sugars, per EMR message has been routed to PCP.  Patient is unaware of any close sick contacts. Denies chest pain or SOB. Endorses overall fatigue.   Past Medical History:  Diagnosis Date  . Acute tear lateral meniscus, right, initial encounter 08/29/2019  . Asthma   . Closed fracture of upper end of fibula 09/21/2018  . Diabetes mellitus without complication ([place])    TYPE II  . Hyperglycemia 09/15/2016  . Migraines   . WPW (Wolff-Parkinson-White syndrome)    s/p ablation at age 69    Patient Active Problem List   Diagnosis Date Noted  . Complete tear of right ACL, initial encounter 12/10/2020  . Acute tear lateral meniscus, right, initial encounter 08/29/2019  . Acute medial meniscus tear of right knee 08/22/2019  . Fever and chills 11/17/2018  . Closed fracture of upper end of fibula 09/21/2018  . WPW (Wolff-Parkinson-White syndrome) 10/10/2017  . Asthma with acute exacerbation 09/15/2016  . Hyperglycemia 09/15/2016  . History of Wolff-Parkinson-White (WPW) syndrome 09/15/2016  . Acute respiratory failure with hypoxia ([place]) 09/15/2016  . Bronchospasm 09/11/2016    Past Surgical History:  Procedure Laterality Date  . ABDOMINAL HYSTERECTOMY    . CARDIAC SURGERY    . CESAREAN SECTION    . KNEE ARTHROSCOPY WITH LATERAL MENISECTOMY Right 08/29/2019   Procedure: PARTIAL LATERAL MENISECTOMY;  Surgeon: Leandrew Koyanagi, MD;  Location: [place];  Service:  Orthopedics;  Laterality: Right;  . KNEE ARTHROSCOPY WITH MEDIAL MENISECTOMY Right 08/29/2019   Procedure: RIGHT KNEE ARTHROSCOPY WITH PARTIAL MEDIAL MENISECTOMY;  Surgeon: Leandrew Koyanagi, MD;  Location: [place];  Service: Orthopedics;  Laterality: Right;    OB History   No obstetric history on file.      Home Medications    Prior to Admission medications   Medication Sig Start Date End Date Taking? Authorizing Provider  atorvastatin (LIPITOR) 20 MG tablet Take 1 tablet (20 mg total) by mouth daily. 12/09/20   Gildardo Pounds, NP  Blood Glucose Monitoring Suppl (ONETOUCH VERIO) w/Device KIT Use to check blood sugar TID. 12/10/20   Charlott Rakes, MD  Cetirizine HCl 10 MG CAPS Take 1 capsule (10 mg total) by mouth daily for 10 days. 07/17/20 07/27/20  Wieters, Hallie C, PA-C  cyclobenzaprine (FLEXERIL) 5 MG tablet Take 1 tablet (5 mg total) by mouth 2 (two) times daily as needed for muscle spasms. 10/29/20   Gildardo Pounds, NP  fluticasone (FLONASE) 50 MCG/ACT nasal spray Place 2 sprays into both nostrils daily. 07/08/20   Tasia Catchings, Amy V, PA-C  gabapentin (NEURONTIN) 300 MG capsule Take 2 capsules (600 mg total) by mouth 3 (three) times daily. 12/09/20 01/08/21  Gildardo Pounds, NP  glucose blood [facility] VERIO) test strip Use to check blood sugar TID. 12/10/20   Charlott Rakes, MD  insulin glargine (LANTUS) 100 UNIT/ML Solostar Pen Inject 20 Units into the skin daily at 10 pm.  12/09/20 01/08/21  Gildardo Pounds, NP  Insulin Pen Needle (B-D UF III MINI PEN NEEDLES) 31G X 5 MM MISC Use as instructed. Check blood glucose level by fingerstick twice  per day. 12/09/20   Gildardo Pounds, NP  metFORMIN (GLUCOPHAGE) 500 MG tablet Take 1 tablet (500 mg total) by mouth 2 (two) times daily with a meal. 12/09/20 03/09/21  Gildardo Pounds, NP  OneTouch Delica Lancets 41Y MISC Use to check blood sugar TID. 12/10/20   Charlott Rakes, MD  pregabalin (LYRICA) 50 MG capsule Take 1 capsule (50 mg total) by  mouth 3 (three) times daily. 12/09/20 01/08/21  Gildardo Pounds, NP  sertraline (ZOLOFT) 100 MG tablet Take 1 tablet (100 mg total) by mouth daily. 12/09/20 03/09/21  Gildardo Pounds, NP  tamsulosin (FLOMAX) 0.4 MG CAPS capsule Take 1 capsule (0.4 mg total) by mouth daily. 10/29/20   Gildardo Pounds, NP  topiramate (TOPAMAX) 50 MG tablet Take 1 tablet (50 mg total) by mouth daily. 12/09/20 03/09/21  Gildardo Pounds, NP  metoCLOPramide (REGLAN) 10 MG tablet Take 1 tablet (10 mg total) by mouth every 8 (eight) hours as needed for nausea or vomiting (headache). Patient not taking: Reported on 07/14/2020 07/07/20 07/17/20  Petrucelli, Glynda Jaeger, PA-C  traZODone (DESYREL) 50 MG tablet Take 0.5-1 tablets (25-50 mg total) by mouth at bedtime as needed for sleep. 06/04/19 07/13/19  Antony Blackbird, MD    Family History Family History  Problem Relation Age of Onset  . Hypertension Mother   . CAD Mother 83  . Diabetes Mother   . Hypertension Father   . CAD Father 21       CABG    Social History Social History   Tobacco Use  . Smoking status: Former Smoker    Packs/day: 0.10    Types: Cigarettes  . Smokeless tobacco: Never Used  Vaping Use  . Vaping Use: Never used  Substance Use Topics  . Alcohol use: No  . Drug use: No     Allergies   Ciprofloxacin, Flecainide, and Penicillin g   Review of Systems Review of Systems Pertinent negatives listed in HPI  Physical Exam Triage Vital Signs ED Triage Vitals  Enc Vitals Group     BP 12/29/20 1120 129/85     Pulse Rate 12/29/20 1120 100     Resp 12/29/20 1120 18     Temp 12/29/20 1120 98.4 F (36.9 C)     Temp Source 12/29/20 1120 Oral     SpO2 12/29/20 1120 97 %     Weight --      Height --      Head Circumference --      Peak Flow --      Pain Score 12/29/20 1121 4     Pain Loc --      Pain Edu? --      Excl. in [place]? --    No data found.  Updated Vital Signs BP 129/85 (BP Location: Left Arm)   Pulse 100   Temp 98.4 F (36.9 C)  (Oral)   Resp 18   SpO2 97%   Visual Acuity Right Eye Distance:   Left Eye Distance:   Bilateral Distance:    Right Eye Near:   Left Eye Near:    Bilateral Near:     Physical Exam Constitutional: Patient appears well-developed and well-nourished.  HENT: Normocephalic, atraumatic, External right and left ear normal. Nares congested bilaterally.  Oropharynx erythematous w/o exudate  or tonsillar enlargement   Eyes: Conjunctivae and EOM are normal. PERRLA, no scleral icterus. Neck: Normal ROM. Neck supple. No JVD. No tracheal deviation. No thyromegaly. CVS: RRR. Tachycardia, S1/S2 +, no murmurs, no gallops, no carotid bruit.  Pulmonary: Effort and breath sounds normal, no stridor, rhonchi, wheezes, rales.  Neuro: Alert. Normal gait, muscle tone coordination. No cranial nerve deficit. Skin: Skin is warm and dry. No rash noted. Not diaphoretic. No erythema. No pallor. Psychiatric: Normal mood and affect. Behavior, judgment, thought content normal.   UC Treatments / Results  Labs (all labs ordered are listed, but only abnormal results are displayed) Labs Reviewed  POCT FASTING CBG [facility] MANUAL ENTRY - Abnormal; Notable for the following components:      Result Value   POCT Glucose ([facility]) 195 (*)    All other components within normal limits  COVID-19, FLU A+B NAA  POCT URINALYSIS DIP (MANUAL ENTRY)    EKG   Radiology No results found.  Procedures Procedures (including critical care time)  Medications Ordered in UC Medications - No data to display  Initial Impression / Assessment and Plan / UC Course  I have reviewed the triage vital signs and the nursing notes.  Pertinent labs & imaging results that were available during my care of the patient were reviewed by me and considered in my medical decision making (see chart for details).    COVID/FLU test pending. Glucose 195, patient is non fasting. She is awaiting medication adjustment recommendation from PCP. Glucose  non-critical at present. Urine indicates dehydration, recommend increase intake of plain water. Symptom management per discharge medications. ER if symptoms worsen or if BS are persistently elevated. Final Clinical Impressions(s) / UC Diagnoses   Final diagnoses:  Encounter for screening laboratory testing for COVID-19 virus  Viral URI with cough  Hyperglycemia  Dehydration     Discharge Instructions     Message was sent to your primary care provider regarding your elevated blood sugars over the last few days.  Someone from your primary care office will contact you regarding blood sugars.  However if your blood sugars go beyond 500 or if your machine is reading high that is indication to go immediately to the emergency department for evaluation of a hyperglycemic emergency.  Your urine shows dehydration and is very important to ensure that you are drinking a minimum of 5 to 16.9 ounce bottles of water while awake to ensure that you are very well-hydrated.  Your Covid test should result within the next 2 days or so however this is contingent on the testing volume.  Our office will only contact you if your results are positive and results were sent directly to [place].    ED Prescriptions    Medication Sig Dispense Auth. Provider   fluticasone (FLONASE) 50 MCG/ACT nasal spray Place 2 sprays into both nostrils daily. 1 g Scot Jun, FNP   Cetirizine HCl 10 MG CAPS Take 1 capsule (10 mg total) by mouth daily. 30 capsule Scot Jun, FNP   pseudoephedrine (SUDAFED) 30 MG tablet Take 1 tablet (30 mg total) by mouth every 4 (four) hours as needed for congestion. 30 tablet Scot Jun, FNP     PDMP not reviewed this encounter.   Scot Jun, FNP 01/01/21 2250

## 2020-12-29 NOTE — ED Triage Notes (Addendum)
Pt c/o cough, runny nose, sore throat, headache, chills, and loss of taste/smell since Friday. States is fully covid vaccine. Pt states her blood sugars have been over 400 since Friday with using sliding scale. States her fasting was 263 this am.

## 2020-12-29 NOTE — Telephone Encounter (Signed)
Returned pt call and went over provider response pt is aware and doesn't have any questions or concerns. Will send pt a MyChart message in regards to medications changes

## 2020-12-30 ENCOUNTER — Emergency Department (HOSPITAL_COMMUNITY)
Admission: EM | Admit: 2020-12-30 | Discharge: 2020-12-30 | Disposition: A | Discharge status: Home / Self Care | Payer: Medicaid Other | Attending: Emergency Medicine | Admitting: Emergency Medicine

## 2020-12-30 ENCOUNTER — Encounter (HOSPITAL_COMMUNITY): Payer: Self-pay | Admitting: *Deleted

## 2020-12-30 ENCOUNTER — Other Ambulatory Visit: Payer: Self-pay

## 2020-12-30 DIAGNOSIS — R739 Hyperglycemia, unspecified: Secondary | ICD-10-CM | POA: Insufficient documentation

## 2020-12-30 DIAGNOSIS — Z5321 Procedure and treatment not carried out due to patient leaving prior to being seen by health care provider: Secondary | ICD-10-CM | POA: Insufficient documentation

## 2020-12-30 DIAGNOSIS — R0981 Nasal congestion: Secondary | ICD-10-CM | POA: Insufficient documentation

## 2020-12-30 DIAGNOSIS — Z794 Long term (current) use of insulin: Secondary | ICD-10-CM | POA: Insufficient documentation

## 2020-12-30 DIAGNOSIS — R0989 Other specified symptoms and signs involving the circulatory and respiratory systems: Secondary | ICD-10-CM | POA: Insufficient documentation

## 2020-12-30 DIAGNOSIS — Z20822 Contact with and (suspected) exposure to covid-19: Secondary | ICD-10-CM | POA: Insufficient documentation

## 2020-12-30 DIAGNOSIS — R439 Unspecified disturbances of smell and taste: Secondary | ICD-10-CM | POA: Insufficient documentation

## 2020-12-30 LAB — URINALYSIS, ROUTINE W REFLEX MICROSCOPIC
Bilirubin Urine: NEGATIVE
Glucose, UA: >=500 mg/dL — AB
Hgb urine dipstick: NEGATIVE
Ketones, ur: NEGATIVE mg/dL
Leukocytes,Ua: NEGATIVE
Nitrite: NEGATIVE
Protein, ur: NEGATIVE mg/dL
Specific Gravity, Urine: 1.028 (ref 1.005–1.030)
pH: 5 (ref 5.0–8.0)

## 2020-12-30 LAB — CBG MONITORING, ED: Glucose-Capillary: 414 mg/dL — ABNORMAL HIGH (ref 70–99)

## 2020-12-30 LAB — BASIC METABOLIC PANEL
Anion gap: 8 (ref 5–15)
BUN: 12 mg/dL (ref 6–20)
CO2: 25 mmol/L (ref 22–32)
Calcium: 8.9 mg/dL (ref 8.9–10.3)
Chloride: 101 mmol/L (ref 98–111)
Creatinine, Ser: 0.85 mg/dL (ref 0.44–1.00)
GFR, Estimated: >60 mL/min (ref >60)
Glucose, Bld: 420 mg/dL — ABNORMAL HIGH (ref 70–99)
Potassium: 3.9 mmol/L (ref 3.5–5.1)
Sodium: 134 mmol/L — ABNORMAL LOW (ref 135–145)

## 2020-12-30 LAB — CBC
HCT: 40.2 % (ref 36.0–46.0)
Hemoglobin: 13.6 g/dL (ref 12.0–15.0)
MCH: 30.7 pg (ref 26.0–34.0)
MCHC: 33.8 g/dL (ref 30.0–36.0)
MCV: 90.7 fL (ref 80.0–100.0)
Platelets: 351 10*3/uL (ref 150–400)
RBC: 4.43 MIL/uL (ref 3.87–5.11)
RDW: 12.8 % (ref 11.5–15.5)
WBC: 8.8 10*3/uL (ref 4.0–10.5)
nRBC: 0 % (ref 0.0–0.2)

## 2020-12-30 LAB — POC SARS CORONAVIRUS 2 AG -  ED: SARS Coronavirus 2 Ag: NEGATIVE

## 2020-12-30 LAB — COVID-19, FLU A+B NAA
Influenza A, NAA: NOT DETECTED
Influenza B, NAA: NOT DETECTED
SARS-CoV-2, NAA: NOT DETECTED

## 2020-12-30 NOTE — ED Triage Notes (Signed)
Pt c/o congestion, runny nose, and loss of taste/smell. Covid test still pending from UC visit 12/29/2020. Also reports hyperglycemia with readings 'high' usually controlled with insulin at home. Took20 units of lantus with sugar still reading high. Reports onset of palpitations last night that have become more frequent tonight

## 2020-12-30 NOTE — ED Notes (Signed)
Pt name called 3 times no answer

## 2021-01-01 ENCOUNTER — Telehealth: Payer: Self-pay

## 2021-01-01 NOTE — Telephone Encounter (Signed)
Patient is scheduled for surgery next Wednesday.  Surgery needs to be cancelled.  Currently patient has an upper respiratory infection, her blood sugars have been running over 400 and she thinks she is having seizures.She has had multiple ER visits.  Anesthesia wants medical and neurology clearance before rescheduling.

## 2021-01-01 NOTE — Telephone Encounter (Signed)
Debbie, I'll give her a call today to have her call you when she's ready for surgery.  Thank you.

## 2021-01-01 NOTE — Progress Notes (Signed)
PT  seen in the ED 12/29/20 & 12/30/20 for c/o URI, Cough runny nose. Also stated that her blood sugars have been running >400 at home. (420 in ED)  In communication with PCP on 11/03/20 pt told provider that she had a seizure on 10/31/20 and did not go to the ED. PT had previously been referred to neurology by pcp for seizures and headaches  but did not keep new pt appointment. Pt will need clearance from neurology and PCP prior to surgery. I spoke with Sherrie at Dr Phoebe Sharps office and let her know that case will need to be postponed.

## 2021-01-02 ENCOUNTER — Other Ambulatory Visit: Payer: Self-pay | Admitting: Pharmacist

## 2021-01-02 MED ORDER — SEMGLEE 100 UNIT/ML ~~LOC~~ SOPN: 20.0000 [IU] | PEN_INJECTOR | Freq: Every day | SUBCUTANEOUS | 11 refills | Status: DC

## 2021-01-02 MED FILL — SEMGLEE (YFGN) 100 UNIT/ML: 100 | 30 days supply | Qty: 6 | Fill #0

## 2021-01-06 ENCOUNTER — Telehealth: Payer: Self-pay | Admitting: Nurse Practitioner

## 2021-01-06 ENCOUNTER — Other Ambulatory Visit: Payer: Self-pay | Admitting: Urology

## 2021-01-06 DIAGNOSIS — R109 Unspecified abdominal pain: Secondary | ICD-10-CM

## 2021-01-06 NOTE — Telephone Encounter (Signed)
Patient called in to get a refill on her gabapentin. Please advise pt can be reached at [phone]

## 2021-01-06 NOTE — Telephone Encounter (Signed)
Rx was sent on 12/09/20. Pt has refills on medication. Please advise pt to follow up with pharmacy

## 2021-01-06 NOTE — Telephone Encounter (Signed)
Called Pt no answer. Left vm to reach out to pharmacy

## 2021-01-07 ENCOUNTER — Ambulatory Visit (HOSPITAL_BASED_OUTPATIENT_CLINIC_OR_DEPARTMENT_OTHER)
Admission: RE | Admit: 2021-01-07 | Payer: Commercial Managed Care - PPO | Source: Home / Self Care | Admitting: Orthopaedic Surgery

## 2021-01-07 ENCOUNTER — Encounter (HOSPITAL_BASED_OUTPATIENT_CLINIC_OR_DEPARTMENT_OTHER): Admission: RE | Payer: Self-pay | Source: Home / Self Care

## 2021-01-07 SURGERY — KNEE ARTHROSCOPY WITH ANTERIOR CRUCIATE LIGAMENT (ACL) REPAIR
Anesthesia: General | Site: Knee | Laterality: Right

## 2021-01-09 ENCOUNTER — Ambulatory Visit (HOSPITAL_COMMUNITY): Payer: Commercial Managed Care - PPO

## 2021-01-09 ENCOUNTER — Encounter (HOSPITAL_COMMUNITY): Payer: Self-pay

## 2021-01-14 ENCOUNTER — Encounter: Payer: Medicaid Other | Admitting: Family

## 2021-01-14 NOTE — Progress Notes (Signed)
Patient did not show for appointment.   

## 2021-01-16 ENCOUNTER — Ambulatory Visit: Payer: Medicaid Other | Admitting: Internal Medicine

## 2021-01-21 ENCOUNTER — Other Ambulatory Visit: Payer: Self-pay

## 2021-01-21 MED FILL — Sertraline HCl Tab 100 MG: ORAL | 30 days supply | Qty: 30 | Fill #0 | Status: AC

## 2021-01-21 MED FILL — Gabapentin Cap 300 MG: ORAL | 30 days supply | Qty: 180 | Fill #0 | Status: AC

## 2021-01-21 MED FILL — Pregabalin Cap 50 MG: ORAL | 30 days supply | Qty: 90 | Fill #0 | Status: AC

## 2021-01-26 ENCOUNTER — Ambulatory Visit: Payer: Commercial Managed Care - PPO

## 2021-02-04 ENCOUNTER — Other Ambulatory Visit: Payer: Self-pay | Admitting: Nurse Practitioner

## 2021-02-04 ENCOUNTER — Encounter: Payer: Self-pay | Admitting: Nurse Practitioner

## 2021-02-04 DIAGNOSIS — E1142 Type 2 diabetes mellitus with diabetic polyneuropathy: Secondary | ICD-10-CM

## 2021-02-07 ENCOUNTER — Encounter (HOSPITAL_COMMUNITY): Payer: Self-pay

## 2021-02-07 ENCOUNTER — Emergency Department (HOSPITAL_COMMUNITY)
Admission: EM | Admit: 2021-02-07 | Discharge: 2021-02-07 | Disposition: A | Discharge status: Home / Self Care | Payer: Commercial Managed Care - PPO | Attending: Emergency Medicine | Admitting: Emergency Medicine

## 2021-02-07 ENCOUNTER — Other Ambulatory Visit: Payer: Self-pay

## 2021-02-07 DIAGNOSIS — Z794 Long term (current) use of insulin: Secondary | ICD-10-CM | POA: Insufficient documentation

## 2021-02-07 DIAGNOSIS — Z20822 Contact with and (suspected) exposure to covid-19: Secondary | ICD-10-CM | POA: Diagnosis not present

## 2021-02-07 DIAGNOSIS — L539 Erythematous condition, unspecified: Secondary | ICD-10-CM | POA: Insufficient documentation

## 2021-02-07 DIAGNOSIS — J45909 Unspecified asthma, uncomplicated: Secondary | ICD-10-CM | POA: Insufficient documentation

## 2021-02-07 DIAGNOSIS — Z7984 Long term (current) use of oral hypoglycemic drugs: Secondary | ICD-10-CM | POA: Insufficient documentation

## 2021-02-07 DIAGNOSIS — Z87891 Personal history of nicotine dependence: Secondary | ICD-10-CM | POA: Insufficient documentation

## 2021-02-07 DIAGNOSIS — J029 Acute pharyngitis, unspecified: Secondary | ICD-10-CM | POA: Insufficient documentation

## 2021-02-07 DIAGNOSIS — E119 Type 2 diabetes mellitus without complications: Secondary | ICD-10-CM | POA: Diagnosis not present

## 2021-02-07 LAB — RESP PANEL BY RT-PCR (FLU A&B, COVID) ARPGX2
Influenza A by PCR: NEGATIVE
Influenza B by PCR: NEGATIVE
SARS Coronavirus 2 by RT PCR: NEGATIVE

## 2021-02-07 LAB — GROUP A STREP BY PCR: Group A Strep by PCR: NOT DETECTED

## 2021-02-07 MED ORDER — IBUPROFEN 200 MG PO TABS
600.0000 mg | ORAL_TABLET | Freq: Once | ORAL | Status: AC
  Administered 2021-02-07: 600 mg via ORAL
  Filled 2021-02-07: qty 3

## 2021-02-07 MED ORDER — ACETAMINOPHEN 500 MG PO TABS
1000.0000 mg | ORAL_TABLET | Freq: Once | ORAL | Status: AC
  Administered 2021-02-07: 1000 mg via ORAL
  Filled 2021-02-07: qty 2

## 2021-02-07 NOTE — ED Provider Notes (Signed)
[place] DEPT Provider Note   CSN: [id number] Arrival date & time: 02/07/21  0631     History Chief Complaint  Patient presents with  . Sore Throat    Michelle Gibson is a 45 y.o. female.  45 year old female with prior medical history as detailed below presents for evaluation.  Patient reports upper respiratory congestion and mild sore throat.  Symptoms began yesterday.  She does have sick contacts in the home with similar symptoms.  She denies fever.  She denies chest pain or shortness of breath.  She is vaccinated for COVID.  She denies known COVID exposure. She denies known exposure to strep positive patient either.   The history is provided by the patient and medical records.  Sore Throat This is a new problem. The current episode started yesterday. The problem occurs rarely. The problem has not changed since onset.Pertinent negatives include no chest pain, no abdominal pain, no headaches and no shortness of breath. Nothing aggravates the symptoms. Nothing relieves the symptoms.       Past Medical History:  Diagnosis Date  . Acute tear lateral meniscus, right, initial encounter 08/29/2019  . Asthma   . Closed fracture of upper end of fibula 09/21/2018  . Diabetes mellitus without complication ([place])    TYPE II  . Hyperglycemia 09/15/2016  . Migraines   . WPW (Wolff-Parkinson-White syndrome)    s/p ablation at age 30    Patient Active Problem List   Diagnosis Date Noted  . Complete tear of right ACL, initial encounter 12/10/2020  . Acute tear lateral meniscus, right, initial encounter 08/29/2019  . Acute medial meniscus tear of right knee 08/22/2019  . Fever and chills 11/17/2018  . Closed fracture of upper end of fibula 09/21/2018  . WPW (Wolff-Parkinson-White syndrome) 10/10/2017  . Asthma with acute exacerbation 09/15/2016  . Hyperglycemia 09/15/2016  . History of Wolff-Parkinson-White (WPW) syndrome 09/15/2016  . Acute  respiratory failure with hypoxia ([place]) 09/15/2016  . Bronchospasm 09/11/2016    Past Surgical History:  Procedure Laterality Date  . ABDOMINAL HYSTERECTOMY    . CARDIAC SURGERY    . CESAREAN SECTION    . KNEE ARTHROSCOPY WITH LATERAL MENISECTOMY Right 08/29/2019   Procedure: PARTIAL LATERAL MENISECTOMY;  Surgeon: Leandrew Koyanagi, MD;  Location: [place];  Service: Orthopedics;  Laterality: Right;  . KNEE ARTHROSCOPY WITH MEDIAL MENISECTOMY Right 08/29/2019   Procedure: RIGHT KNEE ARTHROSCOPY WITH PARTIAL MEDIAL MENISECTOMY;  Surgeon: Leandrew Koyanagi, MD;  Location: [place];  Service: Orthopedics;  Laterality: Right;     OB History   No obstetric history on file.     Family History  Problem Relation Age of Onset  . Hypertension Mother   . CAD Mother 63  . Diabetes Mother   . Hypertension Father   . CAD Father 44       CABG    Social History   Tobacco Use  . Smoking status: Former Smoker    Packs/day: 0.10    Types: Cigarettes  . Smokeless tobacco: Never Used  Vaping Use  . Vaping Use: Never used  Substance Use Topics  . Alcohol use: No  . Drug use: No    Home Medications Prior to Admission medications   Medication Sig Start Date End Date Taking? Authorizing Provider  insulin glargine (SEMGLEE) 100 UNIT/ML Solostar Pen Inject 20 Units into the skin daily. 01/02/21   Charlott Rakes, MD  atorvastatin (LIPITOR) 20 MG tablet TAKE 1  TABLET (20 MG TOTAL) BY MOUTH DAILY. 12/09/20 12/09/21  Gildardo Pounds, NP  Blood Glucose Monitoring Suppl (ONETOUCH VERIO REFLECT) w/Device KIT USE TO CHECK BLOOD SUGAR 3 TIMES DAILY 12/10/20 12/10/21  Charlott Rakes, MD  Blood Glucose Monitoring Suppl (ONETOUCH VERIO) w/Device KIT Use to check blood sugar TID. 12/10/20   Charlott Rakes, MD  cetirizine (ZYRTEC) 10 MG tablet TAKE 1 CAPSULE (10 MG TOTAL) BY MOUTH DAILY. 12/29/20 12/29/21  Scot Jun, FNP  Cetirizine HCl 10 MG CAPS Take 1 capsule (10 mg total) by  mouth daily. 12/29/20 01/28/21  Scot Jun, FNP  cyclobenzaprine (FLEXERIL) 5 MG tablet TAKE 1 TABLET (5 MG TOTAL) BY MOUTH 2 (TWO) TIMES DAILY AS NEEDED FOR MUSCLE SPASMS. 10/29/20 10/29/21  Gildardo Pounds, NP  empagliflozin (JARDIANCE) 10 MG TABS tablet TAKE 1 TABLET (10 MG TOTAL) BY MOUTH DAILY BEFORE BREAKFAST. 12/29/20 12/29/21  Gildardo Pounds, NP  fluticasone (FLONASE) 50 MCG/ACT nasal spray PLACE 2 SPRAYS INTO BOTH NOSTRILS DAILY. 12/29/20 12/29/21  Scot Jun, FNP  gabapentin (NEURONTIN) 300 MG capsule TAKE 2 CAPSULES (600 MG TOTAL) BY MOUTH 3 (THREE) TIMES DAILY. 12/09/20 12/09/21  Gildardo Pounds, NP  glucose blood test strip USE TO CHECK BLOOD SUGAR 3 TIMES DAILY 12/10/20 12/10/21  Charlott Rakes, MD  Insulin Glargine-yfgn 100 UNIT/ML SOPN INJECT 20 UNITS INTO THE SKIN DAILY. 01/02/21 01/02/22  Charlott Rakes, MD  Insulin Pen Needle 31G X 5 MM MISC USE AS INSTRUCTED. CHECK BLOOD GLUCOSE LEVEL BY FINGERSTICK TWICE PER DAY. 12/09/20 12/09/21  Gildardo Pounds, NP  Lancets (ONETOUCH DELICA PLUS [id number]) MISC USE TO CHECK BLOOD SUGAR 3 TIMES DAILY 12/10/20 12/10/21  Charlott Rakes, MD  metFORMIN (GLUCOPHAGE) 500 MG tablet Take 1 tablet (500 mg total) by mouth 2 (two) times daily with a meal. 12/09/20 03/09/21  Gildardo Pounds, NP  metFORMIN (GLUCOPHAGE) 500 MG tablet TAKE 1 TABLET (500 MG TOTAL) BY MOUTH 2 (TWO) TIMES DAILY WITH A MEAL. 12/10/20 12/10/21  Gildardo Pounds, NP  OneTouch Delica Lancets 57D MISC Use to check blood sugar TID. 12/10/20   Charlott Rakes, MD  pregabalin (LYRICA) 50 MG capsule TAKE 1 CAPSULE (50 MG TOTAL) BY MOUTH 3 (THREE) TIMES DAILY. 12/09/20 06/09/21  Gildardo Pounds, NP  pseudoephedrine (SUDAFED) 30 MG tablet TAKE 1 TABLET (30 MG TOTAL) BY MOUTH EVERY 4 (FOUR) HOURS AS NEEDED FOR CONGESTION. 12/29/20 12/29/21  Scot Jun, FNP  sertraline (ZOLOFT) 100 MG tablet TAKE 1 TABLET (100 MG TOTAL) BY MOUTH DAILY. 12/09/20 12/09/21  Gildardo Pounds, NP  tamsulosin (FLOMAX) 0.4 MG  CAPS capsule TAKE 1 CAPSULE (0.4 MG TOTAL) BY MOUTH DAILY. 10/29/20 10/29/21  Gildardo Pounds, NP  topiramate (TOPAMAX) 50 MG tablet TAKE 1 TABLET (50 MG TOTAL) BY MOUTH DAILY. 12/09/20 12/09/21  Gildardo Pounds, NP  metoCLOPramide (REGLAN) 10 MG tablet Take 1 tablet (10 mg total) by mouth every 8 (eight) hours as needed for nausea or vomiting (headache). Patient not taking: Reported on 07/14/2020 07/07/20 07/17/20  Petrucelli, Glynda Jaeger, PA-C  traZODone (DESYREL) 50 MG tablet Take 0.5-1 tablets (25-50 mg total) by mouth at bedtime as needed for sleep. 06/04/19 07/13/19  Fulp, Ander Gaster, MD    Allergies    Ciprofloxacin, Lyrica cr [pregabalin er], Flecainide, and Penicillin g  Review of Systems   Review of Systems  Respiratory: Negative for shortness of breath.   Cardiovascular: Negative for chest pain.  Gastrointestinal: Negative for abdominal pain.  Neurological: Negative  for headaches.  All other systems reviewed and are negative.   Physical Exam Updated Vital Signs BP 120/87   Pulse 99   Temp 98.5 F (36.9 C)   Resp 18   Ht '5\' 6"'  (1.676 m)   Wt 81.6 kg   SpO2 95%   BMI 29.05 kg/m   Physical Exam Vitals and nursing note reviewed.  Constitutional:      General: She is not in acute distress.    Appearance: She is well-developed.  HENT:     Head: Normocephalic and atraumatic.     Right Ear: Tympanic membrane normal.     Left Ear: Tympanic membrane normal.     Nose: Congestion present.     Mouth/Throat:     Mouth: Mucous membranes are dry. No oral lesions.     Pharynx: Posterior oropharyngeal erythema present. No pharyngeal swelling or oropharyngeal exudate.     Comments: Mild erythema of the posterior pharynx.  No evidence of PTA.  No exudates.  Uvula midline Eyes:     Conjunctiva/sclera: Conjunctivae normal.     Pupils: Pupils are equal, round, and reactive to light.  Cardiovascular:     Rate and Rhythm: Normal rate and regular rhythm.     Heart sounds: Normal heart sounds.   Pulmonary:     Effort: Pulmonary effort is normal. No respiratory distress.     Breath sounds: Normal breath sounds.  Abdominal:     General: There is no distension.     Palpations: Abdomen is soft.     Tenderness: There is no abdominal tenderness.  Musculoskeletal:        General: No deformity. Normal range of motion.     Cervical back: Normal range of motion and neck supple.  Skin:    General: Skin is warm and dry.  Neurological:     Mental Status: She is alert and oriented to person, place, and time.     ED Results / Procedures / Treatments   Labs (all labs ordered are listed, but only abnormal results are displayed) Labs Reviewed  GROUP A STREP BY PCR  RESP PANEL BY RT-PCR (FLU A&B, COVID) [id number]    EKG None  Radiology No results found.  Procedures Procedures   Medications Ordered in ED Medications  ibuprofen (ADVIL) tablet 600 mg (has no administration in time range)  acetaminophen (TYLENOL) tablet 1,000 mg (has no administration in time range)    ED Course  I have reviewed the triage vital signs and the nursing notes.  Pertinent labs & imaging results that were available during my care of the patient were reviewed by me and considered in my medical decision making (see chart for details).    MDM Rules/Calculators/A&P                          MDM  MSE complete  Michelle Gibson was evaluated in Emergency Department on 02/07/2021 for the symptoms described in the history of present illness. She was evaluated in the context of the global COVID-19 pandemic, which necessitated consideration that the patient might be at risk for infection with the SARS-CoV-2 virus that causes COVID-19. Institutional protocols and algorithms that pertain to the evaluation of patients at risk for COVID-19 are in a state of rapid change based on information released by regulatory bodies including the CDC and federal and state organizations. These policies and algorithms were  followed during the patient's care in the ED.  Patient is  presenting for evaluation of reported sore throat.  Patient's symptoms are very suggestive of likely viral pharyngitis.  Patient with known sick contacts.  Patient strep test is negative today.  COVID and flu test were negative as well.  Patient is appropriate for further outpatient management.  She understands need for close follow-up with her regular care provider.  Strict return precautions given and understood.  Importance of close follow-up is stressed.   Final Clinical Impression(s) / ED Diagnoses Final diagnoses:  Viral pharyngitis    Rx / DC Orders ED Discharge Orders    None       Valarie Merino, MD 02/07/21 1047

## 2021-02-07 NOTE — Discharge Instructions (Signed)
Please return for any problem.  °

## 2021-02-07 NOTE — ED Triage Notes (Signed)
Pt reports sore throat x 2 days

## 2021-02-12 ENCOUNTER — Encounter: Payer: Self-pay | Admitting: Nurse Practitioner

## 2021-02-14 ENCOUNTER — Encounter: Payer: Self-pay | Admitting: *Deleted

## 2021-02-14 ENCOUNTER — Ambulatory Visit
Admission: EM | Admit: 2021-02-14 | Discharge: 2021-02-14 | Disposition: A | Discharge status: Home / Self Care | Payer: Commercial Managed Care - PPO | Attending: Family Medicine | Admitting: Family Medicine

## 2021-02-14 ENCOUNTER — Other Ambulatory Visit: Payer: Self-pay

## 2021-02-14 ENCOUNTER — Telehealth: Payer: Self-pay | Admitting: *Deleted

## 2021-02-14 ENCOUNTER — Ambulatory Visit (INDEPENDENT_AMBULATORY_CARE_PROVIDER_SITE_OTHER): Payer: Commercial Managed Care - PPO

## 2021-02-14 DIAGNOSIS — R062 Wheezing: Secondary | ICD-10-CM | POA: Diagnosis not present

## 2021-02-14 DIAGNOSIS — R053 Chronic cough: Secondary | ICD-10-CM | POA: Diagnosis not present

## 2021-02-14 DIAGNOSIS — R0602 Shortness of breath: Secondary | ICD-10-CM

## 2021-02-14 MED ORDER — HYDROCOD POLST-CPM POLST ER 10-8 MG/5ML PO SUER: 5.0000 mL | Freq: Every evening | ORAL | 0 refills | Status: DC | PRN

## 2021-02-14 MED ORDER — PREDNISONE 20 MG PO TABS: 40.0000 mg | ORAL_TABLET | Freq: Every day | ORAL | 0 refills | Status: DC

## 2021-02-14 MED ORDER — AZITHROMYCIN 250 MG PO TABS: 250.0000 mg | ORAL_TABLET | Freq: Every day | ORAL | 0 refills | Status: DC

## 2021-02-14 NOTE — Telephone Encounter (Signed)
Cancelled Rx for chlorpheniramine-hydrocodone syrup with pharmacist; as Rx was sent to alternative pharmacy per pt request.

## 2021-02-14 NOTE — ED Provider Notes (Signed)
[facility]   [id number] 02/14/21 Arrival Time: 0973  ASSESSMENT & PLAN:  1. Persistent dry cough   2. Wheezing    I have personally viewed the imaging studies ordered this visit. No acute changes; no infiltrates.  Given duration of symptoms, begin: Meds ordered this encounter  Medications  . chlorpheniramine-HYDROcodone (TUSSIONEX PENNKINETIC ER) 10-8 MG/5ML SUER    Sig: Take 5 mLs by mouth at bedtime as needed for cough.    Dispense:  90 mL    Refill:  0  . azithromycin (ZITHROMAX) 250 MG tablet    Sig: Take 1 tablet (250 mg total) by mouth daily. Take first 2 tablets together, then 1 every day until finished.    Dispense:  6 tablet    Refill:  0  . predniSONE (DELTASONE) 20 MG tablet    Sig: Take 2 tablets (40 mg total) by mouth daily.    Dispense:  10 tablet    Refill:  0     Discharge Instructions     Be aware, your cough medication may cause drowsiness. Please do not drive, operate heavy machinery or make important decisions while on this medication, it can cloud your judgement.  Your chest x-ray looked normal.    OTC symptom care as needed. Ensure adequate fluid intake and rest.   Follow-up Information    Gildardo Pounds, NP.   Specialty: Nurse Practitioner Why: If worsening or failing to improve as anticipated. Contact information: [place] [address] [phone]               Reviewed expectations re: course of current medical issues. Questions answered. Outlined signs and symptoms indicating need for more acute intervention. Patient verbalized understanding. After Visit Summary given.   SUBJECTIVE: History from: patient.  Michelle Gibson is a 45 y.o. female who presents with complaint of nasal congestion, post-nasal drainage, and a persistent dry cough; with sore throat. Over past 2-3 weeks; cough persisting and significant; affecting sleep. Reports SOB/wheezing at times, worse with exertion. No fever  suspected. Recent COVID test negative. No specific or significant aggravating or alleviating factors reported. Normal PO intake.  Social History   Tobacco Use  Smoking Status Former Smoker  . Packs/day: 0.10  . Types: Cigarettes  Smokeless Tobacco Never Used    OBJECTIVE:  Vitals:   02/14/21 0824  BP: 130/81  Pulse: 78  Resp: (!) 22  Temp: 98.5 F (36.9 C)  TempSrc: Oral  SpO2: 95%     General appearance: alert; appears fatigued HEENT: nasal congestion Neck: supple without LAD Lungs: unlabored respirations, symmetrical air entry with wheezing; cough: active and frequent Ext: no LE edema Skin: warm and dry Psychological: alert and cooperative; normal mood and affect  Recheck RR: 18  Imaging: DG Chest 2 View  Result Date: 02/14/2021 CLINICAL DATA:  Short of breath EXAM: CHEST - 2 VIEW COMPARISON:  Prior chest x-ray 12/16/2018 FINDINGS: Stable cardiac and mediastinal contours. Patient is status post median sternotomy. The small size of the sternotomy wires suggests that she underwent cardiac surgery as a child. The lungs are clear. No pleural effusion or pneumothorax. No acute osseous abnormality. IMPRESSION: No active cardiopulmonary disease. Electronically Signed   By: Jacqulynn Cadet M.D.   On: 02/14/2021 09:04    Allergies  Allergen Reactions  . Ciprofloxacin Hives  . Lyrica Cr [Pregabalin Er] Other (See Comments)    Suicidal ideation  . Flecainide Hives, Nausea And Vomiting and Rash  . Penicillin G  Rash    Has patient had a PCN reaction causing immediate rash, facial/tongue/throat swelling, SOB or lightheadedness with hypotension: yes Has patient had a PCN reaction causing severe rash involving mucus membranes or skin necrosis: yes Has patient had a PCN reaction that required hospitalization: no Has patient had a PCN reaction occurring within the last 10 years: no If all of the above answers are "NO", then may proceed with Cephalosporin use.     Past  Medical History:  Diagnosis Date  . Acute tear lateral meniscus, right, initial encounter 08/29/2019  . Asthma   . Closed fracture of upper end of fibula 09/21/2018  . Diabetes mellitus without complication ([place])    TYPE II  . Hyperglycemia 09/15/2016  . Migraines   . WPW (Wolff-Parkinson-White syndrome)    s/p ablation at age 75   Family History  Problem Relation Age of Onset  . Hypertension Mother   . CAD Mother 46  . Diabetes Mother   . Hypertension Father   . CAD Father 63       CABG   Social History   Socioeconomic History  . Marital status: Divorced    Spouse name: Not on file  . Number of children: Not on file  . Years of education: Not on file  . Highest education level: Not on file  Occupational History  . Not on file  Tobacco Use  . Smoking status: Former Smoker    Packs/day: 0.10    Types: Cigarettes  . Smokeless tobacco: Never Used  Vaping Use  . Vaping Use: Never used  Substance and Sexual Activity  . Alcohol use: No  . Drug use: No  . Sexual activity: Not on file  Other Topics Concern  . Not on file  Social History Narrative  . Not on file   Social Determinants of Health   Financial Resource Strain: Not on file  Food Insecurity: Not on file  Transportation Needs: Not on file  Physical Activity: Not on file  Stress: Not on file  Social Connections: Not on file  Intimate Partner Violence: Not on file           Vanessa Kick, MD 02/14/21 [phone]

## 2021-02-14 NOTE — Discharge Instructions (Addendum)
Be aware, your cough medication may cause drowsiness. Please do not drive, operate heavy machinery or make important decisions while on this medication, it can cloud your judgement.  Your chest x-ray looked normal.

## 2021-02-14 NOTE — ED Triage Notes (Addendum)
C/O cough, runny nose, HA x 2 wks without fevers.  States feels like she's getting worse.  Has been doing neb treatments "but I think my med is too old".  SaO2 ranging from 87-95% RA during assessment. C/O some SOB.  Reports negative Covid test x 2 wks.

## 2021-03-09 ENCOUNTER — Emergency Department (HOSPITAL_COMMUNITY): Payer: Commercial Managed Care - PPO

## 2021-03-09 ENCOUNTER — Encounter (HOSPITAL_COMMUNITY): Payer: Self-pay | Admitting: Emergency Medicine

## 2021-03-09 ENCOUNTER — Emergency Department (HOSPITAL_COMMUNITY)
Admission: EM | Admit: 2021-03-09 | Discharge: 2021-03-09 | Disposition: A | Discharge status: Home / Self Care | Payer: Commercial Managed Care - PPO | Attending: Emergency Medicine | Admitting: Emergency Medicine

## 2021-03-09 ENCOUNTER — Other Ambulatory Visit: Payer: Self-pay

## 2021-03-09 DIAGNOSIS — Z794 Long term (current) use of insulin: Secondary | ICD-10-CM | POA: Diagnosis not present

## 2021-03-09 DIAGNOSIS — Z7952 Long term (current) use of systemic steroids: Secondary | ICD-10-CM | POA: Diagnosis not present

## 2021-03-09 DIAGNOSIS — R0602 Shortness of breath: Secondary | ICD-10-CM

## 2021-03-09 DIAGNOSIS — Z87891 Personal history of nicotine dependence: Secondary | ICD-10-CM | POA: Insufficient documentation

## 2021-03-09 DIAGNOSIS — J45909 Unspecified asthma, uncomplicated: Secondary | ICD-10-CM | POA: Diagnosis not present

## 2021-03-09 DIAGNOSIS — R000 Tachycardia, unspecified: Secondary | ICD-10-CM | POA: Insufficient documentation

## 2021-03-09 DIAGNOSIS — Z91040 Latex allergy status: Secondary | ICD-10-CM | POA: Diagnosis not present

## 2021-03-09 DIAGNOSIS — U071 COVID-19: Secondary | ICD-10-CM | POA: Diagnosis not present

## 2021-03-09 DIAGNOSIS — Z7984 Long term (current) use of oral hypoglycemic drugs: Secondary | ICD-10-CM | POA: Insufficient documentation

## 2021-03-09 LAB — CBC WITH DIFFERENTIAL/PLATELET
Abs Immature Granulocytes: 0.02 10*3/uL (ref 0.00–0.07)
Basophils Absolute: 0 10*3/uL (ref 0.0–0.1)
Basophils Relative: 1 %
Eosinophils Absolute: 0.1 10*3/uL (ref 0.0–0.5)
Eosinophils Relative: 3 %
HCT: 36.5 % (ref 36.0–46.0)
Hemoglobin: 12.4 g/dL (ref 12.0–15.0)
Immature Granulocytes: 1 %
Lymphocytes Relative: 31 %
Lymphs Abs: 1.3 10*3/uL (ref 0.7–4.0)
MCH: 30.8 pg (ref 26.0–34.0)
MCHC: 34 g/dL (ref 30.0–36.0)
MCV: 90.6 fL (ref 80.0–100.0)
Monocytes Absolute: 0.2 10*3/uL (ref 0.1–1.0)
Monocytes Relative: 6 %
Neutro Abs: 2.5 10*3/uL (ref 1.7–7.7)
Neutrophils Relative %: 58 %
Platelets: 98 10*3/uL — ABNORMAL LOW (ref 150–400)
RBC: 4.03 MIL/uL (ref 3.87–5.11)
RDW: 13.1 % (ref 11.5–15.5)
WBC: 4.2 10*3/uL (ref 4.0–10.5)
nRBC: 0 % (ref 0.0–0.2)

## 2021-03-09 LAB — URINALYSIS, ROUTINE W REFLEX MICROSCOPIC
Bacteria, UA: NONE SEEN
Bilirubin Urine: NEGATIVE
Glucose, UA: >=500 mg/dL — AB
Hgb urine dipstick: NEGATIVE
Ketones, ur: NEGATIVE mg/dL
Leukocytes,Ua: NEGATIVE
Nitrite: NEGATIVE
Protein, ur: NEGATIVE mg/dL
Specific Gravity, Urine: 1.022 (ref 1.005–1.030)
pH: 5 (ref 5.0–8.0)

## 2021-03-09 LAB — COMPREHENSIVE METABOLIC PANEL
ALT: 17 U/L (ref 0–44)
AST: 37 U/L (ref 15–41)
Albumin: 4.1 g/dL (ref 3.5–5.0)
Alkaline Phosphatase: 95 U/L (ref 38–126)
Anion gap: 8 (ref 5–15)
BUN: 13 mg/dL (ref 6–20)
CO2: 25 mmol/L (ref 22–32)
Calcium: 9 mg/dL (ref 8.9–10.3)
Chloride: 101 mmol/L (ref 98–111)
Creatinine, Ser: 1.03 mg/dL — ABNORMAL HIGH (ref 0.44–1.00)
GFR, Estimated: >60 mL/min (ref >60)
Glucose, Bld: 338 mg/dL — ABNORMAL HIGH (ref 70–99)
Potassium: 4.5 mmol/L (ref 3.5–5.1)
Sodium: 134 mmol/L — ABNORMAL LOW (ref 135–145)
Total Bilirubin: 0.9 mg/dL (ref 0.3–1.2)
Total Protein: 7.8 g/dL (ref 6.5–8.1)

## 2021-03-09 LAB — BLOOD GAS, VENOUS
Acid-Base Excess: 0.4 mmol/L (ref 0.0–2.0)
Bicarbonate: 24.7 mmol/L (ref 20.0–28.0)
O2 Saturation: 87.7 %
Patient temperature: 98.6
pCO2, Ven: 40.7 mmHg — ABNORMAL LOW (ref 44.0–60.0)
pH, Ven: 7.4 (ref 7.250–7.430)
pO2, Ven: 52.4 mmHg — ABNORMAL HIGH (ref 32.0–45.0)

## 2021-03-09 LAB — TRIGLYCERIDES: Triglycerides: 494 mg/dL — ABNORMAL HIGH (ref <150)

## 2021-03-09 LAB — RESP PANEL BY RT-PCR (FLU A&B, COVID) ARPGX2
Influenza A by PCR: NEGATIVE
Influenza B by PCR: NEGATIVE
SARS Coronavirus 2 by RT PCR: POSITIVE — AB

## 2021-03-09 LAB — FIBRINOGEN: Fibrinogen: 373 mg/dL (ref 210–475)

## 2021-03-09 LAB — C-REACTIVE PROTEIN: CRP: 3 mg/dL — ABNORMAL HIGH (ref <1.0)

## 2021-03-09 LAB — FERRITIN: Ferritin: 54 ng/mL (ref 11–307)

## 2021-03-09 LAB — PROCALCITONIN: Procalcitonin: <0.1 ng/mL

## 2021-03-09 LAB — LACTIC ACID, PLASMA
Lactic Acid, Venous: 2.2 mmol/L (ref 0.5–1.9)
Lactic Acid, Venous: 1.8 mmol/L (ref 0.5–1.9)

## 2021-03-09 LAB — LACTATE DEHYDROGENASE: LDH: 169 U/L (ref 98–192)

## 2021-03-09 LAB — D-DIMER, QUANTITATIVE: D-Dimer, Quant: 0.32 ug/mL-FEU (ref 0.00–0.50)

## 2021-03-09 LAB — CBG MONITORING, ED: Glucose-Capillary: 391 mg/dL — ABNORMAL HIGH (ref 70–99)

## 2021-03-09 LAB — HCG, QUANTITATIVE, PREGNANCY: hCG, Beta Chain, Quant, S: 1 m[IU]/mL (ref <5)

## 2021-03-09 MED ORDER — NIRMATRELVIR/RITONAVIR (PAXLOVID)TABLET: 3.0000 | ORAL_TABLET | Freq: Two times a day (BID) | ORAL | 0 refills | Status: DC

## 2021-03-09 MED ORDER — NIRMATRELVIR/RITONAVIR (PAXLOVID)TABLET: 3.0000 | ORAL_TABLET | Freq: Two times a day (BID) | ORAL | 0 refills | Status: AC

## 2021-03-09 MED ORDER — ACETAMINOPHEN 500 MG PO TABS
1000.0000 mg | ORAL_TABLET | Freq: Once | ORAL | Status: AC
  Administered 2021-03-09: 1000 mg via ORAL
  Filled 2021-03-09: qty 2

## 2021-03-09 MED ORDER — ALBUTEROL SULFATE HFA 108 (90 BASE) MCG/ACT IN AERS
2.0000 | INHALATION_SPRAY | RESPIRATORY_TRACT | Status: DC | PRN
  Administered 2021-03-09: 2 via RESPIRATORY_TRACT
  Filled 2021-03-09: qty 6.7

## 2021-03-09 MED ORDER — LACTATED RINGERS IV BOLUS (SEPSIS)
500.0000 mL | Freq: Once | INTRAVENOUS | Status: AC
  Administered 2021-03-09: 500 mL via INTRAVENOUS

## 2021-03-09 NOTE — ED Provider Notes (Signed)
[facility] DEPT Provider Note   CSN: [id number] Arrival date & time: 03/09/21  1702     History Chief Complaint  Patient presents with  . Shortness of Breath  . Cough    Michelle Gibson is a 45 y.o. female.  HPI 45 year old female with a history of asthma, DM type II, Wolff-Parkinson-White presents to the ER with complaints of shortness of breath and chest tightness.  Patient states that her sister and another family member currently have [place].  Her sister is in the ICU with COVID.  She started to develop symptoms last night with a fever of 102 and body aches.  She is vaccinated and boosted for COVID-19.  She denies any hemoptysis.  No leg swelling.  Endorses pain in her chest with coughing.  Domino pain, nausea, vomiting, diarrhea    Past Medical History:  Diagnosis Date  . Acute tear lateral meniscus, right, initial encounter 08/29/2019  . Asthma   . Closed fracture of upper end of fibula 09/21/2018  . Diabetes mellitus without complication ([place])    TYPE II  . Hyperglycemia 09/15/2016  . Migraines   . WPW (Wolff-Parkinson-White syndrome)    s/p ablation at age 65    Patient Active Problem List   Diagnosis Date Noted  . Complete tear of right ACL, initial encounter 12/10/2020  . Acute tear lateral meniscus, right, initial encounter 08/29/2019  . Acute medial meniscus tear of right knee 08/22/2019  . Fever and chills 11/17/2018  . Closed fracture of upper end of fibula 09/21/2018  . WPW (Wolff-Parkinson-White syndrome) 10/10/2017  . Asthma with acute exacerbation 09/15/2016  . Hyperglycemia 09/15/2016  . History of Wolff-Parkinson-White (WPW) syndrome 09/15/2016  . Acute respiratory failure with hypoxia ([place]) 09/15/2016  . Bronchospasm 09/11/2016    Past Surgical History:  Procedure Laterality Date  . ABDOMINAL HYSTERECTOMY    . CARDIAC SURGERY    . CESAREAN SECTION    . KNEE ARTHROSCOPY WITH LATERAL MENISECTOMY Right 08/29/2019    Procedure: PARTIAL LATERAL MENISECTOMY;  Surgeon: Leandrew Koyanagi, MD;  Location: [facility];  Service: Orthopedics;  Laterality: Right;  . KNEE ARTHROSCOPY WITH MEDIAL MENISECTOMY Right 08/29/2019   Procedure: RIGHT KNEE ARTHROSCOPY WITH PARTIAL MEDIAL MENISECTOMY;  Surgeon: Leandrew Koyanagi, MD;  Location: [facility];  Service: Orthopedics;  Laterality: Right;     OB History   No obstetric history on file.     Family History  Problem Relation Age of Onset  . Hypertension Mother   . CAD Mother 20  . Diabetes Mother   . Hypertension Father   . CAD Father 27       CABG    Social History   Tobacco Use  . Smoking status: Former Smoker    Packs/day: 0.10    Types: Cigarettes  . Smokeless tobacco: Never Used  Vaping Use  . Vaping Use: Never used  Substance Use Topics  . Alcohol use: No  . Drug use: No    Home Medications Prior to Admission medications   Medication Sig Start Date End Date Taking? Authorizing Provider  atorvastatin (LIPITOR) 20 MG tablet TAKE 1 TABLET (20 MG TOTAL) BY MOUTH DAILY. Patient not taking: Reported on 03/09/2021 12/09/20 12/09/21  Gildardo Pounds, NP  azithromycin (ZITHROMAX) 250 MG tablet Take 1 tablet (250 mg total) by mouth daily. Take first 2 tablets together, then 1 every day until finished. 02/14/21   Vanessa Kick, MD  Blood Glucose Monitoring Suppl (  ONETOUCH VERIO REFLECT) w/Device KIT USE TO CHECK BLOOD SUGAR 3 TIMES DAILY 12/10/20 12/10/21  Charlott Rakes, MD  Blood Glucose Monitoring Suppl (ONETOUCH VERIO) w/Device KIT Use to check blood sugar TID. 12/10/20   Charlott Rakes, MD  cetirizine (ZYRTEC) 10 MG tablet TAKE 1 CAPSULE (10 MG TOTAL) BY MOUTH DAILY. 12/29/20 12/29/21  Scot Jun, FNP  Cetirizine HCl 10 MG CAPS Take 1 capsule (10 mg total) by mouth daily. 12/29/20 01/28/21  Scot Jun, FNP  chlorpheniramine-HYDROcodone (TUSSIONEX PENNKINETIC ER) 10-8 MG/5ML SUER Take 5 mLs by mouth at bedtime as needed for  cough. 02/14/21   Vanessa Kick, MD  cyclobenzaprine (FLEXERIL) 5 MG tablet TAKE 1 TABLET (5 MG TOTAL) BY MOUTH 2 (TWO) TIMES DAILY AS NEEDED FOR MUSCLE SPASMS. 10/29/20 10/29/21  Gildardo Pounds, NP  empagliflozin (JARDIANCE) 10 MG TABS tablet TAKE 1 TABLET (10 MG TOTAL) BY MOUTH DAILY BEFORE BREAKFAST. 12/29/20 12/29/21  Gildardo Pounds, NP  fluticasone (FLONASE) 50 MCG/ACT nasal spray PLACE 2 SPRAYS INTO BOTH NOSTRILS DAILY. 12/29/20 12/29/21  Scot Jun, FNP  gabapentin (NEURONTIN) 300 MG capsule TAKE 2 CAPSULES (600 MG TOTAL) BY MOUTH 3 (THREE) TIMES DAILY. 12/09/20 12/09/21  Gildardo Pounds, NP  glucose blood test strip USE TO CHECK BLOOD SUGAR 3 TIMES DAILY Patient taking differently: USE TO CHECK BLOOD SUGAR 3 TIMES DAILY 12/10/20 12/10/21  Charlott Rakes, MD  insulin glargine (SEMGLEE) 100 UNIT/ML Solostar Pen Inject 20 Units into the skin daily. 01/02/21   Charlott Rakes, MD  Insulin Glargine-yfgn 100 UNIT/ML SOPN INJECT 20 UNITS INTO THE SKIN DAILY. 01/02/21 01/02/22  Charlott Rakes, MD  Insulin Pen Needle 31G X 5 MM MISC USE AS INSTRUCTED. CHECK BLOOD GLUCOSE LEVEL BY FINGERSTICK TWICE PER DAY. 12/09/20 12/09/21  Gildardo Pounds, NP  Lancets (ONETOUCH DELICA PLUS [id number]) MISC USE TO CHECK BLOOD SUGAR 3 TIMES DAILY 12/10/20 12/10/21  Charlott Rakes, MD  metFORMIN (GLUCOPHAGE) 500 MG tablet Take 1 tablet (500 mg total) by mouth 2 (two) times daily with a meal. 12/09/20 03/09/21  Gildardo Pounds, NP  metFORMIN (GLUCOPHAGE) 500 MG tablet TAKE 1 TABLET (500 MG TOTAL) BY MOUTH 2 (TWO) TIMES DAILY WITH A MEAL. 12/10/20 12/10/21  Gildardo Pounds, NP  nirmatrelvir/ritonavir EUA (PAXLOVID) TABS Take 3 tablets by mouth 2 (two) times daily for 5 days. Patient GFR is 60. Take nirmatrelvir (150 mg) two tablets twice daily for 5 days and ritonavir (100 mg) one tablet twice daily for 5 days. 03/09/21 03/14/21  Garald Balding, PA-C  OneTouch Delica Lancets 60F MISC Use to check blood sugar TID. 12/10/20   Charlott Rakes,  MD  predniSONE (DELTASONE) 20 MG tablet Take 2 tablets (40 mg total) by mouth daily. 02/14/21   Vanessa Kick, MD  pregabalin (LYRICA) 50 MG capsule TAKE 1 CAPSULE (50 MG TOTAL) BY MOUTH 3 (THREE) TIMES DAILY. 12/09/20 06/09/21  Gildardo Pounds, NP  pseudoephedrine (SUDAFED) 30 MG tablet TAKE 1 TABLET (30 MG TOTAL) BY MOUTH EVERY 4 (FOUR) HOURS AS NEEDED FOR CONGESTION. 12/29/20 12/29/21  Scot Jun, FNP  sertraline (ZOLOFT) 100 MG tablet TAKE 1 TABLET (100 MG TOTAL) BY MOUTH DAILY. 12/09/20 12/09/21  Gildardo Pounds, NP  tamsulosin (FLOMAX) 0.4 MG CAPS capsule TAKE 1 CAPSULE (0.4 MG TOTAL) BY MOUTH DAILY. 10/29/20 10/29/21  Gildardo Pounds, NP  topiramate (TOPAMAX) 50 MG tablet TAKE 1 TABLET (50 MG TOTAL) BY MOUTH DAILY. 12/09/20 12/09/21  Gildardo Pounds, NP  metoCLOPramide [facility])  10 MG tablet Take 1 tablet (10 mg total) by mouth every 8 (eight) hours as needed for nausea or vomiting (headache). Patient not taking: Reported on 07/14/2020 07/07/20 07/17/20  Petrucelli, Glynda Jaeger, PA-C  traZODone (DESYREL) 50 MG tablet Take 0.5-1 tablets (25-50 mg total) by mouth at bedtime as needed for sleep. 06/04/19 07/13/19  Fulp, Ander Gaster, MD    Allergies    Ciprofloxacin, Lyrica cr [pregabalin er], Flecainide, Latex, and Penicillin g  Review of Systems   Review of Systems  Constitutional: Positive for activity change, appetite change, fatigue and fever. Negative for chills.  HENT: Negative for ear pain and sore throat.   Eyes: Negative for pain and visual disturbance.  Respiratory: Positive for cough and shortness of breath.   Cardiovascular: Negative for chest pain and palpitations.  Gastrointestinal: Negative for abdominal pain, diarrhea, nausea and vomiting.  Genitourinary: Negative for dysuria and hematuria.  Musculoskeletal: Negative for arthralgias and back pain.  Skin: Negative for color change and rash.  Neurological: Positive for weakness. Negative for seizures and syncope.  All other systems  reviewed and are negative.   Physical Exam Updated Vital Signs BP 104/81   Pulse 90   Temp (!) 102.9 F (39.4 C) (Oral)   Resp (!) 21   Ht _0  (1.676 m)   Wt 87.5 kg   SpO2 92%   BMI 31.15 kg/m   Physical Exam Vitals and nursing note reviewed.  Constitutional:      General: She is not in acute distress.    Appearance: She is well-developed.  HENT:     Head: Normocephalic and atraumatic.  Eyes:     Conjunctiva/sclera: Conjunctivae normal.  Cardiovascular:     Rate and Rhythm: Normal rate and regular rhythm.     Heart sounds: No murmur heard.   Pulmonary:     Effort: Pulmonary effort is normal. No respiratory distress.     Breath sounds: No decreased breath sounds, wheezing, rhonchi or rales.  Chest:     Chest wall: No tenderness.  Abdominal:     Palpations: Abdomen is soft.     Tenderness: There is no abdominal tenderness.  Musculoskeletal:     Cervical back: Neck supple.     Right lower leg: No edema.     Left lower leg: No edema.  Skin:    General: Skin is warm and dry.  Neurological:     General: No focal deficit present.     Mental Status: She is alert.  Psychiatric:        Mood and Affect: Mood normal.        Behavior: Behavior normal.     ED Results / Procedures / Treatments   Labs (all labs ordered are listed, but only abnormal results are displayed) Labs Reviewed  RESP PANEL BY RT-PCR (FLU A&B, COVID) ARPGX2 - Abnormal; Notable for the following components:      Result Value   SARS Coronavirus 2 by RT PCR POSITIVE (*)    All other components within normal limits  CBC WITH DIFFERENTIAL/PLATELET - Abnormal; Notable for the following components:   Platelets 98 (*)    All other components within normal limits  COMPREHENSIVE METABOLIC PANEL - Abnormal; Notable for the following components:   Sodium 134 (*)    Glucose, Bld 338 (*)    Creatinine, Ser 1.03 (*)    All other components within normal limits  LACTIC ACID, PLASMA - Abnormal; Notable  for the following components:   Lactic Acid, Venous  2.2 (*)    All other components within normal limits  TRIGLYCERIDES - Abnormal; Notable for the following components:   Triglycerides 494 (*)    All other components within normal limits  C-REACTIVE PROTEIN - Abnormal; Notable for the following components:   CRP 3.0 (*)    All other components within normal limits  URINALYSIS, ROUTINE W REFLEX MICROSCOPIC - Abnormal; Notable for the following components:   Glucose, UA >=500 (*)    All other components within normal limits  BLOOD GAS, VENOUS - Abnormal; Notable for the following components:   pCO2, Ven 40.7 (*)    pO2, Ven 52.4 (*)    All other components within normal limits  CBG MONITORING, ED - Abnormal; Notable for the following components:   Glucose-Capillary 391 (*)    All other components within normal limits  CULTURE, BLOOD (ROUTINE X 2)  CULTURE, BLOOD (ROUTINE X 2)  URINE CULTURE  D-DIMER, QUANTITATIVE  PROCALCITONIN  LACTATE DEHYDROGENASE  FERRITIN  FIBRINOGEN  HCG, QUANTITATIVE, PREGNANCY  LACTIC ACID, PLASMA    EKG EKG Interpretation  Date/Time:  Monday Mar 09 2021 17:15:53 EDT Ventricular Rate:  130 PR Interval:  89 QRS Duration: 78 QT Interval:  395 QTC Calculation: 581 R Axis:   40 Text Interpretation: Sinus tachycardia Abnormal R-wave progression, early transition Nonspecific repol abnormality, diffuse leads Prolonged QT interval Sinus tachycardia Confirmed by Lavenia Atlas [id number]) on 03/09/2021 5:23:40 PM   Radiology DG Chest Port 1 View  Result Date: 03/09/2021 CLINICAL DATA:  45 year old female with fever and shortness of breath. EXAM: PORTABLE CHEST 1 VIEW COMPARISON:  Chest radiograph dated 02/14/2021. FINDINGS: No focal consolidation, pleural effusion, or pneumothorax. The cardiac silhouette is within limits. Median sternotomy wires. No acute osseous pathology. IMPRESSION: No active disease. Electronically Signed   By: Anner Crete M.D.   On:  03/09/2021 19:29    Procedures Procedures   Medications Ordered in ED Medications  albuterol (VENTOLIN HFA) 108 (90 Base) MCG/ACT inhaler 2 puff (2 puffs Inhalation Given 03/09/21 1734)  acetaminophen (TYLENOL) tablet 1,000 mg (1,000 mg Oral Given 03/09/21 1732)  lactated ringers bolus 500 mL (0 mLs Intravenous Stopped 03/09/21 2021)    ED Course  I have reviewed the triage vital signs and the nursing notes.  Pertinent labs & imaging results that were available during my care of the patient were reviewed by me and considered in my medical decision making (see chart for details).    MDM Rules/Calculators/A&P                         45 year old female presents to the ER with cough, shortness of breath, pain in her chest and fevers.  She had a recent COVID exposure.  On arrival, she is anxious appearing, slightly tachypneic, with no wheezes.  Vitals on arrival with a normal blood pressure, tachycardic rate at 137, however this improved to 105 throughout her ED course.  She did have a fever of 102.9.  No evidence of hypoxia.  Patient was ambulated with sats staying above 95%.  Labs and imaging ordered, reviewed and interpreted by me.  CBC unremarkable, CMP with mild hyponatremia, very mild elevation of her creatinine 1.03 likely due to dehydration in the setting of illness.  COVID test is positive today.  Normal VBG.  UA without evidence of UTI.  D-dimer is negative.  Chest x-ray without evidence of pneumonia.  EKG with sinus tach.  Patient received 500 cc of fluids, Tylenol.  Albuterol given.  She was ambulated with no evidence of hypoxia.  Not overly symptomatic with ambulation.  Will prescribe Paxil [place].  Encouraged PCP follow-up.  We discussed return precautions.  Patient instructed to purchase pulse ox, use albuterol inhaler.  She was understanding is agreeable.  Stable for discharge.  Case discussed with Dr. Ronnald Nian who is agreeable to the above plan and disposition  Final Clinical  Impression(s) / ED Diagnoses Final diagnoses:  [id number]    Rx / DC Orders ED Discharge Orders         Ordered    nirmatrelvir/ritonavir EUA (PAXLOVID) TABS  2 times daily,   Status:  Discontinued        03/09/21 2101    nirmatrelvir/ritonavir EUA (PAXLOVID) TABS  2 times daily,   Status:  Discontinued        03/09/21 2101    nirmatrelvir/ritonavir EUA (PAXLOVID) TABS  2 times daily        03/09/21 2102           Garald Balding, PA-C 03/09/21 2103    Lennice Sites, DO 03/09/21 2153

## 2021-03-09 NOTE — ED Notes (Signed)
PA and primary nurse made aware lactic 2.2.

## 2021-03-09 NOTE — ED Notes (Signed)
Ambulated patient with pulse oximetry. Oxygen saturation remained above 97% percent but patients heart rate increased to 130. Sharyn Lull, PA notified.

## 2021-03-09 NOTE — ED Triage Notes (Signed)
Pt states that her family was diagnosed with covid a week ago. Pt states she has shortness of breath and chest pain, as well as a fever of 102 that she took at home. Pt has hx of asthma and diabetes.

## 2021-03-09 NOTE — Discharge Instructions (Signed)
Your COVID-19 test today was positive.  Please quarantine for an additional 7 to 10 days.  You may take ibuprofen/Tylenol for body aches and fevers, drink plenty of fluids, over-the-counter cold and flu medications. You may also buy an over-the-counter pulse oximeter which can be found at your local pharmacy. If your oxygen saturation drops below 90%, please make sure to return to the ER. Return to the ER for any worsening shortness of breath.  Please pick up the Paxil of it which is the treatment for COVID-19 at [facility].  Please make sure to follow-up with your primary care doctor.

## 2021-03-11 ENCOUNTER — Telehealth: Payer: Self-pay | Admitting: Nurse Practitioner

## 2021-03-11 LAB — URINE CULTURE

## 2021-03-11 NOTE — Telephone Encounter (Signed)
Copied from [place] [id number]. Topic: Appointment Scheduling - Scheduling Inquiry for Clinic >> Mar 10, 2021  2:45 PM [place] wrote: Reason for CRM: Called office and has no HFU for pt earlest is 7/7. Pt is asthmatic, diabetic and now covid positive. Med will run out and due to history was told to immediately FU with PCP, Pt states that she was to have her labs looked at for FU Pt did not want to take the 7/7 appt, request FU at [phone]   Patient tested positive 03/09/21. Would it be appropriate to work this patient in at 8:10 or 1:30 or would you prefer patient see mobile. Please advise and I can contact patient to schedule.

## 2021-03-12 ENCOUNTER — Other Ambulatory Visit: Payer: Self-pay

## 2021-03-12 ENCOUNTER — Telehealth (INDEPENDENT_AMBULATORY_CARE_PROVIDER_SITE_OTHER): Payer: Commercial Managed Care - PPO | Admitting: Nurse Practitioner

## 2021-03-12 DIAGNOSIS — U071 COVID-19: Secondary | ICD-10-CM

## 2021-03-12 MED ORDER — FLUCONAZOLE 150 MG PO TABS
150.0000 mg | ORAL_TABLET | Freq: Once | ORAL | 0 refills | Status: DC
  Filled 2021-03-12: qty 1, 1d supply, fill #0

## 2021-03-12 MED ORDER — PREDNISONE 10 MG PO TABS
10.0000 mg | ORAL_TABLET | Freq: Every day | ORAL | 0 refills | Status: AC
Start: 2021-03-12 — End: 2021-03-18
  Filled 2021-03-12: qty 5, 5d supply, fill #0

## 2021-03-12 MED ORDER — DOXYCYCLINE HYCLATE 100 MG PO TABS
100.0000 mg | ORAL_TABLET | Freq: Two times a day (BID) | ORAL | 0 refills | Status: AC
Start: 2021-03-12 — End: 2021-03-23
  Filled 2021-03-12: qty 20, 10d supply, fill #0

## 2021-03-12 NOTE — Telephone Encounter (Signed)
Pt is scheduled a virtual appt with Tonya at [facility] at 4pm on 6/2

## 2021-03-12 NOTE — Progress Notes (Signed)
Virtual Visit via Telephone Note  I connected with [place] on 03/18/21 at  4:00 PM EDT by telephone and verified that I am speaking with the correct person using two identifiers.  Location: Patient: home Provider: office   I discussed the limitations, risks, security and privacy concerns of performing an evaluation and management service by telephone and the availability of in person appointments. I also discussed with the patient that there may be a patient responsible charge related to this service. The patient expressed understanding and agreed to proceed.   History of Present Illness:  Patient presents today for post-COVID care clinic visit.  Patient tested positive for COVID on 03/09/2021.  Symptom onset was 03/08/2021.  Patient complains of body aches, headache, chills, cough.  She denies any significant shortness of breath.  She has been taking Tylenol and ibuprofen.  She was prescribed Paxilovid which she started on 03/09/2021. Denies f/c/s, n/v/d, hemoptysis, PND, chest pain or edema     Observations/Objective:  Vitals with BMI 03/09/2021 03/09/2021 03/09/2021  Height - - -  Weight - - -  BMI - - -  Systolic 161 096 045  Diastolic 81 409 80  Pulse 90 112 105      Assessment and Plan:  Covid 19 Cough:   Stay well hydrated  Stay active  Deep breathing exercises  May take tylenol or fever or pain  May take mucinex  twice daily  Will order doxycycline  Will order   Follow up:  Follow up in 2 weeks or sooner if needed       I discussed the assessment and treatment plan with the patient. The patient was provided an opportunity to ask questions and all were answered. The patient agreed with the plan and demonstrated an understanding of the instructions.   The patient was advised to call back or seek an in-person evaluation if the symptoms worsen or if the condition fails to improve as anticipated.  I provided 23 minutes of non-face-to-face time  during this encounter.   Fenton Foy, NP

## 2021-03-12 NOTE — Patient Instructions (Signed)
Covid 19 Cough:   Stay well hydrated  Stay active  Deep breathing exercises  May take tylenol or fever or pain  May take mucinex  twice daily  Will order doxycycline  Will order   Follow up:  Follow up in 2 weeks or sooner if needed

## 2021-03-13 ENCOUNTER — Other Ambulatory Visit: Payer: Self-pay

## 2021-03-14 LAB — CULTURE, BLOOD (ROUTINE X 2)
Culture: NO GROWTH
Culture: NO GROWTH
Special Requests: ADEQUATE

## 2021-03-18 ENCOUNTER — Telehealth: Payer: Self-pay | Admitting: Nurse Practitioner

## 2021-03-19 ENCOUNTER — Ambulatory Visit (INDEPENDENT_AMBULATORY_CARE_PROVIDER_SITE_OTHER): Payer: Commercial Managed Care - PPO | Admitting: Nurse Practitioner

## 2021-03-19 VITALS — BP 117/88 | HR 112 | Temp 98.1°F | Resp 18

## 2021-03-19 DIAGNOSIS — R000 Tachycardia, unspecified: Secondary | ICD-10-CM

## 2021-03-19 DIAGNOSIS — R059 Cough, unspecified: Secondary | ICD-10-CM | POA: Diagnosis not present

## 2021-03-19 DIAGNOSIS — R002 Palpitations: Secondary | ICD-10-CM

## 2021-03-19 DIAGNOSIS — Z8616 Personal history of COVID-19: Secondary | ICD-10-CM

## 2021-03-19 NOTE — Assessment & Plan Note (Signed)
Cough:   Stay well hydrated  Stay active  Deep breathing exercises  May take tylenol or fever or pain  May take mucinex DM twice daily  Will order chest x ray:  [facility] [address], [place] [address] [phone] MON - FRI 8:00 AM - 4:00 PM - WALK IN   Tachycardia Palpitations:  Will refer to cardiology   Follow up:  Follow up in 2 weeks or sooner if needed

## 2021-03-19 NOTE — Patient Instructions (Addendum)
Covid 19 Cough:   Stay well hydrated  Stay active  Deep breathing exercises  May take tylenol or fever or pain  May take mucinex DM twice daily  Will order chest x ray:  [facility] [address] [place], [place] [address] [phone] MON - FRI 8:00 AM - 4:00 PM - WALK IN   Tachycardia Palpitations:  Will refer to cardiology   Follow up:  Follow up in 2 weeks or sooner if needed

## 2021-03-19 NOTE — Progress Notes (Signed)
'@Patient'  ID: Michelle Gibson, female    DOB: 05-17-1976, 45 y.o.   MRN: [id number]  Chief Complaint  Patient presents with   history of covid     Referring provider: Gildardo Pounds, NP    HPI  Patient presents today for post-COVID care clinic visit follow-up.  At her last visit 2 weeks ago she was prescribed doxycycline and prednisone she has finished paxlovid.  Patient complains today of ongoing tachycardia and palpitations, intermittent pain with inspiration, slight cough.  She states that she is eating well and staying well-hydrated.  She states that her urine output may be decreased. Denies f/c/s, n/v/d, hemoptysis, PND, chest pain or edema.       Allergies  Allergen Reactions   Ciprofloxacin Hives   Lyrica Cr [Pregabalin Er] Other (See Comments)    Suicidal ideation   Flecainide Hives, Nausea And Vomiting and Rash   Latex Rash and Other (See Comments)    Causes blisters, also   Penicillin G Rash    Has patient had a PCN reaction causing immediate rash, facial/tongue/throat swelling, SOB or lightheadedness with hypotension: yes Has patient had a PCN reaction causing severe rash involving mucus membranes or skin necrosis: yes Has patient had a PCN reaction that required hospitalization: no Has patient had a PCN reaction occurring within the last 10 years: no If all of the above answers are "NO", then may proceed with Cephalosporin use.     Immunization History  Administered Date(s) Administered   Influenza,inj,Quad PF,6+ Mos 09/16/2016, 07/20/2018, 07/05/2019   PFIZER(Purple Top)SARS-COV-2 Vaccination 01/10/2020, 02/05/2020   Tdap 08/23/2018    Past Medical History:  Diagnosis Date   Acute tear lateral meniscus, right, initial encounter 08/29/2019   Asthma    Closed fracture of upper end of fibula 09/21/2018   Diabetes mellitus without complication ([place])    TYPE II   Hyperglycemia 09/15/2016   Migraines    WPW (Wolff-Parkinson-White syndrome)    s/p ablation  at age 41    Tobacco History: Social History   Tobacco Use  Smoking Status Former   Packs/day: 0.10   Pack years: 0.00   Types: Cigarettes  Smokeless Tobacco Never   Counseling given: Yes   Outpatient Encounter Medications as of 03/19/2021  Medication Sig   atorvastatin (LIPITOR) 20 MG tablet TAKE 1 TABLET (20 MG TOTAL) BY MOUTH DAILY. (Patient not taking: Reported on 03/09/2021)   azithromycin (ZITHROMAX) 250 MG tablet Take 1 tablet (250 mg total) by mouth daily. Take first 2 tablets together, then 1 every day until finished.   Blood Glucose Monitoring Suppl (ONETOUCH VERIO REFLECT) w/Device KIT USE TO CHECK BLOOD SUGAR 3 TIMES DAILY   Blood Glucose Monitoring Suppl (ONETOUCH VERIO) w/Device KIT Use to check blood sugar TID.   cetirizine (ZYRTEC) 10 MG tablet TAKE 1 CAPSULE (10 MG TOTAL) BY MOUTH DAILY.   Cetirizine HCl 10 MG CAPS Take 1 capsule (10 mg total) by mouth daily.   chlorpheniramine-HYDROcodone (TUSSIONEX PENNKINETIC ER) 10-8 MG/5ML SUER Take 5 mLs by mouth at bedtime as needed for cough.   cyclobenzaprine (FLEXERIL) 5 MG tablet TAKE 1 TABLET (5 MG TOTAL) BY MOUTH 2 (TWO) TIMES DAILY AS NEEDED FOR MUSCLE SPASMS.   doxycycline (VIBRA-TABS) 100 MG tablet Take 1 tablet (100 mg total) by mouth 2 (two) times daily for 10 days.   empagliflozin (JARDIANCE) 10 MG TABS tablet TAKE 1 TABLET (10 MG TOTAL) BY MOUTH DAILY BEFORE BREAKFAST.   fluticasone (FLONASE) 50 MCG/ACT nasal spray PLACE 2  SPRAYS INTO BOTH NOSTRILS DAILY.   gabapentin (NEURONTIN) 300 MG capsule TAKE 2 CAPSULES (600 MG TOTAL) BY MOUTH 3 (THREE) TIMES DAILY.   glucose blood test strip USE TO CHECK BLOOD SUGAR 3 TIMES DAILY (Patient taking differently: USE TO CHECK BLOOD SUGAR 3 TIMES DAILY)   insulin glargine (SEMGLEE) 100 UNIT/ML Solostar Pen Inject 20 Units into the skin daily.   Insulin Glargine-yfgn 100 UNIT/ML SOPN INJECT 20 UNITS INTO THE SKIN DAILY.   Insulin Pen Needle 31G X 5 MM MISC USE AS INSTRUCTED. CHECK  BLOOD GLUCOSE LEVEL BY FINGERSTICK TWICE PER DAY.   Lancets (ONETOUCH DELICA PLUS [id number]) MISC USE TO CHECK BLOOD SUGAR 3 TIMES DAILY   metFORMIN (GLUCOPHAGE) 500 MG tablet Take 1 tablet (500 mg total) by mouth 2 (two) times daily with a meal.   metFORMIN (GLUCOPHAGE) 500 MG tablet TAKE 1 TABLET (500 MG TOTAL) BY MOUTH 2 (TWO) TIMES DAILY WITH A MEAL.   OneTouch Delica Lancets 36R MISC Use to check blood sugar TID.   pregabalin (LYRICA) 50 MG capsule TAKE 1 CAPSULE (50 MG TOTAL) BY MOUTH 3 (THREE) TIMES DAILY.   pseudoephedrine (SUDAFED) 30 MG tablet TAKE 1 TABLET (30 MG TOTAL) BY MOUTH EVERY 4 (FOUR) HOURS AS NEEDED FOR CONGESTION.   sertraline (ZOLOFT) 100 MG tablet TAKE 1 TABLET (100 MG TOTAL) BY MOUTH DAILY.   tamsulosin (FLOMAX) 0.4 MG CAPS capsule TAKE 1 CAPSULE (0.4 MG TOTAL) BY MOUTH DAILY.   topiramate (TOPAMAX) 50 MG tablet TAKE 1 TABLET (50 MG TOTAL) BY MOUTH DAILY.   [DISCONTINUED] metoCLOPramide (REGLAN) 10 MG tablet Take 1 tablet (10 mg total) by mouth every 8 (eight) hours as needed for nausea or vomiting (headache). (Patient not taking: Reported on 07/14/2020)   [DISCONTINUED] traZODone (DESYREL) 50 MG tablet Take 0.5-1 tablets (25-50 mg total) by mouth at bedtime as needed for sleep.   Facility-Administered Encounter Medications as of 03/19/2021  Medication   ipratropium-albuterol (DUONEB) 0.5-2.5 (3) MG/3ML nebulizer solution 3 mL     Review of Systems  Review of Systems  Constitutional: Negative.  Negative for fatigue and fever.  HENT: Negative.    Respiratory:  Negative for cough and shortness of breath.   Cardiovascular:  Positive for palpitations.  Gastrointestinal: Negative.   Allergic/Immunologic: Negative.   Neurological: Negative.   Psychiatric/Behavioral: Negative.        Physical Exam  BP 117/88   Pulse (!) 112   Temp 98.1 F (36.7 C)   Resp 18   SpO2 98%   Wt Readings from Last 5 Encounters:  03/09/21 193 lb (87.5 kg)  02/07/21 180 lb (81.6 kg)   10/06/20 178 lb 9.2 oz (81 kg)  07/14/20 171 lb (77.6 kg)  07/12/20 173 lb (78.5 kg)     Physical Exam Vitals and nursing note reviewed.  Constitutional:      General: She is not in acute distress.    Appearance: She is well-developed.  Cardiovascular:     Rate and Rhythm: Regular rhythm. Tachycardia present.  Pulmonary:     Effort: Pulmonary effort is normal.     Breath sounds: Normal breath sounds.  Neurological:     Mental Status: She is alert and oriented to person, place, and time.     Lab Results:  CBC    Component Value Date/Time   WBC 4.2 03/09/2021 1724   RBC 4.03 03/09/2021 1724   HGB 12.4 03/09/2021 1724   HGB 15.2 11/16/2019 1710   HCT 36.5 03/09/2021 1724  HCT 45.0 11/16/2019 1710   PLT 98 (L) 03/09/2021 1724   PLT 463 (H) 11/16/2019 1710   MCV 90.6 03/09/2021 1724   MCV 85 11/16/2019 1710   MCH 30.8 03/09/2021 1724   MCHC 34.0 03/09/2021 1724   RDW 13.1 03/09/2021 1724   RDW 13.0 11/16/2019 1710   LYMPHSABS 1.3 03/09/2021 1724   MONOABS 0.2 03/09/2021 1724   EOSABS 0.1 03/09/2021 1724   BASOSABS 0.0 03/09/2021 1724    BMET    Component Value Date/Time   NA 134 (L) 03/09/2021 1724   NA 144 06/24/2020 1215   K 4.5 03/09/2021 1724   CL 101 03/09/2021 1724   CO2 25 03/09/2021 1724   GLUCOSE 338 (H) 03/09/2021 1724   BUN 13 03/09/2021 1724   BUN 13 06/24/2020 1215   CREATININE 1.03 (H) 03/09/2021 1724   CREATININE 1.00 10/07/2016 1506   CALCIUM 9.0 03/09/2021 1724   GFRNONAA >60 03/09/2021 1724   GFRAA >60 07/12/2020 1046    BNP No results found for: BNP  ProBNP No results found for: PROBNP  Imaging: DG Chest Port 1 View  Result Date: 03/09/2021 CLINICAL DATA:  45 year old female with fever and shortness of breath. EXAM: PORTABLE CHEST 1 VIEW COMPARISON:  Chest radiograph dated 02/14/2021. FINDINGS: No focal consolidation, pleural effusion, or pneumothorax. The cardiac silhouette is within limits. Median sternotomy wires. No acute  osseous pathology. IMPRESSION: No active disease. Electronically Signed   By: Anner Crete M.D.   On: 03/09/2021 19:29     Assessment & Plan:   History of COVID-19 Cough:   Stay well hydrated  Stay active  Deep breathing exercises  May take tylenol or fever or pain  May take mucinex DM twice daily  Will order chest x ray:  [facility] [address], [place] [address] [phone] MON - FRI 8:00 AM - 4:00 PM - WALK IN   Tachycardia Palpitations:  Will refer to cardiology   Follow up:  Follow up in 2 weeks or sooner if needed     Fenton Foy, NP 03/19/2021

## 2021-03-20 ENCOUNTER — Telehealth: Payer: Self-pay | Admitting: Cardiology

## 2021-03-20 DIAGNOSIS — R000 Tachycardia, unspecified: Secondary | ICD-10-CM

## 2021-03-20 DIAGNOSIS — R002 Palpitations: Secondary | ICD-10-CM

## 2021-03-20 NOTE — Telephone Encounter (Signed)
Spoke with the patient who states that she has been having palpitations ever since she got COVID on 5/30. She states that her heart rate has been in the 120s-130s. She denies any shortness of breath, dizziness, or chest pain. She does not feel like she is going to pass out. She states that she is just very fatigued and has a headache. Last EKG 5/31 showed sinus tachycardia.

## 2021-03-20 NOTE — Telephone Encounter (Signed)
Patient c/o Palpitations:  High priority if patient c/o lightheadedness, shortness of breath, or chest pain  How long have you had palpitations/irregular HR/ Afib? Are you having the symptoms now? Since May 30th, when she tested positive for Covid  Are you currently experiencing lightheadedness, SOB or CP? no  Do you have a history of afib (atrial fibrillation) or irregular heart rhythm? Palpitations and tachycardia  Have you checked your BP or HR? (document readings if available): Does not know right now but has been running in the 130's  Are you experiencing any other symptoms? Awful headaches, severely tired, states it feels like she has been running a marathon everyday.

## 2021-03-23 ENCOUNTER — Ambulatory Visit (INDEPENDENT_AMBULATORY_CARE_PROVIDER_SITE_OTHER): Payer: Commercial Managed Care - PPO

## 2021-03-23 DIAGNOSIS — R000 Tachycardia, unspecified: Secondary | ICD-10-CM

## 2021-03-23 DIAGNOSIS — R002 Palpitations: Secondary | ICD-10-CM

## 2021-03-23 NOTE — Telephone Encounter (Signed)
Left message for patient advising her that heart monitor has been ordered. Advised to call back with any questions.

## 2021-03-23 NOTE — Progress Notes (Unsigned)
Patient enrolled for [employer] to mail a 14 day ZIO XT long term holter monitor to address on file.

## 2021-03-29 DIAGNOSIS — R000 Tachycardia, unspecified: Secondary | ICD-10-CM | POA: Diagnosis not present

## 2021-03-29 DIAGNOSIS — R002 Palpitations: Secondary | ICD-10-CM | POA: Diagnosis not present

## 2021-04-09 ENCOUNTER — Encounter: Payer: Self-pay | Admitting: Nurse Practitioner

## 2021-04-29 ENCOUNTER — Telehealth: Payer: Self-pay

## 2021-04-29 DIAGNOSIS — R002 Palpitations: Secondary | ICD-10-CM

## 2021-04-29 DIAGNOSIS — I493 Ventricular premature depolarization: Secondary | ICD-10-CM

## 2021-04-29 DIAGNOSIS — R000 Tachycardia, unspecified: Secondary | ICD-10-CM

## 2021-04-29 NOTE — Telephone Encounter (Signed)
The patient has been notified of the result and verbalized understanding.  All questions (if any) were answered. Antonieta Iba, RN 04/29/2021 5:19 PM

## 2021-04-29 NOTE — Telephone Encounter (Signed)
-----   Message from Sueanne Margarita, MD sent at 04/29/2021  4:16 PM EDT ----- Average heart rate is 91bpm with occasional extra heart beats from the top and bottom of the heart which are benign.  Avoid ETOH and caffeinePlease have her come in for BMET, TSH and MAg

## 2021-05-01 ENCOUNTER — Other Ambulatory Visit: Payer: Self-pay

## 2021-05-01 ENCOUNTER — Other Ambulatory Visit: Payer: Commercial Managed Care - PPO | Admitting: *Deleted

## 2021-05-01 DIAGNOSIS — R002 Palpitations: Secondary | ICD-10-CM

## 2021-05-01 DIAGNOSIS — R000 Tachycardia, unspecified: Secondary | ICD-10-CM

## 2021-05-01 DIAGNOSIS — I493 Ventricular premature depolarization: Secondary | ICD-10-CM

## 2021-05-02 LAB — BASIC METABOLIC PANEL
BUN/Creatinine Ratio: 23 (ref 9–23)
BUN: 18 mg/dL (ref 6–24)
CO2: 20 mmol/L (ref 20–29)
Calcium: 9.7 mg/dL (ref 8.7–10.2)
Chloride: 95 mmol/L — ABNORMAL LOW (ref 96–106)
Creatinine, Ser: 0.79 mg/dL (ref 0.57–1.00)
Glucose: 352 mg/dL — ABNORMAL HIGH (ref 65–99)
Potassium: 4.1 mmol/L (ref 3.5–5.2)
Sodium: 134 mmol/L (ref 134–144)
eGFR: 96 mL/min/{1.73_m2} (ref >59)

## 2021-05-02 LAB — MAGNESIUM: Magnesium: 1.7 mg/dL (ref 1.6–2.3)

## 2021-05-02 LAB — TSH: TSH: 1.09 u[IU]/mL (ref 0.450–4.500)

## 2021-05-03 ENCOUNTER — Encounter: Payer: Self-pay | Admitting: Nurse Practitioner

## 2021-05-07 ENCOUNTER — Other Ambulatory Visit: Payer: Self-pay | Admitting: Nurse Practitioner

## 2021-05-07 DIAGNOSIS — E785 Hyperlipidemia, unspecified: Secondary | ICD-10-CM

## 2021-05-07 DIAGNOSIS — Z1159 Encounter for screening for other viral diseases: Secondary | ICD-10-CM

## 2021-05-07 DIAGNOSIS — E1165 Type 2 diabetes mellitus with hyperglycemia: Secondary | ICD-10-CM

## 2021-05-18 ENCOUNTER — Encounter (HOSPITAL_COMMUNITY): Payer: Self-pay

## 2021-05-18 ENCOUNTER — Emergency Department (HOSPITAL_COMMUNITY)
Admission: EM | Admit: 2021-05-18 | Discharge: 2021-05-19 | Disposition: A | Discharge status: Home / Self Care | Payer: Medicaid Other | Attending: Emergency Medicine | Admitting: Emergency Medicine

## 2021-05-18 ENCOUNTER — Other Ambulatory Visit: Payer: Self-pay

## 2021-05-18 ENCOUNTER — Emergency Department (HOSPITAL_COMMUNITY): Payer: Medicaid Other

## 2021-05-18 DIAGNOSIS — J45909 Unspecified asthma, uncomplicated: Secondary | ICD-10-CM | POA: Insufficient documentation

## 2021-05-18 DIAGNOSIS — Z87891 Personal history of nicotine dependence: Secondary | ICD-10-CM | POA: Insufficient documentation

## 2021-05-18 DIAGNOSIS — R509 Fever, unspecified: Secondary | ICD-10-CM | POA: Insufficient documentation

## 2021-05-18 DIAGNOSIS — R079 Chest pain, unspecified: Secondary | ICD-10-CM | POA: Insufficient documentation

## 2021-05-18 DIAGNOSIS — E119 Type 2 diabetes mellitus without complications: Secondary | ICD-10-CM | POA: Insufficient documentation

## 2021-05-18 LAB — CBC
HCT: 42.2 % (ref 36.0–46.0)
Hemoglobin: 14 g/dL (ref 12.0–15.0)
MCH: 30.3 pg (ref 26.0–34.0)
MCHC: 33.2 g/dL (ref 30.0–36.0)
MCV: 91.3 fL (ref 80.0–100.0)
Platelets: 347 10*3/uL (ref 150–400)
RBC: 4.62 MIL/uL (ref 3.87–5.11)
RDW: 13.2 % (ref 11.5–15.5)
WBC: 15.6 10*3/uL — ABNORMAL HIGH (ref 4.0–10.5)
nRBC: 0 % (ref 0.0–0.2)

## 2021-05-18 LAB — BASIC METABOLIC PANEL
Anion gap: 11 (ref 5–15)
BUN: 17 mg/dL (ref 6–20)
CO2: 23 mmol/L (ref 22–32)
Calcium: 9.1 mg/dL (ref 8.9–10.3)
Chloride: 101 mmol/L (ref 98–111)
Creatinine, Ser: 1.13 mg/dL — ABNORMAL HIGH (ref 0.44–1.00)
GFR, Estimated: >60 mL/min (ref >60)
Glucose, Bld: 361 mg/dL — ABNORMAL HIGH (ref 70–99)
Potassium: 4.1 mmol/L (ref 3.5–5.1)
Sodium: 135 mmol/L (ref 135–145)

## 2021-05-18 LAB — TROPONIN I (HIGH SENSITIVITY): Troponin I (High Sensitivity): 3 ng/L (ref <18)

## 2021-05-18 LAB — I-STAT BETA HCG BLOOD, ED (MC, WL, AP ONLY): I-stat hCG, quantitative: <5 m[IU]/mL (ref <5)

## 2021-05-18 MED ORDER — SODIUM CHLORIDE 0.9 % IV BOLUS
1000.0000 mL | Freq: Once | INTRAVENOUS | Status: AC
  Administered 2021-05-18: 1000 mL via INTRAVENOUS

## 2021-05-18 MED ORDER — KETOROLAC TROMETHAMINE 15 MG/ML IJ SOLN
15.0000 mg | Freq: Once | INTRAMUSCULAR | Status: AC
  Administered 2021-05-18: 15 mg via INTRAVENOUS
  Filled 2021-05-18: qty 1

## 2021-05-18 NOTE — ED Notes (Signed)
Pt urine specimen/UA obtained, placed in triage urine bin, holding for order. 

## 2021-05-18 NOTE — ED Triage Notes (Signed)
Pt reports having chest pain for 2 days that radiates to her left arm. Pt states that the feeling is tight and she feels like she has been running a marathon.

## 2021-05-18 NOTE — ED Provider Notes (Signed)
Emergency Medicine Provider Triage Evaluation Note  Michelle Gibson , a 45 y.o. female  was evaluated in triage.  Pt complains of chest pain, fever x 2 days, febrile with tmax of 102 while at home, improved with OTC medication. NO sick contacts. Hx of WPW, feels like she used to feel prior to her ablation in the 80's. Non smoker.   Review of Systems  Positive: Chest pain, shortness of breath, fever Negative: Nausea, vomiting, diarrhea, abdominal pain  Physical Exam  BP 127/80   Pulse (!) 125   Temp 98.2 F (36.8 C)   Resp 16   Ht '5\' 6"'$  (1.676 m)   Wt 87.1 kg   SpO2 95%   BMI 30.99 kg/m  Gen:   Awake, no distress   Resp:  Normal effort  MSK:   Moves extremities without difficulty  Other:    Medical Decision Making  Medically screening exam initiated at 9:56 PM.  Appropriate orders placed.  Michelle Gibson was informed that the remainder of the evaluation will be completed by another provider, this initial triage assessment does not replace that evaluation, and the importance of remaining in the ED until their evaluation is complete.  Prior hx of WPW prior ablation in the 80's feels chest pain is similar before intervention, exacerbated with ambulation, feels fatigued "like running a marathon".  Cardiologist, Dr. Radford Pax, recent Holter monitor with a HR in the 180s   Janeece Fitting, PA-C 05/18/21 2159    Daleen Bo, MD 05/19/21 1021

## 2021-05-18 NOTE — ED Notes (Signed)
Pt ambulatory in ED lobby. 

## 2021-05-18 NOTE — ED Provider Notes (Signed)
[facility] Emergency Department Provider Note MRN:  [id number]  Arrival date & time: 05/19/21     Chief Complaint   Chest Pain   History of Present Illness   Michelle Gibson is a 45 y.o. year-old female with a history of Wolff-Parkinson-White status post ablation, diabetes presenting to the ED with chief complaint of chest pain.  Fever, chest pain for the past 2 days.  Denies headache or vision change, no neck pain, no back pain, no shortness of breath, no abdominal pain, chronic dysuria, endorses a history of interstitial cystitis.  Denies any rash.  Feels like her heart has been racing intermittently, wore a Holter monitor recently but not currently.  Symptoms constant, mild, no exacerbating or alleviating factors.  Review of Systems  A complete 10 system review of systems was obtained and all systems are negative except as noted in the HPI and PMH.   Patient's Health History    Past Medical History:  Diagnosis Date   Acute tear lateral meniscus, right, initial encounter 08/29/2019   Asthma    Closed fracture of upper end of fibula 09/21/2018   Diabetes mellitus without complication ([place])    TYPE II   Hyperglycemia 09/15/2016   Migraines    WPW (Wolff-Parkinson-White syndrome)    s/p ablation at age 26    Past Surgical History:  Procedure Laterality Date   ABDOMINAL HYSTERECTOMY     CARDIAC SURGERY     CESAREAN SECTION     KNEE ARTHROSCOPY WITH LATERAL MENISECTOMY Right 08/29/2019   Procedure: PARTIAL LATERAL MENISECTOMY;  Surgeon: Leandrew Koyanagi, MD;  Location: [facility] [facility];  Service: Orthopedics;  Laterality: Right;   KNEE ARTHROSCOPY WITH MEDIAL MENISECTOMY Right 08/29/2019   Procedure: RIGHT KNEE ARTHROSCOPY WITH PARTIAL MEDIAL MENISECTOMY;  Surgeon: Leandrew Koyanagi, MD;  Location: [facility];  Service: Orthopedics;  Laterality: Right;    Family History  Problem Relation Age of Onset   Hypertension Mother    CAD  Mother 31   Diabetes Mother    Hypertension Father    CAD Father 28       CABG    Social History   Socioeconomic History   Marital status: Divorced    Spouse name: Not on file   Number of children: Not on file   Years of education: Not on file   Highest education level: Not on file  Occupational History   Not on file  Tobacco Use   Smoking status: Former    Packs/day: 0.10    Types: Cigarettes   Smokeless tobacco: Never  Vaping Use   Vaping Use: Never used  Substance and Sexual Activity   Alcohol use: No   Drug use: No   Sexual activity: Not on file  Other Topics Concern   Not on file  Social History Narrative   Not on file   Social Determinants of Health   Financial Resource Strain: Not on file  Food Insecurity: Not on file  Transportation Needs: Not on file  Physical Activity: Not on file  Stress: Not on file  Social Connections: Not on file  Intimate Partner Violence: Not on file     Physical Exam   Vitals:   05/19/21 0115 05/19/21 0130  BP: 95/66 106/65  Pulse: 96 85  Resp: 13 12  Temp:    SpO2: 100% 99%    CONSTITUTIONAL: Well-appearing, NAD NEURO:  Alert and oriented x 3, no focal deficits EYES:  eyes equal  and reactive ENT/NECK:  no LAD, no JVD CARDIO: Regular rate, well-perfused, normal S1 and S2 PULM:  CTAB no wheezing or rhonchi GI/GU:  normal bowel sounds, non-distended, non-tender MSK/SPINE:  No gross deformities, no edema SKIN:  no rash, atraumatic PSYCH:  Appropriate speech and behavior  *Additional and/or pertinent findings included in MDM below  Diagnostic and Interventional Summary    EKG Interpretation  Date/Time:  Monday May 18 2021 21:35:47 EDT Ventricular Rate:  123 PR Interval:  120 QRS Duration: 84 QT Interval:  287 QTC Calculation: 411 R Axis:   53 Text Interpretation: Sinus tachycardia Nonspecific repol abnormality, diffuse leads Baseline wander in lead(s) III aVF 12 Lead; [place] Confirmed by Gerlene Fee  [phone]) on 05/18/2021 11:02:20 PM       Labs Reviewed  BASIC METABOLIC PANEL - Abnormal; Notable for the following components:      Result Value   Glucose, Bld 361 (*)    Creatinine, Ser 1.13 (*)    All other components within normal limits  CBC - Abnormal; Notable for the following components:   WBC 15.6 (*)    All other components within normal limits  URINALYSIS, ROUTINE W REFLEX MICROSCOPIC - Abnormal; Notable for the following components:   APPearance HAZY (*)    Specific Gravity, Urine 1.036 (*)    Glucose, UA >=500 (*)    Leukocytes,Ua SMALL (*)    All other components within normal limits  D-DIMER, QUANTITATIVE  I-STAT BETA HCG BLOOD, ED (MC, [facility], AP ONLY)  TROPONIN I (HIGH SENSITIVITY)  TROPONIN I (HIGH SENSITIVITY)    DG Chest 2 View  Final Result      Medications  sodium chloride 0.9 % bolus 1,000 mL (0 mLs Intravenous Stopped 05/19/21 0114)  ketorolac (TORADOL) 15 MG/ML injection 15 mg (15 mg Intravenous Given 05/18/21 2350)     Procedures  /  Critical Care Procedures  ED Course and Medical Decision Making  I have reviewed the triage vital signs, the nursing notes, and pertinent available records from the EMR.  Listed above are laboratory and imaging tests that I personally ordered, reviewed, and interpreted and then considered in my medical decision making (see below for details).  Suspect viral illness, also considering pneumonia.  PE felt to be less likely.  Minimal risk factors for ACS.  History of Wolff-Parkinson-White but has had ablation in the past.  EKG does not show any signs of WPW, sinus rhythm.  Awaiting D-dimer, providing fluids and will reassess.  Tested positive for COVID less than 3 months ago, no need to retest.    Work-up reassuring with troponin negative x2, D-dimer negative, patient monitored for multiple hours on cardiac monitoring with no ectopy.  Patient is appropriate for discharge.   [name], [place] [email or web address]  Final Clinical Impressions(s) / ED Diagnoses     ICD-10-CM   1. Chest pain, unspecified type  R07.9     2. Fever, unspecified fever cause  R50.9       ED Discharge Orders     None        Discharge Instructions Discussed with and Provided to Patient:     Discharge Instructions      You were evaluated in the Emergency Department and after careful evaluation, we did not find any emergent condition requiring admission or further testing in the hospital.  Your exam/testing today is overall reassuring.  Recommend close follow-up with your primary care doctor and/or cardiologist.  Please return to the Emergency Department if you experience any worsening of your condition.   Thank you for allowing [place] to be a part of your care.        Maudie Flakes, MD 05/19/21 0200

## 2021-05-19 ENCOUNTER — Telehealth: Payer: Medicaid Other | Admitting: Physician Assistant

## 2021-05-19 DIAGNOSIS — J0110 Acute frontal sinusitis, unspecified: Secondary | ICD-10-CM

## 2021-05-19 DIAGNOSIS — J069 Acute upper respiratory infection, unspecified: Secondary | ICD-10-CM

## 2021-05-19 DIAGNOSIS — Z5321 Procedure and treatment not carried out due to patient leaving prior to being seen by health care provider: Secondary | ICD-10-CM

## 2021-05-19 LAB — URINALYSIS, ROUTINE W REFLEX MICROSCOPIC
Bacteria, UA: NONE SEEN
Bilirubin Urine: NEGATIVE
Glucose, UA: >=500 mg/dL — AB
Hgb urine dipstick: NEGATIVE
Ketones, ur: NEGATIVE mg/dL
Nitrite: NEGATIVE
Protein, ur: NEGATIVE mg/dL
Specific Gravity, Urine: 1.036 — ABNORMAL HIGH (ref 1.005–1.030)
pH: 5 (ref 5.0–8.0)

## 2021-05-19 LAB — D-DIMER, QUANTITATIVE: D-Dimer, Quant: 0.31 ug/mL-FEU (ref 0.00–0.50)

## 2021-05-19 LAB — TROPONIN I (HIGH SENSITIVITY): Troponin I (High Sensitivity): 2 ng/L (ref <18)

## 2021-05-19 NOTE — Progress Notes (Signed)
Based on the information that you have shared in the e-Visit Questionnaire, we recommend that you convert this visit to a video visit in order for the provider to better assess what is going on.  You indicated that you are needing oxygen and it is hard for you to breathe. If you truly are not able to breathe then this visit may be more appropriately treated in person so that vital signs can be taken, etc. If this symptom is minimal, your complaints may be appropriate for a video visit. The provider will be able to give you a more accurate diagnosis and treatment plan if we can more freely discuss your symptoms and with the addition of a virtual examination.   If you convert to a video visit, we will bill your insurance (similar to an office visit) and you will not be charged for this e-Visit. You will be able to stay at home and speak with the first available [facility] advanced practice provider. The link to do a video visit is in the drop down Menu tab of your Welcome screen in [place].

## 2021-05-19 NOTE — Progress Notes (Signed)
Virtual Visit Consent   [place], you are scheduled for a virtual visit with a [facility] provider today.     Just as with appointments in the office, your consent must be obtained to participate.  Your consent will be active for this visit and any virtual visit you may have with one of our providers in the next 365 days.     If you have a MyChart account, a copy of this consent can be sent to you electronically.  All virtual visits are billed to your insurance company just like a traditional visit in the office.    As this is a virtual visit, video technology does not allow for your provider to perform a traditional examination.  This may limit your provider's ability to fully assess your condition.  If your provider identifies any concerns that need to be evaluated in person or the need to arrange testing (such as labs, EKG, etc.), we will make arrangements to do so.     Although advances in technology are sophisticated, we cannot ensure that it will always work on either your end or our end.  If the connection with a video visit is poor, the visit may have to be switched to a telephone visit.  With either a video or telephone visit, we are not always able to ensure that we have a secure connection.     I need to obtain your verbal consent now.   Are you willing to proceed with your visit today?    Michelle Gibson has provided verbal consent on 05/19/2021 for a virtual visit (video or telephone).   [name], [place]   Date: 05/19/2021 11:18 AM   Virtual Visit via Video Note   I, [name], PA-C, attempted to connect with Michelle Gibson; MRN [id number] on 05/19/21 via [facility] to complete a video urgent care visit. The patient was unable to successfully connect to the video platform. As such, the patient was contacted by this provider via phone to complete the encounter.   Location: Patient: Virtual Visit Location Patient: Home Provider: Virtual Visit Location  Provider: Home Office   I discussed the limitations of evaluation and management by telemedicine and the availability of in person appointments. The patient expressed understanding and agreed to proceed.    History of Present Illness: Michelle Gibson is a 45 y.o. who identifies as a female who was assigned female at birth, and is being seen today for URI symptoms. Patient notes yesterday with a mild sore throat that has continued since then. Notes development of fever with (Tmax 102.5). Notes sinus pressure and nasal congestion. At home COVID test today which is negative. Now with sinus pain. Has been taking ibuprofen and tylenol which helps with fever but returns once medication runs out.   HPI: HPI  Problems:  Patient Active Problem List   Diagnosis Date Noted   History of COVID-19 03/19/2021   Tachycardia 03/19/2021   Palpitations 03/19/2021   Cough 03/19/2021   Complete tear of right ACL, initial encounter 12/10/2020   Acute tear lateral meniscus, right, initial encounter 08/29/2019   Acute medial meniscus tear of right knee 08/22/2019   Fever and chills 11/17/2018   Closed fracture of upper end of fibula 09/21/2018   WPW (Wolff-Parkinson-White syndrome) 10/10/2017   Asthma with acute exacerbation 09/15/2016   Hyperglycemia 09/15/2016   History of Wolff-Parkinson-White (WPW) syndrome 09/15/2016   Acute respiratory failure with hypoxia ([place]) 09/15/2016   Bronchospasm 09/11/2016  Allergies:  Allergies  Allergen Reactions   Ciprofloxacin Hives   Lyrica Cr [Pregabalin Er] Other (See Comments)    Suicidal ideation   Flecainide Hives, Nausea And Vomiting and Rash   Latex Rash and Other (See Comments)    Causes blisters, also   Penicillin G Rash    Has patient had a PCN reaction causing immediate rash, facial/tongue/throat swelling, SOB or lightheadedness with hypotension: yes Has patient had a PCN reaction causing severe rash involving mucus membranes or skin necrosis: yes Has  patient had a PCN reaction that required hospitalization: no Has patient had a PCN reaction occurring within the last 10 years: no If all of the above answers are "NO", then may proceed with Cephalosporin use.    Medications:  Current Outpatient Medications:    Acetaminophen-Caff-Pyrilamine (MIDOL COMPLETE PO), Take 1 tablet by mouth daily as needed (pain)., Disp: , Rfl:    Blood Glucose Monitoring Suppl (ONETOUCH VERIO REFLECT) w/Device KIT, USE TO CHECK BLOOD SUGAR 3 TIMES DAILY, Disp: 1 kit, Rfl: 0   Blood Glucose Monitoring Suppl (ONETOUCH VERIO) w/Device KIT, Use to check blood sugar TID., Disp: 1 kit, Rfl: 0   fluticasone (FLONASE) 50 MCG/ACT nasal spray, PLACE 2 SPRAYS INTO BOTH NOSTRILS DAILY. (Patient not taking: No sig reported), Disp: 16 g, Rfl: 0   gabapentin (NEURONTIN) 300 MG capsule, TAKE 2 CAPSULES (600 MG TOTAL) BY MOUTH 3 (THREE) TIMES DAILY. (Patient taking differently: Take 900 mg by mouth 2 (two) times daily.), Disp: 180 capsule, Rfl: 3   glucose blood test strip, USE TO CHECK BLOOD SUGAR 3 TIMES DAILY (Patient taking differently: USE TO CHECK BLOOD SUGAR 3 TIMES DAILY), Disp: 100 strip, Rfl: 2   ibuprofen (ADVIL) 200 MG tablet, Take 800 mg by mouth every 6 (six) hours as needed for mild pain., Disp: , Rfl:    insulin glargine (LANTUS) 100 unit/mL SOPN, Inject 20 Units into the skin daily., Disp: , Rfl:    Insulin Glargine-yfgn 100 UNIT/ML SOPN, INJECT 20 UNITS INTO THE SKIN DAILY. (Patient not taking: No sig reported), Disp: 15 mL, Rfl: 11   Insulin Pen Needle 31G X 5 MM MISC, USE AS INSTRUCTED. CHECK BLOOD GLUCOSE LEVEL BY FINGERSTICK TWICE PER DAY., Disp: 100 each, Rfl: 6   ipratropium-albuterol (DUONEB) 0.5-2.5 (3) MG/3ML SOLN, Take 3 mLs by nebulization every 6 (six) hours as needed (wheezing)., Disp: , Rfl:    Lancets (ONETOUCH DELICA PLUS [id number]) MISC, USE TO CHECK BLOOD SUGAR 3 TIMES DAILY, Disp: 100 each, Rfl: 2   metFORMIN (GLUCOPHAGE) 500 MG tablet, Take 1 tablet  (500 mg total) by mouth 2 (two) times daily with a meal. (Patient not taking: Reported on 05/18/2021), Disp: 180 tablet, Rfl: 1   OneTouch Delica Lancets 83A MISC, Use to check blood sugar TID., Disp: 100 each, Rfl: 2   sertraline (ZOLOFT) 100 MG tablet, TAKE 1 TABLET (100 MG TOTAL) BY MOUTH DAILY. (Patient taking differently: Take 100 mg by mouth daily.), Disp: 90 tablet, Rfl: 1   tamsulosin (FLOMAX) 0.4 MG CAPS capsule, TAKE 1 CAPSULE (0.4 MG TOTAL) BY MOUTH DAILY. (Patient taking differently: Take 0.4 mg by mouth daily.), Disp: 7 capsule, Rfl: 0   topiramate (TOPAMAX) 50 MG tablet, TAKE 1 TABLET (50 MG TOTAL) BY MOUTH DAILY. (Patient taking differently: Take 50 mg by mouth daily as needed (migraine).), Disp: 90 tablet, Rfl: 0  Observations/Objective: Patient is well-developed, well-nourished in no acute distress.  Resting comfortably at home.  Head is normocephalic, atraumatic.  No  labored breathing. Speech is clear and coherent with logical content.  Patient is alert and oriented at baseline.   Assessment and Plan: 1. Acute non-recurrent frontal sinusitis She is to retest for COVID 24-36 hours from her original test just to be cautious. Giving sinus pain and history, will Rx Doxycycline.  Increase fluids.  Rest.  Saline nasal spray.  Probiotic.  Mucinex as directed.  Humidifier in bedroom. Continue Flonase but adding on claritin and plain Mucinex .  Call or return to clinic if symptoms are not improving.    Follow Up Instructions: I discussed the assessment and treatment plan with the patient. The patient was provided an opportunity to ask questions and all were answered. The patient agreed with the plan and demonstrated an understanding of the instructions.  A copy of instructions were sent to the patient via MyChart.  The patient was advised to call back or seek an in-person evaluation if the symptoms worsen or if the condition fails to improve as anticipated.  Time:  I spent 15 minutes  with the patient via telehealth technology discussing the above problems/concerns.    [name], PA-C

## 2021-05-19 NOTE — Discharge Instructions (Addendum)
You were evaluated in the Emergency Department and after careful evaluation, we did not find any emergent condition requiring admission or further testing in the hospital.  Your exam/testing today is overall reassuring.  Recommend close follow-up with your primary care doctor and/or cardiologist.  Please return to the Emergency Department if you experience any worsening of your condition.   Thank you for allowing [place] to be a part of your care.

## 2021-05-19 NOTE — Patient Instructions (Signed)
Michelle Gibson, thank you for joining [name], PA-C for today's virtual visit.  While this provider is not your primary care provider (PCP), if your PCP is located in our provider database this encounter information will be shared with them immediately following your visit.  Consent: (Patient) Michelle Gibson provided verbal consent for this virtual visit at the beginning of the encounter.  Current Medications:  Current Outpatient Medications:    Acetaminophen-Caff-Pyrilamine (MIDOL COMPLETE PO), Take 1 tablet by mouth daily as needed (pain)., Disp: , Rfl:    atorvastatin (LIPITOR) 20 MG tablet, TAKE 1 TABLET (20 MG TOTAL) BY MOUTH DAILY. (Patient not taking: No sig reported), Disp: 90 tablet, Rfl: 3   Blood Glucose Monitoring Suppl (ONETOUCH VERIO REFLECT) w/Device KIT, USE TO CHECK BLOOD SUGAR 3 TIMES DAILY, Disp: 1 kit, Rfl: 0   Blood Glucose Monitoring Suppl (ONETOUCH VERIO) w/Device KIT, Use to check blood sugar TID., Disp: 1 kit, Rfl: 0   cetirizine (ZYRTEC) 10 MG tablet, TAKE 1 CAPSULE (10 MG TOTAL) BY MOUTH DAILY. (Patient not taking: No sig reported), Disp: 30 tablet, Rfl: 0   Cetirizine HCl 10 MG CAPS, Take 1 capsule (10 mg total) by mouth daily. (Patient not taking: Reported on 05/18/2021), Disp: 30 capsule, Rfl: 0   cyclobenzaprine (FLEXERIL) 5 MG tablet, TAKE 1 TABLET (5 MG TOTAL) BY MOUTH 2 (TWO) TIMES DAILY AS NEEDED FOR MUSCLE SPASMS. (Patient taking differently: Take 5 mg by mouth daily as needed for muscle spasms.), Disp: 30 tablet, Rfl: 1   empagliflozin (JARDIANCE) 10 MG TABS tablet, TAKE 1 TABLET (10 MG TOTAL) BY MOUTH DAILY BEFORE BREAKFAST. (Patient not taking: No sig reported), Disp: 30 tablet, Rfl: 1   fluticasone (FLONASE) 50 MCG/ACT nasal spray, PLACE 2 SPRAYS INTO BOTH NOSTRILS DAILY. (Patient not taking: No sig reported), Disp: 16 g, Rfl: 0   gabapentin (NEURONTIN) 300 MG capsule, TAKE 2 CAPSULES (600 MG TOTAL) BY MOUTH 3 (THREE) TIMES DAILY. (Patient  taking differently: Take 900 mg by mouth 2 (two) times daily.), Disp: 180 capsule, Rfl: 3   glucose blood test strip, USE TO CHECK BLOOD SUGAR 3 TIMES DAILY (Patient taking differently: USE TO CHECK BLOOD SUGAR 3 TIMES DAILY), Disp: 100 strip, Rfl: 2   ibuprofen (ADVIL) 200 MG tablet, Take 800 mg by mouth every 6 (six) hours as needed for mild pain., Disp: , Rfl:    insulin glargine (LANTUS) 100 unit/mL SOPN, Inject 20 Units into the skin daily., Disp: , Rfl:    insulin glargine (SEMGLEE) 100 UNIT/ML Solostar Pen, Inject 20 Units into the skin daily. (Patient not taking: No sig reported), Disp: 15 mL, Rfl: 11   Insulin Glargine-yfgn 100 UNIT/ML SOPN, INJECT 20 UNITS INTO THE SKIN DAILY. (Patient not taking: No sig reported), Disp: 15 mL, Rfl: 11   Insulin Pen Needle 31G X 5 MM MISC, USE AS INSTRUCTED. CHECK BLOOD GLUCOSE LEVEL BY FINGERSTICK TWICE PER DAY., Disp: 100 each, Rfl: 6   ipratropium-albuterol (DUONEB) 0.5-2.5 (3) MG/3ML SOLN, Take 3 mLs by nebulization every 6 (six) hours as needed (wheezing)., Disp: , Rfl:    Lancets (ONETOUCH DELICA PLUS [id number]) MISC, USE TO CHECK BLOOD SUGAR 3 TIMES DAILY, Disp: 100 each, Rfl: 2   metFORMIN (GLUCOPHAGE) 500 MG tablet, Take 1 tablet (500 mg total) by mouth 2 (two) times daily with a meal. (Patient not taking: Reported on 05/18/2021), Disp: 180 tablet, Rfl: 1   metFORMIN (GLUCOPHAGE) 500 MG tablet, TAKE 1 TABLET (500 MG TOTAL) BY  MOUTH 2 (TWO) TIMES DAILY WITH A MEAL. (Patient taking differently: Take 500 mg by mouth 2 (two) times daily with a meal.), Disp: 180 tablet, Rfl: 1   OneTouch Delica Lancets 91T MISC, Use to check blood sugar TID., Disp: 100 each, Rfl: 2   pregabalin (LYRICA) 50 MG capsule, TAKE 1 CAPSULE (50 MG TOTAL) BY MOUTH 3 (THREE) TIMES DAILY. (Patient not taking: No sig reported), Disp: 90 capsule, Rfl: 0   pseudoephedrine (SUDAFED) 30 MG tablet, TAKE 1 TABLET (30 MG TOTAL) BY MOUTH EVERY 4 (FOUR) HOURS AS NEEDED FOR CONGESTION. (Patient  not taking: No sig reported), Disp: 30 tablet, Rfl: 0   sertraline (ZOLOFT) 100 MG tablet, TAKE 1 TABLET (100 MG TOTAL) BY MOUTH DAILY. (Patient taking differently: Take 100 mg by mouth daily.), Disp: 90 tablet, Rfl: 1   tamsulosin (FLOMAX) 0.4 MG CAPS capsule, TAKE 1 CAPSULE (0.4 MG TOTAL) BY MOUTH DAILY. (Patient taking differently: Take 0.4 mg by mouth daily.), Disp: 7 capsule, Rfl: 0   topiramate (TOPAMAX) 50 MG tablet, TAKE 1 TABLET (50 MG TOTAL) BY MOUTH DAILY. (Patient taking differently: Take 50 mg by mouth daily as needed (migraine).), Disp: 90 tablet, Rfl: 0   Medications ordered in this encounter:  No orders of the defined types were placed in this encounter.    *If you need refills on other medications prior to your next appointment, please contact your pharmacy*  Follow-Up: Call back or seek an in-person evaluation if the symptoms worsen or if the condition fails to improve as anticipated.  Other Instructions Take a repeat COVID test in 24-36 hours from now as directed. You need to remain quarantined until these results are in. If positive, please let us or your PCP know.   Please take antibiotic as directed.  Increase fluid intake.  Use Saline nasal spray.  Take a daily multivitamin. Continue your Flonase.  Place a humidifier in the bedroom.  Please call or return clinic if symptoms are not improving.  Sinusitis Sinusitis is redness, soreness, and swelling (inflammation) of the paranasal sinuses. Paranasal sinuses are air pockets within the bones of your face (beneath the eyes, the middle of the forehead, or above the eyes). In healthy paranasal sinuses, mucus is able to drain out, and air is able to circulate through them by way of your nose. However, when your paranasal sinuses are inflamed, mucus and air can become trapped. This can allow bacteria and other germs to grow and cause infection. Sinusitis can develop quickly and last only a short time (acute) or continue over a long  period (chronic). Sinusitis that lasts for more than 12 weeks is considered chronic.  CAUSES  Causes of sinusitis include: Allergies. Structural abnormalities, such as displacement of the cartilage that separates your nostrils (deviated septum), which can decrease the air flow through your nose and sinuses and affect sinus drainage. Functional abnormalities, such as when the small hairs (cilia) that line your sinuses and help remove mucus do not work properly or are not present. SYMPTOMS  Symptoms of acute and chronic sinusitis are the same. The primary symptoms are pain and pressure around the affected sinuses. Other symptoms include: Upper toothache. Earache. Headache. Bad breath. Decreased sense of smell and taste. A cough, which worsens when you are lying flat. Fatigue. Fever. Thick drainage from your nose, which often is green and may contain pus (purulent). Swelling and warmth over the affected sinuses. DIAGNOSIS  Your caregiver will perform a physical exam. During the exam, your caregiver  may: Look in your nose for signs of abnormal growths in your nostrils (nasal polyps). Tap over the affected sinus to check for signs of infection. View the inside of your sinuses (endoscopy) with a special imaging device with a light attached (endoscope), which is inserted into your sinuses. If your caregiver suspects that you have chronic sinusitis, one or more of the following tests may be recommended: Allergy tests. Nasal culture A sample of mucus is taken from your nose and sent to a lab and screened for bacteria. Nasal cytology A sample of mucus is taken from your nose and examined by your caregiver to determine if your sinusitis is related to an allergy. TREATMENT  Most cases of acute sinusitis are related to a viral infection and will resolve on their own within 10 days. Sometimes medicines are prescribed to help relieve symptoms (pain medicine, decongestants, nasal steroid sprays, or  saline sprays).  However, for sinusitis related to a bacterial infection, your caregiver will prescribe antibiotic medicines. These are medicines that will help kill the bacteria causing the infection.  Rarely, sinusitis is caused by a fungal infection. In theses cases, your caregiver will prescribe antifungal medicine. For some cases of chronic sinusitis, surgery is needed. Generally, these are cases in which sinusitis recurs more than 3 times per year, despite other treatments. HOME CARE INSTRUCTIONS  Drink plenty of water. Water helps thin the mucus so your sinuses can drain more easily. Use a humidifier. Inhale steam 3 to 4 times a day (for example, sit in the bathroom with the shower running). Apply a warm, moist washcloth to your face 3 to 4 times a day, or as directed by your caregiver. Use saline nasal sprays to help moisten and clean your sinuses. Take over-the-counter or prescription medicines for pain, discomfort, or fever only as directed by your caregiver. SEEK IMMEDIATE MEDICAL CARE IF: You have increasing pain or severe headaches. You have nausea, vomiting, or drowsiness. You have swelling around your face. You have vision problems. You have a stiff neck. You have difficulty breathing. MAKE SURE YOU:  Understand these instructions. Will watch your condition. Will get help right away if you are not doing well or get worse. Document Released: 09/27/2005 Document Revised: 12/20/2011 Document Reviewed: 10/12/2011 [facility] Patient Information 2014 [place], [place].    If you have been instructed to have an in-person evaluation today at a local Urgent Care facility, please use the link below. It will take you to a list of all of our available [facility], including address, phone number and hours of operation. Please do not delay care.  [facility]  If you or a family member do not have a primary care provider, use the link below to schedule a visit and  establish care. When you choose a [facility] primary care physician or advanced practice provider, you gain a long-term partner in health. Find a Primary Care Provider  Learn more about [facility] in-office and virtual care options: [facility]

## 2021-05-19 NOTE — Progress Notes (Signed)
Patient not on video when provider connected. She had previously connected but logged off. Notifications sent x 2, 5-6 minutes apart. Waiting 12 minutes for patient to connect with no success. Patient considered a no-show. Will keep an eye out to see if she reconnects.

## 2021-05-25 ENCOUNTER — Ambulatory Visit: Payer: Self-pay | Attending: Nurse Practitioner | Admitting: Nurse Practitioner

## 2021-05-25 ENCOUNTER — Other Ambulatory Visit: Payer: Self-pay

## 2021-05-25 ENCOUNTER — Encounter: Payer: Self-pay | Admitting: Nurse Practitioner

## 2021-05-25 DIAGNOSIS — D72829 Elevated white blood cell count, unspecified: Secondary | ICD-10-CM

## 2021-05-25 DIAGNOSIS — F419 Anxiety disorder, unspecified: Secondary | ICD-10-CM

## 2021-05-25 DIAGNOSIS — G4733 Obstructive sleep apnea (adult) (pediatric): Secondary | ICD-10-CM

## 2021-05-25 DIAGNOSIS — E785 Hyperlipidemia, unspecified: Secondary | ICD-10-CM

## 2021-05-25 DIAGNOSIS — E1169 Type 2 diabetes mellitus with other specified complication: Secondary | ICD-10-CM

## 2021-05-25 DIAGNOSIS — F32A Depression, unspecified: Secondary | ICD-10-CM

## 2021-05-25 DIAGNOSIS — E1142 Type 2 diabetes mellitus with diabetic polyneuropathy: Secondary | ICD-10-CM

## 2021-05-25 DIAGNOSIS — Z1159 Encounter for screening for other viral diseases: Secondary | ICD-10-CM

## 2021-05-25 DIAGNOSIS — Z794 Long term (current) use of insulin: Secondary | ICD-10-CM

## 2021-05-25 MED ORDER — SERTRALINE HCL 100 MG PO TABS
ORAL_TABLET | Freq: Every day | ORAL | 1 refills | Status: DC
  Filled 2021-05-25 – 2022-04-21 (×3): qty 30, 30d supply, fill #0

## 2021-05-25 MED ORDER — INSULIN GLARGINE 100 UNIT/ML SOLOSTAR PEN
20.0000 [IU] | PEN_INJECTOR | Freq: Every day | SUBCUTANEOUS | 6 refills | Status: DC
  Filled 2021-05-25 – 2021-06-11 (×2): qty 6, 30d supply, fill #0

## 2021-05-25 MED ORDER — ATORVASTATIN CALCIUM 20 MG PO TABS
20.0000 mg | ORAL_TABLET | Freq: Every day | ORAL | 3 refills | Status: DC
  Filled 2021-05-25 – 2021-06-08 (×2): qty 30, 30d supply, fill #0

## 2021-05-25 MED ORDER — GABAPENTIN 300 MG PO CAPS
ORAL_CAPSULE | Freq: Three times a day (TID) | ORAL | 3 refills | Status: DC
Start: 2021-05-25 — End: 2021-08-18
  Filled 2021-05-25 – 2021-06-08 (×2): qty 180, 30d supply, fill #0

## 2021-05-25 MED ORDER — GLUCOSE BLOOD VI STRP
ORAL_STRIP | 2 refills | Status: AC
  Filled 2021-05-25: qty 100, 30d supply, fill #0
  Filled 2021-06-11 – 2021-08-21 (×2): qty 100, 33d supply, fill #0

## 2021-05-25 NOTE — Progress Notes (Signed)
Virtual Visit via Telephone Note Due to national recommendations of social distancing due to [place] 19, telehealth visit is felt to be most appropriate for this patient at this time.  I discussed the limitations, risks, security and privacy concerns of performing an evaluation and management service by telephone and the availability of in person appointments. I also discussed with the patient that there may be a patient responsible charge related to this service. The patient expressed understanding and agreed to proceed.    I connected with Michelle Gibson on 05/25/21  at   3:50 PM EDT  EDT by telephone and verified that I am speaking with the correct person using two identifiers.  Location of Patient: Private Residence   Location of Provider: [place] participating in Telemedicine visit: [name] FNP-BC [place]    History of Present Illness: Telemedicine visit for: DM  Well controlled with lantus 20 units nightly. She is not taking metformin 500 mg BID as prescribed. She was started on low dose statin today for CV risk. She takes gabapentin for peripheral neuropathy. States she has difficulty swallowing capsules. I have recommended that she take these in yogurt to help with swallowing. She does not have any difficulty with tablets.  Blood pressure is well controlled.  Lab Results  Component Value Date   HGBA1C 6.2 (A) 06/24/2020   BP Readings from Last 3 Encounters:  05/19/21 104/73  03/19/21 117/88  03/09/21 104/81     Sleep Apnea: Patient presents with possible obstructive sleep apnea. Patent has a several months history of symptoms of daytime fatigue and morning fatigue. Patient generally gets 2 or 3 hours of sleep per night, and states they generally have nightime awakenings and difficulty falling back asleep if awakened. Snoring of severe severity is present. Apneic episodes are present and WITNESSED. Nasal obstruction  is present.  Patient did not have tonsillectomy.     Anxiety and Depression Taking sertraline 100 mg daily as prescribed. Denies any current thoughts of self harm.  Past Medical History:  Diagnosis Date   Acute tear lateral meniscus, right, initial encounter 08/29/2019   Asthma    Closed fracture of upper end of fibula 09/21/2018   Diabetes mellitus without complication ([place])    TYPE II   Hyperglycemia 09/15/2016   Migraines    WPW (Wolff-Parkinson-White syndrome)    s/p ablation at age 71    Past Surgical History:  Procedure Laterality Date   ABDOMINAL HYSTERECTOMY     CARDIAC SURGERY     CESAREAN SECTION     KNEE ARTHROSCOPY WITH LATERAL MENISECTOMY Right 08/29/2019   Procedure: PARTIAL LATERAL MENISECTOMY;  Surgeon: Leandrew Koyanagi, MD;  Location: [place];  Service: Orthopedics;  Laterality: Right;   KNEE ARTHROSCOPY WITH MEDIAL MENISECTOMY Right 08/29/2019   Procedure: RIGHT KNEE ARTHROSCOPY WITH PARTIAL MEDIAL MENISECTOMY;  Surgeon: Leandrew Koyanagi, MD;  Location: [place];  Service: Orthopedics;  Laterality: Right;    Family History  Problem Relation Age of Onset   Hypertension Mother    CAD Mother 69   Diabetes Mother    Hypertension Father    CAD Father 19       CABG    Social History   Socioeconomic History   Marital status: Divorced    Spouse name: Not on file   Number of children: Not on file   Years of education: Not on file   Highest education level:  Not on file  Occupational History   Not on file  Tobacco Use   Smoking status: Former    Packs/day: 0.10    Types: Cigarettes   Smokeless tobacco: Never  Vaping Use   Vaping Use: Never used  Substance and Sexual Activity   Alcohol use: No   Drug use: No   Sexual activity: Not on file  Other Topics Concern   Not on file  Social History Narrative   Not on file   Social Determinants of Health   Financial Resource Strain: Not on file  Food Insecurity: Not on file   Transportation Needs: Not on file  Physical Activity: Not on file  Stress: Not on file  Social Connections: Not on file     Observations/Objective: Awake, alert and oriented x 3   Review of Systems  Constitutional:  Negative for fever, malaise/fatigue and weight loss.  HENT: Negative.  Negative for nosebleeds.   Eyes: Negative.  Negative for blurred vision, double vision and photophobia.  Respiratory: Negative.  Negative for cough and shortness of breath.   Cardiovascular: Negative.  Negative for chest pain, palpitations and leg swelling.  Gastrointestinal: Negative.  Negative for heartburn, nausea and vomiting.  Musculoskeletal: Negative.  Negative for myalgias.  Neurological: Negative.  Negative for dizziness, focal weakness, seizures and headaches.  Psychiatric/Behavioral:  Positive for depression. Negative for suicidal ideas. The patient is nervous/anxious.    Assessment and Plan: Diagnoses and all orders for this visit:  Type 2 diabetes mellitus with other specified complication, with long-term current use of insulin (HCC) -     glucose blood test strip; USE TO CHECK BLOOD SUGAR 3 TIMES DAILY -     insulin glargine-yfgn (SEMGLEE) 100 UNIT/ML Pen; Inject 20 Units into the skin daily. -     Hemoglobin A1c; Future -     CMP14+EGFR; Future -     Microalbumin / creatinine urine ratio; Future  Diabetic peripheral neuropathy associated with type 2 diabetes mellitus (HCC) -     gabapentin (NEURONTIN) 300 MG capsule; TAKE 2 CAPSULES (600 MG TOTAL) BY MOUTH 3 (THREE) TIMES DAILY.  Dyslipidemia, goal LDL below 70 -     Lipid panel; Future -     atorvastatin (LIPITOR) 20 MG tablet; Take 1 tablet (20 mg total) by mouth daily.  Anxiety and depression -     sertraline (ZOLOFT) 100 MG tablet; TAKE 1 TABLET (100 MG TOTAL) BY MOUTH DAILY.  Need for hepatitis C screening test -     HCV Ab w Reflex to Quant PCR; Future  Leukocytosis, unspecified type -     CBC with Differential;  Future  OSA (obstructive sleep apnea) -     Split night study; Future     Follow Up Instructions Return in about 3 months (around 08/25/2021).     I discussed the assessment and treatment plan with the patient. The patient was provided an opportunity to ask questions and all were answered. The patient agreed with the plan and demonstrated an understanding of the instructions.   The patient was advised to call back or seek an in-person evaluation if the symptoms worsen or if the condition fails to improve as anticipated.  I provided 15 minutes of non-face-to-face time during this encounter including median intraservice time, reviewing previous notes, labs, imaging, medications and explaining diagnosis and management.  Gildardo Pounds, FNP-BC

## 2021-06-01 ENCOUNTER — Other Ambulatory Visit: Payer: Self-pay

## 2021-06-03 ENCOUNTER — Ambulatory Visit: Payer: Self-pay | Admitting: *Deleted

## 2021-06-03 NOTE — Telephone Encounter (Signed)
I returned pt's call.   She had called in c/o a itchy rash on her upper legs and a vaginal yeast infection she got from being on Doxycycline for a sinus infection on 05/19/2021.  While on the Doxycycline she started developing hives on her arms.  They are itching also.  This is the first time she's taken Doxycycline.  The hives are still there are seem to be getting slowly worse, spreading.   I cautioned her to go to the ED if the hives spread to her upper chest, neck, face or she developed shortness of breath or chest tightness to go to the ED right away.   She verbalized understanding.    There are no appt available at [facility].   I have sent a note to [name], NP with pt's request for Diflucan for the vaginal yeast infection.   I instructed her to take a Benadryl 25 mg and if that did not help she could take a 2nd Benadryl 25 mg for the hives and itching.   She has tried cortisone cream on the rash on her legs but it has not helped.   She is requesting something for the hives and itchy rash on her upper legs.   She said the rash in on her thighs and inner thighs and is itching like in her vaginal area.   The rash is linear in appearance in her vaginal area and on her thighs.     Notes sent to [facility].   Pt agreeable to this plan that someone would call her back.

## 2021-06-03 NOTE — Telephone Encounter (Signed)
Reason for Disposition  [1] Severe localized itching AND [2] after 2 days of steroid cream  Answer Assessment - Initial Assessment Questions 1. APPEARANCE of RASH: "Describe the rash."      I had a fever 2 weeks ago and took doxycycline and I got a yeast infection.   I'm itching in my vaginal area.   The rash on my legs is coming up into the groin area and on the tops of my legs.    Noticed it today and it itches bad. 2. LOCATION: "Where is the rash located?"      On top of my thighs and inside my thighs.  I think the yeast infection is spreading.   I tried the Monostat One dose treatment. 3. NUMBER: "How many spots are there?"      Not asked 4. SIZE: "How big are the spots?" (Inches, centimeters or compare to size of a coin)      It's in lines 5. ONSET: "When did the rash start?"      My legs I noticed today.   The itching has been in the vaginal area for 2 wks since taking the antibiotics. 6. ITCHING: "Does the rash itch?" If Yes, ask: "How bad is the itch?"  (Scale 0-10; or none, mild, moderate, severe)     Yes I'm itching.  I have hives on my arms and itching too.   The hives started after taking the antibiotic during taking the Doxycycline.   I'm diabetics. 7. PAIN: "Does the rash hurt?" If Yes, ask: "How bad is the pain?"  (Scale 0-10; or none, mild, moderate, severe)    - NONE (0): no pain    - MILD (1-3): doesn't interfere with normal activities     - MODERATE (4-7): interferes with normal activities or awakens from sleep     - SEVERE (8-10): excruciating pain, unable to do any normal activities     No burning just itching bad.   8. OTHER SYMPTOMS: "Do you have any other symptoms?" (e.g., fever)     Itching    9. PREGNANCY: "Is there any chance you are pregnant?" "When was your last menstrual period?"     Not asked  Protocols used: Rash or Redness - Localized-A-AH

## 2021-06-04 ENCOUNTER — Other Ambulatory Visit: Payer: Self-pay | Admitting: Nurse Practitioner

## 2021-06-04 ENCOUNTER — Ambulatory Visit: Payer: Self-pay | Admitting: *Deleted

## 2021-06-04 MED ORDER — PREDNISONE 10 MG PO TABS
10.0000 mg | ORAL_TABLET | Freq: Every day | ORAL | 0 refills | Status: DC
  Filled 2021-06-04: qty 5, 5d supply, fill #0

## 2021-06-04 NOTE — Telephone Encounter (Signed)
Prednisone sent

## 2021-06-04 NOTE — Telephone Encounter (Signed)
Reason for Disposition  [1] Blood glucose > 240 mg/dL (13.3 mmol/L) AND [2] rapid breathing  Answer Assessment - Initial Assessment Questions 1. BLOOD GLUCOSE: "What is your blood glucose level?"      441 2. ONSET: "When did you check the blood glucose?"     Few minutes ago 3. USUAL RANGE: "What is your glucose level usually?" (e.g., usual fasting morning value, usual evening value)     200's,over 300 376 4. KETONES: "Do you check for ketones (urine or blood test strips)?" If yes, ask: "What does the test show now?"      no 5. TYPE 1 or 2:  "Do you know what type of diabetes you have?"  (e.g., Type 1, Type 2, Gestational; doesn't know)      type2 6. INSULIN: "Do you take insulin?" "What type of insulin(s) do you use? What is the mode of delivery? (syringe, pen; injection or pump)?"      Lantus 7. DIABETES PILLS: "Do you take any pills for your diabetes?" If yes, ask: "Have you missed taking any pills recently?"     Metformin. No missed doses 8. OTHER SYMPTOMS: "Do you have any symptoms?" (e.g., fever, frequent urination, difficulty breathing, dizziness, weakness, vomiting)     Blurred vision,rash, sob, frequent urination, rapid breathing  Protocols used: Diabetes - High Blood Sugar-A-AH

## 2021-06-04 NOTE — Telephone Encounter (Signed)
Routing to PCP for review.

## 2021-06-04 NOTE — Telephone Encounter (Signed)
Pt reports BS of 441, few minutes prior to call. States has been sick lately, BS usual in 200's recently 300, 376. No missed doses of insulin or oral agents. Reports blurred vision, SOB, rapid breathing and frequent urination. Also reports rash. Advised ED. Pt states will follow disposition, sister will drive.

## 2021-06-05 ENCOUNTER — Other Ambulatory Visit: Payer: Self-pay

## 2021-06-05 NOTE — Telephone Encounter (Signed)
States she had found Humalog from previous prescription and has been self administering insulin.  Asked patient how much insulin had she administer and she replied 15 units.  She said that brought her blood sugar down to 240's from almost 500's.   Blood sugar while on the phone was 293.  Patient advised she should be seen at [facility] or ED for further evaluation of hyperglycemia and rash. Advised that self administering insulin was dangerous and should administer dose directed by PCP.   She states she wanted to change PCP, to establish care with Durene Fruits, at [facility]. Scheduled apt. To establish care 06/2021 at 1350.

## 2021-06-08 ENCOUNTER — Other Ambulatory Visit: Payer: Self-pay | Admitting: Nurse Practitioner

## 2021-06-08 ENCOUNTER — Encounter: Payer: Self-pay | Admitting: Emergency Medicine

## 2021-06-08 ENCOUNTER — Inpatient Hospital Stay (HOSPITAL_COMMUNITY)
Admission: EM | Admit: 2021-06-08 | Discharge: 2021-06-09 | DRG: 639 | Disposition: A | Discharge status: Home / Self Care | Payer: Self-pay | Attending: Internal Medicine | Admitting: Internal Medicine

## 2021-06-08 ENCOUNTER — Telehealth: Payer: Self-pay | Admitting: Family

## 2021-06-08 ENCOUNTER — Other Ambulatory Visit: Payer: Self-pay

## 2021-06-08 ENCOUNTER — Encounter (HOSPITAL_COMMUNITY): Payer: Self-pay | Admitting: Emergency Medicine

## 2021-06-08 ENCOUNTER — Ambulatory Visit
Admission: EM | Admit: 2021-06-08 | Discharge: 2021-06-08 | Disposition: A | Discharge status: Home / Self Care | Payer: Medicaid Other | Attending: Nurse Practitioner | Admitting: Nurse Practitioner

## 2021-06-08 ENCOUNTER — Emergency Department (HOSPITAL_COMMUNITY): Payer: Self-pay

## 2021-06-08 ENCOUNTER — Ambulatory Visit: Payer: Self-pay | Admitting: *Deleted

## 2021-06-08 DIAGNOSIS — L240 Irritant contact dermatitis due to detergents: Secondary | ICD-10-CM

## 2021-06-08 DIAGNOSIS — Z880 Allergy status to penicillin: Secondary | ICD-10-CM

## 2021-06-08 DIAGNOSIS — R000 Tachycardia, unspecified: Secondary | ICD-10-CM | POA: Diagnosis not present

## 2021-06-08 DIAGNOSIS — R110 Nausea: Secondary | ICD-10-CM

## 2021-06-08 DIAGNOSIS — R739 Hyperglycemia, unspecified: Secondary | ICD-10-CM | POA: Diagnosis not present

## 2021-06-08 DIAGNOSIS — L299 Pruritus, unspecified: Secondary | ICD-10-CM

## 2021-06-08 DIAGNOSIS — E785 Hyperlipidemia, unspecified: Secondary | ICD-10-CM | POA: Diagnosis present

## 2021-06-08 DIAGNOSIS — Z87891 Personal history of nicotine dependence: Secondary | ICD-10-CM

## 2021-06-08 DIAGNOSIS — Z8616 Personal history of COVID-19: Secondary | ICD-10-CM

## 2021-06-08 DIAGNOSIS — Z20822 Contact with and (suspected) exposure to covid-19: Secondary | ICD-10-CM | POA: Diagnosis present

## 2021-06-08 DIAGNOSIS — R231 Pallor: Secondary | ICD-10-CM | POA: Diagnosis not present

## 2021-06-08 DIAGNOSIS — J4520 Mild intermittent asthma, uncomplicated: Secondary | ICD-10-CM | POA: Diagnosis present

## 2021-06-08 DIAGNOSIS — R21 Rash and other nonspecific skin eruption: Secondary | ICD-10-CM | POA: Diagnosis present

## 2021-06-08 DIAGNOSIS — Z79899 Other long term (current) drug therapy: Secondary | ICD-10-CM

## 2021-06-08 DIAGNOSIS — E1165 Type 2 diabetes mellitus with hyperglycemia: Secondary | ICD-10-CM

## 2021-06-08 DIAGNOSIS — I456 Pre-excitation syndrome: Secondary | ICD-10-CM | POA: Diagnosis present

## 2021-06-08 DIAGNOSIS — Z7984 Long term (current) use of oral hypoglycemic drugs: Secondary | ICD-10-CM

## 2021-06-08 DIAGNOSIS — U071 COVID-19: Secondary | ICD-10-CM

## 2021-06-08 DIAGNOSIS — Z888 Allergy status to other drugs, medicaments and biological substances status: Secondary | ICD-10-CM

## 2021-06-08 DIAGNOSIS — Z881 Allergy status to other antibiotic agents status: Secondary | ICD-10-CM

## 2021-06-08 DIAGNOSIS — G8929 Other chronic pain: Secondary | ICD-10-CM

## 2021-06-08 DIAGNOSIS — Z794 Long term (current) use of insulin: Secondary | ICD-10-CM

## 2021-06-08 DIAGNOSIS — G43909 Migraine, unspecified, not intractable, without status migrainosus: Secondary | ICD-10-CM | POA: Diagnosis present

## 2021-06-08 DIAGNOSIS — Z91040 Latex allergy status: Secondary | ICD-10-CM

## 2021-06-08 DIAGNOSIS — R519 Headache, unspecified: Secondary | ICD-10-CM

## 2021-06-08 DIAGNOSIS — Z833 Family history of diabetes mellitus: Secondary | ICD-10-CM

## 2021-06-08 LAB — BLOOD GAS, VENOUS
Acid-Base Excess: 0.4 mmol/L (ref 0.0–2.0)
Bicarbonate: 26 mmol/L (ref 20.0–28.0)
FIO2: 21
O2 Saturation: 76.4 %
Patient temperature: 98.6
pCO2, Ven: 47.7 mmHg (ref 44.0–60.0)
pH, Ven: 7.355 (ref 7.250–7.430)
pO2, Ven: 45 mmHg (ref 32.0–45.0)

## 2021-06-08 LAB — COMPREHENSIVE METABOLIC PANEL
ALT: 23 U/L (ref 0–44)
AST: 36 U/L (ref 15–41)
Albumin: 4.1 g/dL (ref 3.5–5.0)
Alkaline Phosphatase: 91 U/L (ref 38–126)
Anion gap: 14 (ref 5–15)
BUN: 20 mg/dL (ref 6–20)
CO2: 25 mmol/L (ref 22–32)
Calcium: 9.5 mg/dL (ref 8.9–10.3)
Chloride: 95 mmol/L — ABNORMAL LOW (ref 98–111)
Creatinine, Ser: 0.99 mg/dL (ref 0.44–1.00)
GFR, Estimated: >60 mL/min (ref >60)
Glucose, Bld: 569 mg/dL (ref 70–99)
Potassium: 4.7 mmol/L (ref 3.5–5.1)
Sodium: 134 mmol/L — ABNORMAL LOW (ref 135–145)
Total Bilirubin: 0.7 mg/dL (ref 0.3–1.2)
Total Protein: 8.4 g/dL — ABNORMAL HIGH (ref 6.5–8.1)

## 2021-06-08 LAB — CBC
HCT: 42.9 % (ref 36.0–46.0)
Hemoglobin: 14.6 g/dL (ref 12.0–15.0)
MCH: 30.2 pg (ref 26.0–34.0)
MCHC: 34 g/dL (ref 30.0–36.0)
MCV: 88.6 fL (ref 80.0–100.0)
Platelets: 488 10*3/uL — ABNORMAL HIGH (ref 150–400)
RBC: 4.84 MIL/uL (ref 3.87–5.11)
RDW: 12.7 % (ref 11.5–15.5)
WBC: 8.1 10*3/uL (ref 4.0–10.5)
nRBC: 0 % (ref 0.0–0.2)

## 2021-06-08 LAB — MAGNESIUM: Magnesium: 2.2 mg/dL (ref 1.7–2.4)

## 2021-06-08 LAB — I-STAT BETA HCG BLOOD, ED (MC, WL, AP ONLY): I-stat hCG, quantitative: <5 m[IU]/mL (ref <5)

## 2021-06-08 LAB — URINALYSIS, ROUTINE W REFLEX MICROSCOPIC
Bilirubin Urine: NEGATIVE
Glucose, UA: >=500 mg/dL — AB
Ketones, ur: 5 mg/dL — AB
Leukocytes,Ua: NEGATIVE
Nitrite: NEGATIVE
Protein, ur: NEGATIVE mg/dL
Specific Gravity, Urine: 1.027 (ref 1.005–1.030)
pH: 5 (ref 5.0–8.0)

## 2021-06-08 LAB — CBG MONITORING, ED
Glucose-Capillary: 528 mg/dL (ref 70–99)
Glucose-Capillary: 455 mg/dL — ABNORMAL HIGH (ref 70–99)
Glucose-Capillary: 474 mg/dL — ABNORMAL HIGH (ref 70–99)

## 2021-06-08 LAB — RESP PANEL BY RT-PCR (FLU A&B, COVID) ARPGX2
Influenza A by PCR: NEGATIVE
Influenza B by PCR: NEGATIVE
SARS Coronavirus 2 by RT PCR: NEGATIVE

## 2021-06-08 LAB — LIPASE, BLOOD: Lipase: 49 U/L (ref 11–51)

## 2021-06-08 LAB — TROPONIN I (HIGH SENSITIVITY)
Troponin I (High Sensitivity): 2 ng/L (ref <18)
Troponin I (High Sensitivity): <2 ng/L (ref <18)

## 2021-06-08 LAB — POCT FASTING CBG KUC MANUAL ENTRY: POCT Glucose (KUC): 287 mg/dL — AB (ref 70–99)

## 2021-06-08 LAB — D-DIMER, QUANTITATIVE: D-Dimer, Quant: <0.27 ug/mL-FEU (ref 0.00–0.50)

## 2021-06-08 LAB — BETA-HYDROXYBUTYRIC ACID: Beta-Hydroxybutyric Acid: 0.28 mmol/L — ABNORMAL HIGH (ref 0.05–0.27)

## 2021-06-08 MED ORDER — DEXTROSE 50 % IV SOLN: 0.0000 mL | INTRAVENOUS | Status: DC | PRN

## 2021-06-08 MED ORDER — DEXTROSE IN LACTATED RINGERS 5 % IV SOLN: INTRAVENOUS | Status: DC

## 2021-06-08 MED ORDER — LACTATED RINGERS IV BOLUS
1000.0000 mL | Freq: Once | INTRAVENOUS | Status: AC
  Administered 2021-06-08: 1000 mL via INTRAVENOUS

## 2021-06-08 MED ORDER — METHYLPREDNISOLONE SODIUM SUCC 125 MG IJ SOLR
62.5000 mg | Freq: Once | INTRAMUSCULAR | Status: AC
  Administered 2021-06-08: 62.5 mg via INTRAMUSCULAR

## 2021-06-08 MED ORDER — HYDROXYZINE PAMOATE 25 MG PO CAPS
25.0000 mg | ORAL_CAPSULE | Freq: Three times a day (TID) | ORAL | 1 refills | Status: DC | PRN
  Filled 2021-06-08: qty 60, 20d supply, fill #0

## 2021-06-08 MED ORDER — LACTATED RINGERS IV SOLN: INTRAVENOUS | Status: DC

## 2021-06-08 MED ORDER — TRIAMCINOLONE ACETONIDE 0.1 % EX CREA
1.0000 "application " | TOPICAL_CREAM | Freq: Two times a day (BID) | CUTANEOUS | 0 refills | Status: DC
  Filled 2021-06-08: qty 30, 15d supply, fill #0

## 2021-06-08 MED ORDER — ONDANSETRON HCL 4 MG/2ML IJ SOLN
4.0000 mg | Freq: Once | INTRAMUSCULAR | Status: AC
  Administered 2021-06-08: 4 mg via INTRAVENOUS
  Filled 2021-06-08: qty 2

## 2021-06-08 MED ORDER — INSULIN REGULAR(HUMAN) IN NACL 100-0.9 UT/100ML-% IV SOLN
INTRAVENOUS | Status: DC
  Administered 2021-06-08: 15 [IU]/h via INTRAVENOUS
  Filled 2021-06-08: qty 100

## 2021-06-08 MED FILL — Insulin Pen Needle 31 G X 5 MM (1/5" or 3/16"): 30 days supply | Qty: 100 | Fill #0 | Status: CN

## 2021-06-08 NOTE — ED Triage Notes (Signed)
Two weeks prior took doxycycline for a yeast infection. Developed a rash all over her legs/lower body. Denies itching or pain

## 2021-06-08 NOTE — Progress Notes (Signed)
Attempted to connect with patient multiple times. Never connected to video. Will close out.   Evelina Dun, FNP

## 2021-06-08 NOTE — ED Provider Notes (Signed)
[name]    CSN: [id number] Arrival date & time: 06/08/21  1012      History   Chief Complaint Chief Complaint  Patient presents with   Rash    HPI Tangie Mathea Frieling is a 45 y.o. female.   Subjective:   Gerald Cecille Mcclusky is a 45 y.o. female who presents for evaluation of rash. Rash started about 3 days after starting Doxycycline for throat infection. The patient stopped taking the Doxycycline after 3 days because she also developed a yeast infection which she self treated with OTC Monistat. She also reports changing to a new laundry detergent recently as well. The rash is generalized. Lesions are pink in color, are of raised texture, but not blistering. Rash has not changed over time.  Rash is pruritic. Patient denies: cough, fever, headache, irritability, myalgia, nausea, and vomiting. Patient has not had previous evaluation of rash. Patient has not had previous treatment.  Patient has not had contacts with similar rash. Notably, the patient has a history of type II diabetes and reports that her sugars have been "high" recently. She reports that she checks her glucose every 2 hours. Readings have been up to 500 at times but usually in the 200s. She reports compliance with prescribed insulin regimen. She is not on any oral therapies. She denies any excessive thirst, fatigue, loss of appetite, weakness, nausea, vomiting, dry mouth, fruity-scented breath, shortness of breath, frequent urination, blurred vision or confusion.     The following portions of the patient's history were reviewed and updated as appropriate: allergies, current medications, past family history, past medical history, past social history, past surgical history, and problem list.      Past Medical History:  Diagnosis Date   Acute tear lateral meniscus, right, initial encounter 08/29/2019   Asthma    Closed fracture of upper end of fibula 09/21/2018   Diabetes mellitus without complication ([place])     TYPE II   Hyperglycemia 09/15/2016   Migraines    WPW (Wolff-Parkinson-White syndrome)    s/p ablation at age 33    Patient Active Problem List   Diagnosis Date Noted   History of COVID-19 03/19/2021   Tachycardia 03/19/2021   Palpitations 03/19/2021   Cough 03/19/2021   Complete tear of right ACL, initial encounter 12/10/2020   Acute tear lateral meniscus, right, initial encounter 08/29/2019   Acute medial meniscus tear of right knee 08/22/2019   Fever and chills 11/17/2018   Closed fracture of upper end of fibula 09/21/2018   WPW (Wolff-Parkinson-White syndrome) 10/10/2017   Asthma with acute exacerbation 09/15/2016   Hyperglycemia 09/15/2016   History of Wolff-Parkinson-White (WPW) syndrome 09/15/2016   Acute respiratory failure with hypoxia ([place]) 09/15/2016   Bronchospasm 09/11/2016    Past Surgical History:  Procedure Laterality Date   ABDOMINAL HYSTERECTOMY     CARDIAC SURGERY     CESAREAN SECTION     KNEE ARTHROSCOPY WITH LATERAL MENISECTOMY Right 08/29/2019   Procedure: PARTIAL LATERAL MENISECTOMY;  Surgeon: Leandrew Koyanagi, MD;  Location: [place];  Service: Orthopedics;  Laterality: Right;   KNEE ARTHROSCOPY WITH MEDIAL MENISECTOMY Right 08/29/2019   Procedure: RIGHT KNEE ARTHROSCOPY WITH PARTIAL MEDIAL MENISECTOMY;  Surgeon: Leandrew Koyanagi, MD;  Location: [place];  Service: Orthopedics;  Laterality: Right;    OB History   No obstetric history on file.      Home Medications    Prior to Admission medications   Medication Sig Start Date End  Date Taking? Authorizing Provider  triamcinolone cream (KENALOG) 0.1 % Apply 1 application topically 2 (two) times daily. 06/08/21  Yes Enrique Sack, FNP  atorvastatin (LIPITOR) 20 MG tablet Take 1 tablet (20 mg total) by mouth daily. 05/25/21   Gildardo Pounds, NP  Blood Glucose Monitoring Suppl (ONETOUCH VERIO REFLECT) w/Device KIT USE TO CHECK BLOOD SUGAR 3 TIMES DAILY 12/10/20 12/10/21   Charlott Rakes, MD  Blood Glucose Monitoring Suppl (ONETOUCH VERIO) w/Device KIT Use to check blood sugar TID. 12/10/20   Charlott Rakes, MD  fluticasone (FLONASE) 50 MCG/ACT nasal spray PLACE 2 SPRAYS INTO BOTH NOSTRILS DAILY. Patient not taking: No sig reported 12/29/20 12/29/21  Scot Jun, FNP  gabapentin (NEURONTIN) 300 MG capsule TAKE 2 CAPSULES (600 MG TOTAL) BY MOUTH 3 (THREE) TIMES DAILY. 05/25/21 05/25/22  Gildardo Pounds, NP  glucose blood test strip USE TO CHECK BLOOD SUGAR 3 TIMES DAILY 05/25/21 05/25/22  Gildardo Pounds, NP  insulin glargine-yfgn (SEMGLEE) 100 UNIT/ML Pen Inject 20 Units into the skin daily. 05/25/21   Gildardo Pounds, NP  Insulin Pen Needle 31G X 5 MM MISC USE AS INSTRUCTED. CHECK BLOOD GLUCOSE LEVEL BY FINGERSTICK TWICE PER DAY. 12/09/20 12/09/21  Gildardo Pounds, NP  ipratropium-albuterol (DUONEB) 0.5-2.5 (3) MG/3ML SOLN Take 3 mLs by nebulization every 6 (six) hours as needed (wheezing).    [provider]  Lancets (ONETOUCH DELICA PLUS [id number]) MISC USE TO CHECK BLOOD SUGAR 3 TIMES DAILY 12/10/20 12/10/21  Charlott Rakes, MD  metFORMIN (GLUCOPHAGE) 500 MG tablet Take 1 tablet (500 mg total) by mouth 2 (two) times daily with a meal. Patient not taking: Reported on 05/18/2021 12/09/20 03/09/21  Gildardo Pounds, NP  OneTouch Delica Lancets 35K MISC Use to check blood sugar TID. 12/10/20   Charlott Rakes, MD  predniSONE (DELTASONE) 10 MG tablet Take 1 tablet (10 mg total) by mouth daily with breakfast for 5 days. 06/04/21 06/10/21  Gildardo Pounds, NP  sertraline (ZOLOFT) 100 MG tablet TAKE 1 TABLET (100 MG TOTAL) BY MOUTH DAILY. 05/25/21 05/25/22  Gildardo Pounds, NP  topiramate (TOPAMAX) 50 MG tablet TAKE 1 TABLET (50 MG TOTAL) BY MOUTH DAILY. Patient taking differently: Take 50 mg by mouth daily as needed (migraine). 12/09/20 12/09/21  Gildardo Pounds, NP  insulin glargine (LANTUS) 100 unit/mL SOPN Inject 20 Units into the skin daily.  05/25/21  [provider]  metoCLOPramide (REGLAN) 10 MG tablet Take 1 tablet (10 mg total) by mouth every 8 (eight) hours as needed for nausea or vomiting (headache). Patient not taking: Reported on 07/14/2020 07/07/20 07/17/20  Petrucelli, Glynda Jaeger, PA-C  traZODone (DESYREL) 50 MG tablet Take 0.5-1 tablets (25-50 mg total) by mouth at bedtime as needed for sleep. 06/04/19 07/13/19  Fulp, Ander Gaster, MD    Family History Family History  Problem Relation Age of Onset   Hypertension Mother    CAD Mother 34   Diabetes Mother    Hypertension Father    CAD Father 45       CABG    Social History Social History   Tobacco Use   Smoking status: Former    Packs/day: 0.10    Types: Cigarettes   Smokeless tobacco: Never  Vaping Use   Vaping Use: Never used  Substance Use Topics   Alcohol use: No   Drug use: No     Allergies   Ciprofloxacin, Lyrica cr [pregabalin er], Flecainide, Latex, and Penicillin g   Review of Systems  Review of Systems  Constitutional:  Negative for fatigue and fever.  Gastrointestinal:  Negative for nausea and vomiting.  Skin:  Positive for rash.  All other systems reviewed and are negative.   Physical Exam Triage Vital Signs ED Triage Vitals  Enc Vitals Group     BP 06/08/21 1150 124/87     Pulse Rate 06/08/21 1150 (!) 104     Resp 06/08/21 1150 16     Temp 06/08/21 1150 98 F (36.7 C)     Temp Source 06/08/21 1150 Oral     SpO2 06/08/21 1150 97 %     Weight --      Height --      Head Circumference --      Peak Flow --      Pain Score 06/08/21 1157 0     Pain Loc --      Pain Edu? --      Excl. in [place]? --    No data found.  Updated Vital Signs BP 124/87 (BP Location: Left Arm)   Pulse (!) 104   Temp 98 F (36.7 C) (Oral)   Resp 16   SpO2 97%   Visual Acuity Right Eye Distance:   Left Eye Distance:   Bilateral Distance:    Right Eye Near:   Left Eye Near:    Bilateral Near:     Physical Exam Vitals and nursing note reviewed.  Constitutional:       General: She is not in acute distress.    Appearance: Normal appearance. She is not ill-appearing, toxic-appearing or diaphoretic.  HENT:     Head: Normocephalic.  Cardiovascular:     Rate and Rhythm: Normal rate.  Pulmonary:     Effort: Pulmonary effort is normal.  Musculoskeletal:        General: Normal range of motion.     Cervical back: Normal range of motion and neck supple.  Skin:    General: Skin is warm and dry.     Findings: Rash present. Rash is not pustular or vesicular.     Comments: Diffused erythematous rash noted to bilateral lower extremities, back and torso.   Neurological:     General: No focal deficit present.     Mental Status: She is alert and oriented to person, place, and time.  Psychiatric:        Mood and Affect: Mood normal.        Behavior: Behavior normal.     UC Treatments / Results  Labs (all labs ordered are listed, but only abnormal results are displayed) Labs Reviewed  POCT FASTING CBG [facility] MANUAL ENTRY - Abnormal; Notable for the following components:      Result Value   POCT Glucose ([facility]) 287 (*)    All other components within normal limits    EKG   Radiology No results found.  Procedures Procedures (including critical care time)  Medications Ordered in UC Medications  methylPREDNISolone sodium succinate (SOLU-MEDROL) 125 mg/2 mL injection 62.5 mg (62.5 mg Intramuscular Given 06/08/21 1310)    Initial Impression / Assessment and Plan / UC Course  I have reviewed the triage vital signs and the nursing notes.  Pertinent labs & imaging results that were available during my care of the patient were reviewed by me and considered in my medical decision making (see chart for details).      45 yo female presenting with contact dermatitis. Etiology is unclear as she has recently been on doxycyline which she  states she hasn't had before and has changed laundry detergents. She has stopped the Doxycycline. Advised patient to switch back to  old laundry detergent. Solumedrol 62.5 mg IM given in clinic. No oral steroids given due to history of IDDM. Patient's glucose 287 in clinic. She doesn't know her last A1C. Last A1C done within the [facility] system was 6.2% back in September 2021.   Plan:  Aveeno baths Benadryl prn for itching. Information on the above diagnosis was given to the patient. Observe for signs of superimposed infection and systemic symptoms. Reassurance was given to the patient. Skin moisturizer. Watch for signs of fever or worsening of the rash. Monitor sugars closely  Follow-up with PCP in 1 week or sooner if needed   Today's evaluation has revealed no signs of a dangerous process. Discussed diagnosis with patient and/or guardian. Patient and/or guardian aware of their diagnosis, possible red flag symptoms to watch out for and need for close follow up. Patient and/or guardian understands verbal and written discharge instructions. Patient and/or guardian comfortable with plan and disposition.  Patient and/or guardian has a clear mental status at this time, good insight into illness (after discussion and teaching) and has clear judgment to make decisions regarding their care  This care was provided during an unprecedented National Emergency due to the Novel Coronavirus (COVID-19) pandemic. COVID-19 infections and transmission risks place heavy strains on healthcare resources.  As this pandemic evolves, our facility, providers, and staff strive to respond fluidly, to remain operational, and to provide care relative to available resources and information. Outcomes are unpredictable and treatments are without well-defined guidelines. Further, the impact of COVID-19 on all aspects of urgent care, including the impact to patients seeking care for reasons other than COVID-19, is unavoidable during this national emergency. At this time of the global pandemic, management of patients has significantly changed, even for non-COVID  positive patients given high local and regional COVID volumes at this time requiring high healthcare system and resource utilization. The standard of care for management of both COVID suspected and non-COVID suspected patients continues to change rapidly at the local, regional, national, and global levels. This patient was worked up and treated to the best available but ever changing evidence and resources available at this current time.   Documentation was completed with the aid of voice recognition software. Transcription may contain typographical errors.  Final Clinical Impressions(s) / UC Diagnoses   Final diagnoses:  Irritant contact dermatitis due to detergent  Pruritus  Type 2 diabetes mellitus with hyperglycemia, with long-term current use of insulin (HCC)     Discharge Instructions      Your rash is likely due to a dermatitis caused by either the new laundry detergent you've been using or the antibiotic you took last week. There is no way of knowing exactly which one it was. You have stopped the antibiotic and I advise that you don't take doxycycline again. Change your laundry detergent back to the one you were using before. We gave you a small dose of steroids here. You will not be given oral steroids due to your history of diabetes and high sugars. Continue to monitor your glucose in the morning, after meals and at bedtime. Go to the ED immediately if your blood sugar monitor reads "HI" and/or if you develop excessive thirst, fatigue, loss of appetite, weakness, nausea, vomiting, dry mouth, fruity-scented breath, shortness of breath, frequent urination, blurred vision or confusion.      ED Prescriptions  Medication Sig Dispense Auth. Provider   triamcinolone cream (KENALOG) 0.1 % Apply 1 application topically 2 (two) times daily. 30 g Enrique Sack, FNP      PDMP not reviewed this encounter.   Enrique Sack, [place] 06/08/21 1325

## 2021-06-08 NOTE — ED Provider Notes (Signed)
[facility] DEPT Provider Note   CSN: [id number] Arrival date & time: 06/08/21  1918     History Chief Complaint  Patient presents with   Hyperglycemia    Michelle Gibson is a 45 y.o. female with history of poorly controlled type 2 diabetes who presents with concern for feeling weak, intermittently diaphoretic, and short of breath.  States he has not had a fasting glucose under 300 in the last week.  Denies fever or chills.  Endorses nausea but denies vomiting.  I personally reads patient medical records.  She has history of WPW, used to be on as needed atenolol, does not have a cardiologist here in town.  HPI     Past Medical History:  Diagnosis Date   Acute tear lateral meniscus, right, initial encounter 08/29/2019   Asthma    Closed fracture of upper end of fibula 09/21/2018   Diabetes mellitus without complication ([place])    TYPE II   Hyperglycemia 09/15/2016   Migraines    WPW (Wolff-Parkinson-White syndrome)    s/p ablation at age 47    Patient Active Problem List   Diagnosis Date Noted   Diabetes mellitus with hyperglycemia ([place]) 06/08/2021   Uncontrolled type 2 diabetes mellitus with hyperglycemia ([place]) 06/08/2021   Nausea 06/08/2021   Hyperlipidemia 06/08/2021   History of COVID-19 03/19/2021   Tachycardia 03/19/2021   Palpitations 03/19/2021   Cough 03/19/2021   Complete tear of right ACL, initial encounter 12/10/2020   Acute tear lateral meniscus, right, initial encounter 08/29/2019   Acute medial meniscus tear of right knee 08/22/2019   Fever and chills 11/17/2018   Closed fracture of upper end of fibula 09/21/2018   WPW (Wolff-Parkinson-White syndrome) 10/10/2017   Asthma with acute exacerbation 09/15/2016   Hyperglycemia 09/15/2016   History of Wolff-Parkinson-White (WPW) syndrome 09/15/2016   Acute respiratory failure with hypoxia ([place]) 09/15/2016   Bronchospasm 09/11/2016    Past Surgical History:  Procedure  Laterality Date   ABDOMINAL HYSTERECTOMY     CARDIAC SURGERY     CESAREAN SECTION     KNEE ARTHROSCOPY WITH LATERAL MENISECTOMY Right 08/29/2019   Procedure: PARTIAL LATERAL MENISECTOMY;  Surgeon: Leandrew Koyanagi, MD;  Location: [facility];  Service: Orthopedics;  Laterality: Right;   KNEE ARTHROSCOPY WITH MEDIAL MENISECTOMY Right 08/29/2019   Procedure: RIGHT KNEE ARTHROSCOPY WITH PARTIAL MEDIAL MENISECTOMY;  Surgeon: Leandrew Koyanagi, MD;  Location: [facility];  Service: Orthopedics;  Laterality: Right;     OB History   No obstetric history on file.     Family History  Problem Relation Age of Onset   Hypertension Mother    CAD Mother 36   Diabetes Mother    Hypertension Father    CAD Father 31       CABG    Social History   Tobacco Use   Smoking status: Former    Packs/day: 0.10    Types: Cigarettes   Smokeless tobacco: Never  Vaping Use   Vaping Use: Never used  Substance Use Topics   Alcohol use: No   Drug use: No    Home Medications Prior to Admission medications   Medication Sig Start Date End Date Taking? Authorizing Provider  atorvastatin (LIPITOR) 20 MG tablet Take 1 tablet (20 mg total) by mouth daily. 05/25/21   Gildardo Pounds, NP  Blood Glucose Monitoring Suppl (ONETOUCH VERIO REFLECT) w/Device KIT USE TO CHECK BLOOD SUGAR 3 TIMES DAILY 12/10/20 12/10/21  Newlin,  Enobong, MD  Blood Glucose Monitoring Suppl (ONETOUCH VERIO) w/Device KIT Use to check blood sugar TID. 12/10/20   Charlott Rakes, MD  fluticasone (FLONASE) 50 MCG/ACT nasal spray PLACE 2 SPRAYS INTO BOTH NOSTRILS DAILY. Patient not taking: No sig reported 12/29/20 12/29/21  Scot Jun, FNP  gabapentin (NEURONTIN) 300 MG capsule TAKE 2 CAPSULES (600 MG TOTAL) BY MOUTH 3 (THREE) TIMES DAILY. 05/25/21 05/25/22  Gildardo Pounds, NP  glucose blood test strip USE TO CHECK BLOOD SUGAR 3 TIMES DAILY 05/25/21 05/25/22  Gildardo Pounds, NP  hydrOXYzine (VISTARIL) 25 MG capsule Take  1 capsule (25 mg total) by mouth every 8 (eight) hours as needed. 06/08/21   Gildardo Pounds, NP  insulin glargine-yfgn (SEMGLEE) 100 UNIT/ML Pen Inject 20 Units into the skin daily. 05/25/21   Gildardo Pounds, NP  Insulin Pen Needle 31G X 5 MM MISC USE AS INSTRUCTED. CHECK BLOOD GLUCOSE LEVEL BY FINGERSTICK TWICE PER DAY. 12/09/20 12/09/21  Gildardo Pounds, NP  ipratropium-albuterol (DUONEB) 0.5-2.5 (3) MG/3ML SOLN Take 3 mLs by nebulization every 6 (six) hours as needed (wheezing).    [provider]  Lancets (ONETOUCH DELICA PLUS [id number]) MISC USE TO CHECK BLOOD SUGAR 3 TIMES DAILY 12/10/20 12/10/21  Charlott Rakes, MD  metFORMIN (GLUCOPHAGE) 500 MG tablet Take 1 tablet (500 mg total) by mouth 2 (two) times daily with a meal. Patient not taking: Reported on 05/18/2021 12/09/20 03/09/21  Gildardo Pounds, NP  OneTouch Delica Lancets 58N MISC Use to check blood sugar TID. 12/10/20   Charlott Rakes, MD  predniSONE (DELTASONE) 10 MG tablet Take 1 tablet (10 mg total) by mouth daily with breakfast for 5 days. 06/04/21 06/13/21  Gildardo Pounds, NP  sertraline (ZOLOFT) 100 MG tablet TAKE 1 TABLET (100 MG TOTAL) BY MOUTH DAILY. 05/25/21 05/25/22  Gildardo Pounds, NP  topiramate (TOPAMAX) 50 MG tablet TAKE 1 TABLET (50 MG TOTAL) BY MOUTH DAILY. Patient taking differently: Take 50 mg by mouth daily as needed (migraine). 12/09/20 12/09/21  Gildardo Pounds, NP  triamcinolone cream (KENALOG) 0.1 % Apply 1 application topically 2 (two) times daily. 06/08/21   Enrique Sack, FNP  insulin glargine (LANTUS) 100 unit/mL SOPN Inject 20 Units into the skin daily.  05/25/21  [provider]  metoCLOPramide (REGLAN) 10 MG tablet Take 1 tablet (10 mg total) by mouth every 8 (eight) hours as needed for nausea or vomiting (headache). Patient not taking: Reported on 07/14/2020 07/07/20 07/17/20  Petrucelli, Glynda Jaeger, PA-C  traZODone (DESYREL) 50 MG tablet Take 0.5-1 tablets (25-50 mg total) by mouth at bedtime as  needed for sleep. 06/04/19 07/13/19  Fulp, Ander Gaster, MD    Allergies    Ciprofloxacin, Lyrica cr [pregabalin er], Flecainide, Latex, and Penicillin g  Review of Systems   Review of Systems  Constitutional:  Positive for activity change, appetite change and fatigue. Negative for chills and diaphoresis.  HENT: Negative.    Eyes: Negative.   Respiratory:  Positive for shortness of breath. Negative for cough.   Cardiovascular:  Positive for palpitations. Negative for chest pain and leg swelling.  Gastrointestinal:  Positive for nausea. Negative for abdominal pain, diarrhea, rectal pain and vomiting.  Genitourinary: Negative.   Musculoskeletal: Negative.   Neurological:  Positive for weakness.   Physical Exam Updated Vital Signs BP 129/85   Pulse (!) 103   Temp 99.3 F (37.4 C) (Oral)   Resp 19   Ht _0  (1.676 m)   Wt 88.5  kg   SpO2 93%   BMI 31.47 kg/m   Physical Exam Vitals and nursing note reviewed.  Constitutional:      Appearance: She is obese. She is not toxic-appearing.  HENT:     Head: Normocephalic and atraumatic.     Nose: Nose normal. No congestion.     Mouth/Throat:     Mouth: Mucous membranes are moist.     Pharynx: Oropharynx is clear. Uvula midline. No oropharyngeal exudate or posterior oropharyngeal erythema.     Tonsils: No tonsillar exudate.  Eyes:     General: Lids are normal. Vision grossly intact.        Right eye: No discharge.        Left eye: No discharge.     Extraocular Movements: Extraocular movements intact.     Conjunctiva/sclera: Conjunctivae normal.     Pupils: Pupils are equal, round, and reactive to light.  Neck:     Trachea: Trachea and phonation normal.  Cardiovascular:     Rate and Rhythm: Regular rhythm. Tachycardia present.     Pulses: Normal pulses.     Heart sounds: Normal heart sounds.  Pulmonary:     Effort: Pulmonary effort is normal. No tachypnea, bradypnea, accessory muscle usage, prolonged expiration or respiratory  distress.     Breath sounds: Normal breath sounds. No wheezing or rales.  Chest:     Chest wall: No mass, lacerations, deformity, swelling, tenderness, crepitus or edema.  Abdominal:     General: Bowel sounds are normal. There is no distension.     Palpations: Abdomen is soft.     Tenderness: There is no abdominal tenderness. There is no right CVA tenderness, left CVA tenderness, guarding or rebound.  Musculoskeletal:        General: No deformity.     Cervical back: Normal range of motion and neck supple. No edema, rigidity or crepitus. No pain with movement, spinous process tenderness or muscular tenderness.     Right lower leg: No edema.     Left lower leg: No edema.  Lymphadenopathy:     Cervical: No cervical adenopathy.  Skin:    General: Skin is warm and dry.     Capillary Refill: Capillary refill takes less than 2 seconds.     Findings: Rash present. Rash is scaling.     Comments: Scaling erythematous rash on the lower extremities.  Neurological:     General: No focal deficit present.     Mental Status: She is alert and oriented to person, place, and time. Mental status is at baseline.     Gait: Gait is intact.  Psychiatric:        Mood and Affect: Mood normal.    ED Results / Procedures / Treatments   Labs (all labs ordered are listed, but only abnormal results are displayed) Labs Reviewed  CBC - Abnormal; Notable for the following components:      Result Value   Platelets 488 (*)    All other components within normal limits  COMPREHENSIVE METABOLIC PANEL - Abnormal; Notable for the following components:   Sodium 134 (*)    Chloride 95 (*)    Glucose, Bld 569 (*)    Total Protein 8.4 (*)    All other components within normal limits  BETA-HYDROXYBUTYRIC ACID - Abnormal; Notable for the following components:   Beta-Hydroxybutyric Acid 0.28 (*)    All other components within normal limits  URINALYSIS, ROUTINE W REFLEX MICROSCOPIC - Abnormal; Notable for the following  components:   Glucose, UA >=500 (*)    Hgb urine dipstick SMALL (*)    Ketones, ur 5 (*)    Bacteria, UA RARE (*)    All other components within normal limits  CBG MONITORING, ED - Abnormal; Notable for the following components:   Glucose-Capillary 528 (*)    All other components within normal limits  CBG MONITORING, ED - Abnormal; Notable for the following components:   Glucose-Capillary 474 (*)    All other components within normal limits  CBG MONITORING, ED - Abnormal; Notable for the following components:   Glucose-Capillary 455 (*)    All other components within normal limits  CBG MONITORING, ED - Abnormal; Notable for the following components:   Glucose-Capillary 321 (*)    All other components within normal limits  RESP PANEL BY RT-PCR (FLU A&B, COVID) ARPGX2  URINE CULTURE  LIPASE, BLOOD  MAGNESIUM  BLOOD GAS, VENOUS  D-DIMER, QUANTITATIVE  OSMOLALITY  I-STAT BETA HCG BLOOD, ED ([facility], [facility], [facility] ONLY)  TROPONIN I (HIGH SENSITIVITY)  TROPONIN I (HIGH SENSITIVITY)    EKG EKG Interpretation  Date/Time:  Monday June 08 2021 19:35:11 EDT Ventricular Rate:  114 PR Interval:  129 QRS Duration: 85 QT Interval:  306 QTC Calculation: 422 R Axis:   30 Text Interpretation: Sinus tachycardia Probable left atrial enlargement Abnormal T, consider ischemia, lateral leads No significant change since last tracing Confirmed by Aletta Edouard [id number]) on 06/08/2021 7:58:56 PM  Radiology DG Chest 2 View  Result Date: 06/08/2021 CLINICAL DATA:  Chest pain EXAM: CHEST - 2 VIEW COMPARISON:  05/18/2021 FINDINGS: Lungs are clear.  No pleural effusion or pneumothorax. The heart is normal in size. Visualized osseous structures are within normal limits. Median sternotomy. IMPRESSION: Normal chest radiographs. Electronically Signed   By: Julian Hy M.D.   On: 06/08/2021 19:59    Procedures Procedures   Medications Ordered in ED Medications  insulin regular, human (MYXREDLIN) 100  units/ 100 mL infusion (8.5 Units/hr Intravenous Rate/Dose Change 06/09/21 0008)  dextrose 5 % in lactated ringers infusion (0 mLs Intravenous Hold 06/08/21 2243)  dextrose 50 % solution 0-50 mL (has no administration in time range)  lactated ringers infusion ( Intravenous New Bag/Given 06/08/21 2245)  lactated ringers bolus 1,000 mL (0 mLs Intravenous Stopped 06/08/21 2244)  ondansetron (ZOFRAN) injection 4 mg (4 mg Intravenous Given 06/08/21 2123)    ED Course  I have reviewed the triage vital signs and the nursing notes.  Pertinent labs & imaging results that were available during my care of the patient were reviewed by me and considered in my medical decision making (see chart for details).  Clinical Course as of 06/09/21 0030  Mon Jun 08, 2021  2224 Consult to hospitalist, Dr. Tonie Griffith, who is agreeable to admitting this patient to his service.  At his request we will add on D-dimer. [RS]    Clinical Course User Index [RS] Javen Ridings, Gypsy Balsam, PA-C   MDM Rules/Calculators/A&P                         45 year old female presents with concern for weakness, nausea, and elevated blood sugars.  Tachycardic on intake, vital signs otherwise normal.  CBG in triage elevated to 528.   Differential diagnosis includes limited to nonketotic hyperglycemia, DKA, HHS, other metabolic derangement, dehydration, sepsis.  Cardiac exam significant for tachycardia with regular rhythm, pulmonary exam is unremarkable, abdominal exam is benign.  Patient is neurovascularly intact in  all 4 extremities.  CBC unremarkable, CMP with hyperglycemia of 569, hyponatremia of 134, however when corrected 142.  Beta hydroxy elevated to 0.28, very mild.  VBG without acidosis with pH of 7.355.  Respiratory pathogen panel negative.  D-dimer added at the request of hospitalist, negative.  Patient started on Endo tool for hyperglycemia and admitted to hospitalist as above.  Karlita voiced understanding medical evaluation and  treatment plan.  Each of her questions was answered to her expressed satisfaction.  She is amenable to plan for admission at this time.  This chart was dictated using voice recognition software, Dragon. Despite the best efforts of this provider to proofread and correct errors, errors may still occur which can change documentation meaning.  Final Clinical Impression(s) / ED Diagnoses Final diagnoses:  None    Rx / DC Orders ED Discharge Orders     None        Aura Dials 06/09/21 0030    Hayden Rasmussen, MD 06/09/21 [phone]

## 2021-06-08 NOTE — Telephone Encounter (Signed)
Blood 550 over/ prednizone shot earlier in ED and Pt has no short term insulin. [phone] Really bad ache/ heart racing   Called patient back to review symptoms no answer, LVMTCB.

## 2021-06-08 NOTE — ED Triage Notes (Addendum)
Pt arrived via EMS complaining of hyperglycemia. Pt is  type 2 diabetic and states that she has felt clammy for the past few weeks and has not had a sugar reading under 300 since then. Pt has hx of wolfe parkinson white syndrome and states she has been having shortness of breath and chest pain

## 2021-06-08 NOTE — Discharge Instructions (Addendum)
Your rash is likely due to a dermatitis caused by either the new laundry detergent you've been using or the antibiotic you took last week. There is no way of knowing exactly which one it was. You have stopped the antibiotic and I advise that you don't take doxycycline again. Change your laundry detergent back to the one you were using before. We gave you a small dose of steroids here. You will not be given oral steroids due to your history of diabetes and high sugars. Continue to monitor your glucose in the morning, after meals and at bedtime. Go to the ED immediately if your blood sugar monitor reads "HI" and/or if you develop excessive thirst, fatigue, loss of appetite, weakness, nausea, vomiting, dry mouth, fruity-scented breath, shortness of breath, frequent urination, blurred vision or confusion.

## 2021-06-08 NOTE — Telephone Encounter (Signed)
Called patient again to review symptoms. No answer, recording states call can not be completed at this time , hand up and Try call again later.   Called patient's sister , emergency contact, Maureen Chatters due to patient not answering phone and symptoms urgent. Patient's sister went to patient to review symptoms and patient is alert and able to report feeling heart racing and pains in neck. Blood glucose now 541 and patient's sister reports patient's blood glucose has been in the 500's this week and is very concerned patient does not have insulin to bring BG down. Patient's sister reports she is calling 911 now.    Reason for Disposition  Blood glucose > 400 mg/dL (22.2 mmol/L)  Answer Assessment - Initial Assessment Questions 1. BLOOD GLUCOSE: "What is your blood glucose level?"      541 now and was 550 approx 30 minutes ago  2. ONSET: "When did you check the blood glucose?"     Now  3. USUAL RANGE: "What is your glucose level usually?" (e.g., usual fasting morning value, usual evening value)     Na  4. KETONES: "Do you check for ketones (urine or blood test strips)?" If yes, ask: "What does the test show now?"      na 5. TYPE 1 or 2:  "Do you know what type of diabetes you have?"  (e.g., Type 1, Type 2, Gestational; doesn't know)      na 6. INSULIN: "Do you take insulin?" "What type of insulin(s) do you use? What is the mode of delivery? (syringe, pen; injection or pump)?"      No short acting insulin per sister 7. DIABETES PILLS: "Do you take any pills for your diabetes?" If yes, ask: "Have you missed taking any pills recently?"     na 8. OTHER SYMPTOMS: "Do you have any symptoms?" (e.g., fever, frequent urination, difficulty breathing, dizziness, weakness, vomiting)     Heart racing and neck pain  9. PREGNANCY: "Is there any chance you are pregnant?" "When was your last menstrual period?"     na  Protocols used: Diabetes - High Blood Sugar-A-AH

## 2021-06-08 NOTE — ED Provider Notes (Signed)
Emergency Medicine Provider Triage Evaluation Note  Kolina Normalinda Noblitt , a 45 y.o. female  was evaluated in triage.  Pt complains of hyperglycemia x1 month.  Of note, pt received an injection of solumedrol today at her pcp office  Review of Systems  Positive: Hyperglycemia, cp, sob, abd pain Negative: fever  Physical Exam  BP 130/86 (BP Location: Left Arm)   Pulse (!) 115   Temp 99.3 F (37.4 C) (Oral)   Resp 18   Ht '5\' 6"'$  (1.676 m)   Wt 88.5 kg   SpO2 98%   BMI 31.47 kg/m  Gen:   Awake, no distress   Resp:  Normal effort  MSK:   Moves extremities without difficulty   Medical Decision Making  Medically screening exam initiated at 7:35 PM.  Appropriate orders placed.  Jayleen Dawn Buracker was informed that the remainder of the evaluation will be completed by another provider, this initial triage assessment does not replace that evaluation, and the importance of remaining in the ED until their evaluation is complete.     Bishop Dublin 06/08/21 1938    Tegeler, Gwenyth Allegra, MD 06/09/21 [phone]

## 2021-06-09 ENCOUNTER — Other Ambulatory Visit: Payer: Self-pay

## 2021-06-09 DIAGNOSIS — E1165 Type 2 diabetes mellitus with hyperglycemia: Principal | ICD-10-CM

## 2021-06-09 LAB — BASIC METABOLIC PANEL
Anion gap: 8 (ref 5–15)
Anion gap: 8 (ref 5–15)
BUN: 16 mg/dL (ref 6–20)
BUN: 13 mg/dL (ref 6–20)
CO2: 26 mmol/L (ref 22–32)
CO2: 27 mmol/L (ref 22–32)
Calcium: 9.7 mg/dL (ref 8.9–10.3)
Calcium: 9.3 mg/dL (ref 8.9–10.3)
Chloride: 105 mmol/L (ref 98–111)
Chloride: 104 mmol/L (ref 98–111)
Creatinine, Ser: 0.73 mg/dL (ref 0.44–1.00)
Creatinine, Ser: 0.85 mg/dL (ref 0.44–1.00)
GFR, Estimated: >60 mL/min (ref >60)
GFR, Estimated: >60 mL/min (ref >60)
Glucose, Bld: 173 mg/dL — ABNORMAL HIGH (ref 70–99)
Glucose, Bld: 161 mg/dL — ABNORMAL HIGH (ref 70–99)
Potassium: 3.9 mmol/L (ref 3.5–5.1)
Potassium: 3.6 mmol/L (ref 3.5–5.1)
Sodium: 139 mmol/L (ref 135–145)
Sodium: 139 mmol/L (ref 135–145)

## 2021-06-09 LAB — GLUCOSE, CAPILLARY
Glucose-Capillary: 197 mg/dL — ABNORMAL HIGH (ref 70–99)
Glucose-Capillary: 166 mg/dL — ABNORMAL HIGH (ref 70–99)
Glucose-Capillary: 168 mg/dL — ABNORMAL HIGH (ref 70–99)
Glucose-Capillary: 167 mg/dL — ABNORMAL HIGH (ref 70–99)
Glucose-Capillary: 145 mg/dL — ABNORMAL HIGH (ref 70–99)
Glucose-Capillary: 247 mg/dL — ABNORMAL HIGH (ref 70–99)
Glucose-Capillary: 247 mg/dL — ABNORMAL HIGH (ref 70–99)

## 2021-06-09 LAB — HEMOGLOBIN A1C
Hgb A1c MFr Bld: 10.1 % — ABNORMAL HIGH (ref 4.8–5.6)
Mean Plasma Glucose: 243.17 mg/dL

## 2021-06-09 LAB — OSMOLALITY: Osmolality: 315 mOsm/kg — ABNORMAL HIGH (ref 275–295)

## 2021-06-09 LAB — HIV ANTIBODY (ROUTINE TESTING W REFLEX): HIV Screen 4th Generation wRfx: NONREACTIVE

## 2021-06-09 LAB — MRSA NEXT GEN BY PCR, NASAL: MRSA by PCR Next Gen: NOT DETECTED

## 2021-06-09 LAB — CBG MONITORING, ED
Glucose-Capillary: 237 mg/dL — ABNORMAL HIGH (ref 70–99)
Glucose-Capillary: 243 mg/dL — ABNORMAL HIGH (ref 70–99)
Glucose-Capillary: 321 mg/dL — ABNORMAL HIGH (ref 70–99)

## 2021-06-09 MED ORDER — IPRATROPIUM-ALBUTEROL 0.5-2.5 (3) MG/3ML IN SOLN: 3.0000 mL | Freq: Four times a day (QID) | RESPIRATORY_TRACT | Status: DC | PRN

## 2021-06-09 MED ORDER — INSULIN ASPART 100 UNIT/ML IJ SOLN
4.0000 [IU] | Freq: Three times a day (TID) | INTRAMUSCULAR | Status: DC
  Administered 2021-06-09: 4 [IU] via SUBCUTANEOUS

## 2021-06-09 MED ORDER — TRUEPLUS PEN NEEDLES 31G X 5 MM MISC
11 refills | Status: DC
  Filled 2021-06-09: qty 100, 30d supply, fill #0
  Filled 2021-06-11: qty 100, 33d supply, fill #0

## 2021-06-09 MED ORDER — INSULIN ASPART 100 UNIT/ML IJ SOLN: 0.0000 [IU] | Freq: Every day | INTRAMUSCULAR | Status: DC

## 2021-06-09 MED ORDER — INSULIN ASPART 100 UNIT/ML IJ SOLN
0.0000 [IU] | Freq: Three times a day (TID) | INTRAMUSCULAR | Status: DC
  Administered 2021-06-09: 5 [IU] via SUBCUTANEOUS
  Administered 2021-06-09: 2 [IU] via SUBCUTANEOUS

## 2021-06-09 MED ORDER — NYSTATIN 100000 UNIT/GM EX POWD
Freq: Two times a day (BID) | CUTANEOUS | Status: DC | PRN
  Filled 2021-06-09: qty 15

## 2021-06-09 MED ORDER — DEXTROSE 50 % IV SOLN: 0.0000 mL | INTRAVENOUS | Status: DC | PRN

## 2021-06-09 MED ORDER — INSULIN GLARGINE-YFGN 100 UNIT/ML ~~LOC~~ SOLN
10.0000 [IU] | SUBCUTANEOUS | Status: AC
  Administered 2021-06-09: 10 [IU] via SUBCUTANEOUS
  Filled 2021-06-09: qty 0.1

## 2021-06-09 MED ORDER — LACTATED RINGERS IV SOLN: INTRAVENOUS | Status: DC

## 2021-06-09 MED ORDER — NYSTATIN 100000 UNIT/GM EX CREA
TOPICAL_CREAM | Freq: Two times a day (BID) | CUTANEOUS | 0 refills | Status: DC | PRN
  Filled 2021-06-09: qty 30, 15d supply, fill #0

## 2021-06-09 MED ORDER — INSULIN PEN NEEDLE 31G X 5 MM MISC
0 refills | Status: DC
  Filled 2021-06-09: qty 100, 25d supply, fill #0

## 2021-06-09 MED ORDER — ATORVASTATIN CALCIUM 10 MG PO TABS
20.0000 mg | ORAL_TABLET | Freq: Every day | ORAL | Status: DC
  Administered 2021-06-09: 20 mg via ORAL
  Filled 2021-06-09: qty 2

## 2021-06-09 MED ORDER — TRAMADOL HCL 50 MG PO TABS
25.0000 mg | ORAL_TABLET | Freq: Once | ORAL | Status: AC
  Administered 2021-06-09: 25 mg via ORAL
  Filled 2021-06-09: qty 1

## 2021-06-09 MED ORDER — INSULIN GLARGINE-YFGN 100 UNIT/ML ~~LOC~~ SOLN: 20.0000 [IU] | Freq: Every day | SUBCUTANEOUS | Status: DC

## 2021-06-09 MED ORDER — GABAPENTIN 300 MG PO CAPS
600.0000 mg | ORAL_CAPSULE | Freq: Three times a day (TID) | ORAL | Status: DC
  Administered 2021-06-09 (×2): 600 mg via ORAL
  Filled 2021-06-09 (×2): qty 2

## 2021-06-09 MED ORDER — NYSTATIN 100000 UNIT/GM EX CREA
TOPICAL_CREAM | Freq: Two times a day (BID) | CUTANEOUS | Status: DC | PRN
  Filled 2021-06-09: qty 15

## 2021-06-09 MED ORDER — ACETAMINOPHEN 325 MG PO TABS
650.0000 mg | ORAL_TABLET | Freq: Four times a day (QID) | ORAL | Status: DC | PRN
  Administered 2021-06-09: 650 mg via ORAL
  Filled 2021-06-09: qty 2

## 2021-06-09 MED ORDER — INSULIN ASPART 100 UNIT/ML IJ SOLN
4.0000 [IU] | Freq: Three times a day (TID) | INTRAMUSCULAR | 11 refills | Status: DC
  Filled 2021-06-09 (×2): qty 10, 84d supply, fill #0

## 2021-06-09 MED ORDER — INSULIN LISPRO (1 UNIT DIAL) 100 UNIT/ML (KWIKPEN)
PEN_INJECTOR | SUBCUTANEOUS | 11 refills | Status: DC
Start: 2021-06-09 — End: 2021-08-18
  Filled 2021-06-09: qty 15, 33d supply, fill #0
  Filled 2021-06-09: qty 15, 26d supply, fill #0

## 2021-06-09 MED ORDER — INSULIN REGULAR(HUMAN) IN NACL 100-0.9 UT/100ML-% IV SOLN
INTRAVENOUS | Status: DC
  Administered 2021-06-09: 6 [IU]/h via INTRAVENOUS

## 2021-06-09 MED ORDER — SERTRALINE HCL 100 MG PO TABS
100.0000 mg | ORAL_TABLET | Freq: Every day | ORAL | Status: DC
  Administered 2021-06-09: 100 mg via ORAL
  Filled 2021-06-09: qty 1

## 2021-06-09 MED ORDER — METFORMIN HCL 500 MG PO TABS
500.0000 mg | ORAL_TABLET | Freq: Two times a day (BID) | ORAL | Status: DC
  Administered 2021-06-09: 500 mg via ORAL
  Filled 2021-06-09 (×2): qty 1

## 2021-06-09 MED ORDER — TOPIRAMATE 50 MG PO TABS: 50.0000 mg | ORAL_TABLET | Freq: Every day | ORAL | Status: DC | PRN

## 2021-06-09 MED ORDER — INSULIN ASPART 100 UNIT/ML IJ SOLN: 4.0000 [IU] | Freq: Three times a day (TID) | INTRAMUSCULAR | Status: DC

## 2021-06-09 MED ORDER — ONDANSETRON HCL 4 MG/2ML IJ SOLN
4.0000 mg | Freq: Four times a day (QID) | INTRAMUSCULAR | Status: DC | PRN
  Administered 2021-06-09: 4 mg via INTRAVENOUS
  Filled 2021-06-09: qty 2

## 2021-06-09 MED ORDER — CHLORHEXIDINE GLUCONATE CLOTH 2 % EX PADS: 6.0000 | MEDICATED_PAD | Freq: Every day | CUTANEOUS | Status: DC

## 2021-06-09 MED ORDER — ONDANSETRON HCL 4 MG PO TABS: 4.0000 mg | ORAL_TABLET | Freq: Four times a day (QID) | ORAL | Status: DC | PRN

## 2021-06-09 MED ORDER — DEXTROSE IN LACTATED RINGERS 5 % IV SOLN: INTRAVENOUS | Status: DC

## 2021-06-09 NOTE — ED Notes (Signed)
ED TO INPATIENT HANDOFF REPORT  Name/Age/Gender Michelle Gibson 45 y.o. female  Code Status    Code Status Orders  (From admission, onward)         Start     Ordered   06/09/21 0100  Full code  Continuous        06/09/21 0059        Code Status History    Date Active Date Inactive Code Status Order ID Comments User Context   09/11/2016 0737 09/19/2016 1637 Full Code [id number]  [facility]      Home/SNF/Other Home  Chief Complaint Diabetes mellitus with hyperglycemia ([place]) [E11.65]  Level of Care/Admitting Diagnosis ED Disposition    ED Disposition  Admit   Condition  --   [place]: [facility] [[id number]]  Level of Care: Stepdown [14]  Admit to SDU based on following criteria: Severe physiological/psychological symptoms:  Any diagnosis requiring assessment & intervention at least every 4 hours on an ongoing basis to obtain desired patient outcomes including stability and rehabilitation  May admit patient to [facility] or [facility] if equivalent level of care is available:: Yes  Covid Evaluation: Asymptomatic Screening Protocol (No Symptoms)  Diagnosis: Diabetes mellitus with hyperglycemia [facility]) [id number]  Admitting Physician: Eben Burow [id number]  Attending Physician: Eben Burow [id number]  Estimated length of stay: past midnight tomorrow  Certification:: I certify this patient will need inpatient services for at least 2 midnights         Medical History Past Medical History:  Diagnosis Date  . Acute tear lateral meniscus, right, initial encounter 08/29/2019  . Asthma   . Closed fracture of upper end of fibula 09/21/2018  . Diabetes mellitus without complication ([place])    TYPE II  . Hyperglycemia 09/15/2016  . Migraines   . WPW (Wolff-Parkinson-White syndrome)    s/p ablation at age 5    Allergies Allergies  Allergen Reactions  . Ciprofloxacin Hives  . Lyrica Cr [Pregabalin Er] Other  (See Comments)    Suicidal ideation  . Doxycycline Rash    Yeast infection  . Flecainide Hives, Nausea And Vomiting and Rash  . Latex Rash and Other (See Comments)    Causes blisters, also  . Penicillin G Rash    Has patient had a PCN reaction causing immediate rash, facial/tongue/throat swelling, SOB or lightheadedness with hypotension: yes Has patient had a PCN reaction causing severe rash involving mucus membranes or skin necrosis: yes Has patient had a PCN reaction that required hospitalization: no Has patient had a PCN reaction occurring within the last 10 years: no If all of the above answers are "NO", then may proceed with Cephalosporin use.     IV Location/Drains/Wounds Patient Lines/Drains/Airways Status    Active Line/Drains/Airways    Name Placement date Placement time Site Days   Peripheral IV 06/08/21 20 G Right Forearm 06/08/21  2025  Forearm  1   Peripheral IV 06/09/21 22 G Left;Anterior Forearm 06/09/21  0133  Forearm  less than 1   Incision (Closed) 08/29/19 Knee Right 08/29/19  0907  -- 650          Labs/Imaging Results for orders placed or performed during the hospital encounter of 06/08/21 (from the past 48 hour(s))  CBG monitoring, ED     Status: Abnormal   Collection Time: 06/08/21  7:28 PM  Result Value Ref Range   Glucose-Capillary 528 (HH) 70 - 99 mg/dL    Comment:  Glucose reference range applies only to samples taken after fasting for at least 8 hours.   Comment 1 Notify RN   CBC     Status: Abnormal   Collection Time: 06/08/21  8:25 PM  Result Value Ref Range   WBC 8.1 4.0 - 10.5 K/uL   RBC 4.84 3.87 - 5.11 MIL/uL   Hemoglobin 14.6 12.0 - 15.0 g/dL   HCT 42.9 36.0 - 46.0 %   MCV 88.6 80.0 - 100.0 fL   MCH 30.2 26.0 - 34.0 pg   MCHC 34.0 30.0 - 36.0 g/dL   RDW 12.7 11.5 - 15.5 %   Platelets 488 (H) 150 - 400 K/uL   nRBC 0.0 0.0 - 0.2 %    Comment: Performed at [facility], [place] [address] [place], [place] [address]   Troponin I (High Sensitivity)     Status: None   Collection Time: 06/08/21  8:25 PM  Result Value Ref Range   Troponin I (High Sensitivity) 2 <18 ng/L    Comment: (NOTE) Elevated high sensitivity troponin I (hsTnI) values and significant  changes across serial measurements may suggest ACS but many other  chronic and acute conditions are known to elevate hsTnI results.  Refer to the "Links" section for chest pain algorithms and additional  guidance. Performed at [facility], [place] [address] [place], [place] [address]   Comprehensive metabolic panel     Status: Abnormal   Collection Time: 06/08/21  8:25 PM  Result Value Ref Range   Sodium 134 (L) 135 - 145 mmol/L   Potassium 4.7 3.5 - 5.1 mmol/L   Chloride 95 (L) 98 - 111 mmol/L   CO2 25 22 - 32 mmol/L   Glucose, Bld 569 (HH) 70 - 99 mg/dL    Comment: Glucose reference range applies only to samples taken after fasting for at least 8 hours. CRITICAL RESULT CALLED TO, READ BACK BY AND VERIFIED WITH: [facility] AT 2131 ON 06/08/2021 BY MOSLEY,J    BUN 20 6 - 20 mg/dL   Creatinine, Ser 0.99 0.44 - 1.00 mg/dL   Calcium 9.5 8.9 - 10.3 mg/dL   Total Protein 8.4 (H) 6.5 - 8.1 g/dL   Albumin 4.1 3.5 - 5.0 g/dL   AST 36 15 - 41 U/L   ALT 23 0 - 44 U/L   Alkaline Phosphatase 91 38 - 126 U/L   Total Bilirubin 0.7 0.3 - 1.2 mg/dL   GFR, Estimated >60 >60 mL/min    Comment: (NOTE) Calculated using the CKD-EPI Creatinine Equation (2021)    Anion gap 14 5 - 15    Comment: Performed at [facility], [place] [address], [place], [place] [address]  Lipase, blood     Status: None   Collection Time: 06/08/21  8:25 PM  Result Value Ref Range   Lipase 49 11 - 51 U/L    Comment: Performed at [facility], [place] [address], [place], [place] [address]  Beta-hydroxybutyric acid     Status: Abnormal   Collection Time: 06/08/21  8:25 PM  Result Value Ref Range   Beta-Hydroxybutyric Acid 0.28 (H) 0.05  - 0.27 mmol/L    Comment: Performed at [facility], [place] [address], [place], [place] [address]  Osmolality     Status: Abnormal   Collection Time: 06/08/21  8:25 PM  Result Value Ref Range   Osmolality 315 (H) 275 - 295 mOsm/kg    Comment: Performed at [facility], [place] [address], [place], [place] [address]  Magnesium     Status: None   Collection Time: 06/08/21  8:25 PM  Result Value Ref Range   Magnesium 2.2 1.7 - 2.4 mg/dL    Comment: Performed at [facility], [place] [address] [place], [place] [address]  Blood gas, venous (at [facility] and [facility], not at [facility])     Status: None   Collection Time: 06/08/21  8:25 PM  Result Value Ref Range   FIO2 21.00    pH, Ven 7.355 7.250 - 7.430   pCO2, Ven 47.7 44.0 - 60.0 mmHg   pO2, Ven 45.0 32.0 - 45.0 mmHg   Bicarbonate 26.0 20.0 - 28.0 mmol/L   Acid-Base Excess 0.4 0.0 - 2.0 mmol/L   O2 Saturation 76.4 %   Patient temperature 98.6     Comment: Performed at [facility], [place] [address], [place], [place] [address]  Urinalysis, Routine w reflex microscopic Urine, Clean Catch     Status: Abnormal   Collection Time: 06/08/21  8:25 PM  Result Value Ref Range   Color, Urine YELLOW YELLOW   APPearance CLEAR CLEAR   Specific Gravity, Urine 1.027 1.005 - 1.030   pH 5.0 5.0 - 8.0   Glucose, UA >=500 (A) NEGATIVE mg/dL   Hgb urine dipstick SMALL (A) NEGATIVE   Bilirubin Urine NEGATIVE NEGATIVE   Ketones, ur 5 (A) NEGATIVE mg/dL   Protein, ur NEGATIVE NEGATIVE mg/dL   Nitrite NEGATIVE NEGATIVE   Leukocytes,Ua NEGATIVE NEGATIVE   RBC / HPF 0-5 0 - 5 RBC/hpf   WBC, UA 0-5 0 - 5 WBC/hpf   Bacteria, UA RARE (A) NONE SEEN   Squamous Epithelial / LPF 0-5 0 - 5   Mucus PRESENT     Comment: Performed at [facility], [place] [address] [place], [place] [address]  D-dimer, quantitative     Status: None   Collection Time: 06/08/21  8:25 PM  Result Value Ref Range   D-Dimer, Quant  <0.27 0.00 - 0.50 ug/mL-FEU    Comment: (NOTE) At the manufacturer cut-off value of 0.5 g/mL FEU, this assay has a negative predictive value of 95-100%.This assay is intended for use in conjunction with a clinical pretest probability (PTP) assessment model to exclude pulmonary embolism (PE) and deep venous thrombosis (DVT) in outpatients suspected of PE or DVT. Results should be correlated with clinical presentation. Performed at [facility], [place] [address], [place],  [address]   I-Stat beta hCG blood, ED     Status: None   Collection Time: 06/08/21  8:31 PM  Result Value Ref Range   I-stat hCG, quantitative <5.0 <5 mIU/mL   Comment 3            Comment:   GEST. AGE      CONC.  (mIU/mL)   <=1 WEEK        5 - 50     2 WEEKS       50 - 500     3 WEEKS       100 - 10,000     4 WEEKS     1,000 - 30,000        FEMALE AND NON-PREGNANT FEMALE:     LESS THAN 5 mIU/mL   Troponin I (High Sensitivity)     Status: None   Collection Time: 06/08/21  9:22 PM  Result Value Ref Range   Troponin I (High Sensitivity) <  2 <18 ng/L    Comment: (NOTE) Elevated high sensitivity troponin I (hsTnI) values and significant  changes across serial measurements may suggest ACS but many other  chronic and acute conditions are known to elevate hsTnI results.  Refer to the "Links" section for chest pain algorithms and additional  guidance. Performed at [facility], [place] [address] [place], [place] [address]   Resp Panel by RT-PCR (Flu A&B, Covid) Nasopharyngeal Swab     Status: None   Collection Time: 06/08/21 10:40 PM   Specimen: Nasopharyngeal Swab; Nasopharyngeal(NP) swabs in vial transport medium  Result Value Ref Range   SARS Coronavirus 2 by RT PCR NEGATIVE NEGATIVE    Comment: (NOTE) SARS-CoV-2 target nucleic acids are NOT DETECTED.  The SARS-CoV-2 RNA is generally detectable in upper respiratory specimens during the acute phase of infection. The  lowest concentration of SARS-CoV-2 viral copies this assay can detect is 138 copies/mL. A negative result does not preclude SARS-Cov-2 infection and should not be used as the sole basis for treatment or other patient management decisions. A negative result may occur with  improper specimen collection/handling, submission of specimen other than nasopharyngeal swab, presence of viral mutation(s) within the areas targeted by this assay, and inadequate number of viral copies(<138 copies/mL). A negative result must be combined with clinical observations, patient history, and epidemiological information. The expected result is Negative.  Fact Sheet for Patients:  EntrepreneurPulse.com.au  Fact Sheet for Healthcare Providers:  IncredibleEmployment.be  This test is no t yet approved or cleared by the [place] FDA and  has been authorized for detection and/or diagnosis of SARS-CoV-2 by FDA under an Emergency Use Authorization (EUA). This EUA will remain  in effect (meaning this test can be used) for the duration of the COVID-19 declaration under Section 564(b)(1) of the Act, 21 U.S.C.section 360bbb-3(b)(1), unless the authorization is terminated  or revoked sooner.       Influenza A by PCR NEGATIVE NEGATIVE   Influenza B by PCR NEGATIVE NEGATIVE    Comment: (NOTE) The Xpert Xpress SARS-CoV-2/FLU/RSV plus assay is intended as an aid in the diagnosis of influenza from Nasopharyngeal swab specimens and should not be used as a sole basis for treatment. Nasal washings and aspirates are unacceptable for Xpert Xpress SARS-CoV-2/FLU/RSV testing.  Fact Sheet for Patients: EntrepreneurPulse.com.au  Fact Sheet for Healthcare Providers: IncredibleEmployment.be  This test is not yet approved or cleared by the [place] FDA and has been authorized for detection and/or diagnosis of SARS-CoV-2 by FDA under an Emergency  Use Authorization (EUA). This EUA will remain in effect (meaning this test can be used) for the duration of the COVID-19 declaration under Section 564(b)(1) of the Act, 21 U.S.C. section 360bbb-3(b)(1), unless the authorization is terminated or revoked.  Performed at [facility], [place] [address], [place], [place] [address]   CBG monitoring, ED     Status: Abnormal   Collection Time: 06/08/21 10:43 PM  Result Value Ref Range   Glucose-Capillary 474 (H) 70 - 99 mg/dL    Comment: Glucose reference range applies only to samples taken after fasting for at least 8 hours.  CBG monitoring, ED     Status: Abnormal   Collection Time: 06/08/21 11:28 PM  Result Value Ref Range   Glucose-Capillary 455 (H) 70 - 99 mg/dL    Comment: Glucose reference range applies only to samples taken after fasting for at least 8 hours.  CBG monitoring, ED     Status: Abnormal  Collection Time: 06/09/21 12:06 AM  Result Value Ref Range   Glucose-Capillary 321 (H) 70 - 99 mg/dL    Comment: Glucose reference range applies only to samples taken after fasting for at least 8 hours.  CBG monitoring, ED     Status: Abnormal   Collection Time: 06/09/21  1:17 AM  Result Value Ref Range   Glucose-Capillary 243 (H) 70 - 99 mg/dL    Comment: Glucose reference range applies only to samples taken after fasting for at least 8 hours.  CBG monitoring, ED     Status: Abnormal   Collection Time: 06/09/21  2:26 AM  Result Value Ref Range   Glucose-Capillary 237 (H) 70 - 99 mg/dL    Comment: Glucose reference range applies only to samples taken after fasting for at least 8 hours.   DG Chest 2 View  Result Date: 06/08/2021 CLINICAL DATA:  Chest pain EXAM: CHEST - 2 VIEW COMPARISON:  05/18/2021 FINDINGS: Lungs are clear.  No pleural effusion or pneumothorax. The heart is normal in size. Visualized osseous structures are within normal limits. Median sternotomy. IMPRESSION: Normal chest radiographs. Electronically  Signed   By: Julian Hy M.D.   On: 06/08/2021 19:59    Pending Labs Unresulted Labs (From admission, onward)    Start     Ordered   06/09/21 [id number]  Basic metabolic panel  (Hyperglycemia (not DKA or HHS))  Daily,   STAT      06/09/21 0059   06/09/21 0100  HIV Antibody (routine testing w rflx)  (HIV Antibody (Routine testing w reflex) panel)  Once,   STAT        06/09/21 0059   06/09/21 0100  Hemoglobin A1c  (Hyperglycemia (not DKA or HHS))  Once,   STAT       Comments: To assess prior glycemic control.    06/09/21 0059   06/08/21 2052  Urine Culture  Once,   STAT       Question:  Indication  Answer:  Dysuria   06/08/21 2051          Vitals/Pain Today's Vitals   06/08/21 2200 06/08/21 2240 06/08/21 2300 06/09/21 0130  BP: 120/74 118/83 129/85 (!) 124/102  Pulse: (!) 109 (!) 103 (!) 103 100  Resp: '12 18 19 16  '$ Temp:      TempSrc:      SpO2: 93% 95% 93% 95%  Weight:      Height:      PainSc:        Isolation Precautions Airborne and Contact precautions  Medications Medications  atorvastatin (LIPITOR) tablet 20 mg (has no administration in time range)  metFORMIN (GLUCOPHAGE) tablet 500 mg (has no administration in time range)  gabapentin (NEURONTIN) capsule 600 mg (has no administration in time range)  insulin regular, human (MYXREDLIN) 100 units/ 100 mL infusion (7.5 Units/hr Intravenous Rate/Dose Change 06/09/21 0228)  lactated ringers infusion (0 mLs Intravenous Hold 06/09/21 0119)  dextrose 5 % in lactated ringers infusion ( Intravenous Infusion Verify 06/09/21 0200)  dextrose 50 % solution 0-50 mL (has no administration in time range)  ondansetron (ZOFRAN) tablet 4 mg (has no administration in time range)    Or  ondansetron (ZOFRAN) injection 4 mg (has no administration in time range)  lactated ringers bolus 1,000 mL (0 mLs Intravenous Stopped 06/08/21 2244)  ondansetron (ZOFRAN) injection 4 mg (4 mg Intravenous Given 06/08/21 2123)    Mobility walks

## 2021-06-09 NOTE — Discharge Summary (Signed)
Physician Discharge Summary  Michelle Gibson UUE:[id number] DOB: Sep 28, 1976 DOA: 06/08/2021  PCP: Gildardo Pounds, NP  Admit date: 06/08/2021 Discharge date: 06/09/2021  Admitted From: home Discharge disposition: home   Recommendations for Outpatient Follow-Up:   Addition of SSI plus humalog with meals for better blood sugar control Patient to bring log to PCP   Discharge Diagnosis:   Principal Problem:   Uncontrolled type 2 diabetes mellitus with hyperglycemia ([place]) Active Problems:   Nausea   Hyperlipidemia    Discharge Condition: Improved.  Diet recommendation:  Carbohydrate-modified.    Wound care: None.  Code status: Full.   History of Present Illness:   Michelle Gibson is a 45 y.o. female with medical history significant for DMT2, HLD, migraine headaches, WPW, intermittent asthma who presents with complaint of high blood sugar for the past few weeks. She states her sugar has stayed over 300 at home despite taking her lantus and metformin. She reports she is following low carb diet.  Any recent fevers or illness.  She denies any chest pain.  She has intermittent shortness of breath with her asthma.    Hospital Course by Problem:   Uncontrolled type 2 diabetes mellitus with hyperglycemia  HgbA1c: 10  -blood sugars have been high for past few weeks despite lantus and metformin at home. -discussed with DM coordinator, will add humalog with meals (scheduled and SSI) -patient to bring log to PCP     Nausea -resolved     Hyperlipidemia Continue lipitor.    Medical Consultants:      Discharge Exam:   Vitals:   06/09/21 0835 06/09/21 1124  BP: 133/81   Pulse: 68   Resp: 12   Temp: 97.8 F (36.6 C) 97.7 F (36.5 C)  SpO2: 99%    Vitals:   06/09/21 0400 06/09/21 0500 06/09/21 0835 06/09/21 1124  BP: 115/75  133/81   Pulse: 82 72 68   Resp: '17 12 12   ' Temp:   97.8 F (36.6 C) 97.7 F (36.5 C)  TempSrc:   Oral Oral  SpO2: 94% 96% 99%    Weight:      Height:        General exam: Appears calm and comfortable.    The results of significant diagnostics from this hospitalization (including imaging, microbiology, ancillary and laboratory) are listed below for reference.     Procedures and Diagnostic Studies:   DG Chest 2 View  Result Date: 06/08/2021 CLINICAL DATA:  Chest pain EXAM: CHEST - 2 VIEW COMPARISON:  05/18/2021 FINDINGS: Lungs are clear.  No pleural effusion or pneumothorax. The heart is normal in size. Visualized osseous structures are within normal limits. Median sternotomy. IMPRESSION: Normal chest radiographs. Electronically Signed   By: Julian Hy M.D.   On: 06/08/2021 19:59     Labs:   Basic Metabolic Panel: Recent Labs  Lab 06/08/21 2025 06/09/21 0432 06/09/21 0749  NA 134* 139 139  K 4.7 3.9 3.6  CL 95* 105 104  CO2 '25 26 27  ' GLUCOSE 569* 173* 161*  BUN '20 16 13  ' CREATININE 0.99 0.73 0.85  CALCIUM 9.5 9.7 9.3  MG 2.2  --   --    GFR Estimated Creatinine Clearance: 96.6 mL/min (by C-G formula based on SCr of 0.85 mg/dL). Liver Function Tests: Recent Labs  Lab 06/08/21 2025  AST 36  ALT 23  ALKPHOS 91  BILITOT 0.7  PROT 8.4*  ALBUMIN 4.1   Recent Labs  Lab 06/08/21 2025  LIPASE 49   No results for input(s): AMMONIA in the last 168 hours. Coagulation profile No results for input(s): INR, PROTIME in the last 168 hours.  CBC: Recent Labs  Lab 06/08/21 2025  WBC 8.1  HGB 14.6  HCT 42.9  MCV 88.6  PLT 488*   Cardiac Enzymes: No results for input(s): CKTOTAL, CKMB, CKMBINDEX, TROPONINI in the last 168 hours. BNP: Invalid input(s): POCBNP CBG: Recent Labs  Lab 06/09/21 0444 06/09/21 0544 06/09/21 0639 06/09/21 0803 06/09/21 1122  GLUCAP 166* 168* 167* 145* 247*   D-Dimer Recent Labs    06/08/21 2025  DDIMER <0.27   Hgb A1c Recent Labs    06/08/21 2025  HGBA1C 10.1*   Lipid Profile No results for input(s): CHOL, HDL, LDLCALC, TRIG, CHOLHDL,  LDLDIRECT in the last 72 hours. Thyroid function studies No results for input(s): TSH, T4TOTAL, T3FREE, THYROIDAB in the last 72 hours.  Invalid input(s): FREET3 Anemia work up No results for input(s): VITAMINB12, FOLATE, FERRITIN, TIBC, IRON, RETICCTPCT in the last 72 hours. Microbiology Recent Results (from the past 240 hour(s))  Resp Panel by RT-PCR (Flu A&B, Covid) Nasopharyngeal Swab     Status: None   Collection Time: 06/08/21 10:40 PM   Specimen: Nasopharyngeal Swab; Nasopharyngeal(NP) swabs in vial transport medium  Result Value Ref Range Status   SARS Coronavirus 2 by RT PCR NEGATIVE NEGATIVE Final    Comment: (NOTE) SARS-CoV-2 target nucleic acids are NOT DETECTED.  The SARS-CoV-2 RNA is generally detectable in upper respiratory specimens during the acute phase of infection. The lowest concentration of SARS-CoV-2 viral copies this assay can detect is 138 copies/mL. A negative result does not preclude SARS-Cov-2 infection and should not be used as the sole basis for treatment or other patient management decisions. A negative result may occur with  improper specimen collection/handling, submission of specimen other than nasopharyngeal swab, presence of viral mutation(s) within the areas targeted by this assay, and inadequate number of viral copies(<138 copies/mL). A negative result must be combined with clinical observations, patient history, and epidemiological information. The expected result is Negative.  Fact Sheet for Patients:  EntrepreneurPulse.com.au  Fact Sheet for Healthcare Providers:  IncredibleEmployment.be  This test is no t yet approved or cleared by the [place] FDA and  has been authorized for detection and/or diagnosis of SARS-CoV-2 by FDA under an Emergency Use Authorization (EUA). This EUA will remain  in effect (meaning this test can be used) for the duration of the COVID-19 declaration under Section  564(b)(1) of the Act, 21 U.S.C.section 360bbb-3(b)(1), unless the authorization is terminated  or revoked sooner.       Influenza A by PCR NEGATIVE NEGATIVE Final   Influenza B by PCR NEGATIVE NEGATIVE Final    Comment: (NOTE) The Xpert Xpress SARS-CoV-2/FLU/RSV plus assay is intended as an aid in the diagnosis of influenza from Nasopharyngeal swab specimens and should not be used as a sole basis for treatment. Nasal washings and aspirates are unacceptable for Xpert Xpress SARS-CoV-2/FLU/RSV testing.  Fact Sheet for Patients: EntrepreneurPulse.com.au  Fact Sheet for Healthcare Providers: IncredibleEmployment.be  This test is not yet approved or cleared by the [place] FDA and has been authorized for detection and/or diagnosis of SARS-CoV-2 by FDA under an Emergency Use Authorization (EUA). This EUA will remain in effect (meaning this test can be used) for the duration of the COVID-19 declaration under Section 564(b)(1) of the Act, 21 U.S.C. section 360bbb-3(b)(1), unless the authorization is terminated or revoked.  Performed at [facility], [place] [address], [place], [place] [address]   MRSA Next Gen by PCR, Nasal     Status: None   Collection Time: 06/09/21  3:21 AM   Specimen: Nasal Mucosa; Nasal Swab  Result Value Ref Range Status   MRSA by PCR Next Gen NOT DETECTED NOT DETECTED Final    Comment: (NOTE) The GeneXpert MRSA Assay (FDA approved for NASAL specimens only), is one component of a comprehensive MRSA colonization surveillance program. It is not intended to diagnose MRSA infection nor to guide or monitor treatment for MRSA infections. Test performance is not FDA approved in patients less than 54 years old. Performed at [facility], [place] [address] [place], [place] [address]      Discharge Instructions:   Discharge Instructions     Diet Carb Modified   Complete by: As directed     Increase activity slowly   Complete by: As directed       Allergies as of 06/09/2021       Reactions   Ciprofloxacin Hives   Lyrica Cr [pregabalin Er] Other (See Comments)   Suicidal ideation   Doxycycline Rash   Yeast infection   Flecainide Hives, Nausea And Vomiting, Rash   Latex Rash, Other (See Comments)   Causes blisters, also   Penicillin G Rash   Has patient had a PCN reaction causing immediate rash, facial/tongue/throat swelling, SOB or lightheadedness with hypotension: yes Has patient had a PCN reaction causing severe rash involving mucus membranes or skin necrosis: yes Has patient had a PCN reaction that required hospitalization: no Has patient had a PCN reaction occurring within the last 10 years: no If all of the above answers are "NO", then may proceed with Cephalosporin use.        Medication List     STOP taking these medications    fluticasone 50 MCG/ACT nasal spray Commonly known as: FLONASE   predniSONE 10 MG tablet Commonly known as: DELTASONE   triamcinolone cream 0.1 % Commonly known as: KENALOG       TAKE these medications    atorvastatin 20 MG tablet Commonly known as: LIPITOR Take 1 tablet (20 mg total) by mouth daily.   gabapentin 300 MG capsule Commonly known as: NEURONTIN TAKE 2 CAPSULES (600 MG TOTAL) BY MOUTH 3 (THREE) TIMES DAILY.   glucose blood test strip USE TO CHECK BLOOD SUGAR 3 TIMES DAILY   hydrOXYzine 25 MG capsule Commonly known as: VISTARIL Take 1 capsule (25 mg total) by mouth every 8 (eight) hours as needed.   insulin aspart 100 UNIT/ML injection Commonly known as: novoLOG Inject 4 Units into the skin 3 (three) times daily with meals.   insulin aspart 100 UNIT/ML FlexPen Commonly known as: NOVOLOG 4 units TID with meals and  SSI TID CBG 70 - 120: 0 units CBG 121 - 150: 2 units CBG 151 - 200: 3 units CBG 201 - 250: 5 units CBG 251 - 300: 8 units CBG 301 - 350: 11 units CBG 351 - 400: 15 units   insulin  glargine-yfgn 100 UNIT/ML Pen Commonly known as: SEMGLEE Inject 20 Units into the skin daily.   ipratropium-albuterol 0.5-2.5 (3) MG/3ML Soln Commonly known as: DUONEB Take 3 mLs by nebulization every 6 (six) hours as needed (wheezing).   metFORMIN 500 MG tablet Commonly known as: GLUCOPHAGE Take 1 tablet (500 mg total) by mouth 2 (two) times daily with a meal.   nystatin cream Commonly known  as: MYCOSTATIN Apply topically 2 (two) times daily as needed (apply to rash).   OneTouch Delica Lancets 94Q Misc Use to check blood sugar TID.   OneTouch Delica Plus [id number] Misc USE TO CHECK BLOOD SUGAR 3 TIMES DAILY   OneTouch Verio w/Device Kit Use to check blood sugar TID.   OneTouch Verio Reflect w/Device Kit USE TO CHECK BLOOD SUGAR 3 TIMES DAILY   sertraline 100 MG tablet Commonly known as: ZOLOFT TAKE 1 TABLET (100 MG TOTAL) BY MOUTH DAILY.   topiramate 50 MG tablet Commonly known as: TOPAMAX Take 1 tablet (50 mg total) by mouth daily as needed (migraine).   TRUEplus Pen Needles 31G X 5 MM Misc Generic drug: Insulin Pen Needle USE AS INSTRUCTED. CHECK BLOOD GLUCOSE LEVEL BY FINGERSTICK TWICE PER DAY.        Follow-up Information     Gildardo Pounds, NP Follow up.   Specialty: Nurse Practitioner Why: bring log of blood sugar to PCP Contact information: [place] [address] [phone]         Sueanne Margarita, MD .   Specialty: Cardiology Contact information: [address] [place] [address] [phone]                  Time coordinating discharge: 35 min  Signed:  Geradine Girt DO  [facility] 06/09/2021, 1:42 PM

## 2021-06-09 NOTE — Plan of Care (Signed)
Pt resting in bed, NAD. Pt rates pain 5/10, treated w/ tramadol, now pain 4/10. Given zofran for nausea. Last anion gap 8, awaiting 3 CBG results w/in range for transition consideration off of insulin gtt

## 2021-06-09 NOTE — Telephone Encounter (Signed)
Calls taken after [place] hours.  Patient was advised again to go to ED for further treatment.

## 2021-06-09 NOTE — Progress Notes (Signed)
Inpatient Diabetes Program Recommendations  AACE/ADA: New Consensus Statement on Inpatient Glycemic Control (2015)  Target Ranges:  Prepandial:   less than 140 mg/dL      Peak postprandial:   less than 180 mg/dL (1-2 hours)      Critically ill patients:  140 - 180 mg/dL   Lab Results  Component Value Date   GLUCAP 145 (H) 06/09/2021   HGBA1C 10.1 (H) 06/08/2021    Review of Glycemic Control  Diabetes history: DM2 Outpatient Diabetes medications: Semglee 20 units QD, metformin 500 mg BID Current orders for Inpatient glycemic control: Semglee 20 QHS, Novolog 0-15 units TID with meals and 0-5 HS, metformin 500 mg BID  HgbA1C - 10.1% PCP - [facility]  Inpatient Diabetes Program Recommendations:    Add Novolog 4 units TID for meal coverage insulin  Spoke with pt about her HgbA1C of 10.1% with average blood sugar of 243 mg/dL. Pt states she checks her blood sugars every day and her FBS is usually above 200. Also thinks she needs meal coverage insulin and is willing to give herself insulin before each meal. Pt states she sees a different doctor almost every visit to [facility]. Would like to be more aggressive with controlling blood sugars. Gets very little exercise, but states she will try to do better. Weight is stable, no weight loss recently. Will order OP Diabetes Education consult. Answered questions from pt and pt's visitor.   Will continue to follow.  Thank you. Lorenda Peck, RD, LDN, CDE Inpatient Diabetes Coordinator [phone]

## 2021-06-09 NOTE — H&P (Signed)
History and Physical    Ivana Nicastro DPO:[id number] DOB: [birth year] DOA: 06/08/2021  PCP: Gildardo Pounds, NP   Patient coming from: Home  Chief Complaint: elevated blood sugar  HPI: Michelle Gibson is a 45 y.o. female with medical history significant for DMT2, HLD, migraine headaches, WPW, intermittent asthma who presents with complaint of high blood sugar for the past few weeks. She states her sugar has stayed over 300 at home despite taking her lantus and metformin. She reports she is following low carb diet.  Any recent fevers or illness.  She denies any chest pain.  She has intermittent shortness of breath with her asthma.  She states she was seen by her PCP earlier today and was given a dose of Solu-Medrol in the office.  She has had some mild abdominal cramping and pain but no vomiting or diarrhea.  She has not been on any recent antibiotics she states.  She reports she developed gestational diabetes and a few years later type 2 diabetes.  She has been on insulin since then.  No sick contacts at home. Is tobacco, alcohol, illicit drug use  ED Course: She has been hemodynamically stable with mild tachycardia in the emergency room.  Blood sugars have remained elevated in the 450-550 range.  She was started on insulin infusion in the emergency room.  CBC is unremarkable.  Troponin levels twice in the emergency room were negative at 2.  Sodium 134 but when corrected for hyperglycemia is 142 potassium 4.7 chloride 95 bicarb 25 creatinine 0.99 BUN 20 glucose 569 magnesium 2.2 albumin 4.1 lipase 49 AST 36 ALT 23 alkaline phosphatase 91.  VBG pH 7.355 PCO2 47.7 PO2 45 bicarb 26.  Urinalysis unremarkable except for glucose present.  Beta-hCG negative.  COVID-19 negative influenza A and B are negative.  Hospitalist service have been asked to admit for further management  Review of Systems:  General: Denies fever, chills, weight loss, night sweats. Denies dizziness. Denies change in appetite HENT:  Denies head trauma, headache, denies change in hearing, tinnitus. Denies nasal congestion.  Denies sore throat.  Denies difficulty swallowing Eyes: Denies blurry vision, pain in eye, drainage.  Denies discoloration of eyes. Neck: Denies pain.  Denies swelling.  Denies pain with movement. Cardiovascular: Denies chest pain, palpitations.  Denies edema.  Denies orthopnea Respiratory: Reports intermittent shortness of breath with exertion.  Denies wheezing.  Denies sputum production Gastrointestinal: Reports abdominal pain and nausea. Denies vomiting, diarrhea. Denies melena.  Denies hematemesis. Musculoskeletal: Denies limitation of movement.  Denies deformity or swelling. Denies arthralgias or myalgias. Genitourinary: Denies pelvic pain.  Denies urinary hesitancy.  Denies dysuria.  Skin: Denies rash.  Denies petechiae, purpura, ecchymosis. Neurological:  Denies syncope. Denies seizure activity. Denies paresthesia.Denies slurred speech, drooping face. Denies visual change. Psychiatric: Denies depression, anxiety.  Denies hallucinations.  Past Medical History:  Diagnosis Date   Acute tear lateral meniscus, right, initial encounter 08/29/2019   Asthma    Closed fracture of upper end of fibula 09/21/2018   Diabetes mellitus without complication ([place])    TYPE II   Hyperglycemia 09/15/2016   Migraines    WPW (Wolff-Parkinson-White syndrome)    s/p ablation at age 88    Past Surgical History:  Procedure Laterality Date   ABDOMINAL HYSTERECTOMY     CARDIAC SURGERY     CESAREAN SECTION     KNEE ARTHROSCOPY WITH LATERAL MENISECTOMY Right 08/29/2019   Procedure: PARTIAL LATERAL MENISECTOMY;  Surgeon: Leandrew Koyanagi, MD;  Location: [facility]  [place];  Service: Orthopedics;  Laterality: Right;   KNEE ARTHROSCOPY WITH MEDIAL MENISECTOMY Right 08/29/2019   Procedure: RIGHT KNEE ARTHROSCOPY WITH PARTIAL MEDIAL MENISECTOMY;  Surgeon: Leandrew Koyanagi, MD;  Location: [place];   Service: Orthopedics;  Laterality: Right;    Social History  reports that she has quit smoking. Her smoking use included cigarettes. She smoked an average of .1 packs per day. She has never used smokeless tobacco. She reports that she does not drink alcohol and does not use drugs.  Allergies  Allergen Reactions   Ciprofloxacin Hives   Lyrica Cr [Pregabalin Er] Other (See Comments)    Suicidal ideation   Flecainide Hives, Nausea And Vomiting and Rash   Latex Rash and Other (See Comments)    Causes blisters, also   Penicillin G Rash    Has patient had a PCN reaction causing immediate rash, facial/tongue/throat swelling, SOB or lightheadedness with hypotension: yes Has patient had a PCN reaction causing severe rash involving mucus membranes or skin necrosis: yes Has patient had a PCN reaction that required hospitalization: no Has patient had a PCN reaction occurring within the last 10 years: no If all of the above answers are "NO", then may proceed with Cephalosporin use.     Family History  Problem Relation Age of Onset   Hypertension Mother    CAD Mother 10   Diabetes Mother    Hypertension Father    CAD Father 13       CABG     Prior to Admission medications   Medication Sig Start Date End Date Taking? Authorizing Provider  atorvastatin (LIPITOR) 20 MG tablet Take 1 tablet (20 mg total) by mouth daily. 05/25/21   Gildardo Pounds, NP  Blood Glucose Monitoring Suppl (ONETOUCH VERIO REFLECT) w/Device KIT USE TO CHECK BLOOD SUGAR 3 TIMES DAILY 12/10/20 12/10/21  Charlott Rakes, MD  Blood Glucose Monitoring Suppl (ONETOUCH VERIO) w/Device KIT Use to check blood sugar TID. 12/10/20   Charlott Rakes, MD  fluticasone (FLONASE) 50 MCG/ACT nasal spray PLACE 2 SPRAYS INTO BOTH NOSTRILS DAILY. Patient not taking: No sig reported 12/29/20 12/29/21  Scot Jun, FNP  gabapentin (NEURONTIN) 300 MG capsule TAKE 2 CAPSULES (600 MG TOTAL) BY MOUTH 3 (THREE) TIMES DAILY. 05/25/21 05/25/22   Gildardo Pounds, NP  glucose blood test strip USE TO CHECK BLOOD SUGAR 3 TIMES DAILY 05/25/21 05/25/22  Gildardo Pounds, NP  hydrOXYzine (VISTARIL) 25 MG capsule Take 1 capsule (25 mg total) by mouth every 8 (eight) hours as needed. 06/08/21   Gildardo Pounds, NP  insulin glargine-yfgn (SEMGLEE) 100 UNIT/ML Pen Inject 20 Units into the skin daily. 05/25/21   Gildardo Pounds, NP  Insulin Pen Needle 31G X 5 MM MISC USE AS INSTRUCTED. CHECK BLOOD GLUCOSE LEVEL BY FINGERSTICK TWICE PER DAY. 12/09/20 12/09/21  Gildardo Pounds, NP  ipratropium-albuterol (DUONEB) 0.5-2.5 (3) MG/3ML SOLN Take 3 mLs by nebulization every 6 (six) hours as needed (wheezing).    [provider]  Lancets (ONETOUCH DELICA PLUS [id number]) MISC USE TO CHECK BLOOD SUGAR 3 TIMES DAILY 12/10/20 12/10/21  Charlott Rakes, MD  metFORMIN (GLUCOPHAGE) 500 MG tablet Take 1 tablet (500 mg total) by mouth 2 (two) times daily with a meal. Patient not taking: Reported on 05/18/2021 12/09/20 03/09/21  Gildardo Pounds, NP  OneTouch Delica Lancets 49P MISC Use to check blood sugar TID. 12/10/20   Charlott Rakes, MD  predniSONE (DELTASONE) 10 MG tablet Take  1 tablet (10 mg total) by mouth daily with breakfast for 5 days. 06/04/21 06/13/21  Gildardo Pounds, NP  sertraline (ZOLOFT) 100 MG tablet TAKE 1 TABLET (100 MG TOTAL) BY MOUTH DAILY. 05/25/21 05/25/22  Gildardo Pounds, NP  topiramate (TOPAMAX) 50 MG tablet TAKE 1 TABLET (50 MG TOTAL) BY MOUTH DAILY. Patient taking differently: Take 50 mg by mouth daily as needed (migraine). 12/09/20 12/09/21  Gildardo Pounds, NP  triamcinolone cream (KENALOG) 0.1 % Apply 1 application topically 2 (two) times daily. 06/08/21   Enrique Sack, FNP  insulin glargine (LANTUS) 100 unit/mL SOPN Inject 20 Units into the skin daily.  05/25/21  [provider]  metoCLOPramide (REGLAN) 10 MG tablet Take 1 tablet (10 mg total) by mouth every 8 (eight) hours as needed for nausea or vomiting (headache). Patient not  taking: Reported on 07/14/2020 07/07/20 07/17/20  Petrucelli, Glynda Jaeger, PA-C  traZODone (DESYREL) 50 MG tablet Take 0.5-1 tablets (25-50 mg total) by mouth at bedtime as needed for sleep. 06/04/19 07/13/19  Antony Blackbird, MD    Physical Exam: Vitals:   06/08/21 2130 06/08/21 2200 06/08/21 2240 06/08/21 2300  BP: (!) 125/94 120/74 118/83 129/85  Pulse: (!) 105 (!) 109 (!) 103 (!) 103  Resp: '10 12 18 19  ' Temp:      TempSrc:      SpO2: 93% 93% 95% 93%  Weight:      Height:        Constitutional: NAD, calm, comfortable Vitals:   06/08/21 2130 06/08/21 2200 06/08/21 2240 06/08/21 2300  BP: (!) 125/94 120/74 118/83 129/85  Pulse: (!) 105 (!) 109 (!) 103 (!) 103  Resp: '10 12 18 19  ' Temp:      TempSrc:      SpO2: 93% 93% 95% 93%  Weight:      Height:       General: WDWN, Alert and oriented x3.  Eyes: EOMI, PERRL, conjunctivae normal.  Sclera nonicteric HENT:  [place]/AT, external ears normal.  Nares patent without epistasis.  Mucous membranes are dry. Posterior pharynx clear  Neck: Soft, normal range of motion, supple, no masses, Trachea midline Respiratory: clear to auscultation bilaterally, no wheezing, no crackles. Normal respiratory effort. No accessory muscle use.  Cardiovascular: Regular rate and rhythm, no murmurs / rubs / gallops. No extremity edema. Abdomen: Soft, no tenderness, nondistended, no rebound or guarding.  No masses palpated.Bowel sounds normoactive Musculoskeletal: FROM. no cyanosis. No joint deformity upper and lower extremities. no contractures. Normal muscle tone.  Skin: Warm, dry, intact no rashes, lesions, ulcers. No induration Neurologic: CN 2-12 grossly intact.  Normal speech.  Sensation intact to touch. Strength 5/5 in all extremities.   Psychiatric: Normal judgment and insight.  Normal mood.    Labs on Admission: I have personally reviewed following labs and imaging studies  CBC: Recent Labs  Lab 06/08/21 2025  WBC 8.1  HGB 14.6  HCT 42.9  MCV 88.6   PLT 488*    Basic Metabolic Panel: Recent Labs  Lab 06/08/21 2025  NA 134*  K 4.7  CL 95*  CO2 25  GLUCOSE 569*  BUN 20  CREATININE 0.99  CALCIUM 9.5  MG 2.2    GFR: Estimated Creatinine Clearance: 83 mL/min (by C-G formula based on SCr of 0.99 mg/dL).  Liver Function Tests: Recent Labs  Lab 06/08/21 2025  AST 36  ALT 23  ALKPHOS 91  BILITOT 0.7  PROT 8.4*  ALBUMIN 4.1    Urine analysis:  Component Value Date/Time   COLORURINE YELLOW 06/08/2021 2025   APPEARANCEUR CLEAR 06/08/2021 2025   LABSPEC 1.027 06/08/2021 2025   PHURINE 5.0 06/08/2021 2025   GLUCOSEU >=500 (A) 06/08/2021 2025   HGBUR SMALL (A) 06/08/2021 2025   BILIRUBINUR NEGATIVE 06/08/2021 2025   BILIRUBINUR negative 12/29/2020 1144   KETONESUR 5 (A) 06/08/2021 2025   PROTEINUR NEGATIVE 06/08/2021 2025   UROBILINOGEN 0.2 12/29/2020 1144   NITRITE NEGATIVE 06/08/2021 2025   LEUKOCYTESUR NEGATIVE 06/08/2021 2025    Radiological Exams on Admission: DG Chest 2 View  Result Date: 06/08/2021 CLINICAL DATA:  Chest pain EXAM: CHEST - 2 VIEW COMPARISON:  05/18/2021 FINDINGS: Lungs are clear.  No pleural effusion or pneumothorax. The heart is normal in size. Visualized osseous structures are within normal limits. Median sternotomy. IMPRESSION: Normal chest radiographs. Electronically Signed   By: Julian Hy M.D.   On: 06/08/2021 19:59    EKG: Independently reviewed.  EKG shows sinus tachycardia with nonspecific lateral ST changes but no acute ST elevation or depression.  QTc 422  Assessment/Plan Principal Problem:   Uncontrolled type 2 diabetes mellitus with hyperglycemia  Ms. Manganelli is admitted to stepdown unit. Insulin infusion started in ER and she is still hyperglycemic. Continue IVF with LR and insulin per protocol. Will wean infusion once blood sugar under 250 and provide basal insulin Check HgbA1c.  Pt states bloodsugars have been high for past few weeks despite lantus and metformin at  home. May benefit to adjust regimen and use a GLP-1 agonist or SLGT2 inhibitor. Will need to review options with her once blood sugar stabalized and HgbA1c level known.   Active Problems:   Nausea Zofran as needed. IVF hydration    Hyperlipidemia Continue lipitor.    DVT prophylaxis: Padua score low. TED hose and early ambulation for DVT prophylaxis  Code Status:   Full Code  Family Communication:  Diagnosis and plan discussed with patient.  She verbalized understanding agrees with plan.  Further care as well as clinical indicated Disposition Plan:   Patient is from:  Home  Anticipated DC to:  Home  Anticipated DC date:  Anticipate 2 midnight stay in the hospital to treat acute condition  Admission status:  Inpatient   Yevonne Aline Camiah Humm MD [facility]  How to contact the [facility] Attending or Consulting provider [place] or covering provider during after hours 7P -7A, for this patient?   Check the care team in [facility] and look for a) attending/consulting [facility] provider listed and b) the [facility] team listed Log into [email or web address] and use [place] universal password to access. If you do not have the password, please contact the hospital operator. Locate the [facility] provider you are looking for under [facility] and page to a number that you can be directly reached. If you still have difficulty reaching the provider, please page the [facility] (Director on Call) for the Hospitalists listed on amion for assistance.  06/09/2021, 12:01 AM

## 2021-06-10 ENCOUNTER — Telehealth: Payer: Self-pay

## 2021-06-10 ENCOUNTER — Other Ambulatory Visit: Payer: Self-pay

## 2021-06-10 LAB — URINE CULTURE: Culture: 30000 — AB

## 2021-06-10 NOTE — Telephone Encounter (Signed)
Transition Care Management Unsuccessful Follow-up Telephone Call  Date of discharge and from where:  06/09/2021, [facility]   Attempts:  1st Attempt  Reason for unsuccessful TCM follow-up call:  Left voice message on # [phone]. Call back requested to this CM

## 2021-06-11 ENCOUNTER — Telehealth: Payer: Self-pay | Admitting: Nurse Practitioner

## 2021-06-11 ENCOUNTER — Other Ambulatory Visit: Payer: Self-pay

## 2021-06-11 ENCOUNTER — Telehealth: Payer: Self-pay

## 2021-06-11 ENCOUNTER — Other Ambulatory Visit: Payer: Self-pay | Admitting: Nurse Practitioner

## 2021-06-11 NOTE — Telephone Encounter (Signed)
Transition Care Management Follow-up Telephone Call  Call returned to patient  Date of discharge and from where: 06/09/2021, [facility]  How have you been since you were released from the hospital? She said she is feeling better. She continues to have the rash , like hives, all over her body.  The rash was present  when she was in the hospital . She said she has been using the nystatin cream but prefers the powder.  Any questions or concerns? Yes - noted above.   Items Reviewed: Did the pt receive and understand the discharge instructions provided? Yes  Medications obtained and verified?  Med list reviewed with patient. She has all medications except the topamax which she said is not needed. She is requesting refills of lantus, metformin and test strips for her TrueMetrix glucometer.  She also explained that she has been reusing her insulin pen needles because she can't afford a new box - $40. She would like to know if there are any programs to assist with the cost of these needles .    Other? No  Any new allergies since your discharge? No  Dietary orders reviewed? No Do you have support at home? Yes   Home Care and Equipment/Supplies: Were home health services ordered? no If so, what is the name of the agency? N/a  Has the agency set up a time to come to the patient's home? not applicable Were any new equipment or medical supplies ordered?  No What is the name of the medical supply agency? N/a Were you able to get the supplies/equipment? not applicable Do you have any questions related to the use of the equipment or supplies? No  She has a glucometer and has been checking her blood sugars and taking her insulins as ordered and keeping a log of results. FBS this morning : 165, after breakfast: 300.  She also has a nebulizer.   Functional Questionnaire: (I = Independent and D = Dependent) ADLs: independent   Follow up appointments reviewed:  PCP Hospital f/u appt confirmed?  Yes  Scheduled to see Durene Fruits, NP on 06/19/2021 @ 1350.  She said that [facility] is close to her and she would prefer to establish care with Ms Minette Brine, [facility] f/u appt confirmed?  None scheduled at this time   Are transportation arrangements needed? No  If their condition worsens, is the pt aware to call PCP or go to the Emergency Dept.? Yes Was the patient provided with contact information for the PCP's office or ED? Yes Was to pt encouraged to call back with questions or concerns? Yes

## 2021-06-11 NOTE — Telephone Encounter (Signed)
Copied from [place] [id number]. Topic: General - Other >> Jun 10, 2021 12:45 PM Tessa Lerner A wrote: Reason for CRM: Patient has returned missed call from [place]  Please contact again when possible

## 2021-06-12 ENCOUNTER — Other Ambulatory Visit: Payer: Self-pay

## 2021-06-12 ENCOUNTER — Other Ambulatory Visit: Payer: Self-pay | Admitting: Nurse Practitioner

## 2021-06-12 DIAGNOSIS — U071 COVID-19: Secondary | ICD-10-CM

## 2021-06-12 MED ORDER — METFORMIN HCL 500 MG PO TABS
500.0000 mg | ORAL_TABLET | Freq: Two times a day (BID) | ORAL | 2 refills | Status: DC
  Filled 2021-06-12: qty 60, 30d supply, fill #0

## 2021-06-12 NOTE — Telephone Encounter (Signed)
Metformin refill sent. Lantus is available in our pharmacy.

## 2021-06-12 NOTE — Telephone Encounter (Signed)
Requested medication (s) are due for refill today: last ordered 05/25/21  Requested medication (s) are on the active medication list: yes  Last refill:  05/25/21 -05/25/22 #180 3 refills   Future visit scheduled: future visit [facility] in 1 week   Notes to clinic:  medication not assigned to a protocol. [id number]     Requested Prescriptions  Pending Prescriptions Disp Refills   fluconazole (DIFLUCAN) 150 MG tablet 1 tablet 0    Sig: Take 1 tablet (150 mg total) by mouth once for 1 dose.     Off-Protocol Failed - 06/12/2021  4:13 PM      Failed - Medication not assigned to a protocol, review manually.      Passed - Valid encounter within last 12 months    Recent Outpatient Visits           2 weeks ago Type 2 diabetes mellitus with other specified complication, with long-term current use of insulin [facility])   [place], [place] W, NP   6 months ago Type 2 diabetes mellitus without complication, without long-term current use of insulin [facility])   [place], [place] W, NP   7 months ago Type 2 diabetes mellitus with hyperglycemia, without long-term current use of insulin [facility])   [place], [place] Buff, NP   10 months ago Bumps on skin   [place], [place], NP   11 months ago Kidney stone on left side   [place] Antony Blackbird, MD       Future Appointments             In 1 week Camillia Herter, NP Primary Care at [facility]

## 2021-06-12 NOTE — Telephone Encounter (Signed)
   Notes to clinic This rx was discontinued by discharging MD, has current rx dated 12/09/20 ordered by Gildardo Pounds, NP, please assess.

## 2021-06-14 NOTE — Progress Notes (Addendum)
TRANSITION OF CARE VISIT   Primary Care Provider (PCP):   Durene Fruits, NP                                                     [facility] Primary [facility] at [facility] [address] [place],  [place]  [address] Phone: [phone] Fax: [phone]    Date of Admission: 06/08/2021  Date of Discharge: 06/09/2021  Transitions of Care Call: 06/11/2021 with [name], RN  Discharged from: [facility]  Discharge Diagnosis:  Principal Problem:   Uncontrolled type 2 diabetes mellitus with hyperglycemia ([place]) Active Problems:   Nausea   Hyperlipidemia  Summary of Admission:  Michelle Gibson is a 45 y.o. female with medical history significant for DMT2, HLD, migraine headaches, WPW, intermittent asthma who presents with complaint of high blood sugar for the past few weeks. She states her sugar has stayed over 300 at home despite taking her lantus and metformin. She reports she is following low carb diet.  Any recent fevers or illness.  She denies any chest pain.  She has intermittent shortness of breath with her asthma.    Uncontrolled type 2 diabetes mellitus with hyperglycemia  HgbA1c: 10  -blood sugars have been high for past few weeks despite lantus and metformin at home. -discussed with DM coordinator, will add humalog with meals (scheduled and SSI) -patient to bring log to PCP     Nausea -resolved     Hyperlipidemia Continue lipitor.   Follow-Ups: Follow up with Gildardo Pounds, NP (Nurse Practitioner); bring log of blood sugar to PCP Follow up with Radford Pax, Eber Hong, MD (Cardiology)  TODAY's VISIT DIABETES TYPE 2 FOLLOW-UP: Doing well on current regimen. Reports since beginning Humalog blood sugars have been less than 200 on most days. On average blood sugars are 110's. No medication refills needed at the moment.  Last A1C:  10.1% on 06/09/2021 Med Adherence:  '[x]'$  Yes    '[]'$  No Medication side  effects:  '[]'$  Yes    '[x]'$  No Home Monitoring?  '[x]'$  Yes    '[]'$  No Diet Adherence: '[x]'$  Yes    '[]'$  No Last eye exam: Reports due for eye exam. 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 Right eye vision blurry far away and up close. She is the only who can drive in her family. Has two teenage children. Would like referral to eye doctor.  Comments: Requesting Dexcom glucose monitor.  HYPERLIPIDEMIA FOLLOW-UP: Doing well on current regimen. No side effects. No issues/concerns.   Patient/Caregiver self-reported problems/concerns: Rash developed as a side effect of taking Doxycyline prescribed 03/12/2021 related to Covid and cough. Rash worsening since then. Has tried course of Prednisone, Triamcinolone, and Nystatin without relief. Requesting oral medication.  MEDICATIONS  Medication Reconciliation conducted with patient/caregiver? (Yes/ No): Yes   New medications prescribed/discontinued upon discharge? (Yes/No): Yes  Barriers identified related to medications: No  LABS  Lab Reviewed (Yes/No/NA): Yes  PHYSICAL EXAM:  Physical Exam HENT:     Head: Normocephalic and atraumatic.  Eyes:     Extraocular Movements: Extraocular movements intact.     Conjunctiva/sclera: Conjunctivae normal.     Pupils: Pupils are equal, round, and reactive to light.  Cardiovascular:     Rate and Rhythm: Normal  rate and regular rhythm.     Pulses: Normal pulses.     Heart sounds: Normal heart sounds.  Pulmonary:     Effort: Pulmonary effort is normal.     Breath sounds: Normal breath sounds.  Musculoskeletal:     Cervical back: Normal range of motion and neck supple.  Skin:    General: Skin is warm and dry.     Findings: Rash present.     Comments: Generalized papular erythematous rash without evidence of drainage.  Neurological:     General: No focal deficit present.     Mental Status: She is alert and oriented to person, place, and time.  Psychiatric:        Mood and  Affect: Mood normal.        Behavior: Behavior normal.    ASSESSMENT: 1. Encounter to establish care: - Patient presents today to establish care.  - Return for annual physical examination, labs, and health maintenance. Arrive fasting meaning having no food for at least 8 hours prior to appointment. You may have only water or black coffee. Please take scheduled medications as normal.  2. Hospital discharge follow-up: - Reviewed hospital course, current medications, ensured proper follow-up in place, and addressed concerns.   3. Uncontrolled type 2 diabetes mellitus with hyperglycemia ([place]): - Hemoglobin A1c last obtained 06/09/2021 and not at goal at 10.1% at that time, goal < 7%. Next hemoglobin A1c due November 2022.  - Continue Metformin, Insulin Lispro, Insulin Glargine, and Gabapentin as prescribed. No medication refills needed at the moment. - Discussed the importance of healthy eating habits, low-carbohydrate diet, low-sugar diet, regular aerobic exercise (at least 150 minutes a week as tolerated) and medication compliance to achieve or maintain control of diabetes. - To achieve an A1C goal of less than or equal to 7.0 percent, a fasting blood sugar of 80 to 130 mg/dL and a postprandial glucose (90 to 120 minutes after a meal) less than 180 mg/dL. In the event of sugars less than 60 mg/dl or greater than 400 mg/dl please notify the clinic ASAP. It is recommended that you undergo annual eye exams and annual foot exams. - Follow-up with primary provider in 8 weeks or sooner if needed.  - Continuous Blood Gluc Receiver ([place]) DEVI; 1 each by Does not apply route 4 (four) times daily -  before meals and at bedtime.  Dispense: 1 each; Refill: 0 - Continuous Blood Gluc Sensor (DEXCOM G6 SENSOR) MISC; 1 each by Does not apply route 4 (four) times daily -  before meals and at bedtime.  Dispense: 1 each; Refill: 1 - Continuous Blood Gluc Transmit (DEXCOM G6 TRANSMITTER) MISC; 1 each by  Does not apply route 4 (four) times daily -  before meals and at bedtime.  Dispense: 1 each; Refill: 1  4. Hyperlipidemia, unspecified hyperlipidemia type: -Practice low-fat heart healthy diet and at least 150 minutes of moderate intensity exercise weekly as tolerated.  - Continue Atorvastatin as prescribed. No medication refills needed at the moment. - Follow-up with primary provider as scheduled.  5. Allergic dermatitis: - Seems related to previous prescription of Doxycycline. - Prednisone as prescribed.  - Follow-up with primary provider as scheduled.  - predniSONE (DELTASONE) 10 MG tablet; Take 1 tablet (10 mg total) by mouth daily with breakfast for 7 days.  Dispense: 7 tablet; Refill: 0  6. Financial difficulty: - Offered patient  financial discount/orange card and blue card application. Counseled patient will need to have an appointment  with the financial counselor for processing of documentation. Patient agreeable.    PATIENT EDUCATION PROVIDED: See AVS   FOLLOW-UP (Include any further testing or referrals): Follow-up with primary provider in 8 weeks or sooner if needed.

## 2021-06-16 ENCOUNTER — Other Ambulatory Visit: Payer: Self-pay

## 2021-06-16 MED ORDER — FLUCONAZOLE 150 MG PO TABS
150.0000 mg | ORAL_TABLET | Freq: Once | ORAL | 0 refills | Status: AC
  Filled 2021-06-16: qty 1, 1d supply, fill #0

## 2021-06-17 ENCOUNTER — Ambulatory Visit: Payer: Self-pay | Admitting: *Deleted

## 2021-06-17 NOTE — Telephone Encounter (Signed)
Pt reports rash under bdominal fold. States from one side to the other. Reports diffuse redness, some pustules, "Skin breaking down now."Onset 2 weeks ago. States has been applying nystatin cream and nystatin powder, worsening. Reports foul odor. Reports started when in hospital 06/08/21 DKA.denies fever. States "Burning like fire, looks infected, smells like death."  Advised UC. States will follow disposition.       Reason for Disposition . [1] Looks infected (spreading redness, pus) AND [2] diabetes mellitus or weak immune system (e.g., HIV positive, cancer chemo, splenectomy, organ transplant, chronic steroids)  Answer Assessment - Initial Assessment Questions 1. APPEARANCE of RASH: "Describe the rash."      Red raised 2. LOCATION: "Where is the rash located?"      Across entire lower stomach at skin fold 3. NUMBER: "How many spots are there?"      Red, skin breaking down 4. SIZE: "How big are the spots?" (Inches, centimeters or compare to size of a coin)      Diffuse red rash 5. ONSET: "When did the rash start?"      2 weeks ago 6. ITCHING: "Does the rash itch?" If Yes, ask: "How bad is the itch?"  (Scale 0-10; or none, mild, moderate, severe)     no 7. PAIN: "Does the rash hurt?" If Yes, ask: "How bad is the pain?"  (Scale 0-10; or none, mild, moderate, severe)    - NONE (0): no pain    - MILD (1-3): doesn't interfere with normal activities     - MODERATE (4-7): interferes with normal activities or awakens from sleep     - SEVERE (8-10): excruciating pain, unable to do any normal activities     Burning bad 8. OTHER SYMPTOMS: "Do you have any other symptoms?" (e.g., fever)     No."Heat coming off belly."  Protocols used: Rash or Redness - Localized-A-AH

## 2021-06-18 ENCOUNTER — Other Ambulatory Visit: Payer: Self-pay

## 2021-06-18 NOTE — Telephone Encounter (Signed)
Patient has an appointment on 06/19/2021 at 1350.

## 2021-06-18 NOTE — Telephone Encounter (Signed)
Patient encouraged to report to the Urgent Care for evaluation and management. At discharge please follow-up with me.

## 2021-06-18 NOTE — Telephone Encounter (Signed)
Call placed to patient, she had not heard from [facility] about any refills. Informed her that the refill for metformin has been sent and she can get the lantus at [facility].   There is also a current order for glucometer test strips.  Regarding the pen needles, explained to her that being uninsured she can get the pen needles free from [facility].  She can come to the pharmacy tomorrow and speak with Willey Blade, [facility] who will have a box of needles for her.  She said she would stop by the pharmacy after her appointment with Ms Minette Brine, NP.

## 2021-06-19 ENCOUNTER — Encounter: Payer: Self-pay | Admitting: Family

## 2021-06-19 ENCOUNTER — Ambulatory Visit (INDEPENDENT_AMBULATORY_CARE_PROVIDER_SITE_OTHER): Payer: Self-pay | Admitting: Family

## 2021-06-19 ENCOUNTER — Other Ambulatory Visit: Payer: Self-pay

## 2021-06-19 VITALS — BP 108/70 | HR 88 | Temp 98.1°F | Resp 18 | Ht 65.98 in | Wt 182.2 lb

## 2021-06-19 DIAGNOSIS — Z7689 Persons encountering health services in other specified circumstances: Secondary | ICD-10-CM

## 2021-06-19 DIAGNOSIS — E1165 Type 2 diabetes mellitus with hyperglycemia: Secondary | ICD-10-CM

## 2021-06-19 DIAGNOSIS — E785 Hyperlipidemia, unspecified: Secondary | ICD-10-CM

## 2021-06-19 DIAGNOSIS — Z599 Problem related to housing and economic circumstances, unspecified: Secondary | ICD-10-CM

## 2021-06-19 DIAGNOSIS — Z09 Encounter for follow-up examination after completed treatment for conditions other than malignant neoplasm: Secondary | ICD-10-CM

## 2021-06-19 DIAGNOSIS — L239 Allergic contact dermatitis, unspecified cause: Secondary | ICD-10-CM

## 2021-06-19 DIAGNOSIS — R110 Nausea: Secondary | ICD-10-CM

## 2021-06-19 MED ORDER — DEXCOM G6 RECEIVER DEVI
1.0000 | Freq: Three times a day (TID) | 0 refills | Status: DC
  Filled 2021-06-19: qty 1, fill #0

## 2021-06-19 MED ORDER — DEXCOM G6 TRANSMITTER MISC
1.0000 | Freq: Three times a day (TID) | 1 refills | Status: AC
Start: 2021-06-19 — End: ?
  Filled 2021-06-19: qty 1, fill #0

## 2021-06-19 MED ORDER — DEXCOM G6 SENSOR MISC
1.0000 | Freq: Three times a day (TID) | 1 refills | Status: DC
  Filled 2021-06-19: qty 1, fill #0

## 2021-06-19 MED ORDER — PREDNISONE 10 MG PO TABS
10.0000 mg | ORAL_TABLET | Freq: Every day | ORAL | 0 refills | Status: AC
  Filled 2021-06-19: qty 7, 7d supply, fill #0

## 2021-06-19 NOTE — Progress Notes (Signed)
Pt presents for hospital follow-up, reports body rash after taking Doxycycline

## 2021-06-19 NOTE — Patient Instructions (Signed)
Type 2 Diabetes Mellitus, Diagnosis, Adult °Type 2 diabetes (type 2 diabetes mellitus) is a long-term (chronic) disease. It may happen when there is one or both of these problems: °The pancreas does not make enough insulin. °The body does not react in a normal way to insulin that it makes. °Insulin lets sugars go into cells in your body. If you have type 2 diabetes, sugars cannot get into your cells. Sugars build up in the blood. This causes high blood sugar. °What are the causes? °The exact cause of this condition is not known. °What increases the risk? °Having type 2 diabetes in your family. °Being overweight or very overweight. °Not being active. °Your body not reacting in a normal way to the insulin it makes. °Having higher than normal blood sugar over time. °Having a type of diabetes when you were pregnant. °Having a condition that causes small fluid-filled sacs on your ovaries. °What are the signs or symptoms? °At first, you may have no symptoms. You will get symptoms slowly. They may include: °More thirst than normal. °More hunger than normal. °Needing to pee more than normal. °Losing weight without trying. °Feeling tired. °Feeling weak. °Seeing things blurry. °Dark patches on your skin. °How is this treated? °This condition may be treated by a diabetes expert. You may need to: °Follow an eating plan made by a food expert (dietitian). °Get regular exercise. °Find ways to deal with stress. °Check blood sugar as often as told. °Take medicines. °Your doctor will set treatment goals for you. Your blood sugar should be at these levels: °Before meals: 80-130 mg/dL (4.4-7.2 mmol/L). °After meals: below 180 mg/dL (10 mmol/L). °Over the last 2-3 months: less than 7%. °Follow these instructions at home: °Medicines °Take your diabetes medicines or insulin every day. °Take medicines as told to help you prevent other problems caused by this condition. You may need: °Aspirin. °Medicine to lower cholesterol. °Medicine to  control blood pressure. °Questions to ask your doctor °Should I meet with a diabetes educator? °What medicines do I need, and when should I take them? °What will I need to treat my condition at home? °When should I check my blood sugar? °Where can I find a support group? °Who can I call if I have questions? °When is my next doctor visit? °General instructions °Take over-the-counter and prescription medicines only as told by your doctor. °Keep all follow-up visits. °Where to find more information °For help and guidance and more information about diabetes, please go to: °American Diabetes Association (ADA): www.diabetes.org °American Association of Diabetes Care and Education Specialists (ADCES): www.diabeteseducator.org °International Diabetes Federation (IDF): www.idf.org °Contact a doctor if: °Your blood sugar is at or above 240 mg/dL (13.3 mmol/L) for 2 days in a row. °You have been sick for 2 days or more, and you are not getting better. °You have had a fever for 2 days or more, and you are not getting better. °You have any of these problems for more than 6 hours: °You cannot eat or drink. °You feel like you may vomit. °You vomit. °You have watery poop (diarrhea). °Get help right away if: °Your blood sugar is lower than 54 mg/dL (3 mmol/L). °You feel mixed up (confused). °You have trouble thinking clearly. °You have trouble breathing. °You have medium or large ketone levels in your pee. °These symptoms may be an emergency. Get help right away. Call your local emergency services (911 in the [place] °Do not wait to see if the symptoms will go away. °Do not drive yourself   to the hospital. °Summary °Type 2 diabetes is a long-term disease. Your pancreas may not make enough insulin, or your body may not react in a normal way to insulin that it makes. °This condition is treated with an eating plan, lifestyle changes, and medicines. °Your doctor will set treatment goals for you. These will help you keep your blood sugar  in a healthy range. °Keep all follow-up visits. °This information is not intended to replace advice given to you by your health care provider. Make sure you discuss any questions you have with your health care provider. °Document Revised: 12/22/2020 Document Reviewed: 12/22/2020 °Elsevier Patient Education © 2022 [employer] Inc. ° °

## 2021-06-22 ENCOUNTER — Encounter: Payer: Self-pay | Admitting: Family

## 2021-06-22 ENCOUNTER — Other Ambulatory Visit: Payer: Self-pay

## 2021-06-24 ENCOUNTER — Other Ambulatory Visit: Payer: Self-pay | Admitting: Family

## 2021-06-24 DIAGNOSIS — F32A Depression, unspecified: Secondary | ICD-10-CM

## 2021-06-24 DIAGNOSIS — F419 Anxiety disorder, unspecified: Secondary | ICD-10-CM

## 2021-06-24 NOTE — Progress Notes (Deleted)
Depression screen [facility] 2/9 06/19/2021 10/29/2020 06/24/2020 11/16/2019 08/02/2019  Decreased Interest 0 0 '2 1 3  '$ Down, Depressed, Hopeless 0 0 '3 1 3  '$ PHQ - 2 Score 0 0 '5 2 6  '$ Altered sleeping - - '3 3 3  '$ Tired, decreased energy - - 0 3 3  Change in appetite - - 0 0 3  Feeling bad or failure about yourself  - - 0 1 3  Trouble concentrating - - 0 1 3  Moving slowly or fidgety/restless - - 0 0 0  Suicidal thoughts - - 0 0 3  PHQ-9 Score - - '8 10 24  '$ Difficult doing work/chores - - Not difficult at all - Extremely dIfficult  Some recent data might be hidden

## 2021-06-24 NOTE — Progress Notes (Signed)
Depression screen [facility] 2/9 06/19/2021 10/29/2020 06/24/2020 11/16/2019 08/02/2019  Decreased Interest 0 0 '2 1 3  '$ Down, Depressed, Hopeless 0 0 '3 1 3  '$ PHQ - 2 Score 0 0 '5 2 6  '$ Altered sleeping - - '3 3 3  '$ Tired, decreased energy - - 0 3 3  Change in appetite - - 0 0 3  Feeling bad or failure about yourself  - - 0 1 3  Trouble concentrating - - 0 1 3  Moving slowly or fidgety/restless - - 0 0 0  Suicidal thoughts - - 0 0 3  PHQ-9 Score - - '8 10 24  '$ Difficult doing work/chores - - Not difficult at all - Extremely dIfficult  Some recent data might be hidden

## 2021-06-24 NOTE — Telephone Encounter (Signed)
error 

## 2021-06-29 ENCOUNTER — Other Ambulatory Visit: Payer: Self-pay

## 2021-07-02 ENCOUNTER — Other Ambulatory Visit: Payer: Self-pay

## 2021-07-03 ENCOUNTER — Other Ambulatory Visit (HOSPITAL_BASED_OUTPATIENT_CLINIC_OR_DEPARTMENT_OTHER): Payer: Self-pay

## 2021-08-07 ENCOUNTER — Ambulatory Visit (HOSPITAL_BASED_OUTPATIENT_CLINIC_OR_DEPARTMENT_OTHER): Payer: Self-pay | Attending: Nurse Practitioner | Admitting: Internal Medicine

## 2021-08-07 ENCOUNTER — Other Ambulatory Visit: Payer: Self-pay

## 2021-08-07 ENCOUNTER — Encounter (HOSPITAL_BASED_OUTPATIENT_CLINIC_OR_DEPARTMENT_OTHER): Payer: Self-pay | Admitting: Internal Medicine

## 2021-08-07 VITALS — Ht 65.0 in | Wt 182.0 lb

## 2021-08-07 DIAGNOSIS — G4733 Obstructive sleep apnea (adult) (pediatric): Secondary | ICD-10-CM | POA: Insufficient documentation

## 2021-08-15 NOTE — Progress Notes (Signed)
Patient ID: Michelle Gibson, female    DOB: [birth year]  MRN: [id number]  CC: Diabetes Follow-Up   Subjective: Michelle Gibson is a 45 y.o. female who presents for diabetes follow-up.   Her concerns today include:   DIABETES TYPE 2 FOLLOW-UP: 06/19/2021: - Hemoglobin A1c last obtained 06/09/2021 and not at goal at 10.1% at that time, goal < 7%. Next hemoglobin A1c due November 2022.  - Continue Metformin, Insulin Lispro, Insulin Glargine, and Gabapentin as prescribed. No medication refills needed at the moment.  08/18/2021: Doing well on current regimen, no issues/concerns. Monitoring what she eats. Home blood sugars 200's during the day and 300's in the evenings. Reports Gabapentin for bilateral feet neuropathy helping but not as much as she would like.  2. HEADACHE: History of headaches but recently worsening the last few weeks. Endorses headache at least every other day and with nausea/vomiting. Has an aura prior to headache. When actively having headache able to feel a spongy knot at nape of neck. Denies trauma and/or injury. Denies change in vision. Taking Ibuprofen helps some.    Patient Active Problem List   Diagnosis Date Noted   Diabetes mellitus with hyperglycemia ([place]) 06/08/2021   Uncontrolled type 2 diabetes mellitus with hyperglycemia ([place]) 06/08/2021   Nausea 06/08/2021   Hyperlipidemia 06/08/2021   History of COVID-19 03/19/2021   Tachycardia 03/19/2021   Palpitations 03/19/2021   Cough 03/19/2021   Complete tear of right ACL, initial encounter 12/10/2020   Acute tear lateral meniscus, right, initial encounter 08/29/2019   Acute medial meniscus tear of right knee 08/22/2019   Fever and chills 11/17/2018   Closed fracture of upper end of fibula 09/21/2018   WPW (Wolff-Parkinson-White syndrome) 10/10/2017   Asthma with acute exacerbation 09/15/2016   Hyperglycemia 09/15/2016   History of Wolff-Parkinson-White (WPW) syndrome 09/15/2016   Acute respiratory failure with  hypoxia ([place]) 09/15/2016   Bronchospasm 09/11/2016     Current Outpatient Medications on File Prior to Visit  Medication Sig Dispense Refill   atorvastatin (LIPITOR) 20 MG tablet Take 1 tablet (20 mg total) by mouth daily. 90 tablet 3   Blood Glucose Monitoring Suppl (ONETOUCH VERIO REFLECT) w/Device KIT USE TO CHECK BLOOD SUGAR 3 TIMES DAILY 1 kit 0   Blood Glucose Monitoring Suppl (ONETOUCH VERIO) w/Device KIT Use to check blood sugar TID. 1 kit 0   Continuous Blood Gluc Receiver (DEXCOM G6 RECEIVER) DEVI 1 each by Does not apply route 4 (four) times daily -  before meals and at bedtime. 1 each 0   Continuous Blood Gluc Sensor (DEXCOM G6 SENSOR) MISC 1 each by Does not apply route 4 (four) times daily -  before meals and at bedtime. 1 each 1   Continuous Blood Gluc Transmit (DEXCOM G6 TRANSMITTER) MISC 1 each by Does not apply route 4 (four) times daily -  before meals and at bedtime. 1 each 1   glucose blood test strip USE TO CHECK BLOOD SUGAR 3 TIMES DAILY 100 strip 2   hydrOXYzine (VISTARIL) 25 MG capsule Take 1 capsule (25 mg total) by mouth every 8 (eight) hours as needed. 60 capsule 1   insulin aspart (NOVOLOG) 100 UNIT/ML injection Inject 4 Units into the skin 3 (three) times daily with meals. 10 mL 11   ipratropium-albuterol (DUONEB) 0.5-2.5 (3) MG/3ML SOLN Take 3 mLs by nebulization every 6 (six) hours as needed (wheezing).     Lancets (ONETOUCH DELICA PLUS [id number]) MISC USE TO CHECK BLOOD SUGAR 3 TIMES DAILY  100 each 2   nystatin cream (MYCOSTATIN) Apply topically 2 (two) times daily as needed (apply to rash). 30 g 0   OneTouch Delica Lancets 97C MISC Use to check blood sugar TID. 100 each 2   sertraline (ZOLOFT) 100 MG tablet TAKE 1 TABLET (100 MG TOTAL) BY MOUTH DAILY. 90 tablet 1   topiramate (TOPAMAX) 50 MG tablet Take 1 tablet (50 mg total) by mouth daily as needed (migraine).     [DISCONTINUED] metoCLOPramide (REGLAN) 10 MG tablet Take 1 tablet (10 mg total) by mouth every 8  (eight) hours as needed for nausea or vomiting (headache). (Patient not taking: Reported on 07/14/2020) 5 tablet 0   [DISCONTINUED] traZODone (DESYREL) 50 MG tablet Take 0.5-1 tablets (25-50 mg total) by mouth at bedtime as needed for sleep. 30 tablet 3   No current facility-administered medications on file prior to visit.    Allergies  Allergen Reactions   Ciprofloxacin Hives   Lyrica Cr [Pregabalin Er] Other (See Comments)    Suicidal ideation   Doxycycline Rash    Yeast infection   Flecainide Hives, Nausea And Vomiting and Rash   Latex Rash and Other (See Comments)    Causes blisters, also   Penicillin G Rash    Has patient had a PCN reaction causing immediate rash, facial/tongue/throat swelling, SOB or lightheadedness with hypotension: yes Has patient had a PCN reaction causing severe rash involving mucus membranes or skin necrosis: yes Has patient had a PCN reaction that required hospitalization: no Has patient had a PCN reaction occurring within the last 10 years: no If all of the above answers are "NO", then may proceed with Cephalosporin use.     Social History   Socioeconomic History   Marital status: Divorced    Spouse name: Not on file   Number of children: Not on file   Years of education: Not on file   Highest education level: Not on file  Occupational History   Not on file  Tobacco Use   Smoking status: Former    Packs/day: 0.10    Types: Cigarettes   Smokeless tobacco: Never  Vaping Use   Vaping Use: Never used  Substance and Sexual Activity   Alcohol use: No   Drug use: No   Sexual activity: Not on file  Other Topics Concern   Not on file  Social History Narrative   Not on file   Social Determinants of Health   Financial Resource Strain: Not on file  Food Insecurity: Not on file  Transportation Needs: Not on file  Physical Activity: Not on file  Stress: Not on file  Social Connections: Not on file  Intimate Partner Violence: Not on file     Family History  Problem Relation Age of Onset   Hypertension Mother    CAD Mother 31   Diabetes Mother    Hypertension Father    CAD Father 32       CABG    Past Surgical History:  Procedure Laterality Date   ABDOMINAL HYSTERECTOMY     CARDIAC SURGERY     CESAREAN SECTION     KNEE ARTHROSCOPY WITH LATERAL MENISECTOMY Right 08/29/2019   Procedure: PARTIAL LATERAL MENISECTOMY;  Surgeon: Leandrew Koyanagi, MD;  Location: [facility];  Service: Orthopedics;  Laterality: Right;   KNEE ARTHROSCOPY WITH MEDIAL MENISECTOMY Right 08/29/2019   Procedure: RIGHT KNEE ARTHROSCOPY WITH PARTIAL MEDIAL MENISECTOMY;  Surgeon: Leandrew Koyanagi, MD;  Location: [facility];  Service: Orthopedics;  Laterality: Right;    ROS: Review of Systems Negative except as stated above  PHYSICAL EXAM: BP 118/87 (BP Location: Left Arm, Patient Position: Sitting, Cuff Size: Large)   Pulse 89   Temp 98.4 F (36.9 C)   Resp 18   Ht 5' 5.98" (1.676 m)   Wt 184 lb 6.4 oz (83.6 kg)   SpO2 98%   BMI 29.78 kg/m   Physical Exam HENT:     Head: Normocephalic and atraumatic.     Right Ear: Tympanic membrane, ear canal and external ear normal.     Left Ear: Tympanic membrane, ear canal and external ear normal.  Eyes:     Extraocular Movements: Extraocular movements intact.     Conjunctiva/sclera: Conjunctivae normal.     Pupils: Pupils are equal, round, and reactive to light.  Cardiovascular:     Rate and Rhythm: Normal rate and regular rhythm.     Pulses: Normal pulses.     Heart sounds: Normal heart sounds.  Pulmonary:     Effort: Pulmonary effort is normal.     Breath sounds: Normal breath sounds.  Musculoskeletal:     Cervical back: Normal range of motion and neck supple.  Neurological:     General: No focal deficit present.     Mental Status: She is alert and oriented to person, place, and time.  Psychiatric:        Mood and Affect: Mood normal.        Behavior: Behavior  normal.   Results for orders placed or performed in visit on 08/18/21  POCT glycosylated hemoglobin (Hb A1C)  Result Value Ref Range   Hemoglobin A1C 8.5 (A) 4.0 - 5.6 %   HbA1c POC (<> result, manual entry)     HbA1c, POC (prediabetic range)     HbA1c, POC (controlled diabetic range)      ASSESSMENT AND PLAN: 1. Type 2 diabetes mellitus with diabetic mononeuropathy, with long-term current use of insulin ([place]): - Hemoglobin A1c today not at goal at 8.5%, goal < 7%. However, this is improved from previous 10.1% on 06/08/2021. - Continue Insulin Glargine and Insulin Lispro as prescribed.  - Increase Metformin from 500 mg twice daily to 1000 mg twice daily.  - Increase Gabapentin from three times daily to four times daily.  - Discussed the importance of healthy eating habits, low-carbohydrate diet, low-sugar diet, regular aerobic exercise (at least 150 minutes a week as tolerated) and medication compliance to achieve or maintain control of diabetes. - Follow-up with primary provider in 4 weeks or sooner if needed.  - POCT glycosylated hemoglobin (Hb A1C) - metFORMIN (GLUCOPHAGE) 1000 MG tablet; Take 0.5 tablets (500 mg total) by mouth 2 (two) times daily with a meal.  Dispense: 90 tablet; Refill: 0 - insulin lispro (HUMALOG) 100 UNIT/ML KwikPen; Inject 4 units three times daily with meals and sliding scale instructions three times daily. CBG 70 - 120: 0 units CBG 121 - 150: 2 units CBG 151 - 200: 3 units CBG 201 - 250: 5 units CBG 251 - 300: 8 units CBG 301 - 350: 11 units CBG 351 - 400: 15 units  Dispense: 15 mL; Refill: 3 - insulin glargine (LANTUS) 100 UNIT/ML Solostar Pen; Inject 20 Units into the skin daily.  Dispense: 18 mL; Refill: 0 - gabapentin (NEURONTIN) 300 MG capsule; Take 2 capsules (600 mg total) by mouth 4 (four) times daily.  Dispense: 180 capsule; Refill: 3 - Insulin Pen Needle 31G X 5  MM MISC; USE AS INSTRUCTED. CHECK BLOOD GLUCOSE LEVEL BY FINGERSTICK TWICE PER DAY.   Dispense: 100 each; Refill: 6  2. Migraine with aura and without status migrainosus, not intractable: - Begin Sumatriptan as prescribed.  - MR Brain for further evaluation.  - CMP14+EGFR to check kidney function, liver function, and electrolyte balance.  - CBC to screen for anemia. - Follow-up with primary provider in 4 weeks or sooner if needed.  - CMP14+EGFR - CBC - MR Brain W Wo Contrast; Future - SUMAtriptan (IMITREX) 25 MG tablet; Take 25 mg (1 tablet) total by mouth at the start of the headache. May repeat in 2 hours x 1 if headache persists. Max of 2 tabs/24 hours  Dispense: 30 tablet; Refill: 0  3. Need for 23-polyvalent pneumococcal polysaccharide vaccine: - Administered today in office.  - Pneumococcal polysaccharide vaccine 23-valent greater than or equal to 2yo subcutaneous/IM  4. Need for influenza vaccination: - Administered today in office.  - Flu Vaccine QUAD 78moIM (Fluarix, Fluzone & Alfiuria Quad PF)    Patient was given the opportunity to ask questions.  Patient verbalized understanding of the plan and was able to repeat key elements of the plan. Patient was given clear instructions to go to Emergency Department or return to medical center if symptoms don't improve, worsen, or new problems develop.The patient verbalized understanding.   Orders Placed This Encounter  Procedures   MR Brain W Wo Contrast   Pneumococcal polysaccharide vaccine 23-valent greater than or equal to 2yo subcutaneous/IM   Flu Vaccine QUAD 683moM (Fluarix, Fluzone & Alfiuria Quad PF)   CMP14+EGFR   CBC   POCT glycosylated hemoglobin (Hb A1C)     Requested Prescriptions   Signed Prescriptions Disp Refills   SUMAtriptan (IMITREX) 25 MG tablet 30 tablet 0    Sig: Take 25 mg (1 tablet) total by mouth at the start of the headache. May repeat in 2 hours x 1 if headache persists. Max of 2 tabs/24 hours   metFORMIN (GLUCOPHAGE) 1000 MG tablet 90 tablet 0    Sig: Take 0.5 tablets (500 mg  total) by mouth 2 (two) times daily with a meal.   insulin lispro (HUMALOG) 100 UNIT/ML KwikPen 15 mL 3    Sig: Inject 4 units three times daily with meals and sliding scale instructions three times daily. CBG 70 - 120: 0 units CBG 121 - 150: 2 units CBG 151 - 200: 3 units CBG 201 - 250: 5 units CBG 251 - 300: 8 units CBG 301 - 350: 11 units CBG 351 - 400: 15 units   insulin glargine (LANTUS) 100 UNIT/ML Solostar Pen 18 mL 0    Sig: Inject 20 Units into the skin daily.   gabapentin (NEURONTIN) 300 MG capsule 180 capsule 3    Sig: Take 2 capsules (600 mg total) by mouth 4 (four) times daily.   Insulin Pen Needle 31G X 5 MM MISC 100 each 6    Sig: USE AS INSTRUCTED. CHECK BLOOD GLUCOSE LEVEL BY FINGERSTICK TWICE PER DAY.    Return in about 4 weeks (around 09/15/2021) for Follow-Up or next available diabetes and headaches .  AmCamillia HerterNP

## 2021-08-16 DIAGNOSIS — G4733 Obstructive sleep apnea (adult) (pediatric): Secondary | ICD-10-CM

## 2021-08-16 NOTE — Procedures (Signed)
     Patient Name: Michelle Gibson, Friscia Date: 08/07/2021 Gender: Female D.O.B: 04-05-1976 Age (years): 60 Referring Provider: Gildardo Pounds NP Height (inches): 34 Interpreting Physician: Baird Lyons MD, ABSM Weight (lbs): 182 RPSGT: Baxter Flattery BMI: 30 MRN: [id number] Neck Size: 14.00  CLINICAL INFORMATION Sleep Study Type: NPSG Indication for sleep study: Snoring, Witnesses Apnea / Gasping During Sleep Epworth Sleepiness Score: 5  SLEEP STUDY TECHNIQUE As per the AASM Manual for the Scoring of Sleep and Associated Events v2.3 (April 2016) with a hypopnea requiring 4% desaturations.  The channels recorded and monitored were frontal, central and occipital EEG, electrooculogram (EOG), submentalis EMG (chin), nasal and oral airflow, thoracic and abdominal wall motion, anterior tibialis EMG, snore microphone, electrocardiogram, and pulse oximetry.  MEDICATIONS Medications self-administered by patient taken the night of the study : GABAPENTIN  SLEEP ARCHITECTURE The study was initiated at 10:29:49 PM and ended at 5:00:45 AM.  Sleep onset time was 34.3 minutes and the sleep efficiency was 88.0%%. The total sleep time was 344.2 minutes.  Stage REM latency was 119.5 minutes.  The patient spent 1.0%% of the night in stage N1 sleep, 64.7%% in stage N2 sleep, 24.6%% in stage N3 and 9.7% in REM.  Alpha intrusion was absent.  Supine sleep was 62.02%.  RESPIRATORY PARAMETERS The overall apnea/hypopnea index (AHI) was 7.0 per hour. There were 18 total apneas, including 12 obstructive, 6 central and 0 mixed apneas. There were 22 hypopneas and 0 RERAs.  The AHI during Stage REM sleep was 5.4 per hour.  AHI while supine was 5.1 per hour.  The mean oxygen saturation was 91.7%. The minimum SpO2 during sleep was 84.0%.  loud snoring was noted during this study.  CARDIAC DATA The 2 lead EKG demonstrated sinus rhythm. The mean heart rate was 92.5 beats per minute. Other EKG findings  include: None.  LEG MOVEMENT DATA The total PLMS were 0 with a resulting PLMS index of 0.0. Associated arousal with leg movement index was 0.0 .  IMPRESSIONS - Mild obstructive sleep apnea occurred during this study (AHI = 7.0/h). - No significant central sleep apnea occurred during this study (CAI = 1/h). - Mild oxygen desaturation was noted during this study (Min O2 = 84.0%). Mean saturation 91.7%. - The patient snored with loud snoring volume. - No cardiac abnormalities were noted during this study. Mean HR 92.5/ minute. - Clinically significant periodic limb movements did not occur during sleep. No significant associated arousals.  DIAGNOSIS - Obstructive Sleep Apnea (G47.33)  RECOMMENDATIONS - Treatment for mild OSA is directed by symptoms and co-morbiditiies. Conservative measures may include observation, weight loss and sleep postition off back. Other options would be based on clinical judgment. - Be careful with alcohol, sedatives and other CNS depressants that may worsen sleep apnea and disrupt normal sleep architecture. - Sleep hygiene should be reviewed to assess factors that may improve sleep quality. - Weight management and regular exercise should be initiated or continued if appropriate.  [Electronically signed] 08/16/2021 10:22 AM  Baird Lyons MD, ABSM Diplomate, American Board of Sleep Medicine   NPI: [id number]                        [place] [place] [facility]  ELECTRONICALLY SIGNED ON:  08/16/2021, 10:16 AM [place] PH: [phone]   FX: [phone] [place]

## 2021-08-18 ENCOUNTER — Ambulatory Visit (INDEPENDENT_AMBULATORY_CARE_PROVIDER_SITE_OTHER): Payer: Self-pay | Admitting: Family

## 2021-08-18 ENCOUNTER — Other Ambulatory Visit: Payer: Self-pay

## 2021-08-18 ENCOUNTER — Encounter: Payer: Self-pay | Admitting: Family

## 2021-08-18 VITALS — BP 118/87 | HR 89 | Temp 98.4°F | Resp 18 | Ht 65.98 in | Wt 184.4 lb

## 2021-08-18 DIAGNOSIS — E1165 Type 2 diabetes mellitus with hyperglycemia: Secondary | ICD-10-CM

## 2021-08-18 DIAGNOSIS — G43109 Migraine with aura, not intractable, without status migrainosus: Secondary | ICD-10-CM

## 2021-08-18 DIAGNOSIS — Z23 Encounter for immunization: Secondary | ICD-10-CM

## 2021-08-18 DIAGNOSIS — Z794 Long term (current) use of insulin: Secondary | ICD-10-CM

## 2021-08-18 DIAGNOSIS — G43909 Migraine, unspecified, not intractable, without status migrainosus: Secondary | ICD-10-CM | POA: Insufficient documentation

## 2021-08-18 DIAGNOSIS — E1141 Type 2 diabetes mellitus with diabetic mononeuropathy: Secondary | ICD-10-CM

## 2021-08-18 LAB — POCT GLYCOSYLATED HEMOGLOBIN (HGB A1C): Hemoglobin A1C: 8.5 % — AB (ref 4.0–5.6)

## 2021-08-18 MED ORDER — INSULIN LISPRO (1 UNIT DIAL) 100 UNIT/ML (KWIKPEN)
PEN_INJECTOR | SUBCUTANEOUS | 3 refills | Status: DC
  Filled 2021-08-18 – 2022-06-23 (×2): qty 15, 33d supply, fill #0

## 2021-08-18 MED ORDER — INSULIN PEN NEEDLE 31G X 5 MM MISC
6 refills | Status: AC
  Filled 2021-08-18: qty 100, 50d supply, fill #0
  Filled 2022-06-23: qty 100, 25d supply, fill #0

## 2021-08-18 MED ORDER — INSULIN GLARGINE 100 UNIT/ML SOLOSTAR PEN
20.0000 [IU] | PEN_INJECTOR | Freq: Every day | SUBCUTANEOUS | 0 refills | Status: DC
  Filled 2021-08-18 – 2022-06-23 (×2): qty 6, 30d supply, fill #0

## 2021-08-18 MED ORDER — GABAPENTIN 300 MG PO CAPS
600.0000 mg | ORAL_CAPSULE | Freq: Four times a day (QID) | ORAL | 3 refills | Status: DC
  Filled 2021-08-18: qty 180, 23d supply, fill #0
  Filled 2021-09-07: qty 180, 23d supply, fill #1
  Filled 2021-11-12: qty 180, 23d supply, fill #2
  Filled 2021-11-12: qty 180, 23d supply, fill #0
  Filled 2021-12-09 – 2021-12-18 (×2): qty 180, 23d supply, fill #1

## 2021-08-18 MED ORDER — METFORMIN HCL 1000 MG PO TABS
500.0000 mg | ORAL_TABLET | Freq: Two times a day (BID) | ORAL | 0 refills | Status: DC
  Filled 2021-08-18: qty 30, 30d supply, fill #0

## 2021-08-18 MED ORDER — SUMATRIPTAN SUCCINATE 25 MG PO TABS
ORAL_TABLET | ORAL | 0 refills | Status: DC
  Filled 2021-08-18: qty 10, 30d supply, fill #0

## 2021-08-18 NOTE — Progress Notes (Signed)
Diabetes discussed in office.

## 2021-08-18 NOTE — Progress Notes (Signed)
Pt presents for diabetes follow-u, pt complains of swelling at posterior of head that comes and goes, symptoms include pain, headache, vomiting, and nausea

## 2021-08-19 LAB — CBC
Hematocrit: 44.1 % (ref 34.0–46.6)
Hemoglobin: 14.7 g/dL (ref 11.1–15.9)
MCH: 28.9 pg (ref 26.6–33.0)
MCHC: 33.3 g/dL (ref 31.5–35.7)
MCV: 87 fL (ref 79–97)
Platelets: 430 10*3/uL (ref 150–450)
RBC: 5.09 x10E6/uL (ref 3.77–5.28)
RDW: 13.1 % (ref 11.7–15.4)
WBC: 7.2 10*3/uL (ref 3.4–10.8)

## 2021-08-19 LAB — CMP14+EGFR
ALT: 12 IU/L (ref 0–32)
AST: 14 IU/L (ref 0–40)
Albumin/Globulin Ratio: 1.7 (ref 1.2–2.2)
Albumin: 5 g/dL — ABNORMAL HIGH (ref 3.8–4.8)
Alkaline Phosphatase: 115 IU/L (ref 44–121)
BUN/Creatinine Ratio: 18 (ref 9–23)
BUN: 18 mg/dL (ref 6–24)
Bilirubin Total: <0.2 mg/dL (ref 0.0–1.2)
CO2: 22 mmol/L (ref 20–29)
Calcium: 10.3 mg/dL — ABNORMAL HIGH (ref 8.7–10.2)
Chloride: 99 mmol/L (ref 96–106)
Creatinine, Ser: 0.98 mg/dL (ref 0.57–1.00)
Globulin, Total: 3 g/dL (ref 1.5–4.5)
Glucose: 143 mg/dL — ABNORMAL HIGH (ref 70–99)
Potassium: 4.2 mmol/L (ref 3.5–5.2)
Sodium: 141 mmol/L (ref 134–144)
Total Protein: 8 g/dL (ref 6.0–8.5)
eGFR: 73 mL/min/{1.73_m2} (ref >59)

## 2021-08-19 NOTE — Progress Notes (Signed)
Kidney function normal.   Liver function normal.   No anemia.

## 2021-08-21 ENCOUNTER — Other Ambulatory Visit: Payer: Self-pay

## 2021-08-28 ENCOUNTER — Ambulatory Visit (HOSPITAL_COMMUNITY): Payer: Medicaid Other

## 2021-09-05 ENCOUNTER — Encounter: Payer: Self-pay | Admitting: *Deleted

## 2021-09-05 ENCOUNTER — Other Ambulatory Visit: Payer: Self-pay

## 2021-09-05 ENCOUNTER — Ambulatory Visit
Admission: EM | Admit: 2021-09-05 | Discharge: 2021-09-05 | Disposition: A | Discharge status: Home / Self Care | Payer: Medicaid Other | Attending: Physician Assistant | Admitting: Physician Assistant

## 2021-09-05 DIAGNOSIS — R509 Fever, unspecified: Secondary | ICD-10-CM

## 2021-09-05 DIAGNOSIS — Z20822 Contact with and (suspected) exposure to covid-19: Secondary | ICD-10-CM

## 2021-09-05 DIAGNOSIS — J111 Influenza due to unidentified influenza virus with other respiratory manifestations: Secondary | ICD-10-CM

## 2021-09-05 LAB — POCT INFLUENZA A/B
Influenza A, POC: POSITIVE — AB
Influenza B, POC: NEGATIVE

## 2021-09-05 MED ORDER — OSELTAMIVIR PHOSPHATE 75 MG PO CAPS
75.0000 mg | ORAL_CAPSULE | Freq: Two times a day (BID) | ORAL | 0 refills | Status: DC
  Filled 2021-09-05: qty 10, 5d supply, fill #0

## 2021-09-05 NOTE — ED Triage Notes (Signed)
C/O cough, congestion, no taste, body aches x approx 1 wk; Now running fevers up to 105 with stomach ache.  States son has tested positive for Flu A&B and Covid. Has been taking IBU and Tyl.

## 2021-09-05 NOTE — ED Provider Notes (Signed)
[name]    CSN: [id number] Arrival date & time: 09/05/21  0802      History   Chief Complaint Chief Complaint  Patient presents with   Fever    HPI Michelle Gibson is a 45 y.o. female.   Patient here today for evaluation of loss of taste, fever, congestion, abdominal pain that started about a week ago.  She states that fevers have recently become more significant.  She has been taking over-the-counter medicine that which has helped some with fever control.  Her son recently tested positive for both flu and COVID.  Denies any vomiting or diarrhea.  The history is provided by the patient.  Fever Associated symptoms: congestion, cough, ear pain ("itchy" pain per patient) and sore throat   Associated symptoms: no diarrhea, no nausea and no vomiting    Past Medical History:  Diagnosis Date   Acute tear lateral meniscus, right, initial encounter 08/29/2019   Asthma    Closed fracture of upper end of fibula 09/21/2018   Diabetes mellitus without complication ([place])    TYPE II   Hyperglycemia 09/15/2016   Migraines    WPW (Wolff-Parkinson-White syndrome)    s/p ablation at age 5    Patient Active Problem List   Diagnosis Date Noted   Migraine 08/18/2021   Diabetes mellitus with hyperglycemia ([place]) 06/08/2021   Uncontrolled type 2 diabetes mellitus with hyperglycemia ([place]) 06/08/2021   Nausea 06/08/2021   Hyperlipidemia 06/08/2021   History of COVID-19 03/19/2021   Tachycardia 03/19/2021   Palpitations 03/19/2021   Cough 03/19/2021   Complete tear of right ACL, initial encounter 12/10/2020   Acute tear lateral meniscus, right, initial encounter 08/29/2019   Acute medial meniscus tear of right knee 08/22/2019   Fever and chills 11/17/2018   Closed fracture of upper end of fibula 09/21/2018   WPW (Wolff-Parkinson-White syndrome) 10/10/2017   Asthma with acute exacerbation 09/15/2016   Hyperglycemia 09/15/2016   History of Wolff-Parkinson-White (WPW)  syndrome 09/15/2016   Acute respiratory failure with hypoxia ([place]) 09/15/2016   Bronchospasm 09/11/2016    Past Surgical History:  Procedure Laterality Date   ABDOMINAL HYSTERECTOMY     CARDIAC SURGERY     CESAREAN SECTION     KNEE ARTHROSCOPY WITH LATERAL MENISECTOMY Right 08/29/2019   Procedure: PARTIAL LATERAL MENISECTOMY;  Surgeon: Leandrew Koyanagi, MD;  Location: [place];  Service: Orthopedics;  Laterality: Right;   KNEE ARTHROSCOPY WITH MEDIAL MENISECTOMY Right 08/29/2019   Procedure: RIGHT KNEE ARTHROSCOPY WITH PARTIAL MEDIAL MENISECTOMY;  Surgeon: Leandrew Koyanagi, MD;  Location: [place];  Service: Orthopedics;  Laterality: Right;    OB History   No obstetric history on file.      Home Medications    Prior to Admission medications   Medication Sig Start Date End Date Taking? Authorizing Provider  atorvastatin (LIPITOR) 20 MG tablet Take 1 tablet (20 mg total) by mouth daily. 05/25/21  Yes Gildardo Pounds, NP  gabapentin (NEURONTIN) 300 MG capsule Take 2 capsules (600 mg total) by mouth 4 (four) times daily. 08/18/21 11/16/21 Yes Minette Brine, Amy J, NP  hydrOXYzine (VISTARIL) 25 MG capsule Take 1 capsule (25 mg total) by mouth every 8 (eight) hours as needed. 06/08/21  Yes Gildardo Pounds, NP  insulin aspart (NOVOLOG) 100 UNIT/ML injection Inject 4 Units into the skin 3 (three) times daily with meals. 06/09/21  Yes Vann, Jessica U, DO  insulin glargine (LANTUS) 100 UNIT/ML Solostar Pen Inject 20  Units into the skin daily. 08/18/21 11/16/21 Yes Minette Brine, Amy J, NP  insulin lispro (HUMALOG) 100 UNIT/ML KwikPen Inject 4 units three times daily with meals and sliding scale instructions three times daily. CBG 70 - 120: 0 units CBG 121 - 150: 2 units CBG 151 - 200: 3 units CBG 201 - 250: 5 units CBG 251 - 300: 8 units CBG 301 - 350: 11 units CBG 351 - 400: 15 units 08/18/21  Yes Minette Brine, Amy J, NP  metFORMIN (GLUCOPHAGE) 1000 MG tablet Take 0.5 tablets (500 mg  total) by mouth 2 (two) times daily with a meal. 08/18/21 11/16/21 Yes Minette Brine, Amy J, NP  oseltamivir (TAMIFLU) 75 MG capsule Take 1 capsule (75 mg total) by mouth every 12 (twelve) hours. 09/05/21  Yes Francene Finders, PA-C  sertraline (ZOLOFT) 100 MG tablet TAKE 1 TABLET (100 MG TOTAL) BY MOUTH DAILY. 05/25/21 05/25/22 Yes Gildardo Pounds, NP  Blood Glucose Monitoring Suppl (ONETOUCH VERIO REFLECT) w/Device KIT USE TO CHECK BLOOD SUGAR 3 TIMES DAILY 12/10/20 12/10/21  Charlott Rakes, MD  Blood Glucose Monitoring Suppl (ONETOUCH VERIO) w/Device KIT Use to check blood sugar TID. 12/10/20   Charlott Rakes, MD  Continuous Blood Gluc Receiver (DEXCOM G6 RECEIVER) DEVI 1 each by Does not apply route 4 (four) times daily -  before meals and at bedtime. 06/19/21   Camillia Herter, NP  Continuous Blood Gluc Sensor (DEXCOM G6 SENSOR) MISC 1 each by Does not apply route 4 (four) times daily -  before meals and at bedtime. 06/19/21   Camillia Herter, NP  Continuous Blood Gluc Transmit (DEXCOM G6 TRANSMITTER) MISC 1 each by Does not apply route 4 (four) times daily -  before meals and at bedtime. 06/19/21   Camillia Herter, NP  glucose blood test strip USE TO CHECK BLOOD SUGAR 3 TIMES DAILY 05/25/21 05/25/22  Gildardo Pounds, NP  Insulin Pen Needle 31G X 5 MM MISC USE AS INSTRUCTED. CHECK BLOOD GLUCOSE LEVEL BY FINGERSTICK TWICE PER DAY. 08/18/21 08/18/22  Camillia Herter, NP  ipratropium-albuterol (DUONEB) 0.5-2.5 (3) MG/3ML SOLN Take 3 mLs by nebulization every 6 (six) hours as needed (wheezing).    [provider]  Lancets (ONETOUCH DELICA PLUS [id number]) MISC USE TO CHECK BLOOD SUGAR 3 TIMES DAILY 12/10/20 12/10/21  Charlott Rakes, MD  nystatin cream (MYCOSTATIN) Apply topically 2 (two) times daily as needed (apply to rash). 06/09/21   Geradine Girt, DO  OneTouch Delica Lancets 34L MISC Use to check blood sugar TID. 12/10/20   Charlott Rakes, MD  SUMAtriptan (IMITREX) 25 MG tablet Take 25 mg (1 tablet) total by mouth  at the start of the headache. May repeat in 2 hours x 1 if headache persists. Max of 2 tabs/24 hours 08/18/21   Camillia Herter, NP  topiramate (TOPAMAX) 50 MG tablet Take 1 tablet (50 mg total) by mouth daily as needed (migraine). 06/09/21 06/09/22  Geradine Girt, DO  metoCLOPramide (REGLAN) 10 MG tablet Take 1 tablet (10 mg total) by mouth every 8 (eight) hours as needed for nausea or vomiting (headache). Patient not taking: Reported on 07/14/2020 07/07/20 07/17/20  Petrucelli, Glynda Jaeger, PA-C  traZODone (DESYREL) 50 MG tablet Take 0.5-1 tablets (25-50 mg total) by mouth at bedtime as needed for sleep. 06/04/19 07/13/19  Antony Blackbird, MD    Family History Family History  Problem Relation Age of Onset   Hypertension Mother    CAD Mother 40   Diabetes Mother  Hypertension Father    CAD Father 66       CABG    Social History Social History   Tobacco Use   Smoking status: Some Days    Packs/day: 0.10    Types: Cigarettes   Smokeless tobacco: Never  Vaping Use   Vaping Use: Never used  Substance Use Topics   Alcohol use: No   Drug use: No     Allergies   Ciprofloxacin, Lyrica cr [pregabalin er], Doxycycline, Flecainide, Latex, and Penicillin g   Review of Systems Review of Systems  Constitutional:  Positive for fever.  HENT:  Positive for congestion, ear pain ("itchy" pain per patient), sinus pressure and sore throat.   Eyes:  Negative for discharge and redness.  Respiratory:  Positive for cough. Negative for shortness of breath and wheezing.   Gastrointestinal:  Positive for abdominal pain. Negative for diarrhea, nausea and vomiting.    Physical Exam Triage Vital Signs ED Triage Vitals  Enc Vitals Group     BP 09/05/21 0808 129/84     Pulse Rate 09/05/21 0808 93     Resp 09/05/21 0808 18     Temp 09/05/21 0808 98.4 F (36.9 C)     Temp Source 09/05/21 0808 Temporal     SpO2 09/05/21 0808 97 %     Weight --      Height --      Head Circumference --      Peak  Flow --      Pain Score 09/05/21 0810 2     Pain Loc --      Pain Edu? --      Excl. in Lane? --    No data found.  Updated Vital Signs BP 129/84   Pulse 93   Temp 98.4 F (36.9 C) (Temporal)   Resp 18   SpO2 97%      Physical Exam Vitals and nursing note reviewed.  Constitutional:      General: She is not in acute distress.    Appearance: Normal appearance. She is not ill-appearing.  HENT:     Head: Normocephalic and atraumatic.     Right Ear: Tympanic membrane normal.     Left Ear: Tympanic membrane normal.     Nose: Congestion present.     Mouth/Throat:     Mouth: Mucous membranes are moist.     Pharynx: No oropharyngeal exudate or posterior oropharyngeal erythema.  Eyes:     Conjunctiva/sclera: Conjunctivae normal.  Cardiovascular:     Rate and Rhythm: Normal rate and regular rhythm.     Heart sounds: Normal heart sounds. No murmur heard. Pulmonary:     Effort: Pulmonary effort is normal. No respiratory distress.     Breath sounds: Normal breath sounds. No wheezing, rhonchi or rales.  Skin:    General: Skin is warm and dry.  Neurological:     Mental Status: She is alert.  Psychiatric:        Mood and Affect: Mood normal.        Thought Content: Thought content normal.     UC Treatments / Results  Labs (all labs ordered are listed, but only abnormal results are displayed) Labs Reviewed  POCT INFLUENZA A/B - Abnormal; Notable for the following components:      Result Value   Influenza A, POC Positive (*)    All other components within normal limits  NOVEL CORONAVIRUS, NAA    EKG   Radiology No results found.  Procedures  Procedures (including critical care time)  Medications Ordered in UC Medications - No data to display  Initial Impression / Assessment and Plan / UC Course  I have reviewed the triage vital signs and the nursing notes.  Pertinent labs & imaging results that were available during my care of the patient were reviewed by me and  considered in my medical decision making (see chart for details).  Flu test positive in office.  Given known exposure will also screen for COVID.  Tamiflu prescribed and recommended symptomatic treatment otherwise.  Encouraged follow-up if symptoms fail to improve or worsen.  Final Clinical Impressions(s) / UC Diagnoses   Final diagnoses:  Fever with exposure to COVID-19 virus  Influenza   Discharge Instructions   None    ED Prescriptions     Medication Sig Dispense Auth. Provider   oseltamivir (TAMIFLU) 75 MG capsule Take 1 capsule (75 mg total) by mouth every 12 (twelve) hours. 10 capsule Francene Finders, PA-C      PDMP not reviewed this encounter.   Francene Finders, PA-C 09/05/21 [phone]

## 2021-09-06 LAB — NOVEL CORONAVIRUS, NAA: SARS-CoV-2, NAA: DETECTED — AB

## 2021-09-06 LAB — SARS-COV-2, NAA 2 DAY TAT

## 2021-09-07 ENCOUNTER — Telehealth (INDEPENDENT_AMBULATORY_CARE_PROVIDER_SITE_OTHER): Payer: Self-pay | Admitting: Nurse Practitioner

## 2021-09-07 ENCOUNTER — Other Ambulatory Visit: Payer: Self-pay

## 2021-09-07 ENCOUNTER — Encounter: Payer: Self-pay | Admitting: Nurse Practitioner

## 2021-09-07 ENCOUNTER — Telehealth: Payer: Self-pay | Admitting: Family

## 2021-09-07 DIAGNOSIS — U071 COVID-19: Secondary | ICD-10-CM

## 2021-09-07 MED ORDER — BENZONATATE 100 MG PO CAPS
100.0000 mg | ORAL_CAPSULE | Freq: Two times a day (BID) | ORAL | 0 refills | Status: DC | PRN
  Filled 2021-09-07: qty 20, 10d supply, fill #0

## 2021-09-07 MED ORDER — MOLNUPIRAVIR 200 MG PO CAPS
4.0000 | ORAL_CAPSULE | Freq: Two times a day (BID) | ORAL | 0 refills | Status: AC
  Filled 2021-09-07: qty 40, 5d supply, fill #0

## 2021-09-07 NOTE — Telephone Encounter (Signed)
Tested positive for covid on Saturday and is requested medication for this since she is diabetic. Pt is also wondering if we need to resched the physical appt.

## 2021-09-07 NOTE — Telephone Encounter (Signed)
Patient spoke with NP this am .

## 2021-09-07 NOTE — Addendum Note (Signed)
Addended by: Fenton Foy on: 09/07/2021 02:47 PM   Modules accepted: Orders

## 2021-09-07 NOTE — Progress Notes (Signed)
Virtual Visit via Telephone Note  I connected with Michelle Gibson on 09/07/21 at  9:40 AM EST by telephone and verified that I am speaking with the correct person using two identifiers.  Location: Patient: home Provider: office   I discussed the limitations, risks, security and privacy concerns of performing an evaluation and management service by telephone and the availability of in person appointments. I also discussed with the patient that there may be a patient responsible charge related to this service. The patient expressed understanding and agreed to proceed.   History of Present Illness:  Patient presents today for sick visit through telephone visit.  Patient was seen in the ED on 09/05/2021.  She was seen for flulike symptoms.  She tested positive for both COVID and flu.  She states that her symptoms started on 09/04/2021.  She states that she continues to have congestion and has had fever and loss of taste as well.  She denies any significant shortness of breath.  Patient is diabetic.  She was started on Tamiflu in the ED but has only taken 1 dose of this.  We discussed that it may be more beneficial for her to start oral antiviral treatment for COVID. Denies f/c/s, n/v/d, hemoptysis, PND, chest pain or edema.       Observations/Objective:  Vitals with BMI 09/05/2021 08/18/2021 08/07/2021  Height - 5' 5.984" 5\' 5"   Weight - 184 lbs 6 oz 182 lbs  BMI - 44.31 54.00  Systolic 867 619 -  Diastolic 84 87 -  Pulse 93 89 -     Assessment and Plan:  Covid 19:   Stay well hydrated  Stay active  Deep breathing exercises  May take tylenol or fever or pain  May take mucinex twice daily  You are being prescribed MOLNUPIRAVIR for COVID-19 infection.  [facility] and [place] ([place], [place], [place] #[phone])  Monday through Friday 8a-5:30p   Please call the pharmacy or go through the drive through vs going inside if you are  picking up the mediation yourself to prevent further spread. If prescribed to a [facility] affiliated pharmacy, a pharmacist will bring the medication out to your car.   ADMINISTRATION INSTRUCTIONS: Take with or without food. Swallow the tablets whole. Don't chew, crush, or break the medications because it might not work as well  For each dose of the medication, you should be taking FOUR tablets at one time, TWICE a day   Finish your full five-day course of Molnupiravir even if you feel better before you're done. Stopping this medication too early can make it less effective to prevent severe illness related to [place].    Molnupiravir is prescribed for YOU ONLY. Don't share it with others, even if they have similar symptoms as you. This medication might not be right for everyone.   Make sure to take steps to protect yourself and others while you're taking this medication in order to get well soon and to prevent others from getting sick with COVID-19.   **If you are of childbearing potential (any gender) - it is advised to not get pregnant while taking this medication and recommended that condoms are used for female partners the next 3 months after taking the medication out of extreme caution    COMMON SIDE EFFECTS: Diarrhea Nausea  Dizziness    If your COVID-19 symptoms get worse, get medical help right away. Call 911 if you experience symptoms such as worsening cough, trouble breathing, chest  pain that doesn't go away, confusion, a hard time staying awake, and pale or blue-colored skin. This medication won't prevent all COVID-19 cases from getting worse.      Follow up:  Follow up on Friday      I discussed the assessment and treatment plan with the patient. The patient was provided an opportunity to ask questions and all were answered. The patient agreed with the plan and demonstrated an understanding of the instructions.   The patient was advised to call back or seek an  in-person evaluation if the symptoms worsen or if the condition fails to improve as anticipated.  I provided 23 minutes of non-face-to-face time during this encounter.   Fenton Foy, NP

## 2021-09-07 NOTE — Patient Instructions (Signed)
Covid 19:   Stay well hydrated  Stay active  Deep breathing exercises  May take tylenol or fever or pain  May take mucinex twice daily  You are being prescribed MOLNUPIRAVIR for COVID-19 infection.  [facility] and [place] ([place], [place], [place] #[phone])  Monday through Friday 8a-5:30p   Please call the pharmacy or go through the drive through vs going inside if you are picking up the mediation yourself to prevent further spread. If prescribed to a [facility] affiliated pharmacy, a pharmacist will bring the medication out to your car.   ADMINISTRATION INSTRUCTIONS: Take with or without food. Swallow the tablets whole. Don't chew, crush, or break the medications because it might not work as well  For each dose of the medication, you should be taking FOUR tablets at one time, TWICE a day   Finish your full five-day course of Molnupiravir even if you feel better before you're done. Stopping this medication too early can make it less effective to prevent severe illness related to [place].    Molnupiravir is prescribed for YOU ONLY. Don't share it with others, even if they have similar symptoms as you. This medication might not be right for everyone.   Make sure to take steps to protect yourself and others while you're taking this medication in order to get well soon and to prevent others from getting sick with COVID-19.   **If you are of childbearing potential (any gender) - it is advised to not get pregnant while taking this medication and recommended that condoms are used for female partners the next 3 months after taking the medication out of extreme caution    COMMON SIDE EFFECTS: Diarrhea Nausea  Dizziness    If your COVID-19 symptoms get worse, get medical help right away. Call 911 if you experience symptoms such as worsening cough, trouble breathing, chest pain that doesn't go away, confusion, a hard time staying awake, and  pale or blue-colored skin. This medication won't prevent all COVID-19 cases from getting worse.      Follow up:  Follow up on Friday

## 2021-09-11 ENCOUNTER — Other Ambulatory Visit: Payer: Self-pay

## 2021-09-11 ENCOUNTER — Telehealth: Payer: Self-pay | Admitting: Nurse Practitioner

## 2021-09-12 NOTE — Progress Notes (Signed)
Erroneous encounter

## 2021-09-14 ENCOUNTER — Other Ambulatory Visit: Payer: Self-pay

## 2021-09-15 ENCOUNTER — Encounter: Payer: Self-pay | Admitting: Family

## 2021-09-15 DIAGNOSIS — E1141 Type 2 diabetes mellitus with diabetic mononeuropathy: Secondary | ICD-10-CM

## 2021-09-15 DIAGNOSIS — Z0000 Encounter for general adult medical examination without abnormal findings: Secondary | ICD-10-CM

## 2021-09-15 DIAGNOSIS — Z13220 Encounter for screening for lipoid disorders: Secondary | ICD-10-CM

## 2021-09-15 DIAGNOSIS — G43109 Migraine with aura, not intractable, without status migrainosus: Secondary | ICD-10-CM

## 2021-09-15 DIAGNOSIS — Z13228 Encounter for screening for other metabolic disorders: Secondary | ICD-10-CM

## 2021-09-15 DIAGNOSIS — Z1329 Encounter for screening for other suspected endocrine disorder: Secondary | ICD-10-CM

## 2021-09-15 DIAGNOSIS — Z1159 Encounter for screening for other viral diseases: Secondary | ICD-10-CM

## 2021-09-15 DIAGNOSIS — Z130 Encounter for screening for diseases of the blood and blood-forming organs and certain disorders involving the immune mechanism: Secondary | ICD-10-CM

## 2021-09-15 DIAGNOSIS — Z1231 Encounter for screening mammogram for malignant neoplasm of breast: Secondary | ICD-10-CM

## 2021-10-19 ENCOUNTER — Ambulatory Visit (HOSPITAL_COMMUNITY): Admission: RE | Admit: 2021-10-19 | Payer: Self-pay | Source: Ambulatory Visit

## 2021-10-22 ENCOUNTER — Other Ambulatory Visit: Payer: Self-pay

## 2021-10-22 ENCOUNTER — Emergency Department (HOSPITAL_COMMUNITY)
Admission: EM | Admit: 2021-10-22 | Discharge: 2021-10-22 | Disposition: A | Discharge status: Home / Self Care | Payer: Self-pay | Attending: Emergency Medicine | Admitting: Emergency Medicine

## 2021-10-22 ENCOUNTER — Emergency Department (HOSPITAL_COMMUNITY): Payer: Self-pay

## 2021-10-22 DIAGNOSIS — E119 Type 2 diabetes mellitus without complications: Secondary | ICD-10-CM | POA: Insufficient documentation

## 2021-10-22 DIAGNOSIS — N200 Calculus of kidney: Secondary | ICD-10-CM | POA: Insufficient documentation

## 2021-10-22 DIAGNOSIS — Z91040 Latex allergy status: Secondary | ICD-10-CM | POA: Insufficient documentation

## 2021-10-22 DIAGNOSIS — Z7984 Long term (current) use of oral hypoglycemic drugs: Secondary | ICD-10-CM | POA: Insufficient documentation

## 2021-10-22 DIAGNOSIS — N9489 Other specified conditions associated with female genital organs and menstrual cycle: Secondary | ICD-10-CM | POA: Insufficient documentation

## 2021-10-22 DIAGNOSIS — J45909 Unspecified asthma, uncomplicated: Secondary | ICD-10-CM | POA: Insufficient documentation

## 2021-10-22 DIAGNOSIS — Z794 Long term (current) use of insulin: Secondary | ICD-10-CM | POA: Insufficient documentation

## 2021-10-22 LAB — COMPREHENSIVE METABOLIC PANEL
ALT: 29 U/L (ref 0–44)
AST: 19 U/L (ref 15–41)
Albumin: 3.7 g/dL (ref 3.5–5.0)
Alkaline Phosphatase: 98 U/L (ref 38–126)
Anion gap: 14 (ref 5–15)
BUN: 12 mg/dL (ref 6–20)
CO2: 24 mmol/L (ref 22–32)
Calcium: 9.4 mg/dL (ref 8.9–10.3)
Chloride: 101 mmol/L (ref 98–111)
Creatinine, Ser: 0.93 mg/dL (ref 0.44–1.00)
GFR, Estimated: >60 mL/min (ref >60)
Glucose, Bld: 221 mg/dL — ABNORMAL HIGH (ref 70–99)
Potassium: 3.6 mmol/L (ref 3.5–5.1)
Sodium: 139 mmol/L (ref 135–145)
Total Bilirubin: 0.3 mg/dL (ref 0.3–1.2)
Total Protein: 7 g/dL (ref 6.5–8.1)

## 2021-10-22 LAB — CBC
HCT: 41.8 % (ref 36.0–46.0)
Hemoglobin: 14.1 g/dL (ref 12.0–15.0)
MCH: 29.9 pg (ref 26.0–34.0)
MCHC: 33.7 g/dL (ref 30.0–36.0)
MCV: 88.7 fL (ref 80.0–100.0)
Platelets: 451 10*3/uL — ABNORMAL HIGH (ref 150–400)
RBC: 4.71 MIL/uL (ref 3.87–5.11)
RDW: 13 % (ref 11.5–15.5)
WBC: 10.9 10*3/uL — ABNORMAL HIGH (ref 4.0–10.5)
nRBC: 0 % (ref 0.0–0.2)

## 2021-10-22 LAB — URINALYSIS, ROUTINE W REFLEX MICROSCOPIC
Glucose, UA: NEGATIVE mg/dL
Ketones, ur: NEGATIVE mg/dL
Nitrite: NEGATIVE
Protein, ur: 100 mg/dL — AB
Specific Gravity, Urine: 1.02 (ref 1.005–1.030)
pH: 7 (ref 5.0–8.0)

## 2021-10-22 LAB — URINALYSIS, MICROSCOPIC (REFLEX)

## 2021-10-22 LAB — LIPASE, BLOOD: Lipase: 48 U/L (ref 11–51)

## 2021-10-22 LAB — I-STAT BETA HCG BLOOD, ED (MC, WL, AP ONLY): I-stat hCG, quantitative: <5 m[IU]/mL (ref <5)

## 2021-10-22 MED ORDER — TAMSULOSIN HCL 0.4 MG PO CAPS
0.4000 mg | ORAL_CAPSULE | Freq: Every day | ORAL | 0 refills | Status: DC
  Filled 2021-10-22 – 2022-03-30 (×2): qty 10, 10d supply, fill #0

## 2021-10-22 MED ORDER — SODIUM CHLORIDE 0.9 % IV BOLUS
2000.0000 mL | Freq: Once | INTRAVENOUS | Status: AC
  Administered 2021-10-22: 2000 mL via INTRAVENOUS

## 2021-10-22 MED ORDER — HYDROMORPHONE HCL 1 MG/ML IJ SOLN
0.5000 mg | Freq: Once | INTRAMUSCULAR | Status: AC
  Administered 2021-10-22: 0.5 mg via INTRAVENOUS
  Filled 2021-10-22: qty 1

## 2021-10-22 MED ORDER — OXYCODONE-ACETAMINOPHEN 5-325 MG PO TABS
1.0000 | ORAL_TABLET | Freq: Four times a day (QID) | ORAL | 0 refills | Status: DC | PRN
Start: 2021-10-22 — End: 2021-10-22
  Filled 2021-10-22: qty 20, 5d supply, fill #0

## 2021-10-22 MED ORDER — ONDANSETRON 4 MG PO TBDP
4.0000 mg | ORAL_TABLET | Freq: Once | ORAL | Status: AC
  Administered 2021-10-22: 4 mg via ORAL
  Filled 2021-10-22: qty 1

## 2021-10-22 MED ORDER — ONDANSETRON HCL 4 MG/2ML IJ SOLN
4.0000 mg | Freq: Once | INTRAMUSCULAR | Status: AC
  Administered 2021-10-22: 4 mg via INTRAVENOUS
  Filled 2021-10-22: qty 2

## 2021-10-22 MED ORDER — KETOROLAC TROMETHAMINE 30 MG/ML IJ SOLN
30.0000 mg | Freq: Once | INTRAMUSCULAR | Status: AC
  Administered 2021-10-22: 30 mg via INTRAVENOUS
  Filled 2021-10-22: qty 1

## 2021-10-22 MED ORDER — OXYCODONE-ACETAMINOPHEN 5-325 MG PO TABS: 1.0000 | ORAL_TABLET | Freq: Four times a day (QID) | ORAL | 0 refills | Status: DC | PRN

## 2021-10-22 MED ORDER — ONDANSETRON 4 MG PO TBDP
ORAL_TABLET | ORAL | 0 refills | Status: AC
  Filled 2021-10-22: qty 12, 3d supply, fill #0

## 2021-10-22 NOTE — ED Triage Notes (Signed)
EMS stated, N/V abdominal pain started around 640 this morning.

## 2021-10-22 NOTE — ED Provider Notes (Signed)
[facility] EMERGENCY DEPARTMENT Provider Note   CSN: [id number] Arrival date & time: 10/22/21  3662     History  Chief Complaint  Patient presents with   Abdominal Pain   Nausea   Emesis    Michelle Gibson is a 46 y.o. female.  Patient complains of abdominal pain and right flank pain.  Patient has a history of diabetes and asthma.  She also has been vomiting  The history is provided by the patient and medical records. No language interpreter was used.  Abdominal Pain Pain location:  R flank Pain quality: aching   Pain radiates to:  Does not radiate Pain severity:  Moderate Onset quality:  Sudden Timing:  Constant Progression:  Worsening Chronicity:  New Context: not alcohol use   Relieved by:  Nothing Worsened by:  Nothing Associated symptoms: vomiting   Associated symptoms: no chest pain, no cough, no diarrhea, no fatigue and no hematuria   Emesis Associated symptoms: abdominal pain   Associated symptoms: no cough, no diarrhea and no headaches       Home Medications Prior to Admission medications   Medication Sig Start Date End Date Taking? Authorizing Provider  ondansetron (ZOFRAN-ODT) 4 MG disintegrating tablet 47m ODT q4 hours prn nausea/vomit 10/22/21  Yes ZMilton Ferguson MD  oxyCODONE-acetaminophen (PERCOCET) 5-325 MG tablet Take 1 tablet by mouth every 6 (six) hours as needed. 10/22/21  Yes ZMilton Ferguson MD  tamsulosin (FLOMAX) 0.4 MG CAPS capsule Take 1 capsule (0.4 mg total) by mouth daily. 10/22/21  Yes ZMilton Ferguson MD  atorvastatin (LIPITOR) 20 MG tablet Take 1 tablet (20 mg total) by mouth daily. 05/25/21   FGildardo Pounds NP  benzonatate (TESSALON) 100 MG capsule Take 1 capsule (100 mg total) by mouth 2 (two) times daily as needed for cough. 09/07/21   NFenton Foy NP  Blood Glucose Monitoring Suppl (ONETOUCH VERIO REFLECT) w/Device KIT USE TO CHECK BLOOD SUGAR 3 TIMES DAILY 12/10/20 12/10/21  NCharlott Rakes MD  Blood Glucose  Monitoring Suppl (ONETOUCH VERIO) w/Device KIT Use to check blood sugar TID. 12/10/20   NCharlott Rakes MD  Continuous Blood Gluc Receiver (DEXCOM G6 RECEIVER) DEVI 1 each by Does not apply route 4 (four) times daily -  before meals and at bedtime. 06/19/21   SCamillia Herter NP  Continuous Blood Gluc Sensor (DEXCOM G6 SENSOR) MISC 1 each by Does not apply route 4 (four) times daily -  before meals and at bedtime. 06/19/21   SCamillia Herter NP  Continuous Blood Gluc Transmit (DEXCOM G6 TRANSMITTER) MISC 1 each by Does not apply route 4 (four) times daily -  before meals and at bedtime. 06/19/21   SCamillia Herter NP  gabapentin (NEURONTIN) 300 MG capsule Take 2 capsules (600 mg total) by mouth 4 (four) times daily. 08/18/21 11/16/21  SCamillia Herter NP  glucose blood test strip USE TO CHECK BLOOD SUGAR 3 TIMES DAILY 05/25/21 05/25/22  FGildardo Pounds NP  hydrOXYzine (VISTARIL) 25 MG capsule Take 1 capsule (25 mg total) by mouth every 8 (eight) hours as needed. 06/08/21   FGildardo Pounds NP  insulin aspart (NOVOLOG) 100 UNIT/ML injection Inject 4 Units into the skin 3 (three) times daily with meals. 06/09/21   VGeradine Girt DO  insulin glargine (LANTUS) 100 UNIT/ML Solostar Pen Inject 20 Units into the skin daily. 08/18/21 11/16/21  SCamillia Herter NP  insulin lispro (HUMALOG) 100 UNIT/ML KwikPen Inject 4 units three times daily  with meals and sliding scale instructions three times daily. CBG 70 - 120: 0 units CBG 121 - 150: 2 units CBG 151 - 200: 3 units CBG 201 - 250: 5 units CBG 251 - 300: 8 units CBG 301 - 350: 11 units CBG 351 - 400: 15 units 08/18/21   Minette Brine, Amy J, NP  Insulin Pen Needle 31G X 5 MM MISC USE AS INSTRUCTED. CHECK BLOOD GLUCOSE LEVEL BY FINGERSTICK TWICE PER DAY. 08/18/21 08/18/22  Camillia Herter, NP  ipratropium-albuterol (DUONEB) 0.5-2.5 (3) MG/3ML SOLN Take 3 mLs by nebulization every 6 (six) hours as needed (wheezing).    [provider]  Lancets (ONETOUCH DELICA PLUS  YBOFBP10C) [place] USE TO CHECK BLOOD SUGAR 3 TIMES DAILY 12/10/20 12/10/21  Charlott Rakes, MD  metFORMIN (GLUCOPHAGE) 1000 MG tablet Take 0.5 tablets (500 mg total) by mouth 2 (two) times daily with a meal. 08/18/21 11/16/21  Camillia Herter, NP  nystatin cream (MYCOSTATIN) Apply topically 2 (two) times daily as needed (apply to rash). 06/09/21   Geradine Girt, DO  OneTouch Delica Lancets 58N MISC Use to check blood sugar TID. 12/10/20   Charlott Rakes, MD  oseltamivir (TAMIFLU) 75 MG capsule Take 1 capsule (75 mg total) by mouth every 12 (twelve) hours. 09/05/21   Francene Finders, PA-C  sertraline (ZOLOFT) 100 MG tablet TAKE 1 TABLET (100 MG TOTAL) BY MOUTH DAILY. 05/25/21 05/25/22  Gildardo Pounds, NP  SUMAtriptan (IMITREX) 25 MG tablet Take 25 mg (1 tablet) total by mouth at the start of the headache. May repeat in 2 hours x 1 if headache persists. Max of 2 tabs/24 hours 08/18/21   Camillia Herter, NP  topiramate (TOPAMAX) 50 MG tablet Take 1 tablet (50 mg total) by mouth daily as needed (migraine). 06/09/21 06/09/22  Geradine Girt, DO  metoCLOPramide (REGLAN) 10 MG tablet Take 1 tablet (10 mg total) by mouth every 8 (eight) hours as needed for nausea or vomiting (headache). Patient not taking: Reported on 07/14/2020 07/07/20 07/17/20  Petrucelli, Glynda Jaeger, PA-C  traZODone (DESYREL) 50 MG tablet Take 0.5-1 tablets (25-50 mg total) by mouth at bedtime as needed for sleep. 06/04/19 07/13/19  Fulp, Ander Gaster, MD      Allergies    Ciprofloxacin, Lyrica cr [pregabalin er], Doxycycline, Flecainide, Latex, and Penicillin g    Review of Systems   Review of Systems  Constitutional:  Negative for appetite change and fatigue.  HENT:  Negative for congestion, ear discharge and sinus pressure.   Eyes:  Negative for discharge.  Respiratory:  Negative for cough.   Cardiovascular:  Negative for chest pain.  Gastrointestinal:  Positive for abdominal pain and vomiting. Negative for diarrhea.  Genitourinary:  Negative for  frequency and hematuria.  Musculoskeletal:  Negative for back pain.  Skin:  Negative for rash.  Neurological:  Negative for seizures and headaches.  Psychiatric/Behavioral:  Negative for hallucinations.    Physical Exam Updated Vital Signs BP 132/82    Pulse 77    Temp 98 F (36.7 C)    Resp 15    SpO2 98%  Physical Exam Vitals and nursing note reviewed.  Constitutional:      Appearance: She is well-developed.  HENT:     Head: Normocephalic.     Nose: Nose normal.  Eyes:     General: No scleral icterus.    Conjunctiva/sclera: Conjunctivae normal.  Neck:     Thyroid: No thyromegaly.  Cardiovascular:     Rate and Rhythm:  Normal rate and regular rhythm.     Heart sounds: No murmur heard.   No friction rub. No gallop.  Pulmonary:     Breath sounds: No stridor. No wheezing or rales.  Chest:     Chest wall: No tenderness.  Abdominal:     General: There is no distension.     Tenderness: There is abdominal tenderness. There is no rebound.  Musculoskeletal:        General: Normal range of motion.     Cervical back: Neck supple.  Lymphadenopathy:     Cervical: No cervical adenopathy.  Skin:    Findings: No erythema or rash.  Neurological:     Mental Status: She is alert and oriented to person, place, and time.     Motor: No abnormal muscle tone.     Coordination: Coordination normal.  Psychiatric:        Behavior: Behavior normal.    ED Results / Procedures / Treatments   Labs (all labs ordered are listed, but only abnormal results are displayed) Labs Reviewed  COMPREHENSIVE METABOLIC PANEL - Abnormal; Notable for the following components:      Result Value   Glucose, Bld 221 (*)    All other components within normal limits  CBC - Abnormal; Notable for the following components:   WBC 10.9 (*)    Platelets 451 (*)    All other components within normal limits  URINALYSIS, ROUTINE W REFLEX MICROSCOPIC - Abnormal; Notable for the following components:   Hgb urine  dipstick LARGE (*)    Bilirubin Urine SMALL (*)    Protein, ur 100 (*)    Leukocytes,Ua TRACE (*)    All other components within normal limits  URINALYSIS, MICROSCOPIC (REFLEX) - Abnormal; Notable for the following components:   Bacteria, UA FEW (*)    All other components within normal limits  LIPASE, BLOOD  I-STAT BETA HCG BLOOD, ED ([facility], [facility], [facility] ONLY)    EKG None  Radiology CT Renal Stone Study  Result Date: 10/22/2021 CLINICAL DATA:  Bilateral flank pain, unable to urinate. History of renal stones. EXAM: CT ABDOMEN AND PELVIS WITHOUT CONTRAST TECHNIQUE: Multidetector CT imaging of the abdomen and pelvis was performed following the standard protocol without IV contrast. RADIATION DOSE REDUCTION: This exam was performed according to the departmental dose-optimization program which includes automated exposure control, adjustment of the mA and/or kV according to patient size and/or use of iterative reconstruction technique. COMPARISON:  October 06, 2020 FINDINGS: Lower chest: No acute abnormality. Hepatobiliary: No suspicious hepatic lesion. Gallbladder surgically absent. No biliary ductal dilation. Pancreas: No pancreatic ductal dilation or evidence of acute inflammation. Spleen: Normal in size without focal abnormality. Adrenals/Urinary Tract: Bilateral adrenal glands are unremarkable. Right hydroureteronephrosis to the level of a 2 mm stone at the ureterovesicular junction. Left kidney is unremarkable without hydronephrosis or nephrolithiasis. Urinary bladder is unremarkable for degree of distension. Stomach/Bowel: No enteric contrast was administered. Stomach is unremarkable for degree of distension. No pathologic dilation of small or large bowel. The appendix is surgically absent. Scattered left-sided colonic diverticulosis without findings of acute diverticulitis. Small volume of formed stool in the colon. Vascular/Lymphatic: No significant vascular findings are present. No pathologically  enlarged abdominal or pelvic lymph nodes. Reproductive: Status post hysterectomy. No adnexal masses. Other: No significant abdominopelvic free fluid. Musculoskeletal: Multilevel degenerative changes spine with multilevel Schmorl's node formation. Right gluteal battery pack with unchanged position of the sacral stimulator lead. No acute osseous abnormality. IMPRESSION: 1. Right hydroureteronephrosis to  the level of a 2 mm stone at the right ureterovesicular junction. 2. Colonic diverticulosis without findings of acute diverticulitis. Electronically Signed   By: Dahlia Bailiff M.D.   On: 10/22/2021 12:13    Procedures Procedures    Medications Ordered in ED Medications  ondansetron (ZOFRAN-ODT) disintegrating tablet 4 mg (4 mg Oral Given 10/22/21 0752)  sodium chloride 0.9 % bolus 2,000 mL (0 mLs Intravenous Stopped 10/22/21 1327)  HYDROmorphone (DILAUDID) injection 0.5 mg (0.5 mg Intravenous Given 10/22/21 1048)  ondansetron (ZOFRAN) injection 4 mg (4 mg Intravenous Given 10/22/21 1047)  ketorolac (TORADOL) 30 MG/ML injection 30 mg (30 mg Intravenous Given 10/22/21 1317)  HYDROmorphone (DILAUDID) injection 0.5 mg (0.5 mg Intravenous Given 10/22/21 1319)    ED Course/ Medical Decision Making/ A&P                           Medical Decision Making  Patient with kidney stone.  She is discharged home to follow-up with urology given Percocet Flomax and Zofran   This patient presents to the ED for concern of abdominal pain and flank pain, this involves an extensive number of treatment options, and is a complaint that carries with it a high risk of complications and morbidity.  The differential diagnosis includes kidney stone appendicitis gallbladder disease   Co morbidities that complicate the patient evaluation  Diabetes asthma  Additional history obtained:  Additional history obtained from patient External records from outside source obtained and reviewed including old record   Lab  Tests:  I Ordered, and personally interpreted labs.  The pertinent results include: White count elevated 10.9 and glucose 220   Imaging Studies ordered:  I ordered imaging studies including CT abdomen I independently visualized and interpreted imaging which showed 2 mm ureteral stone I agree with the radiologist interpretation   Cardiac Monitoring:  The patient was maintained on a cardiac monitor.  I personally viewed and interpreted the cardiac monitored which showed an underlying rhythm of: Normal sinus rhythm   Medicines ordered and prescription drug management:  I ordered medication including Toradol and Dilaudid for pain and Zofran for nausea Reevaluation of the patient after these medicines showed that the patient improved I have reviewed the patients home medicines and have made adjustments as needed   Test Considered:  MRI   Critical Interventions:  Fluids pain medicine nausea medicine   Consultations Obtained:  No consult  Problem List / ED Course:  Kidney stone/diabetes   Reevaluation:  After the interventions noted above, I reevaluated the patient and found that they have :improved   Social Determinants of Health:  None    Dispostion:  After consideration of the diagnostic results and the patients response to treatment, I feel that the patent would benefit from discharge home with pain medicine nausea medicine and Flomax        Final Clinical Impression(s) / ED Diagnoses Final diagnoses:  Kidney stone  Patient with ureteral stone on the right side.  She is discharged home with Percocet Flomax and Zofran will follow-up urology  Rx / DC Orders ED Discharge Orders          Ordered    oxyCODONE-acetaminophen (PERCOCET) 5-325 MG tablet  Every 6 hours PRN        10/22/21 1404    tamsulosin (FLOMAX) 0.4 MG CAPS capsule  Daily        10/22/21 1404    ondansetron (ZOFRAN-ODT) 4 MG disintegrating tablet  10/22/21 1404               Milton Ferguson, MD 10/24/21 1024

## 2021-10-22 NOTE — ED Notes (Signed)
RN bladder scanned patient. 23mL seen. MD notified. MD suggested to in and out cath her.

## 2021-10-22 NOTE — ED Notes (Signed)
Pt ambulatory to bathroom

## 2021-10-22 NOTE — ED Notes (Signed)
RN wasted 0.5 of dilaudid with Eloisa Northern, RN. This RN could not find pt in pyxis due to her being discharged. Wasted drug in green zone pyxis.

## 2021-10-22 NOTE — Discharge Instructions (Signed)
Follow-up with [facility] in a week.  Drink plenty of fluids.

## 2021-10-22 NOTE — ED Provider Notes (Signed)
Patient's Percocet has now been sent to [facility] [place], Michelle John, MD 10/22/21 1443

## 2021-10-29 ENCOUNTER — Other Ambulatory Visit: Payer: Self-pay

## 2021-11-12 ENCOUNTER — Other Ambulatory Visit: Payer: Self-pay

## 2021-12-09 ENCOUNTER — Other Ambulatory Visit: Payer: Self-pay

## 2021-12-16 ENCOUNTER — Other Ambulatory Visit: Payer: Self-pay

## 2021-12-18 ENCOUNTER — Other Ambulatory Visit: Payer: Self-pay

## 2022-01-08 ENCOUNTER — Other Ambulatory Visit: Payer: Self-pay | Admitting: Family

## 2022-01-08 ENCOUNTER — Other Ambulatory Visit: Payer: Self-pay

## 2022-01-08 DIAGNOSIS — Z794 Long term (current) use of insulin: Secondary | ICD-10-CM

## 2022-01-08 DIAGNOSIS — E1141 Type 2 diabetes mellitus with diabetic mononeuropathy: Secondary | ICD-10-CM

## 2022-01-08 MED ORDER — GABAPENTIN 300 MG PO CAPS
600.0000 mg | ORAL_CAPSULE | Freq: Four times a day (QID) | ORAL | 3 refills | Status: DC
  Filled 2022-01-08: qty 180, 23d supply, fill #0
  Filled 2022-02-10: qty 180, 23d supply, fill #1
  Filled 2022-03-08: qty 180, 23d supply, fill #2
  Filled 2022-03-30: qty 180, 23d supply, fill #3

## 2022-02-10 ENCOUNTER — Other Ambulatory Visit: Payer: Self-pay

## 2022-02-11 ENCOUNTER — Other Ambulatory Visit: Payer: Self-pay

## 2022-03-09 ENCOUNTER — Other Ambulatory Visit: Payer: Self-pay

## 2022-03-12 ENCOUNTER — Other Ambulatory Visit: Payer: Self-pay

## 2022-03-30 ENCOUNTER — Other Ambulatory Visit: Payer: Self-pay

## 2022-04-05 ENCOUNTER — Other Ambulatory Visit: Payer: Self-pay

## 2022-04-21 ENCOUNTER — Other Ambulatory Visit: Payer: Self-pay | Admitting: Emergency Medicine

## 2022-04-21 ENCOUNTER — Other Ambulatory Visit: Payer: Self-pay | Admitting: Family

## 2022-04-21 ENCOUNTER — Other Ambulatory Visit: Payer: Self-pay

## 2022-04-21 DIAGNOSIS — E1141 Type 2 diabetes mellitus with diabetic mononeuropathy: Secondary | ICD-10-CM

## 2022-04-21 DIAGNOSIS — Z794 Long term (current) use of insulin: Secondary | ICD-10-CM

## 2022-04-21 MED ORDER — GABAPENTIN 300 MG PO CAPS
600.0000 mg | ORAL_CAPSULE | Freq: Four times a day (QID) | ORAL | 3 refills | Status: DC
  Filled 2022-04-21: qty 180, 23d supply, fill #0
  Filled 2022-06-03: qty 180, 23d supply, fill #1
  Filled 2022-06-22: qty 180, 23d supply, fill #2
  Filled 2022-08-03: qty 180, 23d supply, fill #3

## 2022-04-21 NOTE — Telephone Encounter (Signed)
Requested Prescriptions  Pending Prescriptions Disp Refills  . gabapentin (NEURONTIN) 300 MG capsule 180 capsule 3    Sig: Take 2 capsules (600 mg total) by mouth 4 (four) times daily.     Neurology: Anticonvulsants - gabapentin Passed - 04/21/2022 12:04 PM      Passed - Cr in normal range and within 360 days    Creat  Date Value Ref Range Status  10/07/2016 1.00 0.50 - 1.10 mg/dL Final   Creatinine, Ser  Date Value Ref Range Status  10/22/2021 0.93 0.44 - 1.00 mg/dL Final         Passed - Completed PHQ-2 or PHQ-9 in the last 360 days      Passed - Valid encounter within last 12 months    Recent Outpatient Visits          7 months ago COVID-19   Primary Care at [facility], Kriste Basque, NP   8 months ago Type 2 diabetes mellitus with diabetic mononeuropathy, with long-term current use of insulin [facility])   Primary Care at [facility], Amy J, NP   10 months ago Encounter to establish care   Primary Care at [facility], Amy J, NP   11 months ago Type 2 diabetes mellitus with other specified complication, with long-term current use of insulin [facility])   [place], [place] W, NP   1 year ago Type 2 diabetes mellitus without complication, without long-term current use of insulin [facility])   [place], Vernia Buff, NP

## 2022-04-22 ENCOUNTER — Other Ambulatory Visit: Payer: Self-pay

## 2022-04-28 ENCOUNTER — Other Ambulatory Visit: Payer: Self-pay

## 2022-04-30 ENCOUNTER — Encounter: Payer: Self-pay | Admitting: Family

## 2022-05-12 ENCOUNTER — Ambulatory Visit: Payer: Self-pay

## 2022-05-12 NOTE — Progress Notes (Signed)
Erroneous encounter-disregard

## 2022-05-12 NOTE — Telephone Encounter (Signed)
  Chief Complaint: leg pain Symptoms: bilat leg pain and tingling that radiates from toes to knees Frequency: ongoing for weeks but getting worse Pertinent Negatives: NA Disposition: '[]'$ ED /'[]'$ Urgent Care (no appt availability in office) / '[x]'$ Appointment(In office/virtual)/ '[]'$  [place] [facility]/ '[]'$ Home Care/ '[]'$ Refused Recommended Disposition /'[]'$  Mobile Bus/ '[]'$  Follow-up with PCP Additional Notes: pt states she hasn't been seen for this before but is tried everything OTC and nothing is helping. This pain is also affecting sleeping. Cold packs make pain work but heating pad not providing a lot of relief as well as Ibuprofen. Advised pt she could go to [facility] but attempted to schedule an appt but wouldn't allow so pt preferred to schedule appt with Amy, NP on 05/17/22 at [place]. Advised pt if pain gets worse can go to [facility] without appt or can go to [facility] as well. Pt verbalized understanding.    Reason for Disposition  [1] MODERATE pain (e.g., interferes with normal activities, limping) AND [2] present > 3 days  Answer Assessment - Initial Assessment Questions 1. ONSET: "When did the pain start?"      Over past few weeks  2. LOCATION: "Where is the pain located?"      Both legs, tingling from feet to knee caps  3. PAIN: "How bad is the pain?"    (Scale 1-10; or mild, moderate, severe)   -  MILD (1-3): doesn't interfere with normal activities    -  MODERATE (4-7): interferes with normal activities (e.g., work or school) or awakens from sleep, limping    -  SEVERE (8-10): excruciating pain, unable to do any normal activities, unable to walk     7 6. OTHER SYMPTOMS: "Do you have any other symptoms?" (e.g., chest pain, back pain, breathing difficulty, swelling, rash, fever, numbness, weakness)     Difficulty sleeping  Protocols used: Leg Pain-A-AH

## 2022-05-18 ENCOUNTER — Encounter: Payer: Self-pay | Admitting: Family

## 2022-06-03 ENCOUNTER — Other Ambulatory Visit: Payer: Self-pay

## 2022-06-04 ENCOUNTER — Other Ambulatory Visit: Payer: Self-pay

## 2022-06-22 ENCOUNTER — Other Ambulatory Visit: Payer: Self-pay

## 2022-06-23 ENCOUNTER — Other Ambulatory Visit: Payer: Self-pay

## 2022-06-24 ENCOUNTER — Other Ambulatory Visit: Payer: Self-pay

## 2022-06-29 ENCOUNTER — Other Ambulatory Visit: Payer: Self-pay

## 2022-06-29 ENCOUNTER — Telehealth: Payer: Self-pay | Admitting: Physician Assistant

## 2022-06-29 DIAGNOSIS — R3989 Other symptoms and signs involving the genitourinary system: Secondary | ICD-10-CM

## 2022-06-29 MED ORDER — FLUCONAZOLE 150 MG PO TABS
ORAL_TABLET | ORAL | 0 refills | Status: DC
  Filled 2022-06-29: qty 2, 3d supply, fill #0

## 2022-06-29 MED ORDER — NITROFURANTOIN MONOHYD MACRO 100 MG PO CAPS
100.0000 mg | ORAL_CAPSULE | Freq: Two times a day (BID) | ORAL | 0 refills | Status: DC
  Filled 2022-06-29: qty 10, 5d supply, fill #0

## 2022-06-29 NOTE — Progress Notes (Signed)
E-Visit for Urinary Problems  We are sorry that you are not feeling well.  Here is how we plan to help!  Based on what you shared with me it looks like you most likely have a simple urinary tract infection.  A UTI (Urinary Tract Infection) is a bacterial infection of the bladder.  Most cases of urinary tract infections are simple to treat but a key part of your care is to encourage you to drink plenty of fluids and watch your symptoms carefully.  I have prescribed MacroBid 100 mg twice a day for 5 days.  Your symptoms should gradually improve. Call us if the burning in your urine worsens, you develop worsening fever, back pain or pelvic pain or if your symptoms do not resolve after completing the antibiotic.  This antibiotic is much less likely to cause a yeast infection but I will send Diflucan to the pharmacy to have if needed.   Urinary tract infections can be prevented by drinking plenty of water to keep your body hydrated.  Also be sure when you wipe, wipe from front to back and don't hold it in!  If possible, empty your bladder every 4 hours.  HOME CARE Drink plenty of fluids Compete the full course of the antibiotics even if the symptoms resolve Remember, when you need to go.go. Holding in your urine can increase the likelihood of getting a UTI! GET HELP RIGHT AWAY IF: You cannot urinate You get a high fever Worsening back pain occurs You see blood in your urine You feel sick to your stomach or throw up You feel like you are going to pass out  MAKE SURE YOU  Understand these instructions. Will watch your condition. Will get help right away if you are not doing well or get worse.   Thank you for choosing an e-visit.  Your e-visit answers were reviewed by a board certified advanced clinical practitioner to complete your personal care plan. Depending upon the condition, your plan could have included both over the counter or prescription medications.  Please review your  pharmacy choice. Make sure the pharmacy is open so you can pick up prescription now. If there is a problem, you may contact your provider through [employer] and have the prescription routed to another pharmacy.  Your safety is important to [place]. If you have drug allergies check your prescription carefully.   For the next 24 hours you can use MyChart to ask questions about today's visit, request a non-urgent call back, or ask for a work or school excuse. You will get an email in the next two days asking about your experience. I hope that your e-visit has been valuable and will speed your recovery.

## 2022-06-29 NOTE — Progress Notes (Signed)
I have spent 5 minutes in review of e-visit questionnaire, review and updating patient chart, medical decision making and response to patient.   Ijeoma Loor Cody Taylan Marez, PA-C    

## 2022-07-01 ENCOUNTER — Encounter (HOSPITAL_COMMUNITY): Payer: Self-pay

## 2022-07-01 ENCOUNTER — Ambulatory Visit: Payer: Self-pay

## 2022-07-01 ENCOUNTER — Other Ambulatory Visit: Payer: Self-pay

## 2022-07-01 ENCOUNTER — Emergency Department (HOSPITAL_COMMUNITY): Payer: Commercial Managed Care - PPO

## 2022-07-01 ENCOUNTER — Emergency Department (HOSPITAL_COMMUNITY)
Admission: EM | Admit: 2022-07-01 | Discharge: 2022-07-01 | Discharge status: Against Medical Advice | Payer: Commercial Managed Care - PPO | Attending: Emergency Medicine | Admitting: Emergency Medicine

## 2022-07-01 DIAGNOSIS — R202 Paresthesia of skin: Secondary | ICD-10-CM | POA: Insufficient documentation

## 2022-07-01 DIAGNOSIS — Z5321 Procedure and treatment not carried out due to patient leaving prior to being seen by health care provider: Secondary | ICD-10-CM | POA: Insufficient documentation

## 2022-07-01 DIAGNOSIS — R079 Chest pain, unspecified: Secondary | ICD-10-CM | POA: Insufficient documentation

## 2022-07-01 DIAGNOSIS — R42 Dizziness and giddiness: Secondary | ICD-10-CM | POA: Insufficient documentation

## 2022-07-01 DIAGNOSIS — R0602 Shortness of breath: Secondary | ICD-10-CM | POA: Insufficient documentation

## 2022-07-01 LAB — CBC
HCT: 40.2 % (ref 36.0–46.0)
Hemoglobin: 14 g/dL (ref 12.0–15.0)
MCH: 30.8 pg (ref 26.0–34.0)
MCHC: 34.8 g/dL (ref 30.0–36.0)
MCV: 88.4 fL (ref 80.0–100.0)
Platelets: 350 10*3/uL (ref 150–400)
RBC: 4.55 MIL/uL (ref 3.87–5.11)
RDW: 12.5 % (ref 11.5–15.5)
WBC: 6.9 10*3/uL (ref 4.0–10.5)
nRBC: 0 % (ref 0.0–0.2)

## 2022-07-01 LAB — I-STAT BETA HCG BLOOD, ED (MC, WL, AP ONLY): I-stat hCG, quantitative: <5 m[IU]/mL (ref <5)

## 2022-07-01 LAB — BASIC METABOLIC PANEL
Anion gap: 12 (ref 5–15)
BUN: 14 mg/dL (ref 6–20)
CO2: 25 mmol/L (ref 22–32)
Calcium: 9.4 mg/dL (ref 8.9–10.3)
Chloride: 100 mmol/L (ref 98–111)
Creatinine, Ser: 0.85 mg/dL (ref 0.44–1.00)
GFR, Estimated: >60 mL/min (ref >60)
Glucose, Bld: 308 mg/dL — ABNORMAL HIGH (ref 70–99)
Potassium: 4.1 mmol/L (ref 3.5–5.1)
Sodium: 137 mmol/L (ref 135–145)

## 2022-07-01 LAB — TROPONIN I (HIGH SENSITIVITY)
Troponin I (High Sensitivity): 3 ng/L (ref <18)
Troponin I (High Sensitivity): 5 ng/L (ref <18)

## 2022-07-01 NOTE — ED Notes (Signed)
Pt had to leave because the sitter for her autistic daughter had to leave and no one is home to watch her. Was not able to wait any longer

## 2022-07-01 NOTE — Telephone Encounter (Signed)
  Chief Complaint: chest pressure and "heaviness" and pain  Symptoms: elevated HR to 150, radiating pain down left arm(mild), SOB Frequency: "issue for past week" Pertinent Negatives: Patient denies did not ask due to emergency Disposition: '[x]'$ ED /'[]'$ Urgent Care (no appt availability in office) / '[]'$ Appointment(In office/virtual)/ '[]'$  [place] [facility]/ '[]'$ Home Care/ '[]'$ Refused Recommended Disposition /'[]'$ [place] [facility]/ '[]'$  Follow-up with PCP Additional Notes: Assisted pt by calling 911-  Reason for Disposition  [1] Chest pain lasts > 5 minutes AND [2] age > 72  Answer Assessment - Initial Assessment Questions 1. LOCATION: "Where does it hurt?"       Chest  2. RADIATION: "Does the pain go anywhere else?" (e.g., into neck, jaw, arms, back)     Down left arm 3. ONSET: "When did the chest pain begin?" (Minutes, hours or days)      Last week issues with heart 4. PATTERN: "Does the pain come and go, or has it been constant since it started?"  "Does it get worse with exertion?"      Did not ask d/t emergent sx 5. DURATION: "How long does it last" (e.g., seconds, minutes, hours)         Did not ask d/t emergent sx 6. SEVERITY: "How bad is the pain?"  (e.g., Scale 1-10; mild, moderate, or severe)    - MILD (1-3): doesn't interfere with normal activities     - MODERATE (4-7): interferes with normal activities or awakens from sleep    - SEVERE (8-10): excruciating pain, unable to do any normal activities       severe 7. CARDIAC RISK FACTORS: "Do you have any history of heart problems or risk factors for heart disease?" (e.g., angina, prior heart attack; diabetes, high blood pressure, high cholesterol, smoker, or strong family history of heart disease)     [employer], palpitations 8. PULMONARY RISK FACTORS: "Do you have any history of lung disease?"  (e.g., blood clots in lung, asthma, emphysema, birth control pills)     asthma 9. CAUSE: "What do you think is causing the chest  pain?"         Did not ask d/t emergent sx 10. OTHER SYMPTOMS: "Do you have any other symptoms?" (e.g., dizziness, nausea, vomiting, sweating, fever, difficulty breathing, cough)       SOB 11. PREGNANCY: "Is there any chance you are pregnant?" "When was your last menstrual period?"       N/a  Protocols used: Chest Pain-A-AH

## 2022-07-01 NOTE — ED Provider Triage Note (Signed)
Emergency Medicine Provider Triage Evaluation Note  Konni Marisabel Macpherson , a 46 y.o. female  was evaluated in triage.  Pt complains of chest pain onset PTA. She notes that it was sudden onset. History of WPW with an ablation. Notes that her pain is radiating to her left shoulder. Feels dizzy as well. Paresthesia to her left finger tip. No nausea or vomiting.  No prior history of MI, stents, hypertension. No meds given by EMS PTA.  Has a history of diabetes, CBG with EMS was 280.  Review of Systems  Positive:  Negative:   Physical Exam  BP 111/76 (BP Location: Right Arm)   Pulse 96   Temp 99.3 F (37.4 C) (Oral)   Resp 18   SpO2 96%  Gen:   Awake, no distress   Resp:  Normal effort  MSK:   Moves extremities without difficulty  Other:    Medical Decision Making  Medically screening exam initiated at 3:11 PM.  Appropriate orders placed.  Adean Dawn Waldrop was informed that the remainder of the evaluation will be completed by another provider, this initial triage assessment does not replace that evaluation, and the importance of remaining in the ED until their evaluation is complete.  Work-up initiated.   Jerrilyn Messinger A, PA-C 07/01/22 1542

## 2022-07-01 NOTE — ED Triage Notes (Signed)
CP 18mns prior to calling 911 hx of WPW with SOB and dizzy.  Possibly need a pacemaker and radiaating to left shoulder.  Described as pressure. Denies n.v

## 2022-07-06 ENCOUNTER — Other Ambulatory Visit: Payer: Self-pay

## 2022-08-03 ENCOUNTER — Other Ambulatory Visit (HOSPITAL_COMMUNITY): Payer: Self-pay

## 2022-08-04 ENCOUNTER — Other Ambulatory Visit (HOSPITAL_COMMUNITY): Payer: Self-pay

## 2022-08-15 ENCOUNTER — Ambulatory Visit
Admission: EM | Admit: 2022-08-15 | Discharge: 2022-08-15 | Disposition: A | Discharge status: Home / Self Care | Payer: Self-pay | Attending: Internal Medicine | Admitting: Internal Medicine

## 2022-08-15 ENCOUNTER — Encounter: Payer: Self-pay | Admitting: Emergency Medicine

## 2022-08-15 DIAGNOSIS — Z1152 Encounter for screening for COVID-19: Secondary | ICD-10-CM | POA: Insufficient documentation

## 2022-08-15 DIAGNOSIS — J069 Acute upper respiratory infection, unspecified: Secondary | ICD-10-CM | POA: Insufficient documentation

## 2022-08-15 DIAGNOSIS — R059 Cough, unspecified: Secondary | ICD-10-CM | POA: Insufficient documentation

## 2022-08-15 DIAGNOSIS — Z79899 Other long term (current) drug therapy: Secondary | ICD-10-CM | POA: Insufficient documentation

## 2022-08-15 LAB — RESP PANEL BY RT-PCR (FLU A&B, COVID) ARPGX2
Influenza A by PCR: NEGATIVE
Influenza B by PCR: NEGATIVE
SARS Coronavirus 2 by RT PCR: NEGATIVE

## 2022-08-15 MED ORDER — BENZONATATE 100 MG PO CAPS: 100.0000 mg | ORAL_CAPSULE | Freq: Three times a day (TID) | ORAL | 0 refills | Status: DC | PRN

## 2022-08-15 MED ORDER — FLUTICASONE PROPIONATE 50 MCG/ACT NA SUSP: 1.0000 | Freq: Every day | NASAL | 0 refills | Status: AC

## 2022-08-15 NOTE — ED Provider Notes (Signed)
[name]    CSN: [id number] Arrival date & time: 08/15/22  1322      History   Chief Complaint Chief Complaint  Patient presents with   Sore Throat    Congestion    HPI Michelle Gibson is a 46 y.o. female.   Patient presents with nasal congestion, sore throat, nonproductive cough that has been present for about 3 days.  She denies any known sick contacts or fever.  Denies chest pain, shortness of breath, nausea, vomiting, diarrhea, abdominal pain.  Denies history of asthma or COPD.  Patient does not report taking medications to help alleviate symptoms.   Sore Throat    Past Medical History:  Diagnosis Date   Acute tear lateral meniscus, right, initial encounter 08/29/2019   Asthma    Closed fracture of upper end of fibula 09/21/2018   Diabetes mellitus without complication ([place])    TYPE II   Hyperglycemia 09/15/2016   Migraines    WPW (Wolff-Parkinson-White syndrome)    s/p ablation at age 57    Patient Active Problem List   Diagnosis Date Noted   Migraine 08/18/2021   Diabetes mellitus with hyperglycemia ([place]) 06/08/2021   Uncontrolled type 2 diabetes mellitus with hyperglycemia ([place]) 06/08/2021   Nausea 06/08/2021   Hyperlipidemia 06/08/2021   History of COVID-19 03/19/2021   Tachycardia 03/19/2021   Palpitations 03/19/2021   Cough 03/19/2021   Complete tear of right ACL, initial encounter 12/10/2020   Acute tear lateral meniscus, right, initial encounter 08/29/2019   Acute medial meniscus tear of right knee 08/22/2019   Fever and chills 11/17/2018   Closed fracture of upper end of fibula 09/21/2018   WPW (Wolff-Parkinson-White syndrome) 10/10/2017   Asthma with acute exacerbation 09/15/2016   Hyperglycemia 09/15/2016   History of Wolff-Parkinson-White (WPW) syndrome 09/15/2016   Acute respiratory failure with hypoxia ([place]) 09/15/2016   Bronchospasm 09/11/2016    Past Surgical History:  Procedure Laterality Date   ABDOMINAL  HYSTERECTOMY     CARDIAC SURGERY     CESAREAN SECTION     KNEE ARTHROSCOPY WITH LATERAL MENISECTOMY Right 08/29/2019   Procedure: PARTIAL LATERAL MENISECTOMY;  Surgeon: Leandrew Koyanagi, MD;  Location: [facility];  Service: Orthopedics;  Laterality: Right;   KNEE ARTHROSCOPY WITH MEDIAL MENISECTOMY Right 08/29/2019   Procedure: RIGHT KNEE ARTHROSCOPY WITH PARTIAL MEDIAL MENISECTOMY;  Surgeon: Leandrew Koyanagi, MD;  Location: [facility];  Service: Orthopedics;  Laterality: Right;    OB History   No obstetric history on file.      Home Medications    Prior to Admission medications   Medication Sig Start Date End Date Taking? Authorizing Provider  benzonatate (TESSALON) 100 MG capsule Take 1 capsule (100 mg total) by mouth every 8 (eight) hours as needed for cough. 08/15/22  Yes Cherelle Midkiff, Hildred Alamin E, FNP  fluticasone (FLONASE) 50 MCG/ACT nasal spray Place 1 spray into both nostrils daily. 08/15/22  Yes Audreana Hancox, Hildred Alamin E, FNP  atorvastatin (LIPITOR) 20 MG tablet Take 1 tablet (20 mg total) by mouth daily. 05/25/21   Gildardo Pounds, NP  Blood Glucose Monitoring Suppl (ONETOUCH VERIO) w/Device KIT Use to check blood sugar TID. 12/10/20   Charlott Rakes, MD  Continuous Blood Gluc Receiver (DEXCOM G6 RECEIVER) DEVI 1 each by Does not apply route 4 (four) times daily -  before meals and at bedtime. 06/19/21   Camillia Herter, NP  Continuous Blood Gluc Sensor (DEXCOM G6 SENSOR) MISC 1 each by Does  not apply route 4 (four) times daily -  before meals and at bedtime. 06/19/21   Camillia Herter, NP  Continuous Blood Gluc Transmit (DEXCOM G6 TRANSMITTER) MISC 1 each by Does not apply route 4 (four) times daily -  before meals and at bedtime. 06/19/21   Camillia Herter, NP  fluconazole (DIFLUCAN) 150 MG tablet Take 1 tablet by mouth once. Repeat in 3 days if needed. 06/29/22   Brunetta Jeans, PA-C  gabapentin (NEURONTIN) 300 MG capsule Take 2 capsules (600 mg total) by mouth 4 (four) times  daily. 04/21/22 08/27/22  Camillia Herter, NP  hydrOXYzine (VISTARIL) 25 MG capsule Take 1 capsule (25 mg total) by mouth every 8 (eight) hours as needed. 06/08/21   Gildardo Pounds, NP  insulin aspart (NOVOLOG) 100 UNIT/ML injection Inject 4 Units into the skin 3 (three) times daily with meals. 06/09/21   Geradine Girt, DO  insulin glargine (LANTUS) 100 UNIT/ML Solostar Pen Inject 20 Units into the skin daily. 08/18/21 07/24/22  Camillia Herter, NP  insulin lispro (HUMALOG) 100 UNIT/ML KwikPen Inject 4 units three times daily with meals and sliding scale instructions three times daily. CBG 70 - 120: 0 units CBG 121 - 150: 2 units CBG 151 - 200: 3 units CBG 201 - 250: 5 units CBG 251 - 300: 8 units CBG 301 - 350: 11 units CBG 351 - 400: 15 units 08/18/21   Minette Brine, Amy J, NP  Insulin Pen Needle 31G X 5 MM MISC USE AS INSTRUCTED. CHECK BLOOD GLUCOSE LEVEL BY FINGERSTICK TWICE PER DAY. 08/18/21 08/18/22  Camillia Herter, NP  ipratropium-albuterol (DUONEB) 0.5-2.5 (3) MG/3ML SOLN Take 3 mLs by nebulization every 6 (six) hours as needed (wheezing).    [provider]  metFORMIN (GLUCOPHAGE) 1000 MG tablet Take 0.5 tablets (500 mg total) by mouth 2 (two) times daily with a meal. 08/18/21 11/16/21  Camillia Herter, NP  nitrofurantoin, macrocrystal-monohydrate, (MACROBID) 100 MG capsule Take 1 capsule (100 mg total) by mouth 2 (two) times daily. 06/29/22   Brunetta Jeans, PA-C  nystatin cream (MYCOSTATIN) Apply topically 2 (two) times daily as needed (apply to rash). 06/09/21   Geradine Girt, DO  ondansetron (ZOFRAN-ODT) 4 MG disintegrating tablet Place 1 tab under the tongue every 4 hours as needed for nausea and vomitting 10/22/21   Milton Ferguson, MD  OneTouch Delica Lancets 59D MISC Use to check blood sugar TID. 12/10/20   Charlott Rakes, MD  oseltamivir (TAMIFLU) 75 MG capsule Take 1 capsule (75 mg total) by mouth every 12 (twelve) hours. 09/05/21   Francene Finders, PA-C  oxyCODONE-acetaminophen  (PERCOCET) 5-325 MG tablet Take 1 tablet by mouth every 6 (six) hours as needed. 10/22/21   Milton Ferguson, MD  sertraline (ZOLOFT) 100 MG tablet TAKE 1 TABLET (100 MG TOTAL) BY MOUTH DAILY. 05/25/21 05/28/22  Gildardo Pounds, NP  SUMAtriptan (IMITREX) 25 MG tablet Take 25 mg (1 tablet) total by mouth at the start of the headache. May repeat in 2 hours x 1 if headache persists. Max of 2 tabs/24 hours 08/18/21   Camillia Herter, NP  tamsulosin (FLOMAX) 0.4 MG CAPS capsule Take 1 capsule (0.4 mg total) by mouth daily. 10/22/21   Milton Ferguson, MD  topiramate (TOPAMAX) 50 MG tablet Take 1 tablet (50 mg total) by mouth daily as needed (migraine). 06/09/21 06/09/22  Geradine Girt, DO  metoCLOPramide (REGLAN) 10 MG tablet Take 1 tablet (10 mg total) by  mouth every 8 (eight) hours as needed for nausea or vomiting (headache). Patient not taking: Reported on 07/14/2020 07/07/20 07/17/20  Petrucelli, Glynda Jaeger, PA-C  traZODone (DESYREL) 50 MG tablet Take 0.5-1 tablets (25-50 mg total) by mouth at bedtime as needed for sleep. 06/04/19 07/13/19  Fulp, Ander Gaster, MD    Family History Family History  Problem Relation Age of Onset   Hypertension Mother    CAD Mother 43   Diabetes Mother    Hypertension Father    CAD Father 47       CABG    Social History Social History   Tobacco Use   Smoking status: Some Days    Packs/day: 0.10    Types: Cigarettes   Smokeless tobacco: Never  Vaping Use   Vaping Use: Never used  Substance Use Topics   Alcohol use: No   Drug use: No     Allergies   Ciprofloxacin, Lyrica cr [pregabalin er], Doxycycline, Flecainide, Latex, and Penicillin g   Review of Systems Review of Systems Per HPI  Physical Exam Triage Vital Signs ED Triage Vitals  Enc Vitals Group     BP 08/15/22 1411 129/81     Pulse Rate 08/15/22 1411 93     Resp 08/15/22 1411 18     Temp 08/15/22 1411 98.3 F (36.8 C)     Temp src --      SpO2 08/15/22 1411 94 %     Weight --      Height --       Head Circumference --      Peak Flow --      Pain Score 08/15/22 1410 7     Pain Loc --      Pain Edu? --      Excl. in [place]? --    No data found.  Updated Vital Signs BP 129/81   Pulse 93   Temp 98.3 F (36.8 C)   Resp 18   SpO2 94%   Visual Acuity Right Eye Distance:   Left Eye Distance:   Bilateral Distance:    Right Eye Near:   Left Eye Near:    Bilateral Near:     Physical Exam Constitutional:      General: She is not in acute distress.    Appearance: Normal appearance. She is not toxic-appearing or diaphoretic.  HENT:     Head: Normocephalic and atraumatic.     Right Ear: Tympanic membrane and ear canal normal.     Left Ear: Tympanic membrane and ear canal normal.     Nose: Congestion present.     Mouth/Throat:     Mouth: Mucous membranes are moist.     Pharynx: Posterior oropharyngeal erythema present.  Eyes:     Extraocular Movements: Extraocular movements intact.     Conjunctiva/sclera: Conjunctivae normal.     Pupils: Pupils are equal, round, and reactive to light.  Cardiovascular:     Rate and Rhythm: Normal rate and regular rhythm.     Pulses: Normal pulses.     Heart sounds: Normal heart sounds.  Pulmonary:     Effort: Pulmonary effort is normal. No respiratory distress.     Breath sounds: Normal breath sounds. No stridor. No wheezing, rhonchi or rales.  Abdominal:     General: Abdomen is flat. Bowel sounds are normal.     Palpations: Abdomen is soft.  Musculoskeletal:        General: Normal range of motion.     Cervical back:  Normal range of motion.  Skin:    General: Skin is warm and dry.  Neurological:     General: No focal deficit present.     Mental Status: She is alert and oriented to person, place, and time. Mental status is at baseline.  Psychiatric:        Mood and Affect: Mood normal.        Behavior: Behavior normal.      UC Treatments / Results  Labs (all labs ordered are listed, but only abnormal results are  displayed) Labs Reviewed  RESP PANEL BY RT-PCR (FLU A&B, COVID) [id number]    EKG   Radiology No results found.  Procedures Procedures (including critical care time)  Medications Ordered in UC Medications - No data to display  Initial Impression / Assessment and Plan / UC Course  I have reviewed the triage vital signs and the nursing notes.  Pertinent labs & imaging results that were available during my care of the patient were reviewed by me and considered in my medical decision making (see chart for details).     Patient presents with symptoms likely from a viral upper respiratory infection. Differential includes bacterial pneumonia, sinusitis, allergic rhinitis, covid 19, flu, RSV. Do not suspect underlying cardiopulmonary process. Symptoms seem unlikely related to ACS, CHF or COPD exacerbations, pneumonia, pneumothorax. Patient is nontoxic appearing and not in need of emergent medical intervention.  Covid test Pending.  Do not think strep testing is necessary given appearance of posterior pharynx on exam.  Recommended symptom control with over the counter medications.  Patient sent prescriptions.  Return if symptoms fail to improve in 1-2 weeks or you develop shortness of breath, chest pain, severe headache. Patient states understanding and is agreeable.  Discharged with PCP followup.  Final Clinical Impressions(s) / UC Diagnoses   Final diagnoses:  Viral upper respiratory tract infection with cough     Discharge Instructions      It appears that you have a viral respiratory infection that should run its course and self resolve with symptomatic treatment.  I have prescribed you 2 medications to alleviate symptoms.  COVID and flu test are pending.  We will call if they are positive.  Please follow-up if symptoms persist or worsen.    ED Prescriptions     Medication Sig Dispense Auth. Provider   benzonatate (TESSALON) 100 MG capsule Take 1 capsule (100 mg total) by mouth  every 8 (eight) hours as needed for cough. 21 capsule [place], [place] E, [place]   fluticasone [facility]) 50 MCG/ACT nasal spray Place 1 spray into both nostrils daily. 16 g Teodora Medici, [place]      PDMP not reviewed this encounter.   Teodora Medici,  08/15/22 1529

## 2022-08-15 NOTE — Discharge Instructions (Signed)
It appears that you have a viral respiratory infection that should run its course and self resolve with symptomatic treatment.  I have prescribed you 2 medications to alleviate symptoms.  COVID and flu test are pending.  We will call if they are positive.  Please follow-up if symptoms persist or worsen.

## 2022-08-15 NOTE — ED Triage Notes (Signed)
Pt is present today with congestion and sore throat x3 days

## 2022-08-20 ENCOUNTER — Other Ambulatory Visit: Payer: Self-pay | Admitting: Family

## 2022-08-20 DIAGNOSIS — Z794 Long term (current) use of insulin: Secondary | ICD-10-CM

## 2022-08-20 NOTE — Telephone Encounter (Unsigned)
Copied from [place] [id number]. Topic: General - Other >> Aug 20, 2022  4:00 PM Everette C wrote: Reason for CRM: Medication Refill - Medication: Rx #: [id number]  gabapentin (NEURONTIN) 300 MG capsule [[id number]]      Has the patient contacted their pharmacy? Yes.   (Agent: If no, request that the patient contact the pharmacy for the refill. If patient does not wish to contact the pharmacy document the reason why and proceed with request.) (Agent: If yes, when and what did the pharmacy advise?)  Preferred Pharmacy (with phone number or street name): [facility] [address] [place] [address] Phone: [phone] Fax: [phone] Hours: M-F 7:30a-6:00p   Has the patient been seen for an appointment in the last year OR does the patient have an upcoming appointment? Yes.    Agent: Please be advised that RX refills may take up to 3 business days. We ask that you follow-up with your pharmacy.

## 2022-08-20 NOTE — Telephone Encounter (Signed)
Called pt and made follow up appt. 

## 2022-08-21 MED ORDER — TAMSULOSIN HCL 0.4 MG PO CAPS
0.4000 mg | ORAL_CAPSULE | Freq: Every day | ORAL | 0 refills | Status: AC
  Filled 2022-08-21: qty 10, 10d supply, fill #0

## 2022-08-23 ENCOUNTER — Other Ambulatory Visit: Payer: Self-pay

## 2022-08-23 MED ORDER — GABAPENTIN 300 MG PO CAPS
600.0000 mg | ORAL_CAPSULE | Freq: Four times a day (QID) | ORAL | 2 refills | Status: DC
  Filled 2022-08-23: qty 240, 30d supply, fill #0
  Filled 2022-10-02: qty 240, 30d supply, fill #1
  Filled 2022-11-16: qty 240, 30d supply, fill #2

## 2022-08-23 NOTE — Telephone Encounter (Signed)
Requested Prescriptions  Pending Prescriptions Disp Refills   gabapentin (NEURONTIN) 300 MG capsule 240 capsule 2    Sig: Take 2 capsules (600 mg total) by mouth 4 (four) times daily.     Neurology: Anticonvulsants - gabapentin Failed - 08/20/2022  4:05 PM      Failed - Completed PHQ-2 or PHQ-9 in the last 360 days      Passed - Cr in normal range and within 360 days    Creat  Date Value Ref Range Status  10/07/2016 1.00 0.50 - 1.10 mg/dL Final   Creatinine, Ser  Date Value Ref Range Status  07/01/2022 0.85 0.44 - 1.00 mg/dL Final         Passed - Valid encounter within last 12 months    Recent Outpatient Visits           11 months ago COVID-19   Primary Care at [facility], Kriste Basque, NP   1 year ago Type 2 diabetes mellitus with diabetic mononeuropathy, with long-term current use of insulin [facility])   Primary Care at [facility], [place], NP   1 year ago Encounter to establish care   Primary Care at [facility], Amy J, NP   1 year ago Type 2 diabetes mellitus with other specified complication, with long-term current use of insulin [facility])   [place], [place] W, NP   1 year ago Type 2 diabetes mellitus without complication, without long-term current use of insulin [facility])   [place], Vernia Buff, NP       Future Appointments             In 4 days Camillia Herter, NP Primary Care at [facility]

## 2022-08-25 NOTE — Progress Notes (Deleted)
Patient ID: Michelle Gibson, female    DOB: 30-Aug-1976  MRN: [id number]  CC: No chief complaint on file.   Subjective: Michelle Gibson is a 46 y.o. female who presents for  Her concerns today include:   Follow-up, unsure CC    Patient Active Problem List   Diagnosis Date Noted   Migraine 08/18/2021   Diabetes mellitus with hyperglycemia ([place]) 06/08/2021   Uncontrolled type 2 diabetes mellitus with hyperglycemia ([place]) 06/08/2021   Nausea 06/08/2021   Hyperlipidemia 06/08/2021   History of COVID-19 03/19/2021   Tachycardia 03/19/2021   Palpitations 03/19/2021   Cough 03/19/2021   Complete tear of right ACL, initial encounter 12/10/2020   Acute tear lateral meniscus, right, initial encounter 08/29/2019   Acute medial meniscus tear of right knee 08/22/2019   Fever and chills 11/17/2018   Closed fracture of upper end of fibula 09/21/2018   WPW (Wolff-Parkinson-White syndrome) 10/10/2017   Asthma with acute exacerbation 09/15/2016   Hyperglycemia 09/15/2016   History of Wolff-Parkinson-White (WPW) syndrome 09/15/2016   Acute respiratory failure with hypoxia ([place]) 09/15/2016   Bronchospasm 09/11/2016     Current Outpatient Medications on File Prior to Visit  Medication Sig Dispense Refill   atorvastatin (LIPITOR) 20 MG tablet Take 1 tablet (20 mg total) by mouth daily. 90 tablet 3   benzonatate (TESSALON) 100 MG capsule Take 1 capsule (100 mg total) by mouth every 8 (eight) hours as needed for cough. 21 capsule 0   Blood Glucose Monitoring Suppl (ONETOUCH VERIO) w/Device KIT Use to check blood sugar TID. 1 kit 0   Continuous Blood Gluc Receiver (DEXCOM G6 RECEIVER) DEVI 1 each by Does not apply route 4 (four) times daily -  before meals and at bedtime. 1 each 0   Continuous Blood Gluc Sensor (DEXCOM G6 SENSOR) MISC 1 each by Does not apply route 4 (four) times daily -  before meals and at bedtime. 1 each 1   Continuous Blood Gluc Transmit (DEXCOM G6 TRANSMITTER) MISC 1 each by  Does not apply route 4 (four) times daily -  before meals and at bedtime. 1 each 1   fluconazole (DIFLUCAN) 150 MG tablet Take 1 tablet by mouth once. Repeat in 3 days if needed. 2 tablet 0   fluticasone (FLONASE) 50 MCG/ACT nasal spray Place 1 spray into both nostrils daily. 16 g 0   gabapentin (NEURONTIN) 300 MG capsule Take 2 capsules (600 mg total) by mouth 4 (four) times daily. 240 capsule 2   hydrOXYzine (VISTARIL) 25 MG capsule Take 1 capsule (25 mg total) by mouth every 8 (eight) hours as needed. 60 capsule 1   insulin aspart (NOVOLOG) 100 UNIT/ML injection Inject 4 Units into the skin 3 (three) times daily with meals. 10 mL 11   insulin glargine (LANTUS) 100 UNIT/ML Solostar Pen Inject 20 Units into the skin daily. 18 mL 0   insulin lispro (HUMALOG) 100 UNIT/ML KwikPen Inject 4 units three times daily with meals and sliding scale instructions three times daily. CBG 70 - 120: 0 units CBG 121 - 150: 2 units CBG 151 - 200: 3 units CBG 201 - 250: 5 units CBG 251 - 300: 8 units CBG 301 - 350: 11 units CBG 351 - 400: 15 units 15 mL 3   ipratropium-albuterol (DUONEB) 0.5-2.5 (3) MG/3ML SOLN Take 3 mLs by nebulization every 6 (six) hours as needed (wheezing).     metFORMIN (GLUCOPHAGE) 1000 MG tablet Take 0.5 tablets (500 mg total) by mouth 2 (  two) times daily with a meal. 90 tablet 0   nitrofurantoin, macrocrystal-monohydrate, (MACROBID) 100 MG capsule Take 1 capsule (100 mg total) by mouth 2 (two) times daily. 10 capsule 0   nystatin cream (MYCOSTATIN) Apply topically 2 (two) times daily as needed (apply to rash). 30 g 0   ondansetron (ZOFRAN-ODT) 4 MG disintegrating tablet Place 1 tab under the tongue every 4 hours as needed for nausea and vomitting 12 tablet 0   OneTouch Delica Lancets 93T MISC Use to check blood sugar TID. 100 each 2   oseltamivir (TAMIFLU) 75 MG capsule Take 1 capsule (75 mg total) by mouth every 12 (twelve) hours. 10 capsule 0   oxyCODONE-acetaminophen (PERCOCET) 5-325 MG  tablet Take 1 tablet by mouth every 6 (six) hours as needed. 20 tablet 0   sertraline (ZOLOFT) 100 MG tablet TAKE 1 TABLET (100 MG TOTAL) BY MOUTH DAILY. 90 tablet 1   SUMAtriptan (IMITREX) 25 MG tablet Take 25 mg (1 tablet) total by mouth at the start of the headache. May repeat in 2 hours x 1 if headache persists. Max of 2 tabs/24 hours 30 tablet 0   tamsulosin (FLOMAX) 0.4 MG CAPS capsule Take 1 capsule (0.4 mg total) by mouth daily. 10 capsule 0   topiramate (TOPAMAX) 50 MG tablet Take 1 tablet (50 mg total) by mouth daily as needed (migraine).     [DISCONTINUED] metoCLOPramide (REGLAN) 10 MG tablet Take 1 tablet (10 mg total) by mouth every 8 (eight) hours as needed for nausea or vomiting (headache). (Patient not taking: Reported on 07/14/2020) 5 tablet 0   [DISCONTINUED] traZODone (DESYREL) 50 MG tablet Take 0.5-1 tablets (25-50 mg total) by mouth at bedtime as needed for sleep. 30 tablet 3   No current facility-administered medications on file prior to visit.    Allergies  Allergen Reactions   Ciprofloxacin Hives   Lyrica Cr [Pregabalin Er] Other (See Comments)    Suicidal ideation   Doxycycline Rash    Yeast infection   Flecainide Hives, Nausea And Vomiting and Rash   Latex Rash and Other (See Comments)    Causes blisters, also   Penicillin G Rash    Has patient had a PCN reaction causing immediate rash, facial/tongue/throat swelling, SOB or lightheadedness with hypotension: yes Has patient had a PCN reaction causing severe rash involving mucus membranes or skin necrosis: yes Has patient had a PCN reaction that required hospitalization: no Has patient had a PCN reaction occurring within the last 10 years: no If all of the above answers are "NO", then may proceed with Cephalosporin use.     Social History   Socioeconomic History   Marital status: Divorced    Spouse name: Not on file   Number of children: Not on file   Years of education: Not on file   Highest education  level: Not on file  Occupational History   Not on file  Tobacco Use   Smoking status: Some Days    Packs/day: 0.10    Types: Cigarettes   Smokeless tobacco: Never  Vaping Use   Vaping Use: Never used  Substance and Sexual Activity   Alcohol use: No   Drug use: No   Sexual activity: Not Currently    Birth control/protection: Surgical  Other Topics Concern   Not on file  Social History Narrative   Not on file   Social Determinants of Health   Financial Resource Strain: Not on file  Food Insecurity: Not on file  Transportation Needs:  Not on file  Physical Activity: Not on file  Stress: Not on file  Social Connections: Not on file  Intimate Partner Violence: Not on file    Family History  Problem Relation Age of Onset   Hypertension Mother    CAD Mother 4   Diabetes Mother    Hypertension Father    CAD Father 11       CABG    Past Surgical History:  Procedure Laterality Date   ABDOMINAL HYSTERECTOMY     CARDIAC SURGERY     CESAREAN SECTION     KNEE ARTHROSCOPY WITH LATERAL MENISECTOMY Right 08/29/2019   Procedure: PARTIAL LATERAL MENISECTOMY;  Surgeon: Leandrew Koyanagi, MD;  Location: [facility];  Service: Orthopedics;  Laterality: Right;   KNEE ARTHROSCOPY WITH MEDIAL MENISECTOMY Right 08/29/2019   Procedure: RIGHT KNEE ARTHROSCOPY WITH PARTIAL MEDIAL MENISECTOMY;  Surgeon: Leandrew Koyanagi, MD;  Location: [facility];  Service: Orthopedics;  Laterality: Right;    ROS: Review of Systems Negative except as stated above  PHYSICAL EXAM: There were no vitals taken for this visit.  Physical Exam  {female adult master:[id number]} {female adult master:[id number]}     Latest Ref Rng & Units 07/01/2022    3:25 PM 10/22/2021    8:13 AM 08/18/2021    2:17 PM  CMP  Glucose 70 - 99 mg/dL 308  221  143   BUN 6 - 20 mg/dL _0 Creatinine 0.44 - 1.00 mg/dL 0.85  0.93  0.98   Sodium 135 - 145 mmol/L 137  139  141   Potassium 3.5 - 5.1 mmol/L  4.1  3.6  4.2   Chloride 98 - 111 mmol/L 100  101  99   CO2 22 - 32 mmol/L _1 Calcium 8.9 - 10.3 mg/dL 9.4  9.4  10.3   Total Protein 6.5 - 8.1 g/dL  7.0  8.0   Total Bilirubin 0.3 - 1.2 mg/dL  0.3  <0.2   Alkaline Phos 38 - 126 U/L  98  115   AST 15 - 41 U/L  19  14   ALT 0 - 44 U/L  29  12    Lipid Panel     Component Value Date/Time   TRIG 494 (H) 03/09/2021 1739    CBC    Component Value Date/Time   WBC 6.9 07/01/2022 1525   RBC 4.55 07/01/2022 1525   HGB 14.0 07/01/2022 1525   HGB 14.7 08/18/2021 1417   HCT 40.2 07/01/2022 1525   HCT 44.1 08/18/2021 1417   PLT 350 07/01/2022 1525   PLT 430 08/18/2021 1417   MCV 88.4 07/01/2022 1525   MCV 87 08/18/2021 1417   MCH 30.8 07/01/2022 1525   MCHC 34.8 07/01/2022 1525   RDW 12.5 07/01/2022 1525   RDW 13.1 08/18/2021 1417   LYMPHSABS 1.3 03/09/2021 1724   MONOABS 0.2 03/09/2021 1724   EOSABS 0.1 03/09/2021 1724   BASOSABS 0.0 03/09/2021 1724    ASSESSMENT AND PLAN:  There are no diagnoses linked to this encounter.   Patient was given the opportunity to ask questions.  Patient verbalized understanding of the plan and was able to repeat key elements of the plan. Patient was given clear instructions to go to Emergency Department or return to medical center if symptoms don't improve, worsen, or new problems develop.The patient verbalized understanding.   No orders of the defined types were placed  in this encounter.    Requested Prescriptions    No prescriptions requested or ordered in this encounter    No follow-ups on file.  Camillia Herter, NP

## 2022-08-26 ENCOUNTER — Other Ambulatory Visit: Payer: Self-pay

## 2022-08-27 ENCOUNTER — Ambulatory Visit: Payer: Self-pay | Admitting: Family

## 2022-09-15 ENCOUNTER — Other Ambulatory Visit: Payer: Self-pay

## 2022-09-15 MED ORDER — BENZONATATE 100 MG PO CAPS
100.0000 mg | ORAL_CAPSULE | Freq: Three times a day (TID) | ORAL | 0 refills | Status: DC
  Filled 2022-09-15: qty 21, 7d supply, fill #0

## 2022-09-15 MED ORDER — FLUTICASONE PROPIONATE 50 MCG/ACT NA SUSP
1.0000 | Freq: Every day | NASAL | 0 refills | Status: DC
  Filled 2022-09-15: qty 16, 60d supply, fill #0

## 2022-09-28 ENCOUNTER — Other Ambulatory Visit: Payer: Self-pay

## 2022-10-05 ENCOUNTER — Other Ambulatory Visit: Payer: Self-pay

## 2022-10-06 ENCOUNTER — Other Ambulatory Visit: Payer: Self-pay

## 2022-10-27 ENCOUNTER — Other Ambulatory Visit: Payer: Self-pay

## 2022-11-16 ENCOUNTER — Other Ambulatory Visit: Payer: Self-pay

## 2022-11-17 ENCOUNTER — Other Ambulatory Visit: Payer: Self-pay

## 2022-12-22 ENCOUNTER — Other Ambulatory Visit: Payer: Self-pay | Admitting: Family

## 2022-12-22 ENCOUNTER — Other Ambulatory Visit: Payer: Self-pay

## 2022-12-22 DIAGNOSIS — Z794 Long term (current) use of insulin: Secondary | ICD-10-CM

## 2022-12-22 DIAGNOSIS — E1141 Type 2 diabetes mellitus with diabetic mononeuropathy: Secondary | ICD-10-CM

## 2022-12-22 MED ORDER — GABAPENTIN 300 MG PO CAPS
600.0000 mg | ORAL_CAPSULE | Freq: Four times a day (QID) | ORAL | 0 refills | Status: DC
  Filled 2022-12-22: qty 240, 30d supply, fill #0

## 2022-12-22 NOTE — Telephone Encounter (Signed)
Patient has appointment 3/20- will give courtesy refill. Requested Prescriptions  Pending Prescriptions Disp Refills   gabapentin (NEURONTIN) 300 MG capsule 240 capsule 0    Sig: Take 2 capsules (600 mg total) by mouth 4 (four) times daily.     Neurology: Anticonvulsants - gabapentin Failed - 12/22/2022  9:06 AM      Failed - Completed PHQ-2 or PHQ-9 in the last 360 days      Failed - Valid encounter within last 12 months    Recent Outpatient Visits           1 year ago [facility], Kriste Basque, NP   1 year ago Type 2 diabetes mellitus with diabetic mononeuropathy, with long-term current use of insulin [facility])   [facility], [place], NP   1 year ago Encounter to establish care   [facility], [place], NP   1 year ago Type 2 diabetes mellitus with other specified complication, with long-term current use of insulin [facility])   [place], [place] W, NP   2 years ago Type 2 diabetes mellitus without complication, without long-term current use of insulin [facility])   [place], Vernia Buff, NP       Future Appointments             In 1 week Camillia Herter, NP [facility] [place] in normal range and within 360 days    Creat  Date Value Ref Range Status  10/07/2016 1.00 0.50 - 1.10 mg/dL Final   Creatinine, Ser  Date Value Ref Range Status  07/01/2022 0.85 0.44 - 1.00 mg/dL Final

## 2022-12-22 NOTE — Telephone Encounter (Signed)
Medication Refill - Medication: gabapentin (NEURONTIN) 300 MG capsule   Has the patient contacted their pharmacy? No. (Agent: If no, request that the patient contact the pharmacy for the refill. If patient does not wish to contact the pharmacy document the reason why and proceed with request.) (Agent: If yes, when and what did the pharmacy advise?)  Preferred Pharmacy (with phone number or street name):  [place] Phone: [phone]  Fax: [phone]     Has the patient been seen for an appointment in the last year OR does the patient have an upcoming appointment? Yes.    Agent: Please be advised that RX refills may take up to 3 business days. We ask that you follow-up with your pharmacy.

## 2022-12-27 NOTE — Progress Notes (Unsigned)
Patient ID: Michelle Gibson, female    DOB: 30-Sep-1976  MRN: [id number]  CC: Leg Pain  Subjective: Michelle Gibson is a 47 y.o. female who presents for leg pain.   Her concerns today include:  Patient reports chronic bilateral leg pain and numbness for months. States legs feel like she has pens and needles in them with burning sensation like "bee stings". No recent trauma/injury. She has a history of neuropathy. Gabapentin not helping. Requests referral for eye exam. 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 Recently vision of right eye becoming blurry. No further issues/concerns for discussion today.   Patient Active Problem List   Diagnosis Date Noted   Migraine 08/18/2021   Diabetes mellitus with hyperglycemia ([place]) 06/08/2021   Uncontrolled type 2 diabetes mellitus with hyperglycemia ([place]) 06/08/2021   Nausea 06/08/2021   Hyperlipidemia 06/08/2021   History of COVID-19 03/19/2021   Tachycardia 03/19/2021   Palpitations 03/19/2021   Cough 03/19/2021   Complete tear of right ACL, initial encounter 12/10/2020   Acute tear lateral meniscus, right, initial encounter 08/29/2019   Acute medial meniscus tear of right knee 08/22/2019   Fever and chills 11/17/2018   Closed fracture of upper end of fibula 09/21/2018   WPW (Wolff-Parkinson-White syndrome) 10/10/2017   Asthma with acute exacerbation 09/15/2016   Hyperglycemia 09/15/2016   History of Wolff-Parkinson-White (WPW) syndrome 09/15/2016   Acute respiratory failure with hypoxia ([place]) 09/15/2016   Bronchospasm 09/11/2016     Current Outpatient Medications on File Prior to Visit  Medication Sig Dispense Refill   atorvastatin (LIPITOR) 20 MG tablet Take 1 tablet (20 mg total) by mouth daily. 90 tablet 3   benzonatate (TESSALON) 100 MG capsule Take 1 capsule (100 mg total) by mouth every 8 (eight) hours as needed for cough. 21 capsule 0   benzonatate (TESSALON) 100 MG capsule Take 1 capsule (100 mg total) by mouth every 8  (eight) hours. 21 capsule 0   Blood Glucose Monitoring Suppl (ONETOUCH VERIO) w/Device KIT Use to check blood sugar TID. 1 kit 0   Continuous Blood Gluc Receiver (DEXCOM G6 RECEIVER) DEVI 1 each by Does not apply route 4 (four) times daily -  before meals and at bedtime. 1 each 0   Continuous Blood Gluc Sensor (DEXCOM G6 SENSOR) MISC 1 each by Does not apply route 4 (four) times daily -  before meals and at bedtime. 1 each 1   Continuous Blood Gluc Transmit (DEXCOM G6 TRANSMITTER) MISC 1 each by Does not apply route 4 (four) times daily -  before meals and at bedtime. 1 each 1   fluconazole (DIFLUCAN) 150 MG tablet Take 1 tablet by mouth once. Repeat in 3 days if needed. 2 tablet 0   fluticasone (FLONASE) 50 MCG/ACT nasal spray Place 1 spray into both nostrils daily. 16 g 0   fluticasone (FLONASE) 50 MCG/ACT nasal spray Place 1 spray into both nostrils daily. 16 g 0   gabapentin (NEURONTIN) 300 MG capsule Take 2 capsules (600 mg total) by mouth 4 (four) times daily. 240 capsule 0   hydrOXYzine (VISTARIL) 25 MG capsule Take 1 capsule (25 mg total) by mouth every 8 (eight) hours as needed. 60 capsule 1   insulin aspart (NOVOLOG) 100 UNIT/ML injection Inject 4 Units into the skin 3 (three) times daily with meals. 10 mL 11   insulin glargine (LANTUS) 100 UNIT/ML Solostar Pen Inject 20 Units into the skin daily. 18 mL 0   insulin lispro (HUMALOG) 100 UNIT/ML  KwikPen Inject 4 units three times daily with meals and sliding scale instructions three times daily. CBG 70 - 120: 0 units CBG 121 - 150: 2 units CBG 151 - 200: 3 units CBG 201 - 250: 5 units CBG 251 - 300: 8 units CBG 301 - 350: 11 units CBG 351 - 400: 15 units 15 mL 3   ipratropium-albuterol (DUONEB) 0.5-2.5 (3) MG/3ML SOLN Take 3 mLs by nebulization every 6 (six) hours as needed (wheezing).     metFORMIN (GLUCOPHAGE) 1000 MG tablet Take 0.5 tablets (500 mg total) by mouth 2 (two) times daily with a meal. 90 tablet 0   nitrofurantoin,  macrocrystal-monohydrate, (MACROBID) 100 MG capsule Take 1 capsule (100 mg total) by mouth 2 (two) times daily. 10 capsule 0   nystatin cream (MYCOSTATIN) Apply topically 2 (two) times daily as needed (apply to rash). 30 g 0   ondansetron (ZOFRAN-ODT) 4 MG disintegrating tablet Place 1 tab under the tongue every 4 hours as needed for nausea and vomitting 12 tablet 0   OneTouch Delica Lancets 99991111 MISC Use to check blood sugar TID. 100 each 2   oseltamivir (TAMIFLU) 75 MG capsule Take 1 capsule (75 mg total) by mouth every 12 (twelve) hours. 10 capsule 0   oxyCODONE-acetaminophen (PERCOCET) 5-325 MG tablet Take 1 tablet by mouth every 6 (six) hours as needed. 20 tablet 0   sertraline (ZOLOFT) 100 MG tablet TAKE 1 TABLET (100 MG TOTAL) BY MOUTH DAILY. 90 tablet 1   SUMAtriptan (IMITREX) 25 MG tablet Take 25 mg (1 tablet) total by mouth at the start of the headache. May repeat in 2 hours x 1 if headache persists. Max of 2 tabs/24 hours 30 tablet 0   tamsulosin (FLOMAX) 0.4 MG CAPS capsule Take 1 capsule (0.4 mg total) by mouth daily. 10 capsule 0   topiramate (TOPAMAX) 50 MG tablet Take 1 tablet (50 mg total) by mouth daily as needed (migraine).     [DISCONTINUED] metoCLOPramide (REGLAN) 10 MG tablet Take 1 tablet (10 mg total) by mouth every 8 (eight) hours as needed for nausea or vomiting (headache). (Patient not taking: Reported on 07/14/2020) 5 tablet 0   [DISCONTINUED] traZODone (DESYREL) 50 MG tablet Take 0.5-1 tablets (25-50 mg total) by mouth at bedtime as needed for sleep. 30 tablet 3   No current facility-administered medications on file prior to visit.    Allergies  Allergen Reactions   Ciprofloxacin Hives   Lyrica Cr [Pregabalin Er] Other (See Comments)    Suicidal ideation   Doxycycline Rash    Yeast infection   Flecainide Hives, Nausea And Vomiting and Rash   Latex Rash and Other (See Comments)    Causes blisters, also   Penicillin G Rash    Has patient had a PCN reaction causing  immediate rash, facial/tongue/throat swelling, SOB or lightheadedness with hypotension: yes Has patient had a PCN reaction causing severe rash involving mucus membranes or skin necrosis: yes Has patient had a PCN reaction that required hospitalization: no Has patient had a PCN reaction occurring within the last 10 years: no If all of the above answers are "NO", then may proceed with Cephalosporin use.     Social History   Socioeconomic History   Marital status: Divorced    Spouse name: Not on file   Number of children: Not on file   Years of education: Not on file   Highest education level: Not on file  Occupational History   Not on file  Tobacco  Use   Smoking status: Some Days    Packs/day: .1    Types: Cigarettes    Passive exposure: Current   Smokeless tobacco: Never  Vaping Use   Vaping Use: Never used  Substance and Sexual Activity   Alcohol use: No   Drug use: No   Sexual activity: Not Currently    Birth control/protection: Surgical  Other Topics Concern   Not on file  Social History Narrative   Not on file   Social Determinants of Health   Financial Resource Strain: Not on file  Food Insecurity: Not on file  Transportation Needs: Not on file  Physical Activity: Not on file  Stress: Not on file  Social Connections: Not on file  Intimate Partner Violence: Not on file    Family History  Problem Relation Age of Onset   Hypertension Mother    CAD Mother 38   Diabetes Mother    Hypertension Father    CAD Father 39       CABG    Past Surgical History:  Procedure Laterality Date   ABDOMINAL HYSTERECTOMY     CARDIAC SURGERY     CESAREAN SECTION     KNEE ARTHROSCOPY WITH LATERAL MENISECTOMY Right 08/29/2019   Procedure: PARTIAL LATERAL MENISECTOMY;  Surgeon: Leandrew Koyanagi, MD;  Location: [facility];  Service: Orthopedics;  Laterality: Right;   KNEE ARTHROSCOPY WITH MEDIAL MENISECTOMY Right 08/29/2019   Procedure: RIGHT KNEE ARTHROSCOPY WITH  PARTIAL MEDIAL MENISECTOMY;  Surgeon: Leandrew Koyanagi, MD;  Location: [facility];  Service: Orthopedics;  Laterality: Right;    ROS: Review of Systems Negative except as stated above  PHYSICAL EXAM: BP 127/87 (BP Location: Left Arm, Patient Position: Sitting, Cuff Size: Large)   Pulse 91   Temp 98 F (36.7 C)   Resp 16   Ht 5' 5.98" (1.676 m)   Wt 180 lb (81.6 kg)   SpO2 96%   BMI 29.07 kg/m   Physical Exam HENT:     Head: Normocephalic and atraumatic.     Nose: Nose normal.     Mouth/Throat:     Mouth: Mucous membranes are moist.     Pharynx: Oropharynx is clear.  Eyes:     Extraocular Movements: Extraocular movements intact.     Conjunctiva/sclera: Conjunctivae normal.     Pupils: Pupils are equal, round, and reactive to light.  Cardiovascular:     Rate and Rhythm: Normal rate and regular rhythm.     Pulses: Normal pulses.     Heart sounds: Normal heart sounds.  Pulmonary:     Effort: Pulmonary effort is normal.     Breath sounds: Normal breath sounds.  Musculoskeletal:     Cervical back: Normal range of motion and neck supple.     Right hip: Normal.     Left hip: Normal.     Right upper leg: Normal.     Left upper leg: Normal.     Right knee: Normal.     Left knee: Normal.     Right lower leg: Normal.     Left lower leg: Normal.     Right ankle: Normal.     Left ankle: Normal.     Right foot: Normal.     Left foot: Normal.  Neurological:     General: No focal deficit present.     Mental Status: She is alert and oriented to person, place, and time.  Psychiatric:  Mood and Affect: Mood normal.        Behavior: Behavior normal.      ASSESSMENT AND PLAN: 1. Neuropathy 2. Neuropathy involving both lower extremities - Referral to Neurology for further evaluation/management.  - Ambulatory referral to Neurology  3. Diabetic eye exam ([place]) 4. Blindness of left eye, unspecified right eye visual impairment category 5. Blurry vision, right  eye - Referral to Ophthalmology for further evaluation/management.  - Ambulatory referral to Ophthalmology  6. Financial difficulty - Offered patient [place] financial discount/orange card. Discussed patient will need to mail in completed application for processing and schedule appointment with financial counselor. Patient verbalized understanding.   Patient was given the opportunity to ask questions.  Patient verbalized understanding of the plan and was able to repeat key elements of the plan. Patient was given clear instructions to go to Emergency Department or return to medical center if symptoms don't improve, worsen, or new problems develop.The patient verbalized understanding.   Orders Placed This Encounter  Procedures   Ambulatory referral to Neurology   Ambulatory referral to Ophthalmology    Follow-up with primary provider as scheduled.   Camillia Herter, NP

## 2022-12-29 ENCOUNTER — Other Ambulatory Visit: Payer: Self-pay | Admitting: Family

## 2022-12-29 ENCOUNTER — Ambulatory Visit (INDEPENDENT_AMBULATORY_CARE_PROVIDER_SITE_OTHER): Payer: Self-pay | Admitting: Family

## 2022-12-29 ENCOUNTER — Encounter: Payer: Self-pay | Admitting: Family

## 2022-12-29 ENCOUNTER — Encounter: Payer: Self-pay | Admitting: Neurology

## 2022-12-29 ENCOUNTER — Other Ambulatory Visit: Payer: Self-pay

## 2022-12-29 VITALS — BP 127/87 | HR 91 | Temp 98.0°F | Resp 16 | Ht 65.98 in | Wt 180.0 lb

## 2022-12-29 DIAGNOSIS — F17210 Nicotine dependence, cigarettes, uncomplicated: Secondary | ICD-10-CM

## 2022-12-29 DIAGNOSIS — Z0100 Encounter for examination of eyes and vision without abnormal findings: Secondary | ICD-10-CM

## 2022-12-29 DIAGNOSIS — H5440 Blindness, one eye, unspecified eye: Secondary | ICD-10-CM

## 2022-12-29 DIAGNOSIS — E119 Type 2 diabetes mellitus without complications: Secondary | ICD-10-CM

## 2022-12-29 DIAGNOSIS — E1141 Type 2 diabetes mellitus with diabetic mononeuropathy: Secondary | ICD-10-CM

## 2022-12-29 DIAGNOSIS — G5793 Unspecified mononeuropathy of bilateral lower limbs: Secondary | ICD-10-CM

## 2022-12-29 DIAGNOSIS — E1165 Type 2 diabetes mellitus with hyperglycemia: Secondary | ICD-10-CM

## 2022-12-29 DIAGNOSIS — G629 Polyneuropathy, unspecified: Secondary | ICD-10-CM

## 2022-12-29 DIAGNOSIS — Z599 Problem related to housing and economic circumstances, unspecified: Secondary | ICD-10-CM

## 2022-12-29 DIAGNOSIS — H538 Other visual disturbances: Secondary | ICD-10-CM

## 2022-12-29 LAB — POCT GLYCOSYLATED HEMOGLOBIN (HGB A1C): Hemoglobin A1C: 8 % — AB (ref 4.0–5.6)

## 2022-12-29 MED ORDER — PEN NEEDLES 31G X 8 MM MISC
6 refills | Status: DC
  Filled 2022-12-29: qty 100, 25d supply, fill #0

## 2022-12-29 MED ORDER — INSULIN ASPART 100 UNIT/ML IJ SOLN
4.0000 [IU] | Freq: Three times a day (TID) | INTRAMUSCULAR | 11 refills | Status: DC
  Filled 2022-12-29: qty 10, 84d supply, fill #0

## 2022-12-29 MED ORDER — INSULIN LISPRO (1 UNIT DIAL) 100 UNIT/ML (KWIKPEN)
PEN_INJECTOR | SUBCUTANEOUS | 3 refills | Status: DC
  Filled 2022-12-29: qty 15, 33d supply, fill #0

## 2022-12-29 MED ORDER — INSULIN GLARGINE 100 UNIT/ML SOLOSTAR PEN
25.0000 [IU] | PEN_INJECTOR | Freq: Every day | SUBCUTANEOUS | 1 refills | Status: DC
  Filled 2022-12-29: qty 9, 36d supply, fill #0

## 2022-12-29 MED ORDER — METFORMIN HCL 1000 MG PO TABS
1000.0000 mg | ORAL_TABLET | Freq: Two times a day (BID) | ORAL | 0 refills | Status: DC
  Filled 2022-12-29: qty 180, 90d supply, fill #0

## 2022-12-29 NOTE — Addendum Note (Signed)
Addended by: Elmon Else on: 12/29/2022 02:50 PM   Modules accepted: Orders

## 2022-12-29 NOTE — Progress Notes (Signed)
Pt presents for leg pain -states that pain feels like its at the bone -pt states she does not have vision in left eye and now right eye is going blind -pt is uninsured.

## 2023-01-05 ENCOUNTER — Emergency Department (HOSPITAL_BASED_OUTPATIENT_CLINIC_OR_DEPARTMENT_OTHER)
Admission: EM | Admit: 2023-01-05 | Discharge: 2023-01-05 | Disposition: A | Discharge status: Home / Self Care | Payer: Medicaid Other | Attending: Emergency Medicine | Admitting: Emergency Medicine

## 2023-01-05 ENCOUNTER — Emergency Department (HOSPITAL_BASED_OUTPATIENT_CLINIC_OR_DEPARTMENT_OTHER): Payer: Medicaid Other

## 2023-01-05 ENCOUNTER — Encounter (HOSPITAL_BASED_OUTPATIENT_CLINIC_OR_DEPARTMENT_OTHER): Payer: Self-pay | Admitting: Emergency Medicine

## 2023-01-05 ENCOUNTER — Ambulatory Visit
Admission: EM | Admit: 2023-01-05 | Discharge: 2023-01-05 | Disposition: A | Discharge status: Home / Self Care | Payer: Commercial Managed Care - PPO

## 2023-01-05 DIAGNOSIS — Z91040 Latex allergy status: Secondary | ICD-10-CM | POA: Diagnosis not present

## 2023-01-05 DIAGNOSIS — H538 Other visual disturbances: Secondary | ICD-10-CM

## 2023-01-05 DIAGNOSIS — Z20822 Contact with and (suspected) exposure to covid-19: Secondary | ICD-10-CM | POA: Insufficient documentation

## 2023-01-05 DIAGNOSIS — R519 Headache, unspecified: Secondary | ICD-10-CM | POA: Diagnosis present

## 2023-01-05 DIAGNOSIS — M5481 Occipital neuralgia: Secondary | ICD-10-CM | POA: Insufficient documentation

## 2023-01-05 DIAGNOSIS — R220 Localized swelling, mass and lump, head: Secondary | ICD-10-CM

## 2023-01-05 LAB — SARS CORONAVIRUS 2 BY RT PCR: SARS Coronavirus 2 by RT PCR: NEGATIVE

## 2023-01-05 LAB — CBC WITH DIFFERENTIAL/PLATELET
Abs Immature Granulocytes: 0.02 10*3/uL (ref 0.00–0.07)
Basophils Absolute: 0.1 10*3/uL (ref 0.0–0.1)
Basophils Relative: 1 %
Eosinophils Absolute: 0.2 10*3/uL (ref 0.0–0.5)
Eosinophils Relative: 2 %
HCT: 41.6 % (ref 36.0–46.0)
Hemoglobin: 14.3 g/dL (ref 12.0–15.0)
Immature Granulocytes: 0 %
Lymphocytes Relative: 36 %
Lymphs Abs: 3.5 10*3/uL (ref 0.7–4.0)
MCH: 29.4 pg (ref 26.0–34.0)
MCHC: 34.4 g/dL (ref 30.0–36.0)
MCV: 85.4 fL (ref 80.0–100.0)
Monocytes Absolute: 0.4 10*3/uL (ref 0.1–1.0)
Monocytes Relative: 4 %
Neutro Abs: 5.5 10*3/uL (ref 1.7–7.7)
Neutrophils Relative %: 57 %
Platelets: 336 10*3/uL (ref 150–400)
RBC: 4.87 MIL/uL (ref 3.87–5.11)
RDW: 11.9 % (ref 11.5–15.5)
WBC: 9.8 10*3/uL (ref 4.0–10.5)
nRBC: 0 % (ref 0.0–0.2)

## 2023-01-05 LAB — BASIC METABOLIC PANEL
Anion gap: 9 (ref 5–15)
BUN: 20 mg/dL (ref 6–20)
CO2: 27 mmol/L (ref 22–32)
Calcium: 9.2 mg/dL (ref 8.9–10.3)
Chloride: 98 mmol/L (ref 98–111)
Creatinine, Ser: 0.82 mg/dL (ref 0.44–1.00)
GFR, Estimated: >60 mL/min (ref >60)
Glucose, Bld: 139 mg/dL — ABNORMAL HIGH (ref 70–99)
Potassium: 3.7 mmol/L (ref 3.5–5.1)
Sodium: 134 mmol/L — ABNORMAL LOW (ref 135–145)

## 2023-01-05 MED ORDER — IOHEXOL 300 MG/ML  SOLN
75.0000 mL | Freq: Once | INTRAMUSCULAR | Status: AC | PRN
  Administered 2023-01-05: 75 mL via INTRAVENOUS

## 2023-01-05 MED ORDER — DIPHENHYDRAMINE HCL 50 MG/ML IJ SOLN
12.5000 mg | Freq: Once | INTRAMUSCULAR | Status: AC
  Administered 2023-01-05: 12.5 mg via INTRAVENOUS
  Filled 2023-01-05: qty 1

## 2023-01-05 MED ORDER — SODIUM CHLORIDE 0.9 % IV BOLUS
1000.0000 mL | Freq: Once | INTRAVENOUS | Status: AC
  Administered 2023-01-05: 1000 mL via INTRAVENOUS

## 2023-01-05 MED ORDER — SODIUM CHLORIDE 0.9 % IV SOLN: INTRAVENOUS | Status: DC

## 2023-01-05 MED ORDER — BUPIVACAINE HCL 0.5 % IJ SOLN
10.0000 mL | Freq: Once | INTRAMUSCULAR | Status: AC
  Administered 2023-01-05: 10 mL
  Filled 2023-01-05: qty 1

## 2023-01-05 MED ORDER — METOCLOPRAMIDE HCL 5 MG/ML IJ SOLN
10.0000 mg | Freq: Once | INTRAMUSCULAR | Status: AC
  Administered 2023-01-05: 10 mg via INTRAVENOUS
  Filled 2023-01-05: qty 2

## 2023-01-05 NOTE — Discharge Instructions (Addendum)
Your headache improved with an occipital nerve block. Your CT imaging was reassuring as were your labs and physical exam. There was no evidence of infection or mass on exam or imaging. If you develop sign of infection, ie fever, chills, redness and swelling of the area, call your PCP for an antibiotic prescription.

## 2023-01-05 NOTE — ED Provider Notes (Signed)
[name]    CSN: [id number] Arrival date & time: 01/05/23  1452      History   Chief Complaint Chief Complaint  Patient presents with   head edema    HPI Michelle Gibson is a 47 y.o. female.   Patient presents with localized swelling of the back of her head that has been present for about a week.  She reports associated headache that wraps around both sides of her head.  She also reports that she has been having some intermittent blurry vision.  She describes the blurry vision as "fuzziness".  She denies dizziness, nausea, vomiting.  Denies any falls or recent head trauma.  Reports this occurred a few weeks prior as well but resolved on its own.  She denies any fever but does report generalized bodyaches.     Past Medical History:  Diagnosis Date   Acute tear lateral meniscus, right, initial encounter 08/29/2019   Asthma    Closed fracture of upper end of fibula 09/21/2018   Diabetes mellitus without complication ([place])    TYPE II   Hyperglycemia 09/15/2016   Migraines    WPW (Wolff-Parkinson-White syndrome)    s/p ablation at age 70    Patient Active Problem List   Diagnosis Date Noted   Migraine 08/18/2021   Diabetes mellitus with hyperglycemia ([place]) 06/08/2021   Uncontrolled type 2 diabetes mellitus with hyperglycemia ([place]) 06/08/2021   Nausea 06/08/2021   Hyperlipidemia 06/08/2021   History of COVID-19 03/19/2021   Tachycardia 03/19/2021   Palpitations 03/19/2021   Cough 03/19/2021   Complete tear of right ACL, initial encounter 12/10/2020   Acute tear lateral meniscus, right, initial encounter 08/29/2019   Acute medial meniscus tear of right knee 08/22/2019   Fever and chills 11/17/2018   Closed fracture of upper end of fibula 09/21/2018   WPW (Wolff-Parkinson-White syndrome) 10/10/2017   Asthma with acute exacerbation 09/15/2016   Hyperglycemia 09/15/2016   History of Wolff-Parkinson-White (WPW) syndrome 09/15/2016   Acute respiratory  failure with hypoxia ([place]) 09/15/2016   Bronchospasm 09/11/2016    Past Surgical History:  Procedure Laterality Date   ABDOMINAL HYSTERECTOMY     CARDIAC SURGERY     CESAREAN SECTION     KNEE ARTHROSCOPY WITH LATERAL MENISECTOMY Right 08/29/2019   Procedure: PARTIAL LATERAL MENISECTOMY;  Surgeon: Leandrew Koyanagi, MD;  Location: [facility];  Service: Orthopedics;  Laterality: Right;   KNEE ARTHROSCOPY WITH MEDIAL MENISECTOMY Right 08/29/2019   Procedure: RIGHT KNEE ARTHROSCOPY WITH PARTIAL MEDIAL MENISECTOMY;  Surgeon: Leandrew Koyanagi, MD;  Location: [facility];  Service: Orthopedics;  Laterality: Right;    OB History   No obstetric history on file.      Home Medications    Prior to Admission medications   Medication Sig Start Date End Date Taking? Authorizing Provider  atorvastatin (LIPITOR) 20 MG tablet Take 1 tablet (20 mg total) by mouth daily. 05/25/21   Gildardo Pounds, NP  benzonatate (TESSALON) 100 MG capsule Take 1 capsule (100 mg total) by mouth every 8 (eight) hours as needed for cough. 08/15/22   Teodora Medici, FNP  benzonatate (TESSALON) 100 MG capsule Take 1 capsule (100 mg total) by mouth every 8 (eight) hours. 09/15/22     Blood Glucose Monitoring Suppl (ONETOUCH VERIO) w/Device KIT Use to check blood sugar TID. 12/10/20   Charlott Rakes, MD  Continuous Blood Gluc Receiver (DEXCOM G6 RECEIVER) DEVI 1 each by Does not apply route 4 (  four) times daily -  before meals and at bedtime. 06/19/21   Camillia [place], NP  Continuous Blood Gluc Sensor (DEXCOM G6 SENSOR) MISC 1 each by Does not apply route 4 (four) times daily -  before meals and at bedtime. 06/19/21   Camillia Herter, NP  Continuous Blood Gluc Transmit (DEXCOM G6 TRANSMITTER) MISC 1 each by Does not apply route 4 (four) times daily -  before meals and at bedtime. 06/19/21   Camillia Herter, NP  fluconazole (DIFLUCAN) 150 MG tablet Take 1 tablet by mouth once. Repeat in 3 days if needed. 06/29/22    Brunetta Jeans, PA-C  fluticasone (FLONASE) 50 MCG/ACT nasal spray Place 1 spray into both nostrils daily. 08/15/22   Teodora Medici, FNP  fluticasone (FLONASE) 50 MCG/ACT nasal spray Place 1 spray into both nostrils daily. 09/15/22     gabapentin (NEURONTIN) 300 MG capsule Take 2 capsules (600 mg total) by mouth 4 (four) times daily. 12/22/22 01/28/23  Camillia Herter, NP  hydrOXYzine (VISTARIL) 25 MG capsule Take 1 capsule (25 mg total) by mouth every 8 (eight) hours as needed. 06/08/21   Gildardo Pounds, NP  insulin glargine (LANTUS) 100 UNIT/ML Solostar Pen Inject 25 Units into the skin daily. 12/29/22   Camillia Herter, NP  insulin lispro (HUMALOG) 100 UNIT/ML KwikPen Inject 4 units three times daily with meals and sliding scale instructions three times daily. CBG 70 - 120: 0 units CBG 121 - 150: 2 units CBG 151 - 200: 3 units CBG 201 - 250: 5 units CBG 251 - 300: 8 units CBG 301 - 350: 11 units CBG 351 - 400: 15 units 12/29/22   Camillia Herter, NP  Insulin Pen Needle (PEN NEEDLES) 31G X 8 MM MISC USE AS DIRECTED 12/29/22   Camillia Herter, NP  ipratropium-albuterol (DUONEB) 0.5-2.5 (3) MG/3ML SOLN Take 3 mLs by nebulization every 6 (six) hours as needed (wheezing).    [provider]  metFORMIN (GLUCOPHAGE) 1000 MG tablet Take 1 tablet (1,000 mg total) by mouth 2 (two) times daily with a meal. 12/29/22 03/29/23  Camillia Herter, NP  nitrofurantoin, macrocrystal-monohydrate, (MACROBID) 100 MG capsule Take 1 capsule (100 mg total) by mouth 2 (two) times daily. 06/29/22   Brunetta Jeans, PA-C  nystatin cream (MYCOSTATIN) Apply topically 2 (two) times daily as needed (apply to rash). 06/09/21   Geradine Girt, DO  ondansetron (ZOFRAN-ODT) 4 MG disintegrating tablet Place 1 tab under the tongue every 4 hours as needed for nausea and vomitting 10/22/21   Milton Ferguson, MD  OneTouch Delica Lancets 99991111 MISC Use to check blood sugar TID. 12/10/20   Charlott Rakes, MD  oseltamivir (TAMIFLU) 75 MG  capsule Take 1 capsule (75 mg total) by mouth every 12 (twelve) hours. 09/05/21   Francene Finders, PA-C  oxyCODONE-acetaminophen (PERCOCET) 5-325 MG tablet Take 1 tablet by mouth every 6 (six) hours as needed. 10/22/21   Milton Ferguson, MD  sertraline (ZOLOFT) 100 MG tablet TAKE 1 TABLET (100 MG TOTAL) BY MOUTH DAILY. 05/25/21 05/28/22  Gildardo Pounds, NP  SUMAtriptan (IMITREX) 25 MG tablet Take 25 mg (1 tablet) total by mouth at the start of the headache. May repeat in 2 hours x 1 if headache persists. Max of 2 tabs/24 hours 08/18/21   Camillia Herter, NP  tamsulosin (FLOMAX) 0.4 MG CAPS capsule Take 1 capsule (0.4 mg total) by mouth daily. 08/21/22   Milton Ferguson, MD  topiramate (  TOPAMAX) 50 MG tablet Take 1 tablet (50 mg total) by mouth daily as needed (migraine). 06/09/21 06/09/22  Geradine Girt, DO  metoCLOPramide (REGLAN) 10 MG tablet Take 1 tablet (10 mg total) by mouth every 8 (eight) hours as needed for nausea or vomiting (headache). Patient not taking: Reported on 07/14/2020 07/07/20 07/17/20  Petrucelli, Glynda Jaeger, PA-C  traZODone (DESYREL) 50 MG tablet Take 0.5-1 tablets (25-50 mg total) by mouth at bedtime as needed for sleep. 06/04/19 07/13/19  Fulp, Ander Gaster, MD    Family History Family History  Problem Relation Age of Onset   Hypertension Mother    CAD Mother 39   Diabetes Mother    Hypertension Father    CAD Father 45       CABG    Social History Social History   Tobacco Use   Smoking status: Some Days    Packs/day: .1    Types: Cigarettes    Passive exposure: Current   Smokeless tobacco: Never  Vaping Use   Vaping Use: Never used  Substance Use Topics   Alcohol use: No   Drug use: No     Allergies   Ciprofloxacin, Lyrica cr [pregabalin er], Doxycycline, Flecainide, Latex, and Penicillin g   Review of Systems Review of Systems Per HPI  Physical Exam Triage Vital Signs ED Triage Vitals  Enc Vitals Group     BP 01/05/23 1458 (!) 153/87     Pulse Rate  01/05/23 1458 94     Resp 01/05/23 1458 18     Temp 01/05/23 1458 98.9 F (37.2 C)     Temp Source 01/05/23 1458 Oral     SpO2 01/05/23 1458 97 %     Weight --      Height --      Head Circumference --      Peak Flow --      Pain Score 01/05/23 1459 5     Pain Loc --      Pain Edu? --      Excl. in [place]? --    No data found.  Updated Vital Signs BP (!) 153/87 (BP Location: Right Arm)   Pulse 94   Temp 98.9 F (37.2 C) (Oral)   Resp 18   SpO2 97%   Visual Acuity Right Eye Distance:   Left Eye Distance:   Bilateral Distance:    Right Eye Near:   Left Eye Near:    Bilateral Near:     Physical Exam Constitutional:      General: She is not in acute distress.    Appearance: Normal appearance. She is not toxic-appearing or diaphoretic.  HENT:     Head: Normocephalic and atraumatic.      Comments: Patient has an area of fluctuance that is flesh-colored present to right occipital portion of head that extends slightly into the neck.  Tenderness to palpation as well. Eyes:     Extraocular Movements: Extraocular movements intact.     Conjunctiva/sclera: Conjunctivae normal.  Pulmonary:     Effort: Pulmonary effort is normal.  Neurological:     General: No focal deficit present.     Mental Status: She is alert and oriented to person, place, and time. Mental status is at baseline.     Cranial Nerves: Cranial nerves 2-12 are intact.     Sensory: Sensation is intact.     Motor: Motor function is intact.     Coordination: Coordination is intact.     Gait: Gait is  intact.  Psychiatric:        Mood and Affect: Mood normal.        Behavior: Behavior normal.        Thought Content: Thought content normal.        Judgment: Judgment normal.      UC Treatments / Results  Labs (all labs ordered are listed, but only abnormal results are displayed) Labs Reviewed - No data to display  EKG   Radiology No results found.  Procedures Procedures (including critical care  time)  Medications Ordered in UC Medications - No data to display  Initial Impression / Assessment and Plan / UC Course  I have reviewed the triage vital signs and the nursing notes.  Pertinent labs & imaging results that were available during my care of the patient were reviewed by me and considered in my medical decision making (see chart for details).     Patient has an area of fluctuance present to the occipital portion of the head with associated headache and blurry vision.  It does not appear consistent with abscess and I am concerned that patient needs imaging of the head which cannot be provided here at urgent care.  Patient was advised that she needs to go the ER for further evaluation and management.  Vital signs and neuroexam stable at discharge.  Agree with patient self transport as patient feels comfortable with this as well. Final Clinical Impressions(s) / UC Diagnoses   Final diagnoses:  Acute nonintractable headache, unspecified headache type  Blurred vision  Local superficial swelling of head     Discharge Instructions      Please go to the ER as soon as you leave urgent care for further evaluation and management.     ED Prescriptions   None    PDMP not reviewed this encounter.   Teodora Medici, [place] 01/05/23 [phone]

## 2023-01-05 NOTE — ED Triage Notes (Signed)
Pt c/o swelling to the back of her head causing a headache. Denies injury. Says it feels "squishy." Associated blurry vision. Onset ~ 1 week ago.

## 2023-01-05 NOTE — ED Notes (Signed)
Patient is being discharged from the Urgent Care and sent to the Emergency Department via self . Per haley, patient is in need of higher level of care due to head edema. Patient is aware and verbalizes understanding of plan of care.  Vitals:   01/05/23 1458  BP: (!) 153/87  Pulse: 94  Resp: 18  Temp: 98.9 F (37.2 C)  SpO2: 97%

## 2023-01-05 NOTE — ED Notes (Signed)
Discharge instructions reviewed with patient. Patient verbalizes understanding, no further questions at this time. Medications and follow up information provided. No acute distress noted at time of departure.  

## 2023-01-05 NOTE — ED Triage Notes (Signed)
Pt w/ HA and swelling to posterior head; sts she noticed it months ago, but it is worse now

## 2023-01-05 NOTE — ED Provider Notes (Signed)
[facility] Provider Note   CSN: [id number] Arrival date & time: 01/05/23  1526     History  Chief Complaint  Patient presents with   Headache    Michelle Gibson is a 47 y.o. female.   Headache Associated symptoms: neck pain      47 year old female presenting to the emergency department from urgent care with concern for headache and a mass in the back of her head/neck.  The patient states that she noticed the mass several months ago but it worsened over the past week.  She has had a headache for the past week and a half with associated blurry vision.  She endorses swelling to the back of her head/neck, no surrounding warmth or redness, no fevers.  No nausea or vomiting, no dizziness.  She denies any recent falls or trauma.  She endorses generalized bodyaches in the setting of her headache.  No focal weakness, numbness, loss of bowel or bladder function, saddle anesthesia. She endorses pain in her occiput that radiates out to her temples.   Home Medications Prior to Admission medications   Medication Sig Start Date End Date Taking? Authorizing Provider  atorvastatin (LIPITOR) 20 MG tablet Take 1 tablet (20 mg total) by mouth daily. 05/25/21   Gildardo Pounds, NP  benzonatate (TESSALON) 100 MG capsule Take 1 capsule (100 mg total) by mouth every 8 (eight) hours as needed for cough. 08/15/22   Teodora Medici, FNP  benzonatate (TESSALON) 100 MG capsule Take 1 capsule (100 mg total) by mouth every 8 (eight) hours. 09/15/22     Blood Glucose Monitoring Suppl (ONETOUCH VERIO) w/Device KIT Use to check blood sugar TID. 12/10/20   Charlott Rakes, MD  Continuous Blood Gluc Receiver (DEXCOM G6 RECEIVER) DEVI 1 each by Does not apply route 4 (four) times daily -  before meals and at bedtime. 06/19/21   Camillia Herter, NP  Continuous Blood Gluc Sensor (DEXCOM G6 SENSOR) MISC 1 each by Does not apply route 4 (four) times daily -  before meals and at  bedtime. 06/19/21   Camillia Herter, NP  Continuous Blood Gluc Transmit (DEXCOM G6 TRANSMITTER) MISC 1 each by Does not apply route 4 (four) times daily -  before meals and at bedtime. 06/19/21   Camillia Herter, NP  fluconazole (DIFLUCAN) 150 MG tablet Take 1 tablet by mouth once. Repeat in 3 days if needed. 06/29/22   Brunetta Jeans, PA-C  fluticasone (FLONASE) 50 MCG/ACT nasal spray Place 1 spray into both nostrils daily. 08/15/22   Teodora Medici, FNP  fluticasone (FLONASE) 50 MCG/ACT nasal spray Place 1 spray into both nostrils daily. 09/15/22     gabapentin (NEURONTIN) 300 MG capsule Take 2 capsules (600 mg total) by mouth 4 (four) times daily. 12/22/22 01/28/23  Camillia Herter, NP  hydrOXYzine (VISTARIL) 25 MG capsule Take 1 capsule (25 mg total) by mouth every 8 (eight) hours as needed. 06/08/21   Gildardo Pounds, NP  insulin glargine (LANTUS) 100 UNIT/ML Solostar Pen Inject 25 Units into the skin daily. 12/29/22   Camillia Herter, NP  insulin lispro (HUMALOG) 100 UNIT/ML KwikPen Inject 4 units three times daily with meals and sliding scale instructions three times daily. CBG 70 - 120: 0 units CBG 121 - 150: 2 units CBG 151 - 200: 3 units CBG 201 - 250: 5 units CBG 251 - 300: 8 units CBG 301 - 350: 11 units CBG 351 -  400: 15 units 12/29/22   Camillia Herter, NP  Insulin Pen Needle (PEN NEEDLES) 31G X 8 MM MISC USE AS DIRECTED 12/29/22   Camillia Herter, NP  ipratropium-albuterol (DUONEB) 0.5-2.5 (3) MG/3ML SOLN Take 3 mLs by nebulization every 6 (six) hours as needed (wheezing).    [provider]  metFORMIN (GLUCOPHAGE) 1000 MG tablet Take 1 tablet (1,000 mg total) by mouth 2 (two) times daily with a meal. 12/29/22 03/29/23  Camillia Herter, NP  nitrofurantoin, macrocrystal-monohydrate, (MACROBID) 100 MG capsule Take 1 capsule (100 mg total) by mouth 2 (two) times daily. 06/29/22   Brunetta Jeans, PA-C  nystatin cream (MYCOSTATIN) Apply topically 2 (two) times daily as needed (apply to rash).  06/09/21   Geradine Girt, DO  ondansetron (ZOFRAN-ODT) 4 MG disintegrating tablet Place 1 tab under the tongue every 4 hours as needed for nausea and vomitting 10/22/21   Milton Ferguson, MD  OneTouch Delica Lancets 99991111 MISC Use to check blood sugar TID. 12/10/20   Charlott Rakes, MD  oseltamivir (TAMIFLU) 75 MG capsule Take 1 capsule (75 mg total) by mouth every 12 (twelve) hours. 09/05/21   Francene Finders, PA-C  oxyCODONE-acetaminophen (PERCOCET) 5-325 MG tablet Take 1 tablet by mouth every 6 (six) hours as needed. 10/22/21   Milton Ferguson, MD  sertraline (ZOLOFT) 100 MG tablet TAKE 1 TABLET (100 MG TOTAL) BY MOUTH DAILY. 05/25/21 05/28/22  Gildardo Pounds, NP  SUMAtriptan (IMITREX) 25 MG tablet Take 25 mg (1 tablet) total by mouth at the start of the headache. May repeat in 2 hours x 1 if headache persists. Max of 2 tabs/24 hours 08/18/21   Camillia Herter, NP  tamsulosin (FLOMAX) 0.4 MG CAPS capsule Take 1 capsule (0.4 mg total) by mouth daily. 08/21/22   Milton Ferguson, MD  topiramate (TOPAMAX) 50 MG tablet Take 1 tablet (50 mg total) by mouth daily as needed (migraine). 06/09/21 06/09/22  Geradine Girt, DO  metoCLOPramide (REGLAN) 10 MG tablet Take 1 tablet (10 mg total) by mouth every 8 (eight) hours as needed for nausea or vomiting (headache). Patient not taking: Reported on 07/14/2020 07/07/20 07/17/20  Petrucelli, Glynda Jaeger, PA-C  traZODone (DESYREL) 50 MG tablet Take 0.5-1 tablets (25-50 mg total) by mouth at bedtime as needed for sleep. 06/04/19 07/13/19  Fulp, Ander Gaster, MD      Allergies    Ciprofloxacin, Lyrica cr [pregabalin er], Doxycycline, Flecainide, Latex, and Penicillin g    Review of Systems   Review of Systems  Musculoskeletal:  Positive for neck pain.  Neurological:  Positive for headaches.  All other systems reviewed and are negative.   Physical Exam Updated Vital Signs BP (!) 141/96 (BP Location: Right Arm)   Pulse 100   Temp 99 F (37.2 C) (Oral)   Resp 17   Ht 5'  4.5" (1.638 m)   Wt 81.6 kg   SpO2 99%   BMI 30.42 kg/m  Physical Exam Vitals and nursing note reviewed.  Constitutional:      General: She is not in acute distress. HENT:     Head: Normocephalic and atraumatic.  Eyes:     Conjunctiva/sclera: Conjunctivae normal.     Pupils: Pupils are equal, round, and reactive to light.  Neck:     Comments: No significant mass palpated, no surrounding erythema, non-tender, no fluctuance.  Cardiovascular:     Rate and Rhythm: Normal rate and regular rhythm.  Pulmonary:     Effort: Pulmonary effort is  normal. No respiratory distress.  Abdominal:     General: There is no distension.     Tenderness: There is no guarding.  Musculoskeletal:        General: No deformity or signs of injury.     Cervical back: Neck supple.  Skin:    Findings: No lesion or rash.  Neurological:     General: No focal deficit present.     Mental Status: She is alert. Mental status is at baseline.     Comments: MENTAL STATUS EXAM:    Orientation: Alert and oriented to person, place and time.  Memory: Cooperative, follows commands well.  Language: Speech is clear and language is normal.   CRANIAL NERVES:    CN 2 (Optic): Visual fields intact to confrontation.  CN 3,4,6 (EOM): Pupils equal and reactive to light. Full extraocular eye movement without nystagmus.  CN 5 (Trigeminal): Facial sensation is normal, no weakness of masticatory muscles.  CN 7 (Facial): No facial weakness or asymmetry.  CN 8 (Auditory): Auditory acuity grossly normal.  CN 9,10 (Glossophar): The uvula is midline, the palate elevates symmetrically.  CN 11 (spinal access): Normal sternocleidomastoid and trapezius strength.  CN 12 (Hypoglossal): The tongue is midline. No atrophy or fasciculations.Marland Kitchen   MOTOR:  Muscle Strength: 5/5RUE, 5/5LUE, 5/5RLE, 5/5LLE.   COORDINATION:   No tremor.   SENSATION:   Intact to light touch all four extremities.  GAIT: Gait normal without ataxia      ED  Results / Procedures / Treatments   Labs (all labs ordered are listed, but only abnormal results are displayed) Labs Reviewed  BASIC METABOLIC PANEL - Abnormal; Notable for the following components:      Result Value   Sodium 134 (*)    Glucose, Bld 139 (*)    All other components within normal limits  SARS CORONAVIRUS 2 BY RT PCR  CBC WITH DIFFERENTIAL/PLATELET  PREGNANCY, URINE    EKG None  Radiology CT HEAD WO CONTRAST  Result Date: 01/05/2023 CLINICAL DATA:  Headache, sudden, severe; Neck pain, infection suspected, no prior imaging EXAM: CT HEAD WITHOUT CONTRAST CT CERVICAL SPINE WITHOUT CONTRAST TECHNIQUE: Multidetector CT imaging of the head and cervical spine was performed following the standard protocol without intravenous contrast. Multiplanar CT image reconstructions of the cervical spine were also generated. RADIATION DOSE REDUCTION: This exam was performed according to the departmental dose-optimization program which includes automated exposure control, adjustment of the mA and/or kV according to patient size and/or use of iterative reconstruction technique. COMPARISON:  CT head 07/02/2020 FINDINGS: CT HEAD FINDINGS Brain: No evidence of large-territorial acute infarction. No parenchymal hemorrhage. No mass lesion. No extra-axial collection. No mass effect or midline shift. No hydrocephalus. Basilar cisterns are patent. Vascular: No hyperdense vessel. Skull: No acute fracture or focal lesion. Sinuses/Orbits: Paranasal sinuses and mastoid air cells are clear. The orbits are unremarkable. Other: None. CT CERVICAL SPINE FINDINGS Alignment: Reversal of the normal cervical lordosis centered At the C4-C5 level likely due to positioning and degenerative changes. Skull base and vertebrae: No acute fracture. No aggressive appearing focal osseous lesion or focal pathologic process. Soft tissues and spinal canal: No prevertebral fluid or swelling. No visible canal hematoma. Upper chest:  Unremarkable. Other: None. IMPRESSION: 1. No acute intracranial abnormality. 2. No acute displaced fracture or traumatic listhesis of the cervical spine. Electronically Signed   By: Iven Finn M.D.   On: 01/05/2023 17:12   CT CERVICAL SPINE W WO CONTRAST  Result Date: 01/05/2023 CLINICAL DATA:  Headache, sudden, severe; Neck pain, infection suspected, no prior imaging EXAM: CT HEAD WITHOUT CONTRAST CT CERVICAL SPINE WITHOUT CONTRAST TECHNIQUE: Multidetector CT imaging of the head and cervical spine was performed following the standard protocol without intravenous contrast. Multiplanar CT image reconstructions of the cervical spine were also generated. RADIATION DOSE REDUCTION: This exam was performed according to the departmental dose-optimization program which includes automated exposure control, adjustment of the mA and/or kV according to patient size and/or use of iterative reconstruction technique. COMPARISON:  CT head 07/02/2020 FINDINGS: CT HEAD FINDINGS Brain: No evidence of large-territorial acute infarction. No parenchymal hemorrhage. No mass lesion. No extra-axial collection. No mass effect or midline shift. No hydrocephalus. Basilar cisterns are patent. Vascular: No hyperdense vessel. Skull: No acute fracture or focal lesion. Sinuses/Orbits: Paranasal sinuses and mastoid air cells are clear. The orbits are unremarkable. Other: None. CT CERVICAL SPINE FINDINGS Alignment: Reversal of the normal cervical lordosis centered At the C4-C5 level likely due to positioning and degenerative changes. Skull base and vertebrae: No acute fracture. No aggressive appearing focal osseous lesion or focal pathologic process. Soft tissues and spinal canal: No prevertebral fluid or swelling. No visible canal hematoma. Upper chest: Unremarkable. Other: None. IMPRESSION: 1. No acute intracranial abnormality. 2. No acute displaced fracture or traumatic listhesis of the cervical spine. Electronically Signed   By: Iven Finn M.D.   On: 01/05/2023 17:12    Procedures .Nerve Block  Date/Time: 01/05/2023 6:25 PM  Performed by: Regan Lemming, MD Authorized by: Regan Lemming, MD   Consent:    Consent obtained:  Verbal   Consent given by:  Patient   Risks discussed:  Allergic reaction, infection and unsuccessful block Universal protocol:    Patient identity confirmed:  Verbally with patient Indications:    Indications:  Pain relief Location:    Body area:  Head   Head nerve:  Occipital   Laterality:  Left Pre-procedure details:    Skin preparation:  Chlorhexidine   Preparation: Patient was prepped and draped in usual sterile fashion   Procedure details:    Block needle gauge:  25 G   Anesthetic injected:  Bupivacaine 0.5% w/o epi   Steroid injected:  None   Additive injected:  None   Injection procedure:  Anatomic landmarks identified, incremental injection, negative aspiration for blood, introduced needle and anatomic landmarks palpated   Paresthesia:  None Post-procedure details:    Dressing:  None   Outcome:  Pain relieved   Procedure completion:  Tolerated     Medications Ordered in ED Medications  sodium chloride 0.9 % bolus 1,000 mL (1,000 mLs Intravenous New Bag/Given 01/05/23 1728)    And  0.9 %  sodium chloride infusion (has no administration in time range)  bupivacaine (MARCAINE) 0.5 % (with pres) injection 10 mL (has no administration in time range)  metoCLOPramide (REGLAN) injection 10 mg (10 mg Intravenous Given 01/05/23 1616)  diphenhydrAMINE (BENADRYL) injection 12.5 mg (12.5 mg Intravenous Given 01/05/23 1617)  iohexol (OMNIPAQUE) 300 MG/ML solution 75 mL (75 mLs Intravenous Contrast Given 01/05/23 1649)    ED Course/ Medical Decision Making/ A&P                             Medical Decision Making Amount and/or Complexity of Data Reviewed Labs: ordered. Radiology: ordered.  Risk Prescription drug management.      47 year old female presenting to the emergency  department from urgent care with concern for headache and  a mass in the back of her head/neck.  The patient states that she noticed the mass several months ago but it worsened over the past week.  She has had a headache for the past week and a half with associated blurry vision.  She endorses swelling to the back of her head/neck, no surrounding warmth or redness, no fevers.  No nausea or vomiting, no dizziness.  She denies any recent falls or trauma.  She endorses generalized bodyaches in the setting of her headache.  No focal weakness, numbness, loss of bowel or bladder function, saddle anesthesia. She endorses pain in her occiput that radiates out to her temples.   On arrival, the patient was afebrile, borderline tachycardic heart rate 100, RR 17, BP 141/96, saturating 99% on room air.  Physical exam generally unremarkable, no palpated mass, no fluctuance, no evidence of cellulitis.  Three ER providers examined the patient's head and neck with no mass or obvious area of fluctuance as had been reported in urgent care, no erythema, no concern for abscess or cellulitis.  CT Head and Cervical Spine: IMPRESSION:  1. No acute intracranial abnormality.  2. No acute displaced fracture or traumatic listhesis of the  cervical spine.   Patient symptoms are consistent with potential occipital neuralgia versus migraine headache.  An occipital nerve block was performed on the left which resulted in complete resolution of the patient's symptoms.  Her symptoms also improved following a migraine cocktail however the occipital nerve block completely resolved her pain.  Symptoms are consistent with likely occipital neuralgia.  Return precautions provided in the event of development of infectious signs or symptoms however no evidence of cellulitis or abscess today.  Overall at this time, the patient is stable for discharge with outpatient PCP follow-up.   Final Clinical Impression(s) / ED Diagnoses Final diagnoses:   Occipital neuralgia, unspecified laterality  Acute nonintractable headache, unspecified headache type    Rx / DC Orders ED Discharge Orders     None         Regan Lemming, MD 01/05/23 1827

## 2023-01-05 NOTE — Discharge Instructions (Signed)
Please go to the ER as soon as you leave urgent care for further evaluation and management.  

## 2023-01-10 DIAGNOSIS — Z419 Encounter for procedure for purposes other than remedying health state, unspecified: Secondary | ICD-10-CM | POA: Diagnosis not present

## 2023-01-13 ENCOUNTER — Other Ambulatory Visit (INDEPENDENT_AMBULATORY_CARE_PROVIDER_SITE_OTHER): Payer: Medicaid Other

## 2023-01-13 ENCOUNTER — Ambulatory Visit (INDEPENDENT_AMBULATORY_CARE_PROVIDER_SITE_OTHER): Payer: Medicaid Other | Admitting: Orthopaedic Surgery

## 2023-01-13 ENCOUNTER — Other Ambulatory Visit: Payer: Self-pay

## 2023-01-13 DIAGNOSIS — M722 Plantar fascial fibromatosis: Secondary | ICD-10-CM | POA: Diagnosis not present

## 2023-01-13 DIAGNOSIS — M25562 Pain in left knee: Secondary | ICD-10-CM | POA: Diagnosis not present

## 2023-01-13 DIAGNOSIS — M25561 Pain in right knee: Secondary | ICD-10-CM

## 2023-01-13 MED ORDER — TRAMADOL HCL 50 MG PO TABS
50.0000 mg | ORAL_TABLET | Freq: Three times a day (TID) | ORAL | 2 refills | Status: DC | PRN
  Filled 2023-01-13: qty 15, 5d supply, fill #0
  Filled 2023-04-18: qty 15, 5d supply, fill #1
  Filled 2023-06-06: qty 15, 5d supply, fill #2

## 2023-01-13 NOTE — Progress Notes (Signed)
Office Visit Note   Patient: Michelle Gibson           Date of Birth: [birth year]           MRN: [id number] Visit Date: 01/13/2023              Requested by: Camillia Herter, NP [address] [place],  [place] [address] PCP: Camillia Herter, NP   Assessment & Plan: Visit Diagnoses:  1. Acute bilateral knee pain   2. Plantar fasciitis of right foot     Plan: Impression is chronic right knee pain and instability from underlying ACL tear, left knee pain likely from compensation from right knee pain and right heel plantar fasciitis.  In regards to the right knee, I feel it is appropriate to repeat her MRI given the last MRI was from over 2 years ago.  This will be for surgical planning.  She will follow-up with [place] once this is been completed.  In regards to the heel, we have provided her with Achilles stretching.  Have also recommended appropriate shoewear and orthotics.  Follow-up as needed for this.  Follow-Up Instructions: No follow-ups on file.   Orders:  Orders Placed This Encounter  Procedures   XR KNEE 3 VIEW RIGHT   XR KNEE 3 VIEW LEFT   MR Knee Right w/o contrast   Meds ordered this encounter  Medications   traMADol (ULTRAM) 50 MG tablet    Sig: Take 1 tablet (50 mg total) by mouth 3 (three) times daily as needed.    Dispense:  30 tablet    Refill:  2      Procedures: No procedures performed   Clinical Data: No additional findings.   Subjective: Chief Complaint  Patient presents with   Right Knee - Pain   Left Knee - Pain    HPI patient is a pleasant 47 year old female who comes in today with bilateral knee pain right greater than left.  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  ACL reconstruction was discussed but at that time she did not have insurance.  She has continued to have global pain to the right knee with associated instability, locking, catching and swelling.  Symptoms are worse when she is up on her  feet.  She has been taking NSAIDs and Tylenol as well as been elevating her leg without significant relief.  She has also recently noticed left knee pain.  No injury there.  Feels as though maybe this is due to favoring from the right knee.  Other issue she brings up today is right heel pain.  Pain is worse first thing in the morning as well as the end of the day after she has been on her feet working.  She has not tried orthotics or stretching.  Review of Systems as detailed in HPI.  All others reviewed and are negative.   Objective: Vital Signs: There were no vitals taken for this visit.  Physical Exam well-developed well-nourished female no acute distress.  Alert and oriented x 3.  Ortho Exam right knee exam reveals range of motion from 0 to 120 degrees.  She does have medial and lateral joint line tenderness.  Mild patellofemoral crepitus.  She does have laxity with valgus stress but no pain.  She has laxity with anterior drawer.  Left knee range of motion 0 to 120 degrees.  No joint line tenderness.  Ligaments are stable.  She is neurovascular intact distally.  Right foot exam reveals marked tenderness over the plantar fascia insertion at the heel.  Dorsiflexion to neutral.  She is neurovascular intact distally.  Specialty Comments:  No specialty comments available.  Imaging: XR KNEE 3 VIEW RIGHT  Result Date: 01/13/2023 Mild to moderate joint space narrowing medial compartment  XR KNEE 3 VIEW LEFT  Result Date: 01/13/2023 Mild to moderate joint space narrowing medial compartment    PMFS History: Patient Active Problem List   Diagnosis Date Noted   Migraine 08/18/2021   Diabetes mellitus with hyperglycemia 06/08/2021   Uncontrolled type 2 diabetes mellitus with hyperglycemia 06/08/2021   Nausea 06/08/2021   Hyperlipidemia 06/08/2021   History of COVID-19 03/19/2021   Tachycardia 03/19/2021   Palpitations 03/19/2021   Cough 03/19/2021   Complete tear of right ACL, initial  encounter 12/10/2020   Acute tear lateral meniscus, right, initial encounter 08/29/2019   Acute medial meniscus tear of right knee 08/22/2019   Fever and chills 11/17/2018   Closed fracture of upper end of fibula 09/21/2018   WPW (Wolff-Parkinson-White syndrome) 10/10/2017   Asthma with acute exacerbation 09/15/2016   Hyperglycemia 09/15/2016   History of Wolff-Parkinson-White (WPW) syndrome 09/15/2016   Acute respiratory failure with hypoxia 09/15/2016   Bronchospasm 09/11/2016   Past Medical History:  Diagnosis Date   Acute tear lateral meniscus, right, initial encounter 08/29/2019   Asthma    Closed fracture of upper end of fibula 09/21/2018   Diabetes mellitus without complication ([place])    TYPE II   Hyperglycemia 09/15/2016   Migraines    WPW (Wolff-Parkinson-White syndrome)    s/p ablation at age 48    Family History  Problem Relation Age of Onset   Hypertension Mother    CAD Mother 8   Diabetes Mother    Hypertension Father    CAD Father 29       CABG    Past Surgical History:  Procedure Laterality Date   ABDOMINAL HYSTERECTOMY     CARDIAC SURGERY     CESAREAN SECTION     KNEE ARTHROSCOPY WITH LATERAL MENISECTOMY Right 08/29/2019   Procedure: PARTIAL LATERAL MENISECTOMY;  Surgeon: Leandrew Koyanagi, MD;  Location: [facility];  Service: Orthopedics;  Laterality: Right;   KNEE ARTHROSCOPY WITH MEDIAL MENISECTOMY Right 08/29/2019   Procedure: RIGHT KNEE ARTHROSCOPY WITH PARTIAL MEDIAL MENISECTOMY;  Surgeon: Leandrew Koyanagi, MD;  Location: [facility];  Service: Orthopedics;  Laterality: Right;   Social History   Occupational History   Not on file  Tobacco Use   Smoking status: Some Days    Packs/day: .1    Types: Cigarettes    Passive exposure: Current   Smokeless tobacco: Never  Vaping Use   Vaping Use: Never used  Substance and Sexual Activity   Alcohol use: No   Drug use: No   Sexual activity: Not Currently    Birth  control/protection: Surgical

## 2023-01-17 DIAGNOSIS — H5213 Myopia, bilateral: Secondary | ICD-10-CM | POA: Diagnosis not present

## 2023-01-18 NOTE — Progress Notes (Unsigned)
[facility] Neurology Division Clinic Note - Initial Visit   Date: 01/19/2023   Michelle Gibson MRN: [id number] DOB: July 30, 1976   Dear Dr Zonia Kief, Amy J, NP:  Thank you for your kind referral of Michelle Gibson for consultation of ***. Although her history is well known to you, please allow [place] to reiterate it for the purpose of our medical record. The patient was accompanied to the clinic by *** who also provides collateral information.     [name] is a 47 y.o. ***-handed female with diabetes ***, congenital left eye blindness, *** presenting for evaluation of neuropathy.   IMPRESSION/PLAN: ****  Return to clinic in ***  ------------------------------------------------------------- History of present illness: ***  Out-side paper records, electronic medical record, and images have been reviewed where available and summarized as: *** Lab Results  Component Value Date   HGBA1C 8.0 (A) 12/29/2022   No results found for: "VITAMINB12" Lab Results  Component Value Date   TSH 1.090 05/01/2021   Lab Results  Component Value Date   ESRSEDRATE 19 06/05/2019    Past Medical History:  Diagnosis Date   Acute tear lateral meniscus, right, initial encounter 08/29/2019   Asthma    Closed fracture of upper end of fibula 09/21/2018   Diabetes mellitus without complication (HCC)    TYPE II   Hyperglycemia 09/15/2016   Migraines    WPW (Wolff-Parkinson-White syndrome)    s/p ablation at age 38    Past Surgical History:  Procedure Laterality Date   ABDOMINAL HYSTERECTOMY     CARDIAC SURGERY     CESAREAN SECTION     KNEE ARTHROSCOPY WITH LATERAL MENISECTOMY Right 08/29/2019   Procedure: PARTIAL LATERAL MENISECTOMY;  Surgeon: Tarry Kos, MD;  Location: [facility];  Service: Orthopedics;  Laterality: Right;   KNEE ARTHROSCOPY WITH MEDIAL MENISECTOMY Right 08/29/2019   Procedure: RIGHT KNEE ARTHROSCOPY WITH PARTIAL MEDIAL MENISECTOMY;   Surgeon: Tarry Kos, MD;  Location: [facility];  Service: Orthopedics;  Laterality: Right;     Medications:  Outpatient Encounter Medications as of 01/19/2023  Medication Sig   atorvastatin (LIPITOR) 20 MG tablet Take 1 tablet (20 mg total) by mouth daily.   benzonatate (TESSALON) 100 MG capsule Take 1 capsule (100 mg total) by mouth every 8 (eight) hours as needed for cough.   benzonatate (TESSALON) 100 MG capsule Take 1 capsule (100 mg total) by mouth every 8 (eight) hours.   Blood Glucose Monitoring Suppl (ONETOUCH VERIO) w/Device KIT Use to check blood sugar TID.   Continuous Blood Gluc Receiver (DEXCOM G6 RECEIVER) DEVI 1 each by Does not apply route 4 (four) times daily -  before meals and at bedtime.   Continuous Blood Gluc Sensor (DEXCOM G6 SENSOR) MISC 1 each by Does not apply route 4 (four) times daily -  before meals and at bedtime.   Continuous Blood Gluc Transmit (DEXCOM G6 TRANSMITTER) MISC 1 each by Does not apply route 4 (four) times daily -  before meals and at bedtime.   fluconazole (DIFLUCAN) 150 MG tablet Take 1 tablet by mouth once. Repeat in 3 days if needed.   fluticasone (FLONASE) 50 MCG/ACT nasal spray Place 1 spray into both nostrils daily.   fluticasone (FLONASE) 50 MCG/ACT nasal spray Place 1 spray into both nostrils daily.   gabapentin (NEURONTIN) 300 MG capsule Take 2 capsules (600 mg total) by mouth 4 (four) times daily.   hydrOXYzine (VISTARIL) 25 MG capsule Take 1 capsule (  25 mg total) by mouth every 8 (eight) hours as needed.   insulin glargine (LANTUS) 100 UNIT/ML Solostar Pen Inject 25 Units into the skin daily.   insulin lispro (HUMALOG) 100 UNIT/ML KwikPen Inject 4 units three times daily with meals and sliding scale instructions three times daily. CBG 70 - 120: 0 units CBG 121 - 150: 2 units CBG 151 - 200: 3 units CBG 201 - 250: 5 units CBG 251 - 300: 8 units CBG 301 - 350: 11 units CBG 351 - 400: 15 units   Insulin Pen Needle (PEN  NEEDLES) 31G X 8 MM MISC USE AS DIRECTED   ipratropium-albuterol (DUONEB) 0.5-2.5 (3) MG/3ML SOLN Take 3 mLs by nebulization every 6 (six) hours as needed (wheezing).   metFORMIN (GLUCOPHAGE) 1000 MG tablet Take 1 tablet (1,000 mg total) by mouth 2 (two) times daily with a meal.   nitrofurantoin, macrocrystal-monohydrate, (MACROBID) 100 MG capsule Take 1 capsule (100 mg total) by mouth 2 (two) times daily.   nystatin cream (MYCOSTATIN) Apply topically 2 (two) times daily as needed (apply to rash).   ondansetron (ZOFRAN-ODT) 4 MG disintegrating tablet Place 1 tab under the tongue every 4 hours as needed for nausea and vomitting   OneTouch Delica Lancets 33G MISC Use to check blood sugar TID.   oseltamivir (TAMIFLU) 75 MG capsule Take 1 capsule (75 mg total) by mouth every 12 (twelve) hours.   oxyCODONE-acetaminophen (PERCOCET) 5-325 MG tablet Take 1 tablet by mouth every 6 (six) hours as needed.   sertraline (ZOLOFT) 100 MG tablet TAKE 1 TABLET (100 MG TOTAL) BY MOUTH DAILY.   SUMAtriptan (IMITREX) 25 MG tablet Take 25 mg (1 tablet) total by mouth at the start of the headache. May repeat in 2 hours x 1 if headache persists. Max of 2 tabs/24 hours   tamsulosin (FLOMAX) 0.4 MG CAPS capsule Take 1 capsule (0.4 mg total) by mouth daily.   topiramate (TOPAMAX) 50 MG tablet Take 1 tablet (50 mg total) by mouth daily as needed (migraine).   traMADol (ULTRAM) 50 MG tablet Take 1 tablet (50 mg total) by mouth 3 (three) times daily as needed.   [DISCONTINUED] metoCLOPramide (REGLAN) 10 MG tablet Take 1 tablet (10 mg total) by mouth every 8 (eight) hours as needed for nausea or vomiting (headache). (Patient not taking: Reported on 07/14/2020)   [DISCONTINUED] traZODone (DESYREL) 50 MG tablet Take 0.5-1 tablets (25-50 mg total) by mouth at bedtime as needed for sleep.   No facility-administered encounter medications on file as of 01/19/2023.    Allergies:  Allergies  Allergen Reactions   Ciprofloxacin Hives    Lyrica Cr [Pregabalin Er] Other (See Comments)    Suicidal ideation   Doxycycline Rash    Yeast infection   Flecainide Hives, Nausea And Vomiting and Rash   Latex Rash and Other (See Comments)    Causes blisters, also   Penicillin G Rash    Has patient had a PCN reaction causing immediate rash, facial/tongue/throat swelling, SOB or lightheadedness with hypotension: yes Has patient had a PCN reaction causing severe rash involving mucus membranes or skin necrosis: yes Has patient had a PCN reaction that required hospitalization: no Has patient had a PCN reaction occurring within the last 10 years: no If all of the above answers are "NO", then may proceed with Cephalosporin use.     Family History: Family History  Problem Relation Age of Onset   Hypertension Mother    CAD Mother 52   Diabetes Mother  Hypertension Father    CAD Father 2540       CABG    Social History: Social History   Tobacco Use   Smoking status: Some Days    Packs/day: .1    Types: Cigarettes    Passive exposure: Current   Smokeless tobacco: Never  Vaping Use   Vaping Use: Never used  Substance Use Topics   Alcohol use: No   Drug use: No   Social History   Social History Narrative   Not on file    Vital Signs:  There were no vitals taken for this visit.   General Medical Exam:  *** General:  Well appearing, comfortable.   Eyes/ENT: see cranial nerve examination.   Neck:   No carotid bruits. Respiratory:  Clear to auscultation, good air entry bilaterally.   Cardiac:  Regular rate and rhythm, no murmur.   Extremities:  No deformities, edema, or skin discoloration.  Skin:  No rashes or lesions.  Neurological Exam: MENTAL STATUS including orientation to time, place, person, recent and remote memory, attention span and concentration, language, and fund of knowledge is ***normal.  Speech is not dysarthric.  CRANIAL NERVES: II:  No visual field defects.  ***   III-IV-VI: Pupils equal round  and reactive to light.  Normal conjugate, extra-ocular eye movements in all directions of gaze.  No nystagmus.  No ptosis***.   V:  Normal facial sensation.    VII:  Normal facial symmetry and movements.   VIII:  Normal hearing and vestibular function.   IX-X:  Normal palatal movement.   XI:  Normal shoulder shrug and head rotation.   XII:  Normal tongue strength and range of motion, no deviation or fasciculation.  MOTOR:  No atrophy, fasciculations or abnormal movements.  No pronator drift.   Upper Extremity:  Right  Left  Deltoid  5/5   5/5   Biceps  5/5   5/5   Triceps  5/5   5/5   Infraspinatus 5/5  5/5  Medial pectoralis 5/5  5/5  Wrist extensors  5/5   5/5   Wrist flexors  5/5   5/5   Finger extensors  5/5   5/5   Finger flexors  5/5   5/5   Dorsal interossei  5/5   5/5   Abductor pollicis  5/5   5/5   Tone (Ashworth scale)  0  0   Lower Extremity:  Right  Left  Hip flexors  5/5   5/5   Hip extensors  5/5   5/5   Adductor 5/5  5/5  Abductor 5/5  5/5  Knee flexors  5/5   5/5   Knee extensors  5/5   5/5   Dorsiflexors  5/5   5/5   Plantarflexors  5/5   5/5   Toe extensors  5/5   5/5   Toe flexors  5/5   5/5   Tone (Ashworth scale)  0  0   MSRs:                                           Right        Left brachioradialis 2+  2+  biceps 2+  2+  triceps 2+  2+  patellar 2+  2+  ankle jerk 2+  2+  Hoffman no  no  plantar response down  down  SENSORY:  Normal and symmetric perception of light touch, pinprick, vibration, and proprioception.  Romberg's sign absent.   COORDINATION/GAIT: Normal finger-to- nose-finger***.  Intact rapid alternating movements bilaterally.  Able to rise from a chair without using arms.  Gait narrow based and stable. Tandem and stressed gait intact.    ***   Thank you for allowing me to participate in patient's care.  If I can answer any additional questions, I would be pleased to do so.    Sincerely,    Carmita Boom K. Allena Katz, DO

## 2023-01-19 ENCOUNTER — Other Ambulatory Visit: Payer: Self-pay | Admitting: Family

## 2023-01-19 ENCOUNTER — Telehealth: Payer: Self-pay

## 2023-01-19 ENCOUNTER — Ambulatory Visit: Payer: Medicaid Other | Admitting: Neurology

## 2023-01-19 ENCOUNTER — Encounter: Payer: Self-pay | Admitting: Neurology

## 2023-01-19 VITALS — BP 117/82 | HR 55 | Ht 64.5 in | Wt 187.0 lb

## 2023-01-19 DIAGNOSIS — R52 Pain, unspecified: Secondary | ICD-10-CM | POA: Diagnosis not present

## 2023-01-19 DIAGNOSIS — R202 Paresthesia of skin: Secondary | ICD-10-CM | POA: Diagnosis not present

## 2023-01-19 DIAGNOSIS — M791 Myalgia, unspecified site: Secondary | ICD-10-CM

## 2023-01-19 DIAGNOSIS — M2550 Pain in unspecified joint: Secondary | ICD-10-CM

## 2023-01-19 DIAGNOSIS — G894 Chronic pain syndrome: Secondary | ICD-10-CM

## 2023-01-19 NOTE — Telephone Encounter (Signed)
Whole body pain is not consistent with neuropathy and she may have chronic pain syndrome such as fibromyalgia. She endorses myalgias and polyarthralgia. Recommend she follow-up with PCP and discuss whether to see rheumatology.  Further recommendations pending results.   Pt request referral was seen by neurology today in regards to pain.

## 2023-01-19 NOTE — Telephone Encounter (Signed)
Copied from CRM [id number]. Topic: Referral - Request for Referral >> Jan 19, 2023  9:24 AM Clide Dales wrote: Has patient seen PCP for this complaint? Yes.   *If NO, is insurance requiring patient see PCP for this issue before PCP can refer them? Referral for which specialty: Rheumatology Preferred provider/office: Any office that accepts medicaid Reason for referral: Neuropathy

## 2023-01-19 NOTE — Telephone Encounter (Signed)
Complete

## 2023-01-19 NOTE — Patient Instructions (Signed)
Nerve testing of the right arm and leg  ELECTROMYOGRAM AND NERVE CONDUCTION STUDIES (EMG/NCS) INSTRUCTIONS  How to Prepare The neurologist conducting the EMG will need to know if you have certain medical conditions. Tell the neurologist and other EMG lab personnel if you: Have a pacemaker or any other electrical medical device Take blood-thinning medications Have hemophilia, a blood-clotting disorder that causes prolonged bleeding Bathing Take a shower or bath shortly before your exam in order to remove oils from your skin. Don't apply lotions or creams before the exam.  What to Expect You'll likely be asked to change into a hospital gown for the procedure and lie down on an examination table. The following explanations can help you understand what will happen during the exam.  Electrodes. The neurologist or a technician places surface electrodes at various locations on your skin depending on where you're experiencing symptoms. Or the neurologist may insert needle electrodes at different sites depending on your symptoms.  Sensations. The electrodes will at times transmit a tiny electrical current that you may feel as a twinge or spasm. The needle electrode may cause discomfort or pain that usually ends shortly after the needle is removed. If you are concerned about discomfort or pain, you may want to talk to the neurologist about taking a short break during the exam.  Instructions. During the needle EMG, the neurologist will assess whether there is any spontaneous electrical activity when the muscle is at rest - activity that isn't present in healthy muscle tissue - and the degree of activity when you slightly contract the muscle.  He or she will give you instructions on resting and contracting a muscle at appropriate times. Depending on what muscles and nerves the neurologist is examining, he or she may ask you to change positions during the exam.  After your EMG You may experience some  temporary, minor bruising where the needle electrode was inserted into your muscle. This bruising should fade within several days. If it persists, contact your primary care doctor.   

## 2023-01-23 ENCOUNTER — Other Ambulatory Visit: Payer: Self-pay | Admitting: Family

## 2023-01-23 DIAGNOSIS — E1141 Type 2 diabetes mellitus with diabetic mononeuropathy: Secondary | ICD-10-CM

## 2023-01-25 ENCOUNTER — Other Ambulatory Visit: Payer: Self-pay

## 2023-01-25 MED ORDER — GABAPENTIN 300 MG PO CAPS
600.0000 mg | ORAL_CAPSULE | Freq: Four times a day (QID) | ORAL | 0 refills | Status: DC
Start: 2023-01-25 — End: 2023-02-25
  Filled 2023-01-25: qty 240, 30d supply, fill #0

## 2023-01-25 NOTE — Telephone Encounter (Signed)
Requested Prescriptions  Pending Prescriptions Disp Refills   gabapentin (NEURONTIN) 300 MG capsule 240 capsule 0    Sig: Take 2 capsules (600 mg total) by mouth 4 (four) times daily.     Neurology: Anticonvulsants - gabapentin Passed - 01/23/2023  3:52 PM      Passed - Cr in normal range and within 360 days    Creat  Date Value Ref Range Status  10/07/2016 1.00 0.50 - 1.10 mg/dL Final   Creatinine, Ser  Date Value Ref Range Status  01/05/2023 0.82 0.44 - 1.00 mg/dL Final         Passed - Completed PHQ-2 or PHQ-9 in the last 360 days      Passed - Valid encounter within last 12 months    Recent Outpatient Visits           3 weeks ago Neuropathy   [facility], [place], NP   1 year ago COVID-19   [facility], Gildardo Pounds, NP   1 year ago Type 2 diabetes mellitus with diabetic mononeuropathy, with long-term current use of insulin [facility])   [facility] [facility], [place], NP   1 year ago Encounter to establish care   [facility], Amy J, NP   1 year ago Type 2 diabetes mellitus with other specified complication, with long-term current use of insulin [facility])   [facility], Shea Stakes, NP

## 2023-01-27 ENCOUNTER — Other Ambulatory Visit: Payer: Self-pay

## 2023-01-27 DIAGNOSIS — H2511 Age-related nuclear cataract, right eye: Secondary | ICD-10-CM | POA: Diagnosis not present

## 2023-02-03 ENCOUNTER — Ambulatory Visit: Payer: Medicaid Other | Admitting: Neurology

## 2023-02-03 DIAGNOSIS — R52 Pain, unspecified: Secondary | ICD-10-CM | POA: Diagnosis not present

## 2023-02-03 DIAGNOSIS — R202 Paresthesia of skin: Secondary | ICD-10-CM

## 2023-02-03 NOTE — Procedures (Signed)
[facility] Neurology  [address] [place], [address]  [place], [place] [address] Tel: [phone] Fax: [phone] Test Date:  02/03/2023  Patient: Michelle Gibson DOB: 05-11-1976 Physician: Nita Sickle, DO  Sex: Female Height:  Ref Phys: Nita Sickle, DO  ID#: [id number]   Technician:    History: This is a 46 year old female referred for evaluation of generalized myalgias and paresthesias.  NCV & EMG Findings: Extensive electrodiagnostic testing of the right upper and lower extremity shows:  All sensory responses including the right median, ulnar, mixed palmer, sural, and superficial peroneal nerves are within normal limits. All motor responses including the right median, ulnar, peroneal, and tibial nerves are within normal limits. Right tibial H reflex study is within normal limits. There is no evidence of active or chronic motor axonal loss changes affecting any of the tested muscles.  Motor unit configuration and recruitment pattern is within normal limits.  Impression: This is a normal study of the right upper and lower extremities.  In particular, there is no evidence of a diffuse myopathy, cervical/lumbosacral radiculopathy, or a large fiber sensorimotor polyneuropathy.   ___________________________ Nita Sickle, DO    Nerve Conduction Studies   Stim Site NR Peak (ms) Norm Peak (ms) O-P Amp (V) Norm O-P Amp  Right Median Anti Sensory (2nd Digit)  32 C  Wrist    3.3 <3.4 32.4 >20  Right Sup Peroneal Anti Sensory (Ant Lat Mall)  32 C  12 cm    2.9 <4.5 15.4 >5  Right Sural Anti Sensory (Lat Mall)  32 C  Calf    3.2 <4.5 20.6 >5  Right Ulnar Anti Sensory (5th Digit)  32 C  Wrist    2.6 <3.1 42.9 >12     Stim Site NR Onset (ms) Norm Onset (ms) O-P Amp (mV) Norm O-P Amp Site1 Site2 Delta-0 (ms) Dist (cm) Vel (m/s) Norm Vel (m/s)  Right Median Motor (Abd Poll Brev)  32 C  Wrist    3.6 <3.9 12.6 >6 Elbow Wrist 5.3 28.0 53 >50  Elbow    8.9  12.6         Right  Peroneal Motor (Ext Dig Brev)  32 C  Ankle    4.0 <5.5 6.7 >3 B Fib Ankle 7.3 37.0 51 >40  B Fib    11.3  6.0  Poplt B Fib 1.7 8.0 47 >40  Poplt    13.0  5.8         Right Tibial Motor (Abd Hall Brev)  32 C  Ankle    3.8 <6.0 10.8 >8 Knee Ankle 8.6 40.0 47 >40  Knee    12.4  9.8         Right Ulnar Motor (Abd Dig Minimi)  32 C  Wrist    2.5 <3.1 11.8 >7 B Elbow Wrist 3.4 20.0 59 >50  B Elbow    5.9  10.5  A Elbow B Elbow 1.3 10.0 77 >50  A Elbow    7.2  9.9            Stim Site NR Peak (ms) Norm Peak (ms) P-T Amp (V) Site1 Site2 Delta-P (ms) Norm Delta (ms)  Right Median/Ulnar Palm Comparison (Wrist - 8cm)  32 C  Median Palm    1.8 <2.2 68.0 Median Palm Ulnar Palm 0.3   Ulnar Palm    1.5 <2.2 14.1       Electromyography   Side Muscle Ins.Act Fibs Fasc Recrt Amp Dur Poly  Activation Comment  Right 1stDorInt Nml Nml Nml Nml Nml Nml Nml Nml N/A  Right PronatorTeres Nml Nml Nml Nml Nml Nml Nml Nml N/A  Right Biceps Nml Nml Nml Nml Nml Nml Nml Nml N/A  Right Triceps Nml Nml Nml Nml Nml Nml Nml Nml N/A  Right Deltoid Nml Nml Nml Nml Nml Nml Nml Nml N/A  Right AntTibialis Nml Nml Nml Nml Nml Nml Nml Nml N/A  Right Gastroc Nml Nml Nml Nml Nml Nml Nml Nml N/A  Right Flex Dig Long Nml Nml Nml Nml Nml Nml Nml Nml N/A  Right RectFemoris Nml Nml Nml Nml Nml Nml Nml Nml N/A  Right GluteusMed Nml Nml Nml Nml Nml Nml Nml Nml N/A      Waveforms:

## 2023-02-04 ENCOUNTER — Other Ambulatory Visit: Payer: Self-pay

## 2023-02-07 ENCOUNTER — Ambulatory Visit: Payer: Self-pay | Admitting: *Deleted

## 2023-02-07 NOTE — Telephone Encounter (Signed)
  Chief Complaint: Diarrhea for a month. Symptoms: Everything she eats and drinks is going straight through her.  Having abd pain too. Frequency: Every time she eats.   Watery diarrhea Pertinent Negatives: Patient denies vomiting Disposition: [] ED /[] Urgent Care (no appt availability in office) / [x] Appointment(In office/virtual)/ []  [facility]/ [] Home Care/ [] Refused Recommended Disposition /[] [facility]/ []  Follow-up with PCP Additional Notes: Appt made with Ricky Stabs, NP for 02/11/2023 at 3:20.

## 2023-02-07 NOTE — Telephone Encounter (Signed)
Message from Valora Piccolo sent at 02/07/2023 12:48 PM EDT  Summary: Diarrhea for over 1 month Advice   Pt is calling to report diarrhea for over 1 month. Please advise          Call History   Type Contact Phone/Fax User  02/07/2023 12:47 PM EDT Phone (Incoming) [place], Sandrine [place] (Self) [phone] Rexene Edison) Jens Som A   Reason for Disposition  [1] MODERATE diarrhea (e.g., 4-6 times / day more than normal) AND [2] present > 48 hours (2 days)  Answer Assessment - Initial Assessment Questions 1. DIARRHEA SEVERITY: "How bad is the diarrhea?" "How many more stools have you had in the past 24 hours than normal?"    - NO DIARRHEA (SCALE 0)   - MILD (SCALE 1-3): Few loose or mushy BMs; increase of 1-3 stools over normal daily number of stools; mild increase in ostomy output.   -  MODERATE (SCALE 4-7): Increase of 4-6 stools daily over normal; moderate increase in ostomy output.   -  SEVERE (SCALE 8-10; OR "WORST POSSIBLE"): Increase of 7 or more stools daily over normal; moderate increase in ostomy output; incontinence.     Every time I eat it goes straight through me.   No antibiotics 2. ONSET: "When did the diarrhea begin?"      A month ago 3. BM CONSISTENCY: "How loose or watery is the diarrhea?"      watery 4. VOMITING: "Are you also vomiting?" If Yes, ask: "How many times in the past 24 hours?"      No 5. ABDOMEN PAIN: "Are you having any abdomen pain?" If Yes, ask: "What does it feel like?" (e.g., crampy, dull, intermittent, constant)      I have pain in my abd and goes through to my back. 6. ABDOMEN PAIN SEVERITY: If present, ask: "How bad is the pain?"  (e.g., Scale 1-10; mild, moderate, or severe)   - MILD (1-3): doesn't interfere with normal activities, abdomen soft and not tender to touch    - MODERATE (4-7): interferes with normal activities or awakens from sleep, abdomen tender to touch    - SEVERE (8-10): excruciating pain, doubled over, unable to do any normal  activities       Moderate 7. ORAL INTAKE: If vomiting, "Have you been able to drink liquids?" "How much liquids have you had in the past 24 hours?"     Everything goes straight through me.     8. HYDRATION: "Any signs of dehydration?" (e.g., dry mouth [not just dry lips], too weak to stand, dizziness, new weight loss) "When did you last urinate?"     I've been dehydrated.   My blood sugar off too 9. EXPOSURE: "Have you traveled to a foreign country recently?" "Have you been exposed to anyone with diarrhea?" "Could you have eaten any food that was spoiled?"     no 10. ANTIBIOTIC USE: "Are you taking antibiotics now or have you taken antibiotics in the past 2 months?"       No 11. OTHER SYMPTOMS: "Do you have any other symptoms?" (e.g., fever, blood in stool)       Blood sugar is off  up to 500.   They are adjusting my insulin. 12. PREGNANCY: "Is there any chance you are pregnant?" "When was your last menstrual period?"       Not asked  Protocols used: [facility]

## 2023-02-09 NOTE — Progress Notes (Signed)
Patient ID: Michelle Gibson, female    DOB: [birth year]  MRN: [id number]  CC: Diarrhea  Subjective: Michelle Gibson is a 47 y.o. female who presents for diarrhea. She is accompanied by her daughter.   Her concerns today include:  02/07/2023 per triage RN call note: Chief Complaint: Diarrhea for a month. Symptoms: Everything she eats and drinks is going straight through her.  Having abd pain too. Frequency: Every time she eats.   Watery diarrhea Pertinent Negatives: Patient denies vomiting Disposition: [] ED /[] Urgent Care (no appt availability in office) / [x] Appointment(In office/virtual)/ []  [place] [facility]/ [] Home Care/ [] Refused Recommended Disposition /[] [place] [facility]/ []  Follow-up with PCP  Message from Valora Piccolo sent at 02/07/2023 12:48 PM EDT       Summary: Diarrhea for over 1 month Advice     Pt is calling to report diarrhea for over 1 month. Please advise            Call History     Type Contact Phone/Fax User  02/07/2023 12:47 PM EDT Phone (Incoming) [place], [place] (Self) [phone] Rexene Edison) Jens Som A    Reason for Disposition  [1] MODERATE diarrhea (e.g., 4-6 times / day more than normal) AND [2] present > 48 hours (2 days)  Answer Assessment - Initial Assessment Questions 1. DIARRHEA SEVERITY: "How bad is the diarrhea?" "How many more stools have you had in the past 24 hours than normal?"    - NO DIARRHEA (SCALE 0)   - MILD (SCALE 1-3): Few loose or mushy BMs; increase of 1-3 stools over normal daily number of stools; mild increase in ostomy output.   -  MODERATE (SCALE 4-7): Increase of 4-6 stools daily over normal; moderate increase in ostomy output.   -  SEVERE (SCALE 8-10; OR "WORST POSSIBLE"): Increase of 7 or more stools daily over normal; moderate increase in ostomy output; incontinence.     Every time I eat it goes straight through me.   No antibiotics 2. ONSET: "When did the diarrhea begin?"      A month ago 3. BM  CONSISTENCY: "How loose or watery is the diarrhea?"      watery 4. VOMITING: "Are you also vomiting?" If Yes, ask: "How many times in the past 24 hours?"      No 5. ABDOMEN PAIN: "Are you having any abdomen pain?" If Yes, ask: "What does it feel like?" (e.g., crampy, dull, intermittent, constant)      I have pain in my abd and goes through to my back. 6. ABDOMEN PAIN SEVERITY: If present, ask: "How bad is the pain?"  (e.g., Scale 1-10; mild, moderate, or severe)   - MILD (1-3): doesn't interfere with normal activities, abdomen soft and not tender to touch    - MODERATE (4-7): interferes with normal activities or awakens from sleep, abdomen tender to touch    - SEVERE (8-10): excruciating pain, doubled over, unable to do any normal activities       Moderate 7. ORAL INTAKE: If vomiting, "Have you been able to drink liquids?" "How much liquids have you had in the past 24 hours?"     Everything goes straight through me.     8. HYDRATION: "Any signs of dehydration?" (e.g., dry mouth [not just dry lips], too weak to stand, dizziness, new weight loss) "When did you last urinate?"     I've been dehydrated.   My blood sugar off too 9. EXPOSURE: "Have you traveled to a  foreign country recently?" "Have you been exposed to anyone with diarrhea?" "Could you have eaten any food that was spoiled?"     no 10. ANTIBIOTIC USE: "Are you taking antibiotics now or have you taken antibiotics in the past 2 months?"       No 11. OTHER SYMPTOMS: "Do you have any other symptoms?" (e.g., fever, blood in stool)       Blood sugar is off  up to 500.   They are adjusting my insulin. 12. PREGNANCY: "Is there any chance you are pregnant?" "When was your last menstrual period?"       Not asked  Protocols used: Diarrhea-A-AH  Today's visit 02/11/2023: - Reports since triage call diarrhea resolved.  - Reports memory loss. States when she is talking mid-sentence feels like she "blacks out" and "forgets what I am saying."  Reports later she is usually able to remember what she forgot. Reports she repeats herself as well. Reports history of migraines and occipital neuralgia. Reports she was recently seen at the Emergency Department and an injection was given which helped with the same. She denies associated red flag symptoms. - States she has "rage" and anxiety depression. She is unsure of primary cause. She would like to begin medication to see if helps. She would also like a referral to Psychiatry. She denies thoughts of self-harm, suicidal ideations, homicidal ideations. - No further issues/concerns for discussion today.   Patient Active Problem List   Diagnosis Date Noted   Migraine 08/18/2021   Diabetes mellitus with hyperglycemia (HCC) 06/08/2021   Uncontrolled type 2 diabetes mellitus with hyperglycemia (HCC) 06/08/2021   Nausea 06/08/2021   Hyperlipidemia 06/08/2021   History of COVID-19 03/19/2021   Tachycardia 03/19/2021   Palpitations 03/19/2021   Cough 03/19/2021   Complete tear of right ACL, initial encounter 12/10/2020   Acute tear lateral meniscus, right, initial encounter 08/29/2019   Acute medial meniscus tear of right knee 08/22/2019   Fever and chills 11/17/2018   Closed fracture of upper end of fibula 09/21/2018   WPW (Wolff-Parkinson-White syndrome) 10/10/2017   Asthma with acute exacerbation 09/15/2016   Hyperglycemia 09/15/2016   History of Wolff-Parkinson-White (WPW) syndrome 09/15/2016   Acute respiratory failure with hypoxia (HCC) 09/15/2016   Bronchospasm 09/11/2016     Current Outpatient Medications on File Prior to Visit  Medication Sig Dispense Refill   atorvastatin (LIPITOR) 20 MG tablet Take 1 tablet (20 mg total) by mouth daily. 90 tablet 3   benzonatate (TESSALON) 100 MG capsule Take 1 capsule (100 mg total) by mouth every 8 (eight) hours as needed for cough. (Patient not taking: Reported on 01/19/2023) 21 capsule 0   benzonatate (TESSALON) 100 MG capsule Take 1 capsule  (100 mg total) by mouth every 8 (eight) hours. (Patient not taking: Reported on 01/19/2023) 21 capsule 0   Blood Glucose Monitoring Suppl (ONETOUCH VERIO) w/Device KIT Use to check blood sugar TID. 1 kit 0   Continuous Blood Gluc Receiver (DEXCOM G6 RECEIVER) DEVI 1 each by Does not apply route 4 (four) times daily -  before meals and at bedtime. 1 each 0   Continuous Blood Gluc Sensor (DEXCOM G6 SENSOR) MISC 1 each by Does not apply route 4 (four) times daily -  before meals and at bedtime. 1 each 1   Continuous Blood Gluc Transmit (DEXCOM G6 TRANSMITTER) MISC 1 each by Does not apply route 4 (four) times daily -  before meals and at bedtime. 1 each 1   fluconazole (DIFLUCAN)  150 MG tablet Take 1 tablet by mouth once. Repeat in 3 days if needed. (Patient not taking: Reported on 01/19/2023) 2 tablet 0   fluticasone (FLONASE) 50 MCG/ACT nasal spray Place 1 spray into both nostrils daily. (Patient not taking: Reported on 01/19/2023) 16 g 0   fluticasone (FLONASE) 50 MCG/ACT nasal spray Place 1 spray into both nostrils daily. (Patient not taking: Reported on 01/19/2023) 16 g 0   gabapentin (NEURONTIN) 300 MG capsule Take 2 capsules (600 mg total) by mouth 4 (four) times daily. 240 capsule 0   insulin glargine (LANTUS) 100 UNIT/ML Solostar Pen Inject 25 Units into the skin daily. 15 mL 1   insulin lispro (HUMALOG) 100 UNIT/ML KwikPen Inject 4 units three times daily with meals and sliding scale instructions three times daily. CBG 70 - 120: 0 units CBG 121 - 150: 2 units CBG 151 - 200: 3 units CBG 201 - 250: 5 units CBG 251 - 300: 8 units CBG 301 - 350: 11 units CBG 351 - 400: 15 units 15 mL 3   Insulin Pen Needle (PEN NEEDLES) 31G X 8 MM MISC USE AS DIRECTED 100 each 6   ipratropium-albuterol (DUONEB) 0.5-2.5 (3) MG/3ML SOLN Take 3 mLs by nebulization every 6 (six) hours as needed (wheezing).     metFORMIN (GLUCOPHAGE) 1000 MG tablet Take 1 tablet (1,000 mg total) by mouth 2 (two) times daily with a meal. 180  tablet 0   nitrofurantoin, macrocrystal-monohydrate, (MACROBID) 100 MG capsule Take 1 capsule (100 mg total) by mouth 2 (two) times daily. (Patient not taking: Reported on 01/19/2023) 10 capsule 0   nystatin cream (MYCOSTATIN) Apply topically 2 (two) times daily as needed (apply to rash). (Patient not taking: Reported on 01/19/2023) 30 g 0   ondansetron (ZOFRAN-ODT) 4 MG disintegrating tablet Place 1 tab under the tongue every 4 hours as needed for nausea and vomitting (Patient not taking: Reported on 01/19/2023) 12 tablet 0   OneTouch Delica Lancets 33G MISC Use to check blood sugar TID. 100 each 2   oseltamivir (TAMIFLU) 75 MG capsule Take 1 capsule (75 mg total) by mouth every 12 (twelve) hours. (Patient not taking: Reported on 01/19/2023) 10 capsule 0   oxyCODONE-acetaminophen (PERCOCET) 5-325 MG tablet Take 1 tablet by mouth every 6 (six) hours as needed. (Patient not taking: Reported on 01/19/2023) 20 tablet 0   SUMAtriptan (IMITREX) 25 MG tablet Take 25 mg (1 tablet) total by mouth at the start of the headache. May repeat in 2 hours x 1 if headache persists. Max of 2 tabs/24 hours (Patient not taking: Reported on 01/19/2023) 30 tablet 0   tamsulosin (FLOMAX) 0.4 MG CAPS capsule Take 1 capsule (0.4 mg total) by mouth daily. 10 capsule 0   topiramate (TOPAMAX) 50 MG tablet Take 1 tablet (50 mg total) by mouth daily as needed (migraine). (Patient not taking: Reported on 01/19/2023)     traMADol (ULTRAM) 50 MG tablet Take 1 tablet (50 mg total) by mouth 3 (three) times daily as needed. 30 tablet 2   [DISCONTINUED] metoCLOPramide (REGLAN) 10 MG tablet Take 1 tablet (10 mg total) by mouth every 8 (eight) hours as needed for nausea or vomiting (headache). (Patient not taking: Reported on 07/14/2020) 5 tablet 0   [DISCONTINUED] traZODone (DESYREL) 50 MG tablet Take 0.5-1 tablets (25-50 mg total) by mouth at bedtime as needed for sleep. 30 tablet 3   No current facility-administered medications on file prior to  visit.    Allergies  Allergen Reactions  Ciprofloxacin Hives   Lyrica Cr [Pregabalin Er] Other (See Comments)    Suicidal ideation   Doxycycline Rash    Yeast infection   Flecainide Hives, Nausea And Vomiting and Rash   Latex Rash and Other (See Comments)    Causes blisters, also   Penicillin G Rash    Has patient had a PCN reaction causing immediate rash, facial/tongue/throat swelling, SOB or lightheadedness with hypotension: yes Has patient had a PCN reaction causing severe rash involving mucus membranes or skin necrosis: yes Has patient had a PCN reaction that required hospitalization: no Has patient had a PCN reaction occurring within the last 10 years: no If all of the above answers are "NO", then may proceed with Cephalosporin use.     Social History   Socioeconomic History   Marital status: Divorced    Spouse name: Not on file   Number of children: Not on file   Years of education: Not on file   Highest education level: Not on file  Occupational History   Not on file  Tobacco Use   Smoking status: Some Days    Packs/day: .1    Types: Cigarettes    Passive exposure: Current   Smokeless tobacco: Never  Vaping Use   Vaping Use: Never used  Substance and Sexual Activity   Alcohol use: No   Drug use: No   Sexual activity: Not Currently    Birth control/protection: Surgical  Other Topics Concern   Not on file  Social History Narrative   Right Handed    Lives in a mobile home. Lives with daughter    Social Determinants of Health   Financial Resource Strain: Not on file  Food Insecurity: Not on file  Transportation Needs: Not on file  Physical Activity: Not on file  Stress: Not on file  Social Connections: Not on file  Intimate Partner Violence: Not on file    Family History  Problem Relation Age of Onset   Hypertension Mother    CAD Mother 50   Diabetes Mother    Hypertension Father    CAD Father 65       CABG    Past Surgical History:   Procedure Laterality Date   ABDOMINAL HYSTERECTOMY     CARDIAC SURGERY     CESAREAN SECTION     KNEE ARTHROSCOPY WITH LATERAL MENISECTOMY Right 08/29/2019   Procedure: PARTIAL LATERAL MENISECTOMY;  Surgeon: Tarry Kos, MD;  Location: [facility];  Service: Orthopedics;  Laterality: Right;   KNEE ARTHROSCOPY WITH MEDIAL MENISECTOMY Right 08/29/2019   Procedure: RIGHT KNEE ARTHROSCOPY WITH PARTIAL MEDIAL MENISECTOMY;  Surgeon: Tarry Kos, MD;  Location: [facility];  Service: Orthopedics;  Laterality: Right;    ROS: Review of Systems Negative except as stated above  PHYSICAL EXAM: BP 115/83   Pulse 96   Resp 16   Wt 186 lb 12.8 oz (84.7 kg)   SpO2 95%   BMI 31.57 kg/m   Physical Exam HENT:     Head: Normocephalic and atraumatic.     Right Ear: Tympanic membrane, ear canal and external ear normal.     Left Ear: Tympanic membrane, ear canal and external ear normal.     Nose: Nose normal.     Mouth/Throat:     Mouth: Mucous membranes are moist.     Pharynx: Oropharynx is clear.  Eyes:     Extraocular Movements: Extraocular movements intact.     Conjunctiva/sclera: Conjunctivae normal.  Pupils: Pupils are equal, round, and reactive to light.  Cardiovascular:     Rate and Rhythm: Normal rate and regular rhythm.     Pulses: Normal pulses.     Heart sounds: Normal heart sounds.  Pulmonary:     Effort: Pulmonary effort is normal.     Breath sounds: Normal breath sounds.  Musculoskeletal:     Cervical back: Normal range of motion and neck supple.  Neurological:     General: No focal deficit present.     Mental Status: She is alert and oriented to person, place, and time.  Psychiatric:        Mood and Affect: Mood normal.        Behavior: Behavior normal.     ASSESSMENT AND PLAN: 1. Diarrhea, unspecified type - Resolved.   2. Memory loss 3. History of migraine 4. Occipital neuralgia, unspecified laterality - Referral to Neurology  for further evaluation/management. During the interim follow-up with primary provider as scheduled.  - Ambulatory referral to Neurology  5. Anxiety and depression - Patient denies thoughts of self-harm, suicidal ideations, homicidal ideations. - Fluoxetine and Hydroxyzine as prescribed. Counseled on medication adherence/adverse effects.  - Referral to Psychiatry for further evaluation/management. During the interim follow-up with primary provider as scheduled.  - Ambulatory referral to Psychiatry - FLUoxetine (PROZAC) 10 MG capsule; Take 2 capsules (20 mg total) by mouth at bedtime.  Dispense: 30 capsule; Refill: 1 - hydrOXYzine (VISTARIL) 25 MG capsule; Take 1 capsule (25 mg total) by mouth every 8 (eight) hours as needed.  Dispense: 30 capsule; Refill: 1   Patient was given the opportunity to ask questions.  Patient verbalized understanding of the plan and was able to repeat key elements of the plan. Patient was given clear instructions to go to Emergency Department or return to medical center if symptoms don't improve, worsen, or new problems develop.The patient verbalized understanding.   Orders Placed This Encounter  Procedures   Ambulatory referral to Neurology   Ambulatory referral to Psychiatry     Requested Prescriptions   Signed Prescriptions Disp Refills   FLUoxetine (PROZAC) 10 MG capsule 30 capsule 1    Sig: Take 2 capsules (20 mg total) by mouth at bedtime.   hydrOXYzine (VISTARIL) 25 MG capsule 30 capsule 1    Sig: Take 1 capsule (25 mg total) by mouth every 8 (eight) hours as needed.    Follow-up with primary provider as scheduled.   Rema Fendt, NP

## 2023-02-10 ENCOUNTER — Ambulatory Visit: Payer: Self-pay

## 2023-02-10 ENCOUNTER — Encounter: Payer: Medicaid Other | Admitting: Neurology

## 2023-02-10 NOTE — Telephone Encounter (Signed)
     Chief Complaint: Main symptom is memory loss, had garbled speech 2 weeks ago. Pt. States she is mainly forgetful. Pt. States "I can't remember peoples names and if I have taken my medication." Daughter concerned about dementia. Has appointment tomorrow. Symptoms: Above Frequency: 2 weeks Pertinent Negatives: Patient denies chest pain, dizziness, headache, weakness on one side of body Disposition: [] ED /[] Urgent Care (no appt availability in office) / [] Appointment(In office/virtual)/ []  [facility]/ [] Home Care/ [] Refused Recommended Disposition /[] [place] [facility]/ [x]  Follow-up with PCP Additional Notes: Has appointment tomorrow already. Instructed to call 911 for worsening of symptoms.   Reason for Disposition  [1] Loss of speech or garbled speech AND [2] sudden onset AND [3] brief (now gone)  Answer Assessment - Initial Assessment Questions 1. SYMPTOM: "What is the main symptom you are concerned about?" (e.g., weakness, numbness)     Memory loss, slurred speech 2. ONSET: "When did this start?" (minutes, hours, days; while sleeping)     2 weeks ago 3. LAST NORMAL: "When was the last time you (the patient) were normal (no symptoms)?"     2 weeks ago 4. PATTERN "Does this come and go, or has it been constant since it started?"  "Is it present now?"     Comes and goes 5. CARDIAC SYMPTOMS: "Have you had any of the following symptoms: chest pain, difficulty breathing, palpitations?"     No 6. NEUROLOGIC SYMPTOMS: "Have you had any of the following symptoms: headache, dizziness, vision loss, double vision, changes in speech, unsteady on your feet?"     No 7. OTHER SYMPTOMS: "Do you have any other symptoms?"     No 8. PREGNANCY: "Is there any chance you are pregnant?" "When was your last menstrual period?"     No  Protocols used: Neurologic Deficit-A-AH

## 2023-02-11 ENCOUNTER — Encounter: Payer: Self-pay | Admitting: Family

## 2023-02-11 ENCOUNTER — Other Ambulatory Visit: Payer: Self-pay

## 2023-02-11 ENCOUNTER — Ambulatory Visit: Payer: Medicaid Other | Admitting: Family

## 2023-02-11 VITALS — BP 115/83 | HR 96 | Resp 16 | Wt 186.8 lb

## 2023-02-11 DIAGNOSIS — R197 Diarrhea, unspecified: Secondary | ICD-10-CM | POA: Diagnosis not present

## 2023-02-11 DIAGNOSIS — M5481 Occipital neuralgia: Secondary | ICD-10-CM | POA: Diagnosis not present

## 2023-02-11 DIAGNOSIS — F32A Depression, unspecified: Secondary | ICD-10-CM | POA: Diagnosis not present

## 2023-02-11 DIAGNOSIS — Z8669 Personal history of other diseases of the nervous system and sense organs: Secondary | ICD-10-CM

## 2023-02-11 DIAGNOSIS — F419 Anxiety disorder, unspecified: Secondary | ICD-10-CM | POA: Diagnosis not present

## 2023-02-11 DIAGNOSIS — R413 Other amnesia: Secondary | ICD-10-CM

## 2023-02-11 MED ORDER — HYDROXYZINE PAMOATE 25 MG PO CAPS
25.0000 mg | ORAL_CAPSULE | Freq: Three times a day (TID) | ORAL | 1 refills | Status: DC | PRN
  Filled 2023-02-11: qty 30, 10d supply, fill #0

## 2023-02-11 MED ORDER — FLUOXETINE HCL 10 MG PO CAPS
20.0000 mg | ORAL_CAPSULE | Freq: Every day | ORAL | 1 refills | Status: DC
  Filled 2023-02-11: qty 30, 15d supply, fill #0
  Filled 2023-04-18: qty 30, 15d supply, fill #1

## 2023-02-11 NOTE — Progress Notes (Signed)
Pt states her diarrhea has gotten better Pt states she has been having memory issues

## 2023-02-15 ENCOUNTER — Ambulatory Visit: Payer: Medicaid Other | Attending: Family | Admitting: Pharmacist

## 2023-02-15 ENCOUNTER — Other Ambulatory Visit: Payer: Self-pay

## 2023-02-15 DIAGNOSIS — E1141 Type 2 diabetes mellitus with diabetic mononeuropathy: Secondary | ICD-10-CM

## 2023-02-15 DIAGNOSIS — Z794 Long term (current) use of insulin: Secondary | ICD-10-CM

## 2023-02-15 MED ORDER — INSULIN LISPRO (1 UNIT DIAL) 100 UNIT/ML (KWIKPEN)
10.0000 [IU] | PEN_INJECTOR | Freq: Three times a day (TID) | SUBCUTANEOUS | 3 refills | Status: DC
  Filled 2023-02-15: qty 15, 50d supply, fill #0
  Filled 2023-04-18: qty 15, 50d supply, fill #1

## 2023-02-15 MED ORDER — INSULIN GLARGINE 100 UNIT/ML SOLOSTAR PEN
PEN_INJECTOR | SUBCUTANEOUS | 1 refills | Status: DC
  Filled 2023-02-15: qty 15, 37d supply, fill #0
  Filled 2023-04-18: qty 15, 37d supply, fill #1

## 2023-02-15 MED ORDER — INSULIN PEN NEEDLE 32G X 4 MM MISC
3 refills | Status: AC
  Filled 2023-02-15: qty 100, 25d supply, fill #0
  Filled 2023-04-18: qty 100, 25d supply, fill #1

## 2023-02-15 MED ORDER — FREESTYLE LIBRE 2 READER DEVI
0 refills | Status: DC
Start: 2023-02-15 — End: 2023-03-23
  Filled 2023-02-15: qty 1, 30d supply, fill #0

## 2023-02-15 MED ORDER — FREESTYLE LIBRE 2 SENSOR MISC
6 refills | Status: DC
Start: 2023-02-15 — End: 2023-12-29
  Filled 2023-02-15: qty 2, 28d supply, fill #0
  Filled 2023-03-23: qty 2, 28d supply, fill #1
  Filled 2023-04-01 – 2023-04-18 (×2): qty 2, 28d supply, fill #2

## 2023-02-15 NOTE — Progress Notes (Signed)
S:     No chief complaint on file.  47 y.o. female who presents for diabetes evaluation, education, and management.  PMH is significant for T2DM, WPW syndrome, migraines, HLD.  Patient was referred and last seen by Primary Care Provider, Ricky Stabs, on 02/11/2023.   Today, patient arrives in good spirits and presents without any assistance.  Patient reports Diabetes was diagnosed initially as GDM. Progressed to T2DM ~20 years ago. No clinical ASCVD, CHF, CKD hx. No personal thyroid cancer (mom's sister does have thyroid cancer). No personal hx of pancreatitiss. Has been using insulin close to time of diagnosis.   Family/Social History:  Fhx: DM, heart disease (MI/CAD) Tobacco: former smoker (quit over a year ago)  Alcohol: rarely   Current diabetes medications include: Lantus 25u daily, Humalog four times daily per sliding scale (10-20u - takes 15-20u before eating), metformin 1000 mg BID  Patient reports adherence to taking all medications as prescribed.   Insurance coverage: [employer]  Patient denies hypoglycemic events recently.  Reported home fasting blood sugars: 200s  Reported 2 hour post-meal/random blood sugars: 400-500s even after eating.  Patient denies polyuria. Patient reports neuropathy (nerve pain). Patient reports visual changes. Patient reports self foot exams.   Patient reported dietary habits: Eats 3 meals/day Breakfast: pop-tart Lunch: sandwich, fruit cup, salad Dinner: meat and two veggies  Snacks: cheese, PB crackers Drinks: water, diet Pepsi   Patient-reported exercise habits:  -Walks a lot at work   O:  7 day average blood glucose: no GM present today  No CGM in place.  Lab Results  Component Value Date   HGBA1C 8.0 (A) 12/29/2022   There were no vitals filed for this visit.  Lipid Panel     Component Value Date/Time   TRIG 494 (H) 03/09/2021 1739    Clinical Atherosclerotic Cardiovascular Disease (ASCVD): No  The ASCVD Risk  score (Arnett DK, et al., 2019) failed to calculate for the following reasons:   Cannot find a previous HDL lab   Cannot find a previous total cholesterol lab   Patient is participating in a Managed Medicaid Plan: no   A/P: Diabetes longstanding currently uncontrolled based on A1c and reported home blood sugar. Patient is able to verbalize appropriate hypoglycemia management plan. Medication adherence appears to be appropriate. There is a big mismatch between her basal and bolus insulin. Will titrate basal today and keep bolus as a fixed dose. 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 Additionally, she has home CBG readings consistently >300 and is symptomatic today. -Increased dose of Lantus to 34u in the morning. Patient will monitor home fasting CBGs for 1 week. If readings are still consistently >200, I have instructed her to increase to 40u daily.  - Changed  Humalog to 10u TID before meals. She will not take this if her pre-meal blood sugar is <150. -Continue metformin at current dose.  -Patient educated on purpose, proper use, and potential adverse effects of Lantus, Humalog.  -Extensively discussed pathophysiology of diabetes, recommended lifestyle interventions, dietary effects on blood sugar control.  -Counseled on s/sx of and management of hypoglycemia.  -Next A1c anticipated 03/2023.   Written patient instructions provided. Patient verbalized understanding of treatment plan.  Total time in face to face counseling 30 minutes.    Follow-up:  Pharmacist in 3-4 weeks.  Butch Penny, PharmD, BCACP,  [place] [phone]

## 2023-02-16 ENCOUNTER — Other Ambulatory Visit: Payer: Self-pay

## 2023-02-17 ENCOUNTER — Other Ambulatory Visit: Payer: Self-pay

## 2023-02-18 ENCOUNTER — Other Ambulatory Visit: Payer: Self-pay

## 2023-02-21 ENCOUNTER — Other Ambulatory Visit: Payer: Self-pay

## 2023-02-22 ENCOUNTER — Other Ambulatory Visit: Payer: Self-pay

## 2023-02-25 ENCOUNTER — Other Ambulatory Visit: Payer: Self-pay | Admitting: Family

## 2023-02-25 ENCOUNTER — Other Ambulatory Visit: Payer: Self-pay

## 2023-02-25 DIAGNOSIS — Z794 Long term (current) use of insulin: Secondary | ICD-10-CM

## 2023-02-25 DIAGNOSIS — Z01818 Encounter for other preprocedural examination: Secondary | ICD-10-CM | POA: Diagnosis not present

## 2023-02-25 DIAGNOSIS — H2511 Age-related nuclear cataract, right eye: Secondary | ICD-10-CM | POA: Diagnosis not present

## 2023-02-25 MED ORDER — GABAPENTIN 300 MG PO CAPS
600.0000 mg | ORAL_CAPSULE | Freq: Four times a day (QID) | ORAL | 0 refills | Status: DC
Start: 2023-02-25 — End: 2023-04-01
  Filled 2023-02-25 – 2023-03-23 (×2): qty 240, 30d supply, fill #0

## 2023-02-25 NOTE — Telephone Encounter (Signed)
Complete

## 2023-03-03 ENCOUNTER — Other Ambulatory Visit: Payer: Self-pay

## 2023-03-11 DIAGNOSIS — E119 Type 2 diabetes mellitus without complications: Secondary | ICD-10-CM | POA: Diagnosis not present

## 2023-03-11 DIAGNOSIS — H2511 Age-related nuclear cataract, right eye: Secondary | ICD-10-CM | POA: Diagnosis not present

## 2023-03-12 DIAGNOSIS — Z419 Encounter for procedure for purposes other than remedying health state, unspecified: Secondary | ICD-10-CM | POA: Diagnosis not present

## 2023-03-14 NOTE — Progress Notes (Unsigned)
S:     No chief complaint on file.  47 y.o. female who presents for diabetes evaluation, education, and management.  PMH is significant for T2DM, HLD, migraines.  Patient was referred and last seen by Primary Care Provider, Dr. Ricky Stabs, on 02/11/2023.  *** Patient was referred by *** on ***. Patient was last seen by Primary Care Provider, Dr. ***, on ***.  At last visit, ***.   Today, patient arrives in *** good spirits and presents without *** any assistance. ***  Patient reports Diabetes was diagnosed about 20 years ago.   Family/Social History: ***  Current diabetes medications include: Lantus 34 units in the morning, Humalog 10 units TID, Metformin 1000 mg BID Current hypertension medications include: *** Current hyperlipidemia medications include: Atorvastatin 20 mg daily  Patient reports adherence to taking all medications as prescribed.  *** Patient denies adherence with medications, reports missing *** medications *** times per week, on average.  Do you feel that your medications are working for you? {YES NO:22349} Have you been experiencing any side effects to the medications prescribed? {YES NO:22349} Do you have any problems obtaining medications due to transportation or finances? {YES [id number] Insurance coverage: ***  Patient {Actions; denies-reports:120008} hypoglycemic events.  Reported home fasting blood sugars: ***  Reported 2 hour post-meal/random blood sugars: ***.  Patient {Actions; denies-reports:120008} nocturia (nighttime urination).  Patient {Actions; denies-reports:120008} neuropathy (nerve pain). Patient {Actions; denies-reports:120008} visual changes. Patient {Actions; denies-reports:120008} self foot exams.   Patient reported dietary habits: Eats *** meals/day Breakfast: *** Lunch: *** Dinner: *** Snacks: *** Drinks: ***  Within the past 12 months, did you worry whether your food would run out before you got money to buy more? {YES  NO:22349} Within the past 12 months, did the food you bought run out, and you didn't have money to get more? {YES NO:22349} PHQ-9 Score: ***  Patient-reported exercise habits: ***   O:   ROS  Physical Exam  7 day average blood glucose: ***  *** CGM Download:  % Time CGM is active: ***% Average Glucose: *** mg/dL Glucose Management Indicator: ***  Glucose Variability: *** (goal <36%) Time in Goal:  - Time in range 70-180: ***% - Time above range: ***% - Time below range: ***% Observed patterns:   Lab Results  Component Value Date   HGBA1C 8.0 (A) 12/29/2022   There were no vitals filed for this visit.  Lipid Panel     Component Value Date/Time   TRIG 494 (H) 03/09/2021 1739    Clinical Atherosclerotic Cardiovascular Disease (ASCVD): {YES/NO:21197} The ASCVD Risk score (Arnett DK, et al., 2019) failed to calculate for the following reasons:   Cannot find a previous HDL lab   Cannot find a previous total cholesterol lab   Patient is participating in a Managed Medicaid Plan:  {MM YES/NO:27447::"Yes"}   A/P: Diabetes longstanding *** currently ***. Patient is *** able to verbalize appropriate hypoglycemia management plan. Medication adherence appears ***. Control is suboptimal due to ***. -{Meds adjust:18428} basal insulin *** Lantus/Basaglar/Semglee (insulin glargine) *** Tresiba (insulin degludec) from *** units to *** units daily in the morning. Patient will continue to titrate 1 unit every *** days if fasting blood sugar > 100mg /dl until fasting blood sugars reach goal or next visit.  -{Meds adjust:18428} rapid insulin *** Novolog (insulin aspart) *** Humalog (insulin lispro) from *** to ***.  -{Meds adjust:18428} GLP-1 *** Trulicity (dulaglutide) *** Ozempic (semaglutide) *** Mounjaro (tirzepatide) from *** mg to *** mg .  -{Meds  adjust:18428} SGLT2-I *** Farxiga (dapagliflozin) *** Jardiance (empagliflozin) 10 mg. Counseled on sick day rules. -{Meds adjust:18428}  metformin ***.  -Patient educated on purpose, proper use, and potential adverse effects of ***.  -Extensively discussed pathophysiology of diabetes, recommended lifestyle interventions, dietary effects on blood sugar control.  -Counseled on s/sx of and management of hypoglycemia.  -Next A1c anticipated ***.   ASCVD risk - primary ***secondary prevention in patient with diabetes. Last LDL is *** not at goal of <16 *** mg/dL. ASCVD risk factors include *** and 10-year ASCVD risk score of ***. {Desc; low/moderate/high:110033} intensity statin indicated.  -{Meds adjust:18428} ***statin *** mg.   Hypertension longstanding *** currently ***. Blood pressure goal of <130/80 *** mmHg. Medication adherence ***. Blood pressure control is suboptimal due to ***. -{Meds adjust:18428} *** mg.  Written patient instructions provided. Patient verbalized understanding of treatment plan.  Total time in face to face counseling *** minutes.    Follow-up:  Pharmacist ***. PCP clinic visit in ***.  Patient seen with ***

## 2023-03-15 ENCOUNTER — Ambulatory Visit: Payer: Medicaid Other | Admitting: Pharmacist

## 2023-03-23 ENCOUNTER — Other Ambulatory Visit: Payer: Self-pay

## 2023-03-23 ENCOUNTER — Other Ambulatory Visit: Payer: Self-pay | Admitting: Family Medicine

## 2023-03-23 DIAGNOSIS — Z794 Long term (current) use of insulin: Secondary | ICD-10-CM

## 2023-03-23 MED ORDER — FREESTYLE LIBRE 2 READER DEVI
0 refills | Status: DC
Start: 2023-03-23 — End: 2023-04-20
  Filled 2023-03-23: qty 1, 28d supply, fill #0
  Filled 2023-04-01: qty 1, fill #0
  Filled 2023-04-18: qty 1, 90d supply, fill #0

## 2023-03-24 ENCOUNTER — Other Ambulatory Visit: Payer: Self-pay

## 2023-03-25 ENCOUNTER — Other Ambulatory Visit: Payer: Self-pay

## 2023-03-28 ENCOUNTER — Ambulatory Visit: Payer: Medicaid Other | Admitting: Neurology

## 2023-03-28 ENCOUNTER — Encounter: Payer: Self-pay | Admitting: Neurology

## 2023-03-31 ENCOUNTER — Telehealth: Payer: Self-pay | Admitting: Family

## 2023-03-31 NOTE — Telephone Encounter (Signed)
Copied from CRM [id number]. Topic: General - Inquiry >> Mar 31, 2023  8:33 AM Runell Gess P wrote: Reason for CRM: pt called saying the glucose monitor that she sticks on her arm keeps falling off.  please advise  CB#  [phone]

## 2023-04-01 ENCOUNTER — Other Ambulatory Visit: Payer: Self-pay

## 2023-04-01 ENCOUNTER — Other Ambulatory Visit: Payer: Self-pay | Admitting: Family

## 2023-04-01 DIAGNOSIS — Z794 Long term (current) use of insulin: Secondary | ICD-10-CM

## 2023-04-01 MED ORDER — GABAPENTIN 300 MG PO CAPS
600.00 mg | ORAL_CAPSULE | Freq: Four times a day (QID) | ORAL | 0 refills | Status: AC
Start: 2023-04-01 — End: ?
  Filled 2023-04-01 – 2023-04-18 (×2): qty 240, 30d supply, fill #0

## 2023-04-01 NOTE — Telephone Encounter (Signed)
Requested medications are due for refill today.  yes  Requested medications are on the active medications list.  yes  Last refill. 12/29/2022   Future visit scheduled.   no  Notes to clinic.  Rx written to expire 03/29/2023 - Rx is expired.    Requested Prescriptions  Pending Prescriptions Disp Refills   metFORMIN (GLUCOPHAGE) 1000 MG tablet 180 tablet 0    Sig: Take 1 tablet (1,000 mg total) by mouth 2 (two) times daily with a meal.     Endocrinology:  Diabetes - Biguanides Failed - 04/01/2023 11:26 AM      Failed - HBA1C is between 0 and 7.9 and within 180 days    Hemoglobin A1C  Date Value Ref Range Status  12/29/2022 8.0 (A) 4.0 - 5.6 % Final   HbA1c, POC (prediabetic range)  Date Value Ref Range Status  08/23/2018 6.3 5.7 - 6.4 % Final   HbA1c, POC (controlled diabetic range)  Date Value Ref Range Status  11/16/2019 8.4 (A) 0.0 - 7.0 % Final   Hgb A1c MFr Bld  Date Value Ref Range Status  06/08/2021 10.1 (H) 4.8 - 5.6 % Final    Comment:    (NOTE) Pre diabetes:          5.7%-6.4%  Diabetes:              >6.4%  Glycemic control for   <7.0% adults with diabetes          Failed - B12 Level in normal range and within 720 days    No results found for: "VITAMINB12"       Passed - Cr in normal range and within 360 days    Creat  Date Value Ref Range Status  10/07/2016 1.00 0.50 - 1.10 mg/dL Final   Creatinine, Ser  Date Value Ref Range Status  01/05/2023 0.82 0.44 - 1.00 mg/dL Final         Passed - eGFR in normal range and within 360 days    GFR calc Af Amer  Date Value Ref Range Status  07/12/2020 >60 >60 mL/min Final   GFR, Estimated  Date Value Ref Range Status  01/05/2023 >60 >60 mL/min Final    Comment:    (NOTE) Calculated using the CKD-EPI Creatinine Equation (2021)    eGFR  Date Value Ref Range Status  08/18/2021 73 >59 mL/min/1.73 Final         Passed - Valid encounter within last 6 months    Recent Outpatient Visits           1  month ago Type 2 diabetes mellitus with diabetic mononeuropathy, with long-term current use of insulin (HCC)   [place] [facility] [place], Cornelius Moras, RPH-CPP   1 month ago Diarrhea, unspecified type   [place] [facility], [place], NP   3 months ago Neuropathy   [place] [facility], [place], [place]   1 year ago COVID-19   [place] [facility] [address] [facility], Gildardo Pounds, NP   1 year ago Type 2 diabetes mellitus with diabetic mononeuropathy, with long-term current use of insulin [facility])   [place] [facility], Amy J, NP              Passed - CBC within normal limits and completed in the last 12 months    WBC  Date Value Ref Range Status  01/05/2023 9.8 4.0 - 10.5 K/uL Final   RBC  Date Value Ref Range Status  01/05/2023 4.87 3.87 - 5.11 MIL/uL Final   Hemoglobin  Date Value Ref Range Status  01/05/2023 14.3 12.0 - 15.0 g/dL Final  16/07/9603 54.0 11.1 - 15.9 g/dL Final   HCT  Date Value Ref Range Status  01/05/2023 41.6 36.0 - 46.0 % Final   Hematocrit  Date Value Ref Range Status  08/18/2021 44.1 34.0 - 46.6 % Final   MCHC  Date Value Ref Range Status  01/05/2023 34.4 30.0 - 36.0 g/dL Final   [facility]  Date Value Ref Range Status  01/05/2023 29.4 26.0 - 34.0 pg Final   MCV  Date Value Ref Range Status  01/05/2023 85.4 80.0 - 100.0 fL Final  08/18/2021 87 79 - 97 fL Final   No results found for: "PLTCOUNTKUC", "LABPLAT", "POCPLA" RDW  Date Value Ref Range Status  01/05/2023 11.9 11.5 - 15.5 % Final  08/18/2021 13.1 11.7 - 15.4 % Final         Signed Prescriptions Disp Refills   gabapentin (NEURONTIN) 300 MG capsule 240 capsule 0    Sig: Take 2 capsules (600 mg total) by mouth 4 (four) times daily.     Neurology: Anticonvulsants - gabapentin Passed - 04/01/2023 11:26 AM      Passed - Cr in normal range and within 360 days    Creat   Date Value Ref Range Status  10/07/2016 1.00 0.50 - 1.10 mg/dL Final   Creatinine, Ser  Date Value Ref Range Status  01/05/2023 0.82 0.44 - 1.00 mg/dL Final         Passed - Completed PHQ-2 or PHQ-9 in the last 360 days      Passed - Valid encounter within last 12 months    Recent Outpatient Visits           1 month ago Type 2 diabetes mellitus with diabetic mononeuropathy, with long-term current use of insulin (HCC)   [place] [facility] [place], Cornelius Moras, RPH-CPP   1 month ago Diarrhea, unspecified type   [place] [facility], [place], [place]   3 months ago Neuropathy   [place] [facility], [place], NP   1 year ago COVID-19   [facility], Gildardo Pounds, NP   1 year ago Type 2 diabetes mellitus with diabetic mononeuropathy, with long-term current use of insulin [facility])   [place] [facility] at [facility], Salomon Fick, NP

## 2023-04-01 NOTE — Telephone Encounter (Signed)
Requested Prescriptions  Pending Prescriptions Disp Refills   metFORMIN (GLUCOPHAGE) 1000 MG tablet 180 tablet 0    Sig: Take 1 tablet (1,000 mg total) by mouth 2 (two) times daily with a meal.     Endocrinology:  Diabetes - Biguanides Failed - 04/01/2023 11:26 AM      Failed - HBA1C is between 0 and 7.9 and within 180 days    Hemoglobin A1C  Date Value Ref Range Status  12/29/2022 8.0 (A) 4.0 - 5.6 % Final   HbA1c, POC (prediabetic range)  Date Value Ref Range Status  08/23/2018 6.3 5.7 - 6.4 % Final   HbA1c, POC (controlled diabetic range)  Date Value Ref Range Status  11/16/2019 8.4 (A) 0.0 - 7.0 % Final   Hgb A1c MFr Bld  Date Value Ref Range Status  06/08/2021 10.1 (H) 4.8 - 5.6 % Final    Comment:    (NOTE) Pre diabetes:          5.7%-6.4%  Diabetes:              >6.4%  Glycemic control for   <7.0% adults with diabetes          Failed - B12 Level in normal range and within 720 days    No results found for: "VITAMINB12"       Passed - Cr in normal range and within 360 days    Creat  Date Value Ref Range Status  10/07/2016 1.00 0.50 - 1.10 mg/dL Final   Creatinine, Ser  Date Value Ref Range Status  01/05/2023 0.82 0.44 - 1.00 mg/dL Final         Passed - eGFR in normal range and within 360 days    GFR calc Af Amer  Date Value Ref Range Status  07/12/2020 >60 >60 mL/min Final   GFR, Estimated  Date Value Ref Range Status  01/05/2023 >60 >60 mL/min Final    Comment:    (NOTE) Calculated using the CKD-EPI Creatinine Equation (2021)    eGFR  Date Value Ref Range Status  08/18/2021 73 >59 mL/min/1.73 Final         Passed - Valid encounter within last 6 months    Recent Outpatient Visits           1 month ago Type 2 diabetes mellitus with diabetic mononeuropathy, with long-term current use of insulin (HCC)   [place] [facility] [place], Cornelius Moras, RPH-CPP   1 month ago Diarrhea, unspecified type   [place] [facility], [place], NP   3 months ago Neuropathy   [place] [facility], [place], NP   1 year ago COVID-19   [facility], Gildardo Pounds, NP   1 year ago Type 2 diabetes mellitus with diabetic mononeuropathy, with long-term current use of insulin (HCC)   [place] [facility], Amy J, NP              Passed - CBC within normal limits and completed in the last 12 months    WBC  Date Value Ref Range Status  01/05/2023 9.8 4.0 - 10.5 K/uL Final   RBC  Date Value Ref Range Status  01/05/2023 4.87 3.87 - 5.11 MIL/uL Final   Hemoglobin  Date Value Ref Range Status  01/05/2023 14.3 12.0 - 15.0 g/dL Final  16/07/9603 54.0 11.1 - 15.9 g/dL  Final   HCT  Date Value Ref Range Status  01/05/2023 41.6 36.0 - 46.0 % Final   Hematocrit  Date Value Ref Range Status  08/18/2021 44.1 34.0 - 46.6 % Final   MCHC  Date Value Ref Range Status  01/05/2023 34.4 30.0 - 36.0 g/dL Final   [facility]  Date Value Ref Range Status  01/05/2023 29.4 26.0 - 34.0 pg Final   MCV  Date Value Ref Range Status  01/05/2023 85.4 80.0 - 100.0 fL Final  08/18/2021 87 79 - 97 fL Final   No results found for: "PLTCOUNTKUC", "LABPLAT", "POCPLA" RDW  Date Value Ref Range Status  01/05/2023 11.9 11.5 - 15.5 % Final  08/18/2021 13.1 11.7 - 15.4 % Final          gabapentin (NEURONTIN) 300 MG capsule 240 capsule 0    Sig: Take 2 capsules (600 mg total) by mouth 4 (four) times daily.     Neurology: Anticonvulsants - gabapentin Passed - 04/01/2023 11:26 AM      Passed - Cr in normal range and within 360 days    Creat  Date Value Ref Range Status  10/07/2016 1.00 0.50 - 1.10 mg/dL Final   Creatinine, Ser  Date Value Ref Range Status  01/05/2023 0.82 0.44 - 1.00 mg/dL Final         Passed - Completed PHQ-2 or PHQ-9 in the last 360 days      Passed - Valid encounter within last 12 months    Recent  Outpatient Visits           1 month ago Type 2 diabetes mellitus with diabetic mononeuropathy, with long-term current use of insulin (HCC)   [place] [facility] [place], Cornelius Moras, RPH-CPP   1 month ago Diarrhea, unspecified type   [place] [facility], [place], NP   3 months ago Neuropathy   [place] [facility], [place], [place]   1 year ago COVID-19   [facility], Gildardo Pounds, NP   1 year ago Type 2 diabetes mellitus with diabetic mononeuropathy, with long-term current use of insulin [facility])    Primary Care at [facility], Salomon Fick, NP

## 2023-04-04 DIAGNOSIS — M79671 Pain in right foot: Secondary | ICD-10-CM | POA: Diagnosis not present

## 2023-04-04 DIAGNOSIS — M2550 Pain in unspecified joint: Secondary | ICD-10-CM | POA: Diagnosis not present

## 2023-04-04 DIAGNOSIS — M79641 Pain in right hand: Secondary | ICD-10-CM | POA: Diagnosis not present

## 2023-04-04 DIAGNOSIS — M19042 Primary osteoarthritis, left hand: Secondary | ICD-10-CM | POA: Diagnosis not present

## 2023-04-04 DIAGNOSIS — M797 Fibromyalgia: Secondary | ICD-10-CM | POA: Insufficient documentation

## 2023-04-04 DIAGNOSIS — M791 Myalgia, unspecified site: Secondary | ICD-10-CM | POA: Diagnosis not present

## 2023-04-04 DIAGNOSIS — M79672 Pain in left foot: Secondary | ICD-10-CM | POA: Diagnosis not present

## 2023-04-04 DIAGNOSIS — E1165 Type 2 diabetes mellitus with hyperglycemia: Secondary | ICD-10-CM | POA: Diagnosis not present

## 2023-04-04 DIAGNOSIS — M180 Bilateral primary osteoarthritis of first carpometacarpal joints: Secondary | ICD-10-CM | POA: Diagnosis not present

## 2023-04-04 DIAGNOSIS — M79642 Pain in left hand: Secondary | ICD-10-CM | POA: Diagnosis not present

## 2023-04-05 ENCOUNTER — Other Ambulatory Visit: Payer: Self-pay | Admitting: Family

## 2023-04-05 ENCOUNTER — Other Ambulatory Visit: Payer: Self-pay

## 2023-04-05 DIAGNOSIS — E1165 Type 2 diabetes mellitus with hyperglycemia: Secondary | ICD-10-CM

## 2023-04-05 MED ORDER — ACCU-CHEK GUIDE ME W/DEVICE KIT
1.0000 | PACK | Freq: Three times a day (TID) | 0 refills | Status: AC
Start: 2023-04-05 — End: ?
  Filled 2023-04-05: qty 1, 1d supply, fill #0

## 2023-04-05 MED ORDER — TRUE METRIX BLOOD GLUCOSE TEST VI STRP
1.0000 | ORAL_STRIP | Freq: Three times a day (TID) | 12 refills | Status: AC
Start: 2023-04-05 — End: ?
  Filled 2023-04-05: qty 100, 25d supply, fill #0

## 2023-04-05 MED ORDER — ACCU-CHEK SOFTCLIX LANCETS MISC
1.0000 | Freq: Three times a day (TID) | 4 refills | Status: AC
Start: 2023-04-05 — End: ?
  Filled 2023-04-05: qty 100, 25d supply, fill #0

## 2023-04-05 NOTE — Telephone Encounter (Signed)
True Metrix prescribed.

## 2023-04-11 ENCOUNTER — Other Ambulatory Visit: Payer: Self-pay

## 2023-04-11 DIAGNOSIS — Z419 Encounter for procedure for purposes other than remedying health state, unspecified: Secondary | ICD-10-CM | POA: Diagnosis not present

## 2023-04-18 ENCOUNTER — Other Ambulatory Visit: Payer: Self-pay

## 2023-04-19 ENCOUNTER — Other Ambulatory Visit: Payer: Self-pay

## 2023-04-20 ENCOUNTER — Other Ambulatory Visit: Payer: Self-pay

## 2023-04-20 ENCOUNTER — Other Ambulatory Visit (HOSPITAL_COMMUNITY): Payer: Self-pay

## 2023-04-21 ENCOUNTER — Other Ambulatory Visit: Payer: Self-pay

## 2023-04-21 MED ORDER — METFORMIN HCL 1000 MG PO TABS
1000.0000 mg | ORAL_TABLET | Freq: Two times a day (BID) | ORAL | 0 refills | Status: AC
  Filled 2023-04-21: qty 180, 90d supply, fill #0

## 2023-04-28 ENCOUNTER — Ambulatory Visit: Payer: Self-pay | Admitting: *Deleted

## 2023-04-28 NOTE — Telephone Encounter (Signed)
Summary: pain   Pt called saying she was diagnosed with Fibromyalgia and Amy Zonia Kief knows and sent to pain management but they have not reached out and  she is in a lot of pain.  Please advise [phone]        Patient is calling to follow up on referral to pain clinic, requesting muscle relaxer for her pain- triage for pain as for provider information Chief Complaint: Patient states she requested referral to pain management for fibromyalgia- not able to find referral Patient request medication for pain until she can be seen- patient states she has been tempted to go to ED for her pain Patient advised to be seen for pain- no open appointment at office- PCP not in office- will send to provider in office for review Symptoms: all over muscle pain and stiffness Frequency: chronic Pertinent Negatives: Patient denies chest pain, rash, cold or flu symptoms, weight loss Disposition: [] ED /[] Urgent Care (no appt availability in office) / [] Appointment(In office/virtual)/ []  [place] [facility]/ [] Home Care/ [] Refused Recommended Disposition /[] [place] [facility]/ [x]  Follow-up with PCP Additional Notes: No open appointment in office- PCP not in office- patient asked for review by covering provider   Reason for Disposition  [1] SEVERE pain (e.g., excruciating, unable to do any normal activities) AND [2] not improved 2 hours after pain medicine  Answer Assessment - Initial Assessment Questions 1. ONSET: "When did the muscle aches or body pains start?"      Chronic- severe, diagnosed through rheumatology- fibromyalgia   2. LOCATION: "What part of your body is hurting?" (e.g., entire body, arms, legs)      All over- all muscles are stiff 3. SEVERITY: "How bad is the pain?" (Scale 1-10; or mild, moderate, severe)   - MILD (1-3): doesn't interfere with normal activities    - MODERATE (4-7): interferes with normal activities or awakens from sleep    - SEVERE (8-10):  excruciating pain,  unable to do any normal activities      severe 4. CAUSE: "What do you think is causing the pains?"     Chronic pain 5. FEVER: "Have you been having fever?"     no 6. OTHER SYMPTOMS: "Do you have any other symptoms?" (e.g., chest pain, weakness, rash, cold or flu symptoms, weight loss)     weakness  Protocols used: Muscle Aches and Body Pain-A-AH

## 2023-05-03 ENCOUNTER — Other Ambulatory Visit: Payer: Self-pay | Admitting: Family

## 2023-05-03 DIAGNOSIS — H35351 Cystoid macular degeneration, right eye: Secondary | ICD-10-CM | POA: Diagnosis not present

## 2023-05-03 DIAGNOSIS — H3091 Unspecified chorioretinal inflammation, right eye: Secondary | ICD-10-CM | POA: Diagnosis not present

## 2023-05-03 DIAGNOSIS — M797 Fibromyalgia: Secondary | ICD-10-CM

## 2023-05-03 DIAGNOSIS — G894 Chronic pain syndrome: Secondary | ICD-10-CM

## 2023-05-03 NOTE — Telephone Encounter (Signed)
Call patient with update. Patient established with Rheumatology for management of fibromyalgia. Last office visit with the same was on 04/04/2023. Today referral to Pain Management ordered. Expect call within 2 weeks with appointment details. During the interim report to the Emergency Department/Urgent Care for immediate medical evaluation. Follow-up with Primary Care at discharge.

## 2023-05-03 NOTE — Telephone Encounter (Signed)
Patient aware of update. Verbalizes understanding with regard to the appointment/ referral.

## 2023-05-10 DIAGNOSIS — M129 Arthropathy, unspecified: Secondary | ICD-10-CM | POA: Diagnosis not present

## 2023-05-10 DIAGNOSIS — M797 Fibromyalgia: Secondary | ICD-10-CM | POA: Diagnosis not present

## 2023-05-10 DIAGNOSIS — E7800 Pure hypercholesterolemia, unspecified: Secondary | ICD-10-CM | POA: Diagnosis not present

## 2023-05-10 DIAGNOSIS — D539 Nutritional anemia, unspecified: Secondary | ICD-10-CM | POA: Diagnosis not present

## 2023-05-10 DIAGNOSIS — M25561 Pain in right knee: Secondary | ICD-10-CM | POA: Diagnosis not present

## 2023-05-10 DIAGNOSIS — R5383 Other fatigue: Secondary | ICD-10-CM | POA: Diagnosis not present

## 2023-05-10 DIAGNOSIS — Z1159 Encounter for screening for other viral diseases: Secondary | ICD-10-CM | POA: Diagnosis not present

## 2023-05-10 DIAGNOSIS — Z79899 Other long term (current) drug therapy: Secondary | ICD-10-CM | POA: Diagnosis not present

## 2023-05-10 DIAGNOSIS — E119 Type 2 diabetes mellitus without complications: Secondary | ICD-10-CM | POA: Diagnosis not present

## 2023-05-10 DIAGNOSIS — E559 Vitamin D deficiency, unspecified: Secondary | ICD-10-CM | POA: Diagnosis not present

## 2023-05-10 DIAGNOSIS — R0602 Shortness of breath: Secondary | ICD-10-CM | POA: Diagnosis not present

## 2023-05-12 DIAGNOSIS — Z419 Encounter for procedure for purposes other than remedying health state, unspecified: Secondary | ICD-10-CM | POA: Diagnosis not present

## 2023-05-13 DIAGNOSIS — Z79899 Other long term (current) drug therapy: Secondary | ICD-10-CM | POA: Diagnosis not present

## 2023-05-17 DIAGNOSIS — R030 Elevated blood-pressure reading, without diagnosis of hypertension: Secondary | ICD-10-CM | POA: Diagnosis not present

## 2023-05-17 DIAGNOSIS — E7800 Pure hypercholesterolemia, unspecified: Secondary | ICD-10-CM | POA: Diagnosis not present

## 2023-05-17 DIAGNOSIS — Z79899 Other long term (current) drug therapy: Secondary | ICD-10-CM | POA: Diagnosis not present

## 2023-05-17 DIAGNOSIS — R0602 Shortness of breath: Secondary | ICD-10-CM | POA: Diagnosis not present

## 2023-05-17 DIAGNOSIS — M797 Fibromyalgia: Secondary | ICD-10-CM | POA: Diagnosis not present

## 2023-05-17 DIAGNOSIS — E781 Pure hyperglyceridemia: Secondary | ICD-10-CM | POA: Diagnosis not present

## 2023-05-17 DIAGNOSIS — E119 Type 2 diabetes mellitus without complications: Secondary | ICD-10-CM | POA: Diagnosis not present

## 2023-05-17 DIAGNOSIS — E6609 Other obesity due to excess calories: Secondary | ICD-10-CM | POA: Diagnosis not present

## 2023-05-17 DIAGNOSIS — Z6831 Body mass index (BMI) 31.0-31.9, adult: Secondary | ICD-10-CM | POA: Diagnosis not present

## 2023-05-24 DIAGNOSIS — Z79899 Other long term (current) drug therapy: Secondary | ICD-10-CM | POA: Diagnosis not present

## 2023-06-01 DIAGNOSIS — H3091 Unspecified chorioretinal inflammation, right eye: Secondary | ICD-10-CM | POA: Diagnosis not present

## 2023-06-06 ENCOUNTER — Other Ambulatory Visit: Payer: Self-pay | Admitting: Family

## 2023-06-06 DIAGNOSIS — E7800 Pure hypercholesterolemia, unspecified: Secondary | ICD-10-CM | POA: Diagnosis not present

## 2023-06-06 DIAGNOSIS — Z794 Long term (current) use of insulin: Secondary | ICD-10-CM

## 2023-06-06 DIAGNOSIS — Z6831 Body mass index (BMI) 31.0-31.9, adult: Secondary | ICD-10-CM | POA: Diagnosis not present

## 2023-06-06 DIAGNOSIS — R030 Elevated blood-pressure reading, without diagnosis of hypertension: Secondary | ICD-10-CM | POA: Diagnosis not present

## 2023-06-06 DIAGNOSIS — R079 Chest pain, unspecified: Secondary | ICD-10-CM | POA: Diagnosis not present

## 2023-06-06 DIAGNOSIS — E119 Type 2 diabetes mellitus without complications: Secondary | ICD-10-CM | POA: Diagnosis not present

## 2023-06-06 DIAGNOSIS — E781 Pure hyperglyceridemia: Secondary | ICD-10-CM | POA: Diagnosis not present

## 2023-06-06 DIAGNOSIS — Z79899 Other long term (current) drug therapy: Secondary | ICD-10-CM | POA: Diagnosis not present

## 2023-06-06 DIAGNOSIS — E6609 Other obesity due to excess calories: Secondary | ICD-10-CM | POA: Diagnosis not present

## 2023-06-06 DIAGNOSIS — E559 Vitamin D deficiency, unspecified: Secondary | ICD-10-CM | POA: Diagnosis not present

## 2023-06-06 DIAGNOSIS — M797 Fibromyalgia: Secondary | ICD-10-CM | POA: Diagnosis not present

## 2023-06-07 ENCOUNTER — Other Ambulatory Visit: Payer: Self-pay

## 2023-06-08 DIAGNOSIS — Z79899 Other long term (current) drug therapy: Secondary | ICD-10-CM | POA: Diagnosis not present

## 2023-06-12 DIAGNOSIS — Z419 Encounter for procedure for purposes other than remedying health state, unspecified: Secondary | ICD-10-CM | POA: Diagnosis not present

## 2023-06-14 ENCOUNTER — Other Ambulatory Visit: Payer: Self-pay

## 2023-06-14 DIAGNOSIS — E6609 Other obesity due to excess calories: Secondary | ICD-10-CM | POA: Diagnosis not present

## 2023-06-14 DIAGNOSIS — R030 Elevated blood-pressure reading, without diagnosis of hypertension: Secondary | ICD-10-CM | POA: Diagnosis not present

## 2023-06-14 DIAGNOSIS — R0602 Shortness of breath: Secondary | ICD-10-CM | POA: Diagnosis not present

## 2023-06-14 DIAGNOSIS — R942 Abnormal results of pulmonary function studies: Secondary | ICD-10-CM | POA: Diagnosis not present

## 2023-06-14 DIAGNOSIS — Z6832 Body mass index (BMI) 32.0-32.9, adult: Secondary | ICD-10-CM | POA: Diagnosis not present

## 2023-06-15 ENCOUNTER — Other Ambulatory Visit: Payer: Self-pay | Admitting: Nurse Practitioner

## 2023-06-15 ENCOUNTER — Other Ambulatory Visit: Payer: Self-pay

## 2023-06-15 DIAGNOSIS — G8929 Other chronic pain: Secondary | ICD-10-CM

## 2023-06-15 DIAGNOSIS — R519 Headache, unspecified: Secondary | ICD-10-CM

## 2023-06-15 DIAGNOSIS — M62838 Other muscle spasm: Secondary | ICD-10-CM

## 2023-06-16 ENCOUNTER — Other Ambulatory Visit: Payer: Self-pay

## 2023-06-16 ENCOUNTER — Other Ambulatory Visit: Payer: Self-pay | Admitting: Oncology

## 2023-06-16 DIAGNOSIS — Z006 Encounter for examination for normal comparison and control in clinical research program: Secondary | ICD-10-CM

## 2023-06-16 MED ORDER — CYCLOBENZAPRINE HCL 5 MG PO TABS
5.0000 mg | ORAL_TABLET | Freq: Two times a day (BID) | ORAL | 1 refills | Status: DC
  Filled 2023-06-16: qty 30, 15d supply, fill #0

## 2023-06-16 NOTE — Telephone Encounter (Signed)
Complete

## 2023-06-21 DIAGNOSIS — E7800 Pure hypercholesterolemia, unspecified: Secondary | ICD-10-CM | POA: Diagnosis not present

## 2023-06-21 DIAGNOSIS — E781 Pure hyperglyceridemia: Secondary | ICD-10-CM | POA: Diagnosis not present

## 2023-06-21 DIAGNOSIS — Z6831 Body mass index (BMI) 31.0-31.9, adult: Secondary | ICD-10-CM | POA: Diagnosis not present

## 2023-06-21 DIAGNOSIS — F418 Other specified anxiety disorders: Secondary | ICD-10-CM | POA: Diagnosis not present

## 2023-06-21 DIAGNOSIS — M797 Fibromyalgia: Secondary | ICD-10-CM | POA: Diagnosis not present

## 2023-06-21 DIAGNOSIS — E6609 Other obesity due to excess calories: Secondary | ICD-10-CM | POA: Diagnosis not present

## 2023-06-21 DIAGNOSIS — Z79899 Other long term (current) drug therapy: Secondary | ICD-10-CM | POA: Diagnosis not present

## 2023-06-21 DIAGNOSIS — E119 Type 2 diabetes mellitus without complications: Secondary | ICD-10-CM | POA: Diagnosis not present

## 2023-06-22 ENCOUNTER — Other Ambulatory Visit: Payer: Self-pay

## 2023-06-24 DIAGNOSIS — Z79899 Other long term (current) drug therapy: Secondary | ICD-10-CM | POA: Diagnosis not present

## 2023-06-29 DIAGNOSIS — H35351 Cystoid macular degeneration, right eye: Secondary | ICD-10-CM | POA: Diagnosis not present

## 2023-06-29 DIAGNOSIS — H3091 Unspecified chorioretinal inflammation, right eye: Secondary | ICD-10-CM | POA: Diagnosis not present

## 2023-07-07 ENCOUNTER — Other Ambulatory Visit: Payer: Self-pay | Admitting: Family

## 2023-07-07 DIAGNOSIS — Z794 Long term (current) use of insulin: Secondary | ICD-10-CM

## 2023-07-08 ENCOUNTER — Other Ambulatory Visit: Payer: Self-pay

## 2023-07-08 MED ORDER — GABAPENTIN 300 MG PO CAPS
600.0000 mg | ORAL_CAPSULE | Freq: Four times a day (QID) | ORAL | 2 refills | Status: AC
Start: 2023-07-08 — End: ?
  Filled 2023-07-08: qty 240, 30d supply, fill #0
  Filled 2023-08-10: qty 240, 30d supply, fill #1
  Filled 2023-09-19: qty 240, 30d supply, fill #2

## 2023-07-08 NOTE — Telephone Encounter (Signed)
Complete

## 2023-07-12 DIAGNOSIS — Z419 Encounter for procedure for purposes other than remedying health state, unspecified: Secondary | ICD-10-CM | POA: Diagnosis not present

## 2023-07-13 ENCOUNTER — Other Ambulatory Visit: Payer: Self-pay

## 2023-08-02 DIAGNOSIS — Z79899 Other long term (current) drug therapy: Secondary | ICD-10-CM | POA: Diagnosis not present

## 2023-08-02 DIAGNOSIS — E7800 Pure hypercholesterolemia, unspecified: Secondary | ICD-10-CM | POA: Diagnosis not present

## 2023-08-02 DIAGNOSIS — E119 Type 2 diabetes mellitus without complications: Secondary | ICD-10-CM | POA: Diagnosis not present

## 2023-08-02 DIAGNOSIS — E6609 Other obesity due to excess calories: Secondary | ICD-10-CM | POA: Diagnosis not present

## 2023-08-02 DIAGNOSIS — E559 Vitamin D deficiency, unspecified: Secondary | ICD-10-CM | POA: Diagnosis not present

## 2023-08-02 DIAGNOSIS — Z6831 Body mass index (BMI) 31.0-31.9, adult: Secondary | ICD-10-CM | POA: Diagnosis not present

## 2023-08-02 DIAGNOSIS — M797 Fibromyalgia: Secondary | ICD-10-CM | POA: Diagnosis not present

## 2023-08-02 DIAGNOSIS — E781 Pure hyperglyceridemia: Secondary | ICD-10-CM | POA: Diagnosis not present

## 2023-08-05 DIAGNOSIS — Z79899 Other long term (current) drug therapy: Secondary | ICD-10-CM | POA: Diagnosis not present

## 2023-08-12 DIAGNOSIS — Z419 Encounter for procedure for purposes other than remedying health state, unspecified: Secondary | ICD-10-CM | POA: Diagnosis not present

## 2023-08-15 ENCOUNTER — Other Ambulatory Visit: Payer: Self-pay

## 2023-08-15 DIAGNOSIS — R002 Palpitations: Secondary | ICD-10-CM | POA: Diagnosis not present

## 2023-08-15 DIAGNOSIS — Z9889 Other specified postprocedural states: Secondary | ICD-10-CM | POA: Diagnosis not present

## 2023-08-15 DIAGNOSIS — R079 Chest pain, unspecified: Secondary | ICD-10-CM | POA: Diagnosis not present

## 2023-08-15 DIAGNOSIS — R0602 Shortness of breath: Secondary | ICD-10-CM | POA: Diagnosis not present

## 2023-08-15 DIAGNOSIS — R1110 Vomiting, unspecified: Secondary | ICD-10-CM | POA: Diagnosis not present

## 2023-08-15 DIAGNOSIS — M79661 Pain in right lower leg: Secondary | ICD-10-CM | POA: Diagnosis not present

## 2023-08-15 DIAGNOSIS — Z8679 Personal history of other diseases of the circulatory system: Secondary | ICD-10-CM | POA: Diagnosis not present

## 2023-08-29 DIAGNOSIS — E559 Vitamin D deficiency, unspecified: Secondary | ICD-10-CM | POA: Diagnosis not present

## 2023-08-29 DIAGNOSIS — E119 Type 2 diabetes mellitus without complications: Secondary | ICD-10-CM | POA: Diagnosis not present

## 2023-08-29 DIAGNOSIS — Z6832 Body mass index (BMI) 32.0-32.9, adult: Secondary | ICD-10-CM | POA: Diagnosis not present

## 2023-08-29 DIAGNOSIS — E6609 Other obesity due to excess calories: Secondary | ICD-10-CM | POA: Diagnosis not present

## 2023-08-29 DIAGNOSIS — M542 Cervicalgia: Secondary | ICD-10-CM | POA: Diagnosis not present

## 2023-08-29 DIAGNOSIS — M797 Fibromyalgia: Secondary | ICD-10-CM | POA: Diagnosis not present

## 2023-08-29 DIAGNOSIS — Z79899 Other long term (current) drug therapy: Secondary | ICD-10-CM | POA: Diagnosis not present

## 2023-08-29 DIAGNOSIS — E781 Pure hyperglyceridemia: Secondary | ICD-10-CM | POA: Diagnosis not present

## 2023-08-29 DIAGNOSIS — E7800 Pure hypercholesterolemia, unspecified: Secondary | ICD-10-CM | POA: Diagnosis not present

## 2023-08-29 DIAGNOSIS — Z1211 Encounter for screening for malignant neoplasm of colon: Secondary | ICD-10-CM | POA: Diagnosis not present

## 2023-08-31 DIAGNOSIS — Z79899 Other long term (current) drug therapy: Secondary | ICD-10-CM | POA: Diagnosis not present

## 2023-09-05 DIAGNOSIS — E7800 Pure hypercholesterolemia, unspecified: Secondary | ICD-10-CM | POA: Diagnosis not present

## 2023-09-05 DIAGNOSIS — M797 Fibromyalgia: Secondary | ICD-10-CM | POA: Diagnosis not present

## 2023-09-05 DIAGNOSIS — E6609 Other obesity due to excess calories: Secondary | ICD-10-CM | POA: Diagnosis not present

## 2023-09-05 DIAGNOSIS — Z79899 Other long term (current) drug therapy: Secondary | ICD-10-CM | POA: Diagnosis not present

## 2023-09-05 DIAGNOSIS — M79661 Pain in right lower leg: Secondary | ICD-10-CM | POA: Diagnosis not present

## 2023-09-05 DIAGNOSIS — E559 Vitamin D deficiency, unspecified: Secondary | ICD-10-CM | POA: Diagnosis not present

## 2023-09-05 DIAGNOSIS — E119 Type 2 diabetes mellitus without complications: Secondary | ICD-10-CM | POA: Diagnosis not present

## 2023-09-05 DIAGNOSIS — E781 Pure hyperglyceridemia: Secondary | ICD-10-CM | POA: Diagnosis not present

## 2023-09-05 DIAGNOSIS — Z23 Encounter for immunization: Secondary | ICD-10-CM | POA: Diagnosis not present

## 2023-09-05 DIAGNOSIS — Z6832 Body mass index (BMI) 32.0-32.9, adult: Secondary | ICD-10-CM | POA: Diagnosis not present

## 2023-09-09 DIAGNOSIS — E6609 Other obesity due to excess calories: Secondary | ICD-10-CM | POA: Diagnosis not present

## 2023-09-09 DIAGNOSIS — E781 Pure hyperglyceridemia: Secondary | ICD-10-CM | POA: Diagnosis not present

## 2023-09-09 DIAGNOSIS — E7800 Pure hypercholesterolemia, unspecified: Secondary | ICD-10-CM | POA: Diagnosis not present

## 2023-09-09 DIAGNOSIS — M797 Fibromyalgia: Secondary | ICD-10-CM | POA: Diagnosis not present

## 2023-09-09 DIAGNOSIS — E559 Vitamin D deficiency, unspecified: Secondary | ICD-10-CM | POA: Diagnosis not present

## 2023-09-09 DIAGNOSIS — Z79899 Other long term (current) drug therapy: Secondary | ICD-10-CM | POA: Diagnosis not present

## 2023-09-09 DIAGNOSIS — E119 Type 2 diabetes mellitus without complications: Secondary | ICD-10-CM | POA: Diagnosis not present

## 2023-09-09 DIAGNOSIS — Z6832 Body mass index (BMI) 32.0-32.9, adult: Secondary | ICD-10-CM | POA: Diagnosis not present

## 2023-09-11 DIAGNOSIS — Z419 Encounter for procedure for purposes other than remedying health state, unspecified: Secondary | ICD-10-CM | POA: Diagnosis not present

## 2023-09-12 DIAGNOSIS — Z8679 Personal history of other diseases of the circulatory system: Secondary | ICD-10-CM | POA: Diagnosis not present

## 2023-09-12 DIAGNOSIS — I4719 Other supraventricular tachycardia: Secondary | ICD-10-CM | POA: Diagnosis not present

## 2023-09-12 DIAGNOSIS — R079 Chest pain, unspecified: Secondary | ICD-10-CM | POA: Diagnosis not present

## 2023-09-14 DIAGNOSIS — Z79899 Other long term (current) drug therapy: Secondary | ICD-10-CM | POA: Diagnosis not present

## 2023-09-15 ENCOUNTER — Other Ambulatory Visit (HOSPITAL_COMMUNITY): Payer: Medicaid Other | Attending: Oncology

## 2023-09-20 DIAGNOSIS — M542 Cervicalgia: Secondary | ICD-10-CM | POA: Diagnosis not present

## 2023-09-23 ENCOUNTER — Other Ambulatory Visit: Payer: Self-pay

## 2023-09-23 DIAGNOSIS — M797 Fibromyalgia: Secondary | ICD-10-CM | POA: Diagnosis not present

## 2023-09-23 DIAGNOSIS — Z6832 Body mass index (BMI) 32.0-32.9, adult: Secondary | ICD-10-CM | POA: Diagnosis not present

## 2023-09-23 DIAGNOSIS — E7800 Pure hypercholesterolemia, unspecified: Secondary | ICD-10-CM | POA: Diagnosis not present

## 2023-09-23 DIAGNOSIS — N3010 Interstitial cystitis (chronic) without hematuria: Secondary | ICD-10-CM | POA: Diagnosis not present

## 2023-09-23 DIAGNOSIS — E781 Pure hyperglyceridemia: Secondary | ICD-10-CM | POA: Diagnosis not present

## 2023-09-23 DIAGNOSIS — E559 Vitamin D deficiency, unspecified: Secondary | ICD-10-CM | POA: Diagnosis not present

## 2023-09-23 DIAGNOSIS — E6609 Other obesity due to excess calories: Secondary | ICD-10-CM | POA: Diagnosis not present

## 2023-09-23 DIAGNOSIS — E119 Type 2 diabetes mellitus without complications: Secondary | ICD-10-CM | POA: Diagnosis not present

## 2023-09-23 DIAGNOSIS — Z79899 Other long term (current) drug therapy: Secondary | ICD-10-CM | POA: Diagnosis not present

## 2023-09-26 DIAGNOSIS — Z8679 Personal history of other diseases of the circulatory system: Secondary | ICD-10-CM | POA: Diagnosis not present

## 2023-09-26 DIAGNOSIS — Z9889 Other specified postprocedural states: Secondary | ICD-10-CM | POA: Diagnosis not present

## 2023-09-26 DIAGNOSIS — R0602 Shortness of breath: Secondary | ICD-10-CM | POA: Diagnosis not present

## 2023-09-26 DIAGNOSIS — R079 Chest pain, unspecified: Secondary | ICD-10-CM | POA: Diagnosis not present

## 2023-09-26 DIAGNOSIS — Z79899 Other long term (current) drug therapy: Secondary | ICD-10-CM | POA: Diagnosis not present

## 2023-09-26 DIAGNOSIS — I4719 Other supraventricular tachycardia: Secondary | ICD-10-CM | POA: Diagnosis not present

## 2023-09-28 ENCOUNTER — Encounter: Payer: Self-pay | Admitting: Family

## 2023-09-28 NOTE — Telephone Encounter (Signed)
 Care team updated and letter sent for eye exam notes.

## 2023-09-29 DIAGNOSIS — Z6832 Body mass index (BMI) 32.0-32.9, adult: Secondary | ICD-10-CM | POA: Diagnosis not present

## 2023-09-29 DIAGNOSIS — F332 Major depressive disorder, recurrent severe without psychotic features: Secondary | ICD-10-CM | POA: Diagnosis not present

## 2023-09-29 DIAGNOSIS — Z1331 Encounter for screening for depression: Secondary | ICD-10-CM | POA: Diagnosis not present

## 2023-09-29 DIAGNOSIS — F411 Generalized anxiety disorder: Secondary | ICD-10-CM | POA: Diagnosis not present

## 2023-09-29 DIAGNOSIS — F4312 Post-traumatic stress disorder, chronic: Secondary | ICD-10-CM | POA: Diagnosis not present

## 2023-09-29 DIAGNOSIS — F515 Nightmare disorder: Secondary | ICD-10-CM | POA: Diagnosis not present

## 2023-10-12 DIAGNOSIS — Z419 Encounter for procedure for purposes other than remedying health state, unspecified: Secondary | ICD-10-CM | POA: Diagnosis not present

## 2023-10-18 ENCOUNTER — Other Ambulatory Visit: Payer: Self-pay | Admitting: Family

## 2023-10-18 DIAGNOSIS — Z794 Long term (current) use of insulin: Secondary | ICD-10-CM

## 2023-10-18 NOTE — Telephone Encounter (Signed)
 Schedule appointment. Last office visit 02/11/2023.

## 2023-10-19 DIAGNOSIS — N3010 Interstitial cystitis (chronic) without hematuria: Secondary | ICD-10-CM | POA: Diagnosis not present

## 2023-10-19 DIAGNOSIS — Z1211 Encounter for screening for malignant neoplasm of colon: Secondary | ICD-10-CM | POA: Diagnosis not present

## 2023-10-19 DIAGNOSIS — Z8679 Personal history of other diseases of the circulatory system: Secondary | ICD-10-CM | POA: Diagnosis not present

## 2023-10-24 DIAGNOSIS — E119 Type 2 diabetes mellitus without complications: Secondary | ICD-10-CM | POA: Diagnosis not present

## 2023-10-24 DIAGNOSIS — E7800 Pure hypercholesterolemia, unspecified: Secondary | ICD-10-CM | POA: Diagnosis not present

## 2023-10-24 DIAGNOSIS — Z79899 Other long term (current) drug therapy: Secondary | ICD-10-CM | POA: Diagnosis not present

## 2023-10-24 DIAGNOSIS — E559 Vitamin D deficiency, unspecified: Secondary | ICD-10-CM | POA: Diagnosis not present

## 2023-10-24 DIAGNOSIS — E781 Pure hyperglyceridemia: Secondary | ICD-10-CM | POA: Diagnosis not present

## 2023-10-24 DIAGNOSIS — Z6832 Body mass index (BMI) 32.0-32.9, adult: Secondary | ICD-10-CM | POA: Diagnosis not present

## 2023-10-24 DIAGNOSIS — M797 Fibromyalgia: Secondary | ICD-10-CM | POA: Diagnosis not present

## 2023-10-24 DIAGNOSIS — G629 Polyneuropathy, unspecified: Secondary | ICD-10-CM | POA: Diagnosis not present

## 2023-10-24 DIAGNOSIS — N3010 Interstitial cystitis (chronic) without hematuria: Secondary | ICD-10-CM | POA: Diagnosis not present

## 2023-10-24 DIAGNOSIS — R030 Elevated blood-pressure reading, without diagnosis of hypertension: Secondary | ICD-10-CM | POA: Diagnosis not present

## 2023-10-24 NOTE — Telephone Encounter (Signed)
 Schedule appointment. Last office visit 02/11/2023.

## 2023-10-25 NOTE — Telephone Encounter (Signed)
 Schedule appointment. Last office visit 02/11/2023.

## 2023-10-26 DIAGNOSIS — Z79899 Other long term (current) drug therapy: Secondary | ICD-10-CM | POA: Diagnosis not present

## 2023-10-26 NOTE — Telephone Encounter (Signed)
 Schedule appointment. Last office visit 02/11/2023.

## 2023-10-27 DIAGNOSIS — H44111 Panuveitis, right eye: Secondary | ICD-10-CM | POA: Diagnosis not present

## 2023-10-28 NOTE — Telephone Encounter (Signed)
Schedule appointment. Last office visit 02/11/2023.

## 2023-10-31 ENCOUNTER — Other Ambulatory Visit: Payer: Self-pay

## 2023-11-04 ENCOUNTER — Other Ambulatory Visit: Payer: Self-pay | Admitting: Ophthalmology

## 2023-11-04 DIAGNOSIS — E119 Type 2 diabetes mellitus without complications: Secondary | ICD-10-CM | POA: Diagnosis not present

## 2023-11-04 DIAGNOSIS — H44111 Panuveitis, right eye: Secondary | ICD-10-CM

## 2023-11-04 DIAGNOSIS — H5461 Unqualified visual loss, right eye, normal vision left eye: Secondary | ICD-10-CM

## 2023-11-04 DIAGNOSIS — Z1211 Encounter for screening for malignant neoplasm of colon: Secondary | ICD-10-CM | POA: Diagnosis not present

## 2023-11-12 DIAGNOSIS — Z419 Encounter for procedure for purposes other than remedying health state, unspecified: Secondary | ICD-10-CM | POA: Diagnosis not present

## 2023-11-16 DIAGNOSIS — J351 Hypertrophy of tonsils: Secondary | ICD-10-CM | POA: Diagnosis not present

## 2023-11-24 DIAGNOSIS — R5383 Other fatigue: Secondary | ICD-10-CM | POA: Diagnosis not present

## 2023-11-24 DIAGNOSIS — E6609 Other obesity due to excess calories: Secondary | ICD-10-CM | POA: Diagnosis not present

## 2023-11-24 DIAGNOSIS — E781 Pure hyperglyceridemia: Secondary | ICD-10-CM | POA: Diagnosis not present

## 2023-11-24 DIAGNOSIS — M797 Fibromyalgia: Secondary | ICD-10-CM | POA: Diagnosis not present

## 2023-11-24 DIAGNOSIS — E1139 Type 2 diabetes mellitus with other diabetic ophthalmic complication: Secondary | ICD-10-CM | POA: Diagnosis not present

## 2023-11-24 DIAGNOSIS — G629 Polyneuropathy, unspecified: Secondary | ICD-10-CM | POA: Diagnosis not present

## 2023-11-24 DIAGNOSIS — Z79899 Other long term (current) drug therapy: Secondary | ICD-10-CM | POA: Diagnosis not present

## 2023-11-24 DIAGNOSIS — E559 Vitamin D deficiency, unspecified: Secondary | ICD-10-CM | POA: Diagnosis not present

## 2023-11-24 DIAGNOSIS — N63 Unspecified lump in unspecified breast: Secondary | ICD-10-CM | POA: Diagnosis not present

## 2023-11-24 DIAGNOSIS — E7800 Pure hypercholesterolemia, unspecified: Secondary | ICD-10-CM | POA: Diagnosis not present

## 2023-12-02 DIAGNOSIS — N6321 Unspecified lump in the left breast, upper outer quadrant: Secondary | ICD-10-CM | POA: Diagnosis not present

## 2023-12-05 DIAGNOSIS — N641 Fat necrosis of breast: Secondary | ICD-10-CM | POA: Diagnosis not present

## 2023-12-05 DIAGNOSIS — N6321 Unspecified lump in the left breast, upper outer quadrant: Secondary | ICD-10-CM | POA: Diagnosis not present

## 2023-12-10 DIAGNOSIS — Z419 Encounter for procedure for purposes other than remedying health state, unspecified: Secondary | ICD-10-CM | POA: Diagnosis not present

## 2023-12-22 DIAGNOSIS — B3731 Acute candidiasis of vulva and vagina: Secondary | ICD-10-CM | POA: Diagnosis not present

## 2023-12-22 DIAGNOSIS — E6609 Other obesity due to excess calories: Secondary | ICD-10-CM | POA: Diagnosis not present

## 2023-12-22 DIAGNOSIS — E1139 Type 2 diabetes mellitus with other diabetic ophthalmic complication: Secondary | ICD-10-CM | POA: Diagnosis not present

## 2023-12-22 DIAGNOSIS — Z79899 Other long term (current) drug therapy: Secondary | ICD-10-CM | POA: Diagnosis not present

## 2023-12-22 DIAGNOSIS — M797 Fibromyalgia: Secondary | ICD-10-CM | POA: Diagnosis not present

## 2023-12-22 DIAGNOSIS — G629 Polyneuropathy, unspecified: Secondary | ICD-10-CM | POA: Diagnosis not present

## 2023-12-22 DIAGNOSIS — E559 Vitamin D deficiency, unspecified: Secondary | ICD-10-CM | POA: Diagnosis not present

## 2023-12-22 DIAGNOSIS — E781 Pure hyperglyceridemia: Secondary | ICD-10-CM | POA: Diagnosis not present

## 2023-12-22 DIAGNOSIS — N3010 Interstitial cystitis (chronic) without hematuria: Secondary | ICD-10-CM | POA: Diagnosis not present

## 2023-12-29 ENCOUNTER — Ambulatory Visit: Admitting: "Endocrinology

## 2023-12-29 ENCOUNTER — Encounter: Payer: Self-pay | Admitting: "Endocrinology

## 2023-12-29 ENCOUNTER — Other Ambulatory Visit: Payer: Self-pay

## 2023-12-29 VITALS — BP 130/80 | HR 97 | Ht 64.0 in | Wt 186.0 lb

## 2023-12-29 DIAGNOSIS — Z7984 Long term (current) use of oral hypoglycemic drugs: Secondary | ICD-10-CM | POA: Diagnosis not present

## 2023-12-29 DIAGNOSIS — E7800 Pure hypercholesterolemia, unspecified: Secondary | ICD-10-CM | POA: Diagnosis not present

## 2023-12-29 DIAGNOSIS — Z7985 Long-term (current) use of injectable non-insulin antidiabetic drugs: Secondary | ICD-10-CM

## 2023-12-29 DIAGNOSIS — Z794 Long term (current) use of insulin: Secondary | ICD-10-CM | POA: Diagnosis not present

## 2023-12-29 DIAGNOSIS — E1165 Type 2 diabetes mellitus with hyperglycemia: Secondary | ICD-10-CM | POA: Diagnosis not present

## 2023-12-29 MED ORDER — DEXCOM G7 SENSOR MISC: 1.0000 | 1 refills | Status: DC

## 2023-12-29 MED ORDER — TIRZEPATIDE 5 MG/0.5ML ~~LOC~~ SOAJ: 5.0000 mg | SUBCUTANEOUS | 0 refills | Status: DC

## 2023-12-29 MED ORDER — BAQSIMI ONE PACK 3 MG/DOSE NA POWD: 1.0000 | NASAL | 3 refills | Status: AC | PRN

## 2023-12-29 NOTE — Progress Notes (Signed)
 Outpatient Endocrinology Note Michelle Trooper, MD  12/29/23   Michelle Gibson 02/15/1976 [id number]  Referring Provider: Theodosia Gibson* Primary Care Provider: Rema Fendt, NP Reason for consultation: Subjective   Assessment & Plan  Diagnoses and all orders for this visit:  Uncontrolled type 2 diabetes mellitus with hyperglycemia (HCC) -     Ambulatory referral to diabetic education  Long-term insulin use (HCC)  Long-term (current) use of injectable non-insulin antidiabetic drugs  Long term (current) use of oral hypoglycemic drugs  Pure hypercholesterolemia  Other orders -     tirzepatide (MOUNJARO) 5 MG/0.5ML Pen; Inject 5 mg into the skin once a week. -     Continuous Glucose Sensor (DEXCOM G7 SENSOR) MISC; 1 Device by Does not apply route continuous. -     Glucagon (BAQSIMI ONE PACK) 3 MG/DOSE POWD; Place 1 Device into the nose as needed (Low blood sugar with impaired consciousness).    Diabetes Type II complicated by neuropathy, retinopathy, No results found for: "GFR" Hba1c goal less than 7, current Hba1c is  Lab Results  Component Value Date   HGBA1C 8.0 (A) 12/29/2022   Will recommend the following: Lantus 35 units qam Humalog 20 units after meals Mounjaro 2.5mg /week Metformin 500mg  every day , increase by 500 mg weekly until reached 1000 mg bid   No known contraindications/side effects to any of above medications Glucagon discussed and prescribed with refills on 12/29/23  -Last LD and Tg are as follows: No results found for: "LDLCALC"  Lab Results  Component Value Date   TRIG 494 (H) 03/09/2021   -On atorvastatin 20 mg every day, started in 12/3023  -Follow low fat diet and exercise   -Blood pressure goal <140/90 - Microalbumin/creatinine goal is < 30 -Last MA/Cr is as follows: No results found for: "MICROALBUR", "MALB24HUR" -not on ACE/ARB  -diet changes including salt restriction -limit eating outside -counseled BP targets  per standards of diabetes care -uncontrolled blood pressure can lead to retinopathy, nephropathy and cardiovascular and atherosclerotic heart disease  Reviewed and counseled on: -A1C target -Blood sugar targets -Complications of uncontrolled diabetes  -Checking blood sugar before meals and bedtime and bring log next visit -All medications with mechanism of action and side effects -Hypoglycemia management: rule of 15's, Glucagon Emergency Kit and medical alert ID -low-carb low-fat plate-method diet -At least 20 minutes of physical activity per day -Annual dilated retinal eye exam and foot exam -compliance and follow up needs -follow up as scheduled or earlier if problem gets worse  Call if blood sugar is less than 70 or consistently above 250    Take a 15 gm snack of carbohydrate at bedtime before you go to sleep if your blood sugar is less than 100.    If you are going to fast after midnight for a test or procedure, ask your physician for instructions on how to reduce/decrease your insulin dose.    Call if blood sugar is less than 70 or consistently above 250  -Treating a low sugar by rule of 15  (15 gms of sugar every 15 min until sugar is more than 70) If you feel your sugar is low, test your sugar to be sure If your sugar is low (less than 70), then take 15 grams of a fast acting Carbohydrate (3-4 glucose tablets or glucose gel or 4 ounces of juice or regular soda) Recheck your sugar 15 min after treating low to make sure it is more than 70 If sugar  is still less than 70, treat again with 15 grams of carbohydrate          Don't drive the hour of hypoglycemia  If unconscious/unable to eat or drink by mouth, use glucagon injection or nasal spray baqsimi and call 911. Can repeat again in 15 min if still unconscious.  Return in about 4 weeks (around 01/26/2024).   I have reviewed current medications, nurse's notes, allergies, vital signs, past medical and surgical history, family  medical history, and social history for this encounter. Counseled patient on symptoms, examination findings, lab findings, imaging results, treatment decisions and monitoring and prognosis. The patient understood the recommendations and agrees with the treatment plan. All questions regarding treatment plan were fully answered.  Michelle Hosmer, MD  12/29/23    History of Present Illness Michelle Gibson is a 48 y.o. year old female who presents for evaluation of Type II diabetes mellitus.  Michelle Gibson was first diagnosed in 81s.   Diabetes education +  Home diabetes regimen: Lantus 35 units qam Humalog 20 units after meals Mounjaro 2.5mg /week Metformin 500mg  every day     COMPLICATIONS -  MI/Stroke +  retinopathy +  neuropathy -  nephropathy  SYMPTOMS REVIEWED + Polyuria + Weight loss + Blurred vision  BLOOD SUGAR DATA Did not bring meter 246-HI per recall  11/24/2023 Mg 1.8 TG 402 CHOL 298 No LDL A1c 10.5 GFR 7 6 AST 2 3 ALT 23  Physical Exam  BP 130/80   Pulse 97   Ht 5\' 4"  (1.626 m)   Wt 186 lb (84.4 kg)   SpO2 96%   BMI 31.93 kg/m    Constitutional: well developed, well nourished Head: normocephalic, atraumatic Eyes: sclera anicteric, no redness Neck: supple Lungs: normal respiratory effort Neurology: alert and oriented Skin: dry, no appreciable rashes Musculoskeletal: no appreciable defects Psychiatric: normal mood and affect Diabetic Foot Exam - Simple   No data filed      Current Medications Patient's Medications  New Prescriptions   CONTINUOUS GLUCOSE SENSOR (DEXCOM G7 SENSOR) MISC    1 Device by Does not apply route continuous.   GLUCAGON (BAQSIMI ONE PACK) 3 MG/DOSE POWD    Place 1 Device into the nose as needed (Low blood sugar with impaired consciousness).   TIRZEPATIDE (MOUNJARO) 5 MG/0.5ML PEN    Inject 5 mg into the skin once a week.  Previous Medications   ACCU-CHEK SOFTCLIX LANCETS LANCETS    Use 4 (four) times daily -   before meals and at bedtime.   ATORVASTATIN (LIPITOR) 20 MG TABLET    Take 1 tablet (20 mg total) by mouth daily.   BENZONATATE (TESSALON) 100 MG CAPSULE    Take 1 capsule (100 mg total) by mouth every 8 (eight) hours as needed for cough.   BENZONATATE (TESSALON) 100 MG CAPSULE    Take 1 capsule (100 mg total) by mouth every 8 (eight) hours.   BLOOD GLUCOSE MONITORING SUPPL (ACCU-CHEK GUIDE ME) W/DEVICE KIT    Use as directed 4 (four) times daily -  before meals and at bedtime.   BLOOD GLUCOSE MONITORING SUPPL (ONETOUCH VERIO) W/DEVICE KIT    Use to check blood sugar TID.   CONTINUOUS BLOOD GLUC TRANSMIT (DEXCOM G6 TRANSMITTER) MISC    1 each by Does not apply route 4 (four) times daily -  before meals and at bedtime.   CYCLOBENZAPRINE (FLEXERIL) 5 MG TABLET    TAKE 1 TABLET (5 MG TOTAL) BY MOUTH 2 (TWO) TIMES  DAILY AS NEEDED FOR MUSCLE SPASMS.   FLUCONAZOLE (DIFLUCAN) 150 MG TABLET    Take 1 tablet by mouth once. Repeat in 3 days if needed.   FLUOXETINE (PROZAC) 10 MG CAPSULE    Take 2 capsules (20 mg total) by mouth at bedtime.   FLUTICASONE (FLONASE) 50 MCG/ACT NASAL SPRAY    Place 1 spray into both nostrils daily.   FLUTICASONE (FLONASE) 50 MCG/ACT NASAL SPRAY    Place 1 spray into both nostrils daily.   GABAPENTIN (NEURONTIN) 300 MG CAPSULE    Take 2 capsules (600 mg total) by mouth 4 (four) times daily.   GLUCOSE BLOOD (TRUE METRIX BLOOD GLUCOSE TEST) TEST STRIP    Use 4 (four) times daily -  before meals and at bedtime. Use as instructed   HYDROXYZINE (VISTARIL) 25 MG CAPSULE    Take 1 capsule (25 mg total) by mouth every 8 (eight) hours as needed.   INSULIN GLARGINE (LANTUS) 100 UNIT/ML SOLOSTAR PEN    Inject 34 units into the skin once daily. After 1 week, if fasting blood sugar > 200, increase to 40 units in the morning.   INSULIN LISPRO (HUMALOG) 100 UNIT/ML KWIKPEN    Inject 10 Units into the skin 3 (three) times daily. If blood sugar before you eat is under 150, do not take.   INSULIN  PEN NEEDLE 32G X 4 MM MISC    Use a total of 4 pen needles a day.   IPRATROPIUM-ALBUTEROL (DUONEB) 0.5-2.5 (3) MG/3ML SOLN    Take 3 mLs by nebulization every 6 (six) hours as needed (wheezing).   METFORMIN (GLUCOPHAGE) 1000 MG TABLET    Take 1 tablet (1,000 mg total) by mouth 2 (two) times daily with a meal.   NITROFURANTOIN, MACROCRYSTAL-MONOHYDRATE, (MACROBID) 100 MG CAPSULE    Take 1 capsule (100 mg total) by mouth 2 (two) times daily.   NYSTATIN CREAM (MYCOSTATIN)    Apply topically 2 (two) times daily as needed (apply to rash).   ONDANSETRON (ZOFRAN-ODT) 4 MG DISINTEGRATING TABLET    Place 1 tab under the tongue every 4 hours as needed for nausea and vomitting   ONETOUCH DELICA LANCETS 33G MISC    Use to check blood sugar TID.   OSELTAMIVIR (TAMIFLU) 75 MG CAPSULE    Take 1 capsule (75 mg total) by mouth every 12 (twelve) hours.   OXYCODONE-ACETAMINOPHEN (PERCOCET) 5-325 MG TABLET    Take 1 tablet by mouth every 6 (six) hours as needed.   SUMATRIPTAN (IMITREX) 25 MG TABLET    Take 25 mg (1 tablet) total by mouth at the start of the headache. May repeat in 2 hours x 1 if headache persists. Max of 2 tabs/24 hours   TAMSULOSIN (FLOMAX) 0.4 MG CAPS CAPSULE    Take 1 capsule (0.4 mg total) by mouth daily.   TOPIRAMATE (TOPAMAX) 50 MG TABLET    Take 1 tablet (50 mg total) by mouth daily as needed (migraine).   TRAMADOL (ULTRAM) 50 MG TABLET    Take 1 tablet (50 mg total) by mouth 3 (three) times daily as needed.  Modified Medications   No medications on file  Discontinued Medications   CONTINUOUS GLUCOSE SENSOR (FREESTYLE LIBRE 2 SENSOR) MISC    Use to check blood sugar continuously.   MOUNJARO 2.5 MG/0.5ML PEN    2.5 MG SUBCUTANEOUSLY EVERY WEEK FOR 4 WEEKS    Allergies Allergies  Allergen Reactions   Ciprofloxacin Hives   Lyrica Cr [Pregabalin Er] Other (See Comments)  Suicidal ideation   Doxycycline Rash    Yeast infection   Flecainide Hives, Nausea And Vomiting and Rash   Latex  Rash and Other (See Comments)    Causes blisters, also   Penicillin G Rash    Has patient had a PCN reaction causing immediate rash, facial/tongue/throat swelling, SOB or lightheadedness with hypotension: yes Has patient had a PCN reaction causing severe rash involving mucus membranes or skin necrosis: yes Has patient had a PCN reaction that required hospitalization: no Has patient had a PCN reaction occurring within the last 10 years: no If all of the above answers are "NO", then may proceed with Cephalosporin use.     Past Medical History Past Medical History:  Diagnosis Date   Acute tear lateral meniscus, right, initial encounter 08/29/2019   Asthma    Closed fracture of upper end of fibula 09/21/2018   Diabetes mellitus without complication (HCC)    TYPE II   Hyperglycemia 09/15/2016   Migraines    WPW (Wolff-Parkinson-White syndrome)    s/p ablation at age 22    Past Surgical History Past Surgical History:  Procedure Laterality Date   ABDOMINAL HYSTERECTOMY     CARDIAC SURGERY     CESAREAN SECTION     KNEE ARTHROSCOPY WITH LATERAL MENISECTOMY Right 08/29/2019   Procedure: PARTIAL LATERAL MENISECTOMY;  Surgeon: Tarry Kos, MD;  Location: [facility];  Service: Orthopedics;  Laterality: Right;   KNEE ARTHROSCOPY WITH MEDIAL MENISECTOMY Right 08/29/2019   Procedure: RIGHT KNEE ARTHROSCOPY WITH PARTIAL MEDIAL MENISECTOMY;  Surgeon: Tarry Kos, MD;  Location: [facility];  Service: Orthopedics;  Laterality: Right;    Family History family history includes CAD (age of onset: 25) in her father and mother; Diabetes in her mother; Hypertension in her father and mother.  Social History Social History   Socioeconomic History   Marital status: Divorced    Spouse name: Not on file   Number of children: Not on file   Years of education: Not on file   Highest education level: 8th grade  Occupational History   Not on file  Tobacco Use   Smoking  status: Some Days    Current packs/day: 0.10    Types: Cigarettes    Passive exposure: Current   Smokeless tobacco: Former  Building services engineer status: Never Used  Substance and Sexual Activity   Alcohol use: No   Drug use: No   Sexual activity: Not Currently    Birth control/protection: Surgical  Other Topics Concern   Not on file  Social History Narrative   Right Handed    Lives in a mobile home. Lives with daughter    Social Drivers of Health   Financial Resource Strain: High Risk (02/15/2023)   Overall Financial Resource Strain (CARDIA)    Difficulty of Paying Living Expenses: Very hard  Food Insecurity: Food Insecurity Present (02/15/2023)   Hunger Vital Sign    Worried About Running Out of Food in the Last Year: Often true    Ran Out of Food in the Last Year: Often true  Transportation Needs: Unmet Transportation Needs (02/15/2023)   PRAPARE - Administrator, Civil Service (Medical): Yes    Lack of Transportation (Non-Medical): Yes  Physical Activity: Sufficiently Active (02/15/2023)   Exercise Vital Sign    Days of Exercise per Week: 5 days    Minutes of Exercise per Session: 30 min  Stress: Stress Concern Present (02/15/2023)  [employer] of Occupational Health - Occupational Stress Questionnaire    Feeling of Stress : Very much  Social Connections: Socially Isolated (02/15/2023)   Social Connection and Isolation Panel [NHANES]    Frequency of Communication with Friends and Family: Never    Frequency of Social Gatherings with Friends and Family: Never    Attends Religious Services: Never    Database administrator or Organizations: No    Attends Engineer, structural: Not on file    Marital Status: Divorced  Intimate Partner Violence: Not At Risk (02/15/2023)   Humiliation, Afraid, Rape, and Kick questionnaire    Fear of Current or Ex-Partner: No    Emotionally Abused: No    Physically Abused: No    Sexually Abused: No    Lab Results   Component Value Date   HGBA1C 8.0 (A) 12/29/2022   HGBA1C 8.5 (A) 08/18/2021   HGBA1C 10.1 (H) 06/08/2021   No results found for: "CHOL" No results found for: "HDL" No results found for: "LDLCALC" Lab Results  Component Value Date   TRIG 494 (H) 03/09/2021   No results found for: "CHOLHDL" Lab Results  Component Value Date   CREATININE 0.82 01/05/2023   No results found for: "GFR" No results found for: "MICROALBUR", "MALB24HUR"    Component Value Date/Time   NA 134 (L) 01/05/2023 1621   NA 141 08/18/2021 1417   K 3.7 01/05/2023 1621   CL 98 01/05/2023 1621   CO2 27 01/05/2023 1621   GLUCOSE 139 (H) 01/05/2023 1621   BUN 20 01/05/2023 1621   BUN 18 08/18/2021 1417   CREATININE 0.82 01/05/2023 1621   CREATININE 1.00 10/07/2016 1506   CALCIUM 9.2 01/05/2023 1621   PROT 7.0 10/22/2021 0813   PROT 8.0 08/18/2021 1417   ALBUMIN 3.7 10/22/2021 0813   ALBUMIN 5.0 (H) 08/18/2021 1417   AST 19 10/22/2021 0813   ALT 29 10/22/2021 0813   ALKPHOS 98 10/22/2021 0813   BILITOT 0.3 10/22/2021 0813   BILITOT <0.2 08/18/2021 1417   GFRNONAA >60 01/05/2023 1621   GFRAA >60 07/12/2020 1046      Latest Ref Rng & Units 01/05/2023    4:21 PM 07/01/2022    3:25 PM 10/22/2021    8:13 AM  BMP  Glucose 70 - 99 mg/dL 518  841  660   BUN 6 - 20 mg/dL 20  14  12    Creatinine 0.44 - 1.00 mg/dL 6.30  1.60  1.09   Sodium 135 - 145 mmol/L 134  137  139   Potassium 3.5 - 5.1 mmol/L 3.7  4.1  3.6   Chloride 98 - 111 mmol/L 98  100  101   CO2 22 - 32 mmol/L 27  25  24    Calcium 8.9 - 10.3 mg/dL 9.2  9.4  9.4        Component Value Date/Time   WBC 9.8 01/05/2023 1621   RBC 4.87 01/05/2023 1621   HGB 14.3 01/05/2023 1621   HGB 14.7 08/18/2021 1417   HCT 41.6 01/05/2023 1621   HCT 44.1 08/18/2021 1417   PLT 336 01/05/2023 1621   PLT 430 08/18/2021 1417   MCV 85.4 01/05/2023 1621   MCV 87 08/18/2021 1417   MCH 29.4 01/05/2023 1621   MCHC 34.4 01/05/2023 1621   RDW 11.9 01/05/2023 1621    RDW 13.1 08/18/2021 1417   LYMPHSABS 3.5 01/05/2023 1621   MONOABS 0.4 01/05/2023 1621   EOSABS 0.2 01/05/2023 1621  BASOSABS 0.1 01/05/2023 1621     Parts of this note may have been dictated using voice recognition software. There may be variances in spelling and vocabulary which are unintentional. Not all errors are proofread. Please notify the Thereasa Parkin if any discrepancies are noted or if the meaning of any statement is not clear.

## 2023-12-29 NOTE — Patient Instructions (Addendum)
 We will start you on metformin. If you do have upset stomach, decrease it back to the previous dose that you tolerated.  Week 1: one tablet after breakfast and one after dinner  Week 2: one tablet after breakfast and two after dinner  Week 3 and onwards: two tablets after breakfast and two after dinner  Total dose 2000 mg per day in divided doses   _________________   Goals of DM therapy:  Morning Fasting blood sugar: 80-140  Blood sugar before meals: 80-140 Bed time blood sugar: 100-150  A1C <7%, limited only by hypoglycemia  1.Diabetes medications and their side effects discussed, including hypoglycemia    2. Check blood glucose:  a) Always check blood sugars before driving. Please see below (under hypoglycemia) on how to manage b) Check a minimum of 3 times/day or more as needed when having symptoms of hypoglycemia.   c) Try to check blood glucose before sleeping/in the middle of the night to ensure that it is remaining stable and not dropping less than 100 d) Check blood glucose more often if sick  3. Diet: a) 3 meals per day schedule b: Restrict carbs to 60-70 grams (4 servings) per meal c) Colorful vegetables - 3 servings a day, and low sugar fruit 2 servings/day Plate control method: 1/4 plate protein, 1/4 starch, 1/2 green, yellow, or red vegetables d) Avoid carbohydrate snacks unless hypoglycemic episode, or increased physical activity  4. Regular exercise as tolerated, preferably 3 or more hours a week  5. Hypoglycemia: a)  Do not drive or operate machinery without first testing blood glucose to assure it is over 90 mg%, or if dizzy, lightheaded, not feeling normal, etc, or  if foot or leg is numb or weak. b)  If blood glucose less than 70, take four 5gm Glucose tabs or 15-30 gm Glucose gel.  Repeat every 15 min as needed until blood sugar is >100 mg/dl. If hypoglycemia persists then call 911.   6. Sick day management: a) Check blood glucose more often b) Continue  usual therapy if blood sugars are elevated.   7. Contact the doctor immediately if blood glucose is frequently <60 mg/dl, or an episode of severe hypoglycemia occurs (where someone had to give you glucose/  glucagon or if you passed out from a low blood glucose), or if blood glucose is persistently >350 mg/dl, for further management  8. A change in level of physical activity or exercise and a change in diet may also affect your blood sugar. Check blood sugars more often and call if needed.  Instructions: 1. Bring glucose meter, blood glucose records on every visit for review 2. Continue to follow up with primary care physician and other providers for medical care 3. Yearly eye  and foot exam 4. Please get blood work done prior to the next appointment

## 2023-12-30 ENCOUNTER — Telehealth: Payer: Self-pay

## 2023-12-30 ENCOUNTER — Other Ambulatory Visit (HOSPITAL_COMMUNITY): Payer: Self-pay

## 2023-12-30 DIAGNOSIS — Z79899 Other long term (current) drug therapy: Secondary | ICD-10-CM | POA: Diagnosis not present

## 2023-12-30 NOTE — Telephone Encounter (Signed)
 Pharmacy Patient Advocate Encounter   Received notification from CoverMyMeds that prior authorization for Dexcom G7 sensor is required/requested.   Insurance verification completed.   The patient is insured through [facility] .   Per test claim: PA required; PA submitted to above mentioned insurance via CoverMyMeds Key/confirmation #/EOC [facility] Status is pending

## 2023-12-31 ENCOUNTER — Other Ambulatory Visit: Payer: Self-pay | Admitting: "Endocrinology

## 2024-01-03 ENCOUNTER — Other Ambulatory Visit: Payer: Medicaid Other

## 2024-01-03 NOTE — Telephone Encounter (Signed)
 Pharmacy Patient Advocate Encounter  Received notification from [facility] that Prior Authorization for  Dexcom G7 sensor has been APPROVED from 12-16-2023 to 06-27-2024   PA #/Case ID/Reference #: [id number]

## 2024-01-06 ENCOUNTER — Ambulatory Visit: Payer: Medicaid Other | Admitting: "Endocrinology

## 2024-01-10 ENCOUNTER — Other Ambulatory Visit: Payer: Self-pay

## 2024-01-10 MED ORDER — DEXCOM G7 SENSOR MISC
1.0000 | 1 refills | Status: AC
  Filled 2024-01-10: qty 9, 90d supply, fill #0

## 2024-01-11 ENCOUNTER — Other Ambulatory Visit: Payer: Self-pay

## 2024-01-20 DIAGNOSIS — E6609 Other obesity due to excess calories: Secondary | ICD-10-CM | POA: Diagnosis not present

## 2024-01-20 DIAGNOSIS — M797 Fibromyalgia: Secondary | ICD-10-CM | POA: Diagnosis not present

## 2024-01-20 DIAGNOSIS — Z6832 Body mass index (BMI) 32.0-32.9, adult: Secondary | ICD-10-CM | POA: Diagnosis not present

## 2024-01-20 DIAGNOSIS — G629 Polyneuropathy, unspecified: Secondary | ICD-10-CM | POA: Diagnosis not present

## 2024-01-20 DIAGNOSIS — Z79899 Other long term (current) drug therapy: Secondary | ICD-10-CM | POA: Diagnosis not present

## 2024-01-21 DIAGNOSIS — Z419 Encounter for procedure for purposes other than remedying health state, unspecified: Secondary | ICD-10-CM | POA: Diagnosis not present

## 2024-01-23 ENCOUNTER — Ambulatory Visit: Admitting: "Endocrinology

## 2024-01-24 ENCOUNTER — Telehealth: Admitting: Physician Assistant

## 2024-01-24 DIAGNOSIS — B029 Zoster without complications: Secondary | ICD-10-CM | POA: Diagnosis not present

## 2024-01-25 DIAGNOSIS — Z79899 Other long term (current) drug therapy: Secondary | ICD-10-CM | POA: Diagnosis not present

## 2024-01-25 MED ORDER — VALACYCLOVIR HCL 1 G PO TABS: 1000.0000 mg | ORAL_TABLET | Freq: Three times a day (TID) | ORAL | 0 refills | Status: AC

## 2024-01-25 NOTE — Progress Notes (Signed)
 E-visit for Shingles   We are sorry that you are not feeling well. Here is how we plan to help!  Based on what you shared with me it looks like you have shingles.  Shingles or herpes zoster, is a common infection of the nerves.  It is a painful rash caused by the herpes zoster virus.  This is the same virus that causes chickenpox. While in the same family of viruses (there are many), this is not the virus that causes cold sores (herpes simplex I) or genital herpes (herpes simplex 2) and is not an STI.  After a person has chickenpox, the virus remains inactive in the nerve cells.  Years later, the virus can become active again and travel to the skin.  It typically will appear on one side of the face or body.  Burning or shooting pain, tingling, or itching are early signs of the infection.  Blisters typically scab over in 7 to 10 days and clear up within 2-4 weeks. Shingles is only contagious to people that have never had the chickenpox, the chickenpox vaccine, or anyone who has a compromised immune system.  You should avoid contact with these type of people until your blisters scab over.  I have prescribed Valacyclovir 1g three times daily for 7 days.   HOME CARE: Apply ice packs (wrapped in a thin towel), cool compresses, or soak in cool bath to help reduce pain. Use calamine lotion to calm itchy skin. Avoid scratching the rash. Avoid direct sunlight.  GET HELP RIGHT AWAY IF: Symptoms that don't away after treatment. A rash or blisters near your eye. Increased drainage, fever, or rash after treatment. Severe pain that doesn't go away.   MAKE SURE YOU   Understand these instructions. Will watch your condition. Will get help right away if you are not doing well or get worse.  Thank you for choosing an e-visit.  Your e-visit answers were reviewed by a board certified advanced clinical practitioner to complete your personal care plan. Depending upon the condition, your plan could have  included both over the counter or prescription medications.  Please review your pharmacy choice. Make sure the pharmacy is open so you can pick up prescription now. If there is a problem, you may contact your provider through [employer] and have the prescription routed to another pharmacy.  Your safety is important to us . If you have drug allergies check your prescription carefully.   For the next 24 hours you can use MyChart to ask questions about today's visit, request a non-urgent call back, or ask for a work or school excuse. You will get an email in the next two days asking about your experience. I hope that your e-visit has been valuable and will speed your recovery.

## 2024-01-25 NOTE — Progress Notes (Signed)
 I have spent 5 minutes in review of e-visit questionnaire, review and updating patient chart, medical decision making and response to patient.   Piedad Climes, PA-C

## 2024-01-26 ENCOUNTER — Encounter (HOSPITAL_BASED_OUTPATIENT_CLINIC_OR_DEPARTMENT_OTHER): Payer: Self-pay | Admitting: Emergency Medicine

## 2024-01-26 ENCOUNTER — Emergency Department (HOSPITAL_BASED_OUTPATIENT_CLINIC_OR_DEPARTMENT_OTHER)

## 2024-01-26 ENCOUNTER — Emergency Department (HOSPITAL_BASED_OUTPATIENT_CLINIC_OR_DEPARTMENT_OTHER)
Admission: EM | Admit: 2024-01-26 | Discharge: 2024-01-26 | Disposition: A | Discharge status: Home / Self Care | Attending: Emergency Medicine | Admitting: Emergency Medicine

## 2024-01-26 ENCOUNTER — Other Ambulatory Visit: Payer: Self-pay

## 2024-01-26 DIAGNOSIS — Z91040 Latex allergy status: Secondary | ICD-10-CM | POA: Insufficient documentation

## 2024-01-26 DIAGNOSIS — R1012 Left upper quadrant pain: Secondary | ICD-10-CM | POA: Diagnosis not present

## 2024-01-26 DIAGNOSIS — Z794 Long term (current) use of insulin: Secondary | ICD-10-CM | POA: Insufficient documentation

## 2024-01-26 DIAGNOSIS — K838 Other specified diseases of biliary tract: Secondary | ICD-10-CM | POA: Diagnosis not present

## 2024-01-26 DIAGNOSIS — Z7984 Long term (current) use of oral hypoglycemic drugs: Secondary | ICD-10-CM | POA: Insufficient documentation

## 2024-01-26 DIAGNOSIS — K5730 Diverticulosis of large intestine without perforation or abscess without bleeding: Secondary | ICD-10-CM | POA: Diagnosis not present

## 2024-01-26 DIAGNOSIS — R9431 Abnormal electrocardiogram [ECG] [EKG]: Secondary | ICD-10-CM | POA: Diagnosis not present

## 2024-01-26 DIAGNOSIS — E119 Type 2 diabetes mellitus without complications: Secondary | ICD-10-CM | POA: Diagnosis not present

## 2024-01-26 DIAGNOSIS — R21 Rash and other nonspecific skin eruption: Secondary | ICD-10-CM | POA: Insufficient documentation

## 2024-01-26 DIAGNOSIS — R160 Hepatomegaly, not elsewhere classified: Secondary | ICD-10-CM | POA: Diagnosis not present

## 2024-01-26 LAB — CBC WITH DIFFERENTIAL/PLATELET
Abs Immature Granulocytes: 0.02 10*3/uL (ref 0.00–0.07)
Basophils Absolute: 0.1 10*3/uL (ref 0.0–0.1)
Basophils Relative: 1 %
Eosinophils Absolute: 0.3 10*3/uL (ref 0.0–0.5)
Eosinophils Relative: 4 %
HCT: 41.8 % (ref 36.0–46.0)
Hemoglobin: 14.7 g/dL (ref 12.0–15.0)
Immature Granulocytes: 0 %
Lymphocytes Relative: 54 %
Lymphs Abs: 4 10*3/uL (ref 0.7–4.0)
MCH: 29.8 pg (ref 26.0–34.0)
MCHC: 35.2 g/dL (ref 30.0–36.0)
MCV: 84.8 fL (ref 80.0–100.0)
Monocytes Absolute: 0.5 10*3/uL (ref 0.1–1.0)
Monocytes Relative: 7 %
Neutro Abs: 2.5 10*3/uL (ref 1.7–7.7)
Neutrophils Relative %: 34 %
Platelets: 377 10*3/uL (ref 150–400)
RBC: 4.93 MIL/uL (ref 3.87–5.11)
RDW: 12 % (ref 11.5–15.5)
WBC: 7.3 10*3/uL (ref 4.0–10.5)
nRBC: 0 % (ref 0.0–0.2)

## 2024-01-26 LAB — TROPONIN I (HIGH SENSITIVITY): Troponin I (High Sensitivity): 3 ng/L (ref <18)

## 2024-01-26 LAB — URINALYSIS, ROUTINE W REFLEX MICROSCOPIC
Glucose, UA: NEGATIVE mg/dL
Hgb urine dipstick: NEGATIVE
Ketones, ur: 15 mg/dL — AB
Leukocytes,Ua: NEGATIVE
Nitrite: NEGATIVE
Protein, ur: 30 mg/dL — AB
Specific Gravity, Urine: >=1.03 (ref 1.005–1.030)
pH: 5.5 (ref 5.0–8.0)

## 2024-01-26 LAB — URINALYSIS, MICROSCOPIC (REFLEX)

## 2024-01-26 LAB — COMPREHENSIVE METABOLIC PANEL WITH GFR
ALT: 30 U/L (ref 0–44)
AST: 30 U/L (ref 15–41)
Albumin: 4.1 g/dL (ref 3.5–5.0)
Alkaline Phosphatase: 92 U/L (ref 38–126)
Anion gap: 12 (ref 5–15)
BUN: 13 mg/dL (ref 6–20)
CO2: 27 mmol/L (ref 22–32)
Calcium: 9.6 mg/dL (ref 8.9–10.3)
Chloride: 99 mmol/L (ref 98–111)
Creatinine, Ser: 0.8 mg/dL (ref 0.44–1.00)
GFR, Estimated: >60 mL/min (ref >60)
Glucose, Bld: 115 mg/dL — ABNORMAL HIGH (ref 70–99)
Potassium: 3.5 mmol/L (ref 3.5–5.1)
Sodium: 138 mmol/L (ref 135–145)
Total Bilirubin: 0.7 mg/dL (ref 0.0–1.2)
Total Protein: 8.2 g/dL — ABNORMAL HIGH (ref 6.5–8.1)

## 2024-01-26 LAB — LIPASE, BLOOD: Lipase: 39 U/L (ref 11–51)

## 2024-01-26 LAB — PREGNANCY, URINE: Preg Test, Ur: NEGATIVE

## 2024-01-26 MED ORDER — KETOROLAC TROMETHAMINE 15 MG/ML IJ SOLN
15.0000 mg | Freq: Once | INTRAMUSCULAR | Status: AC
  Administered 2024-01-26: 15 mg via INTRAVENOUS
  Filled 2024-01-26: qty 1

## 2024-01-26 MED ORDER — HYDROMORPHONE HCL 1 MG/ML IJ SOLN
1.0000 mg | Freq: Once | INTRAMUSCULAR | Status: AC
  Administered 2024-01-26: 1 mg via INTRAVENOUS
  Filled 2024-01-26: qty 1

## 2024-01-26 MED ORDER — IOHEXOL 300 MG/ML  SOLN
100.0000 mL | Freq: Once | INTRAMUSCULAR | Status: AC | PRN
  Administered 2024-01-26: 100 mL via INTRAVENOUS

## 2024-01-26 MED ORDER — SODIUM CHLORIDE 0.9 % IV BOLUS
500.0000 mL | Freq: Once | INTRAVENOUS | Status: AC
  Administered 2024-01-26: 500 mL via INTRAVENOUS

## 2024-01-26 MED ORDER — ONDANSETRON HCL 4 MG/2ML IJ SOLN
4.0000 mg | Freq: Once | INTRAMUSCULAR | Status: AC
  Administered 2024-01-26: 4 mg via INTRAVENOUS
  Filled 2024-01-26: qty 2

## 2024-01-26 NOTE — Telephone Encounter (Signed)
PA has been approved and documented in separate encounter, please sign off on rx in this encounter as PA team is unable to resolve RX requests. Thank you

## 2024-01-26 NOTE — ED Provider Notes (Signed)
 [place] [facility] AT [facility] Provider Note   CSN: [id number] Arrival date & time: 01/26/24  4098     History  Chief Complaint  Patient presents with   Flank Pain    Michelle Gibson is a 48 y.o. female.  The history is provided by the patient and medical records.  Flank Pain  Michelle Gibson is a 48 y.o. female who presents to the Emergency Department complaining of abdominal pain.  She presents to the emergency department for evaluation of 1 week of left upper quadrant abdominal pain that radiates to her back.  Pain is described as constant and throbbing.  Sometimes she feels better with standing.  She was sick last week and 5 days ago had vomiting and fever to 101 for 2 days.  She has been free of the symptoms for 2 days.  She also had shingles around that time to her right upper back.  She did start on Valtrex for that today.  She has a history of diabetes and takes Mounjaro.  She has been on it for the last 6 weeks.  She does not have any cough, pain on breathing, leg swelling.  She does report decreased urine output.  No dysuria.   Hx/o DM, WPW, MVR     Home Medications Prior to Admission medications   Medication Sig Start Date End Date Taking? Authorizing Provider  Accu-Chek Softclix Lancets lancets Use 4 (four) times daily -  before meals and at bedtime. 04/05/23   Senaida Dama, NP  atorvastatin (LIPITOR) 20 MG tablet Take 1 tablet (20 mg total) by mouth daily. 05/25/21   Fleming, Zelda W, NP  Blood Glucose Monitoring Suppl (ACCU-CHEK GUIDE ME) w/Device KIT Use as directed 4 (four) times daily -  before meals and at bedtime. 04/05/23   Senaida Dama, NP  Blood Glucose Monitoring Suppl (ONETOUCH VERIO) w/Device KIT Use to check blood sugar TID. 12/10/20   Newlin, Enobong, MD  Continuous Blood Gluc Transmit (DEXCOM G6 TRANSMITTER) MISC 1 each by Does not apply route 4 (four) times daily -  before meals and at bedtime. 06/19/21   Senaida Dama, NP   Continuous Glucose Sensor (DEXCOM G7 SENSOR) MISC 1 Device by Does not apply route continuous. 01/10/24   Motwani, Komal, MD  cyclobenzaprine (FLEXERIL) 5 MG tablet TAKE 1 TABLET (5 MG TOTAL) BY MOUTH 2 (TWO) TIMES DAILY AS NEEDED FOR MUSCLE SPASMS. 06/16/23   Senaida Dama, NP  fluconazole (DIFLUCAN) 150 MG tablet Take 1 tablet by mouth once. Repeat in 3 days if needed. 06/29/22   Farris Hong, PA-C  FLUoxetine (PROZAC) 10 MG capsule Take 2 capsules (20 mg total) by mouth at bedtime. 02/11/23   Senaida Dama, NP  fluticasone (FLONASE) 50 MCG/ACT nasal spray Place 1 spray into both nostrils daily. 08/15/22   Dodson Freestone, FNP  gabapentin (NEURONTIN) 300 MG capsule Take 2 capsules (600 mg total) by mouth 4 (four) times daily. 07/08/23   Senaida Dama, NP  Glucagon (BAQSIMI ONE PACK) 3 MG/DOSE POWD Place 1 Device into the nose as needed (Low blood sugar with impaired consciousness). 12/29/23   Motwani, Komal, MD  glucose blood (TRUE METRIX BLOOD GLUCOSE TEST) test strip Use 4 (four) times daily -  before meals and at bedtime. Use as instructed 04/05/23   Senaida Dama, NP  hydrOXYzine (VISTARIL) 25 MG capsule Take 1 capsule (25 mg total) by mouth every 8 (eight) hours as needed.  02/11/23   Senaida Dama, NP  insulin glargine (LANTUS) 100 UNIT/ML Solostar Pen Inject 34 units into the skin once daily. After 1 week, if fasting blood sugar > 200, increase to 40 units in the morning. 02/15/23   Newlin, Enobong, MD  insulin lispro (HUMALOG) 100 UNIT/ML KwikPen Inject 10 Units into the skin 3 (three) times daily. If blood sugar before you eat is under 150, do not take. 02/15/23   Newlin, Enobong, MD  Insulin Pen Needle 32G X 4 MM MISC Use a total of 4 pen needles a day. 02/15/23   Newlin, Enobong, MD  ipratropium-albuterol (DUONEB) 0.5-2.5 (3) MG/3ML SOLN Take 3 mLs by nebulization every 6 (six) hours as needed (wheezing).    [provider]  metFORMIN (GLUCOPHAGE) 1000 MG tablet Take 1 tablet (1,000 mg  total) by mouth 2 (two) times daily with a meal. 04/21/23 12/29/23  Senaida Dama, NP  nitrofurantoin, macrocrystal-monohydrate, (MACROBID) 100 MG capsule Take 1 capsule (100 mg total) by mouth 2 (two) times daily. 06/29/22   Farris Hong, PA-C  nystatin cream (MYCOSTATIN) Apply topically 2 (two) times daily as needed (apply to rash). 06/09/21   Vann, Jessica U, DO  ondansetron (ZOFRAN-ODT) 4 MG disintegrating tablet Place 1 tab under the tongue every 4 hours as needed for nausea and vomitting 10/22/21   Cheyenne Cotta, MD  OneTouch Delica Lancets 33G MISC Use to check blood sugar TID. 12/10/20   Newlin, Enobong, MD  oseltamivir (TAMIFLU) 75 MG capsule Take 1 capsule (75 mg total) by mouth every 12 (twelve) hours. 09/05/21   Vernestine Gondola, PA-C  SUMAtriptan (IMITREX) 25 MG tablet Take 25 mg (1 tablet) total by mouth at the start of the headache. May repeat in 2 hours x 1 if headache persists. Max of 2 tabs/24 hours 08/18/21   Senaida Dama, NP  tamsulosin (FLOMAX) 0.4 MG CAPS capsule Take 1 capsule (0.4 mg total) by mouth daily. 08/21/22   Zammit, Joseph, MD  tirzepatide (MOUNJARO) 5 MG/0.5ML Pen Inject 5 mg into the skin once a week. 12/29/23   Motwani, Komal, MD  topiramate (TOPAMAX) 50 MG tablet Take 1 tablet (50 mg total) by mouth daily as needed (migraine). Patient not taking: Reported on 01/19/2023 06/09/21 06/09/22  Vann, Jessica U, DO  traMADol (ULTRAM) 50 MG tablet Take 1 tablet (50 mg total) by mouth 3 (three) times daily as needed. 01/13/23   Sandie Cross, PA-C  valACYclovir (VALTREX) 1000 MG tablet Take 1 tablet (1,000 mg total) by mouth 3 (three) times daily for 7 days. 01/25/24 02/01/24  Farris Hong, PA-C  metoCLOPramide (REGLAN) 10 MG tablet Take 1 tablet (10 mg total) by mouth every 8 (eight) hours as needed for nausea or vomiting (headache). Patient not taking: Reported on 07/14/2020 07/07/20 07/17/20  Petrucelli, Samantha R, PA-C  traZODone (DESYREL) 50 MG tablet Take 0.5-1  tablets (25-50 mg total) by mouth at bedtime as needed for sleep. 06/04/19 07/13/19  Fulp, Margy Shin, MD      Allergies    Ciprofloxacin, Lyrica cr [pregabalin er], Doxycycline, Flecainide, Latex, and Penicillin g    Review of Systems   Review of Systems  Genitourinary:  Positive for flank pain.  All other systems reviewed and are negative.   Physical Exam Updated Vital Signs BP 129/84   Pulse (!) 108   Temp 98.9 F (37.2 C)   Resp 20   Ht 5\' 4"  (1.626 m)   Wt 78.9 kg   SpO2 99%  BMI 29.87 kg/m  Physical Exam Vitals and nursing note reviewed.  Constitutional:      Appearance: She is well-developed.  HENT:     Head: Normocephalic and atraumatic.  Cardiovascular:     Rate and Rhythm: Normal rate and regular rhythm.     Heart sounds: No murmur heard. Pulmonary:     Effort: Pulmonary effort is normal. No respiratory distress.     Breath sounds: Normal breath sounds.  Abdominal:     Palpations: Abdomen is soft.     Tenderness: There is no guarding or rebound.     Comments: Moderate LUQ tenderness  Musculoskeletal:        General: No tenderness.  Skin:    General: Skin is warm and dry.     Comments: Vesicular rash to right upper back  Neurological:     Mental Status: She is alert and oriented to person, place, and time.  Psychiatric:        Behavior: Behavior normal.     ED Results / Procedures / Treatments   Labs (all labs ordered are listed, but only abnormal results are displayed) Labs Reviewed  URINALYSIS, ROUTINE W REFLEX MICROSCOPIC - Abnormal; Notable for the following components:      Result Value   Bilirubin Urine MODERATE (*)    Ketones, ur 15 (*)    Protein, ur 30 (*)    All other components within normal limits  COMPREHENSIVE METABOLIC PANEL WITH GFR - Abnormal; Notable for the following components:   Glucose, Bld 115 (*)    Total Protein 8.2 (*)    All other components within normal limits  URINALYSIS, MICROSCOPIC (REFLEX) - Abnormal; Notable for  the following components:   Bacteria, UA FEW (*)    All other components within normal limits  CBC WITH DIFFERENTIAL/PLATELET  PREGNANCY, URINE  LIPASE, BLOOD  TROPONIN I (HIGH SENSITIVITY)    EKG EKG Interpretation Date/Time:  Thursday January 26 2024 04:22:20 EDT Ventricular Rate:  84 PR Interval:  138 QRS Duration:  103 QT Interval:  373 QTC Calculation: 441 R Axis:   44  Text Interpretation: Sinus rhythm RSR' in V1 or V2, right VCD or RVH Nonspecific T abnrm, anterolateral leads No significant change since last tracing Confirmed by Kelsey Patricia [phone]) on 01/26/2024 4:38:39 AM  Radiology CT ABDOMEN PELVIS W CONTRAST Result Date: 01/26/2024 CLINICAL DATA:  Left upper quadrant pain and fever. Decreased urinary frequency. EXAM: CT ABDOMEN AND PELVIS WITH CONTRAST TECHNIQUE: Multidetector CT imaging of the abdomen and pelvis was performed using the standard protocol following bolus administration of intravenous contrast. RADIATION DOSE REDUCTION: This exam was performed according to the departmental dose-optimization program which includes automated exposure control, adjustment of the mA and/or kV according to patient size and/or use of iterative reconstruction technique. CONTRAST:  100mL OMNIPAQUE IOHEXOL 300 MG/ML  SOLN COMPARISON:  CTs without contrast 10/22/2021 and 09/16/2020. FINDINGS: Lower chest: No abnormality. Hepatobiliary: No focal liver abnormality is seen. The liver is 18.5 cm length and mildly steatotic. Status post cholecystectomy. There is no increased biliary dilatation. Common bile duct chronically measures 10 mm. Pancreas: Truncated pancreatic tail.  No focal abnormality. Spleen: No abnormality. Adrenals/Urinary Tract: Adrenal glands are unremarkable. Kidneys are normal, without renal calculi, focal lesion, or hydronephrosis. Bladder is unremarkable. Stomach/Bowel: No dilatation or wall thickening. Surgical absence of appendix again noted, scattered colonic diverticula  without diverticulitis. Vascular/Lymphatic: No significant vascular findings are present. No enlarged abdominal or pelvic lymph nodes. Reproductive: Status post hysterectomy. No adnexal  masses. Other: Evidence of recent injection into the subcutaneous fat of the right anterior abdominal wall. There is no incarcerated hernia. There is no free fluid, free hemorrhage or free air. Musculoskeletal: Right flank battery pack again noted with stable positioning of a left sacral stimulator wire. The there are degenerative changes of spine with multilevel endplate Schmorl's nodes and degenerative discs. No acute or other significant osseous findings. IMPRESSION: 1. No acute CT findings in the abdomen or pelvis. 2. Diverticulosis without evidence of diverticulitis. 3. Mildly enlarged and steatotic liver. 4. Chronically dilated common bile duct at 10 mm status post cholecystectomy. 5. Degenerative changes of the spine. Electronically Signed   By: Denman Fischer M.D.   On: 01/26/2024 05:50    Procedures Procedures    Medications Ordered in ED Medications  ketorolac (TORADOL) 15 MG/ML injection 15 mg (has no administration in time range)  sodium chloride 0.9 % bolus 500 mL (0 mLs Intravenous Stopped 01/26/24 0458)  ondansetron (ZOFRAN) injection 4 mg (4 mg Intravenous Given 01/26/24 0358)  HYDROmorphone (DILAUDID) injection 1 mg (1 mg Intravenous Given 01/26/24 0359)  iohexol (OMNIPAQUE) 300 MG/ML solution 100 mL (100 mLs Intravenous Contrast Given 01/26/24 0523)    ED Course/ Medical Decision Making/ A&P                                 Medical Decision Making Amount and/or Complexity of Data Reviewed Labs: ordered. Radiology: ordered.  Risk Prescription drug management.   Patient with history of diabetes, fibromyalgia and chronic pain here for evaluation of left upper quadrant pain for 1 week.  She has had fever and vomiting although the symptoms did resolve 2 days ago.  She has focal tenderness on  examination.  Labs reassuring.  CT abdomen pelvis was obtained given her history of diverticulosis and fever.  CT is negative for diverticulitis or acute intra-abdominal pathology.  She does incidentally have shingles to her right upper back, do not feel this is likely contributing to her left upper quadrant pain at this time.  She did start on Valtrex today.  Discussed with patient unclear source of her pain.  She did have improvement with hydromorphone administration.  Will provide a dose of ketorolac as well for her pain.  Discussed continuing her current medications at home, may supplement with heating pads, over-the-counter Lidoderm or ibuprofen.  Discussed PCP follow-up as well as return precautions.        Final Clinical Impression(s) / ED Diagnoses Final diagnoses:  LUQ pain    Rx / DC Orders ED Discharge Orders     None         Kelsey Patricia, MD 01/26/24 [phone]

## 2024-01-26 NOTE — ED Triage Notes (Addendum)
 Patient reports left flank pain for 1 week that worsened tonight. Reports decrease in urinary frequency.  Hydrocodone 10mg  taken at 10pm without relief.

## 2024-01-26 NOTE — Progress Notes (Signed)
 Diabetes Self-Management Education  Visit Type: First/Initial  Appt. Start Time: 0800 Appt. End Time: 0911  01/27/2024  Ms. Michelle Gibson, identified by name and date of birth, is a 48 y.o. female with a diagnosis of Diabetes: Type 2.   ASSESSMENT  Patient is here today with her daughter, Burleigh Carp. Patient would like to learn about taking insulin  and what to eat for blood sugar control. Patient does the shopping and cooking. Pt reports receiving SNAP benefits. Pt reports Hx of gestational diabetes requiring insulin .  Pt reports she takes "30" lantus  once daily ~ 7 am. Pt reports administration of Humalog  typically with dinner stating "10" units. Pt c/o dexcom alarming for hypoglycemia and treating with one glucose tablet. Pt reports at times she feels symptoms of shaking and disoriented. RD reviewed the rule of 15 and encouraged Pt to perform a finger stick and calibration and carry a fast acting sugar source. Pt reports since taking Mounjaro for the past 6 weeks her appetite is decreased stating  "I have to force myslef to eat". Pt reports last week an episode of vomiting that she feels is related to a virus. Pt encouraged to discuss further with prescribing provider if nausea and vomiting persist.  All Pt's questions were answered during this encounter.    CGM Results from download:  Dexcom  Average glucose:   129 mg/dL for 14 days  Glucose management indicator:   6.4 %  Time in range (70-180 mg/dL):   91 %   (Goal >57%)  Time High (181-250 mg/dL):   8 %   (Goal < 84%)  Time Very High (>250 mg/dL):    <1 %   (Goal < 5%)  Time Low (54-69 mg/dL):   1 %   (Goal <6%)  Time Very Low (<54 mg/dL):   1 %   (Goal <9%)     History includes:   Past Medical History:  Diagnosis Date   Acute tear lateral meniscus, right, initial encounter 08/29/2019   Asthma    Closed fracture of upper end of fibula 09/21/2018   Diabetes mellitus without complication (HCC)    TYPE II   Hyperglycemia 09/15/2016    Migraines    WPW (Wolff-Parkinson-White syndrome)    s/p ablation at age 63    Medications include:   Current Outpatient Medications:    Continuous Blood Gluc Transmit (DEXCOM G6 TRANSMITTER) MISC, 1 each by Does not apply route 4 (four) times daily -  before meals and at bedtime., Disp: 1 each, Rfl: 1   Continuous Glucose Sensor (DEXCOM G7 SENSOR) MISC, 1 Device by Does not apply route continuous., Disp: 9 each, Rfl: 1   insulin  glargine (LANTUS ) 100 UNIT/ML Solostar Pen, Inject 34 units into the skin once daily. After 1 week, if fasting blood sugar > 200, increase to 40 units in the morning., Disp: 15 mL, Rfl: 1   insulin  lispro (HUMALOG ) 100 UNIT/ML KwikPen, Inject 10 Units into the skin 3 (three) times daily. If blood sugar before you eat is under 150, do not take., Disp: 15 mL, Rfl: 3   tirzepatide (MOUNJARO) 5 MG/0.5ML Pen, Inject 5 mg into the skin once a week., Disp: 2 mL, Rfl: 0   Accu-Chek Softclix Lancets lancets, Use 4 (four) times daily -  before meals and at bedtime., Disp: 100 each, Rfl: 4   atorvastatin  (LIPITOR) 20 MG tablet, Take 1 tablet (20 mg total) by mouth daily., Disp: 90 tablet, Rfl: 3   Blood Glucose Monitoring Suppl (ACCU-CHEK  GUIDE ME) w/Device KIT, Use as directed 4 (four) times daily -  before meals and at bedtime., Disp: 1 kit, Rfl: 0   Blood Glucose Monitoring Suppl (ONETOUCH VERIO) w/Device KIT, Use to check blood sugar TID., Disp: 1 kit, Rfl: 0   cyclobenzaprine  (FLEXERIL ) 5 MG tablet, TAKE 1 TABLET (5 MG TOTAL) BY MOUTH 2 (TWO) TIMES DAILY AS NEEDED FOR MUSCLE SPASMS., Disp: 30 tablet, Rfl: 1   fluconazole  (DIFLUCAN ) 150 MG tablet, Take 1 tablet by mouth once. Repeat in 3 days if needed., Disp: 2 tablet, Rfl: 0   FLUoxetine  (PROZAC ) 10 MG capsule, Take 2 capsules (20 mg total) by mouth at bedtime., Disp: 30 capsule, Rfl: 1   fluticasone  (FLONASE ) 50 MCG/ACT nasal spray, Place 1 spray into both nostrils daily., Disp: 16 g, Rfl: 0   gabapentin  (NEURONTIN ) 300 MG  capsule, Take 2 capsules (600 mg total) by mouth 4 (four) times daily., Disp: 240 capsule, Rfl: 2   Glucagon  (BAQSIMI  ONE PACK) 3 MG/DOSE POWD, Place 1 Device into the nose as needed (Low blood sugar with impaired consciousness)., Disp: 2 each, Rfl: 3   glucose blood (TRUE METRIX BLOOD GLUCOSE TEST) test strip, Use 4 (four) times daily -  before meals and at bedtime. Use as instructed, Disp: 100 each, Rfl: 12   hydrOXYzine  (VISTARIL ) 25 MG capsule, Take 1 capsule (25 mg total) by mouth every 8 (eight) hours as needed., Disp: 30 capsule, Rfl: 1   Insulin  Pen Needle 32G X 4 MM MISC, Use a total of 4 pen needles a day., Disp: 100 each, Rfl: 3   ipratropium-albuterol  (DUONEB) 0.5-2.5 (3) MG/3ML SOLN, Take 3 mLs by nebulization every 6 (six) hours as needed (wheezing)., Disp: , Rfl:    metFORMIN  (GLUCOPHAGE ) 1000 MG tablet, Take 1 tablet (1,000 mg total) by mouth 2 (two) times daily with a meal., Disp: 180 tablet, Rfl: 0   nitrofurantoin , macrocrystal-monohydrate, (MACROBID ) 100 MG capsule, Take 1 capsule (100 mg total) by mouth 2 (two) times daily., Disp: 10 capsule, Rfl: 0   nystatin  cream (MYCOSTATIN ), Apply topically 2 (two) times daily as needed (apply to rash)., Disp: 30 g, Rfl: 0   ondansetron  (ZOFRAN -ODT) 4 MG disintegrating tablet, Place 1 tab under the tongue every 4 hours as needed for nausea and vomitting, Disp: 12 tablet, Rfl: 0   OneTouch Delica Lancets 33G MISC, Use to check blood sugar TID., Disp: 100 each, Rfl: 2   oseltamivir  (TAMIFLU ) 75 MG capsule, Take 1 capsule (75 mg total) by mouth every 12 (twelve) hours., Disp: 10 capsule, Rfl: 0   SUMAtriptan  (IMITREX ) 25 MG tablet, Take 25 mg (1 tablet) total by mouth at the start of the headache. May repeat in 2 hours x 1 if headache persists. Max of 2 tabs/24 hours, Disp: 30 tablet, Rfl: 0   tamsulosin  (FLOMAX ) 0.4 MG CAPS capsule, Take 1 capsule (0.4 mg total) by mouth daily., Disp: 10 capsule, Rfl: 0   topiramate  (TOPAMAX ) 50 MG tablet, Take  1 tablet (50 mg total) by mouth daily as needed (migraine). (Patient not taking: Reported on 01/19/2023), Disp: , Rfl:    traMADol  (ULTRAM ) 50 MG tablet, Take 1 tablet (50 mg total) by mouth 3 (three) times daily as needed., Disp: 30 tablet, Rfl: 2   valACYclovir  (VALTREX ) 1000 MG tablet, Take 1 tablet (1,000 mg total) by mouth 3 (three) times daily for 7 days., Disp: 21 tablet, Rfl: 0  Labs noted:   Lab Results  Component Value Date   HGBA1C  8.0 (A) 12/29/2022   Wt Readings from Last 3 Encounters:  01/26/24 174 lb (78.9 kg)  12/29/23 186 lb (84.4 kg)  02/11/23 186 lb 12.8 oz (84.7 kg)    There were no vitals taken for this visit. There is no height or weight on file to calculate BMI.   Diabetes Self-Management Education - 01/27/24 0818       Visit Information   Visit Type First/Initial      Initial Visit   Diabetes Type Type 2    Date Diagnosed 1999    Are you currently following a meal plan? No    Are you taking your medications as prescribed? Yes      Health Coping   How would you rate your overall health? Very Poor      Psychosocial Assessment   Patient Belief/Attitude about Diabetes Afraid    What is the hardest part about your diabetes right now, causing you the most concern, or is the most worrisome to you about your diabetes?   Taking/obtaining medications;Making healty food and beverage choices    Self-care barriers None    Self-management support Doctor's office    Other persons present Patient    Patient Concerns Nutrition/Meal planning;Medication    Special Needs None    Preferred Learning Style No preference indicated    Learning Readiness Ready    How often do you need to have someone help you when you read instructions, pamphlets, or other written materials from your doctor or pharmacy? 4 - Often    What is the last grade level you completed in school? 7th      Pre-Education Assessment   Patient understands the diabetes disease and treatment process.  Comprehends key points    Patient understands incorporating nutritional management into lifestyle. Needs Review    Patient undertands incorporating physical activity into lifestyle. Needs Review    Patient understands using medications safely. Needs Review    Patient understands monitoring blood glucose, interpreting and using results Needs Review    Patient understands prevention, detection, and treatment of acute complications. Needs Review    Patient understands prevention, detection, and treatment of chronic complications. Needs Review    Patient understands how to develop strategies to address psychosocial issues. Needs Review    Patient understands how to develop strategies to promote health/change behavior. Needs Review      Complications   Last HgB A1C per patient/outside source 10.5 %    How often do you check your blood sugar? > 4 times/day   dexcom   Fasting Blood glucose range (mg/dL) 16-109    Postprandial Blood glucose range (mg/dL) >604;540-981;191-478    Number of hypoglycemic episodes per month 1    Number of hyperglycemic episodes ( >200mg /dL): Occasional    Can you tell when your blood sugar is high? Yes    What do you do if your blood sugar is high? hungry    Have you had a dilated eye exam in the past 12 months? Yes    Have you had a dental exam in the past 12 months? No    Are you checking your feet? Yes    How many days per week are you checking your feet? 7      Dietary Intake   Breakfast equate meal replacement or white toast, butter and jelly, diet pepsi, water    Snack (morning) none or avocado    Lunch 1 banana, 1 avocado or salad with cheese and low fat dressing,  water    Snack (afternoon) 3 saltine or rtiz crackers, cream cheese    Dinner chex mix or grilled chicken, salad with cheese and low fat dressing, corn or broccoli, aspargus, diet pepsi    Snack (evening) popcorn    Beverage(s) water, diet pepsi      Activity / Exercise   Activity / Exercise  Type Light (walking / raking leaves)    How many days per week do you exercise? 7    How many minutes per day do you exercise? 10    Total minutes per week of exercise 70      Patient Education   Previous Diabetes Education No    Disease Pathophysiology Definition of diabetes, type 1 and 2, and the diagnosis of diabetes    Healthy Eating Food label reading, portion sizes and measuring food.;Plate Method;Carbohydrate counting;Role of diet in the treatment of diabetes and the relationship between the three main macronutrients and blood glucose level;Reviewed blood glucose goals for pre and post meals and how to evaluate the patients' food intake on their blood glucose level.;Meal timing in regards to the patients' current diabetes medication.    Being Active Role of exercise on diabetes management, blood pressure control and cardiac health.    Medications Reviewed patients medication for diabetes, action, purpose, timing of dose and side effects.    Monitoring Taught/evaluated CGM (comment);Identified appropriate SMBG and/or A1C goals.;Daily foot exams    Acute complications Taught prevention, symptoms, and  treatment of hypoglycemia - the 15 rule.    Chronic complications Relationship between chronic complications and blood glucose control    Diabetes Stress and Support Identified and addressed patients feelings and concerns about diabetes;Worked with patient to identify barriers to care and solutions    Preconception care --   n/a   Lifestyle and Health Coping Lifestyle issues that need to be addressed for better diabetes care      Individualized Goals (developed by patient)   Nutrition Follow meal plan discussed    Physical Activity Exercise 5-7 days per week;15 minutes per day;30 minutes per day    Medications take my medication as prescribed    Monitoring  Consistenly use CGM    Problem Solving Eating Pattern;Medication consistency;Addressing barriers to behavior change    Reducing Risk do  foot checks daily;examine blood glucose patterns;treat hypoglycemia with 15 grams of carbs if blood glucose less than 70mg /dL    Health Coping Ask for help with psychological, social, or emotional issues      Post-Education Assessment   Patient understands the diabetes disease and treatment process. Comprehends key points    Patient understands incorporating nutritional management into lifestyle. Comprehends key points    Patient undertands incorporating physical activity into lifestyle. Comprehends key points    Patient understands using medications safely. Comphrehends key points    Patient understands monitoring blood glucose, interpreting and using results Comprehends key points    Patient understands prevention, detection, and treatment of acute complications. Comprehends key points    Patient understands prevention, detection, and treatment of chronic complications. Comprehends key points    Patient understands how to develop strategies to address psychosocial issues. Needs Review    Patient understands how to develop strategies to promote health/change behavior. Needs Review      Outcomes   Expected Outcomes Demonstrated limited interest in learning.  Expect minimal changes    Future DMSE 3-4 months    Program Status Not Completed  Individualized Plan for Diabetes Self-Management Training:   Learning Objective:  Patient will have a greater understanding of diabetes self-management. Patient education plan is to attend individual and/or group sessions per assessed needs and concerns.   Plan:   Patient Instructions  Aim for balanced meals using the plate planner Treat low blood sugar with 15 grams of fast acting sugar source  Expected Outcomes:  Demonstrated limited interest in learning.  Expect minimal changes  Education material provided: ADA - How to Thrive: A Guide for Your Journey with Diabetes, My Plate, Snack sheet, Support group flyer, and Diabetes  Resources  If problems or questions, patient to contact team via:  Phone  Future DSME appointment: 3-4 months

## 2024-01-27 ENCOUNTER — Encounter: Attending: Family | Admitting: Dietician

## 2024-01-27 DIAGNOSIS — E1165 Type 2 diabetes mellitus with hyperglycemia: Secondary | ICD-10-CM | POA: Insufficient documentation

## 2024-01-27 NOTE — Patient Instructions (Signed)
 Aim for balanced meals using the plate planner Treat low blood sugar with 15 grams of fast acting sugar source

## 2024-01-30 NOTE — Telephone Encounter (Signed)
 Requested Prescriptions   Pending Prescriptions Disp Refills   Continuous Glucose Sensor (DEXCOM G7 SENSOR) MISC [Pharmacy Med Name: DEXCOM G7 SENSOR] 9 each 1    Sig: USE AS DIRECTED

## 2024-01-31 DIAGNOSIS — H547 Unspecified visual loss: Secondary | ICD-10-CM | POA: Diagnosis not present

## 2024-01-31 DIAGNOSIS — H47093 Other disorders of optic nerve, not elsewhere classified, bilateral: Secondary | ICD-10-CM | POA: Diagnosis not present

## 2024-02-10 ENCOUNTER — Ambulatory Visit: Admitting: Orthopaedic Surgery

## 2024-02-20 DIAGNOSIS — E6609 Other obesity due to excess calories: Secondary | ICD-10-CM | POA: Diagnosis not present

## 2024-02-20 DIAGNOSIS — M797 Fibromyalgia: Secondary | ICD-10-CM | POA: Diagnosis not present

## 2024-02-20 DIAGNOSIS — Z419 Encounter for procedure for purposes other than remedying health state, unspecified: Secondary | ICD-10-CM | POA: Diagnosis not present

## 2024-02-20 DIAGNOSIS — E1139 Type 2 diabetes mellitus with other diabetic ophthalmic complication: Secondary | ICD-10-CM | POA: Diagnosis not present

## 2024-02-20 DIAGNOSIS — E559 Vitamin D deficiency, unspecified: Secondary | ICD-10-CM | POA: Diagnosis not present

## 2024-02-20 DIAGNOSIS — G629 Polyneuropathy, unspecified: Secondary | ICD-10-CM | POA: Diagnosis not present

## 2024-02-20 DIAGNOSIS — E781 Pure hyperglyceridemia: Secondary | ICD-10-CM | POA: Diagnosis not present

## 2024-02-20 DIAGNOSIS — Z79899 Other long term (current) drug therapy: Secondary | ICD-10-CM | POA: Diagnosis not present

## 2024-02-20 DIAGNOSIS — N3010 Interstitial cystitis (chronic) without hematuria: Secondary | ICD-10-CM | POA: Diagnosis not present

## 2024-02-20 DIAGNOSIS — R29818 Other symptoms and signs involving the nervous system: Secondary | ICD-10-CM | POA: Diagnosis not present

## 2024-02-22 ENCOUNTER — Ambulatory Visit: Admitting: Orthopedic Surgery

## 2024-02-22 ENCOUNTER — Other Ambulatory Visit (INDEPENDENT_AMBULATORY_CARE_PROVIDER_SITE_OTHER): Payer: Self-pay

## 2024-02-22 VITALS — BP 103/71 | HR 75 | Ht 64.0 in | Wt 174.0 lb

## 2024-02-22 DIAGNOSIS — M542 Cervicalgia: Secondary | ICD-10-CM

## 2024-02-22 DIAGNOSIS — M5412 Radiculopathy, cervical region: Secondary | ICD-10-CM | POA: Diagnosis not present

## 2024-02-22 NOTE — Progress Notes (Signed)
 Orthopedic Spine Surgery Office Note  Assessment: Patient is a 48 y.o. female with neck pain that radiates into the bilateral upper extremities, possible radiculopathy   Plan: -Patient has tried: PT, Tylenol , ibuprofen , gabapentin , Flexeril  -Patient has been in pain management for the last year with Dr. Alline Areas.  She has been doing nonoperative treatments with Dr. Gara July for the last 6 months without any lasting relief, so recommended a CT myelogram of the cervical spine since she has a stimulator that is not MRI compatible -Spent some time talking about symptoms that seem consistent with neck as etiology and others that do not seem to be coming from the neck.  Specifically, her pain radiating into the back of her skull and into her face would not be consistent with cervical spine pathology.  I said sometimes headaches are related to the cervical spine but often are from a different etiology -She should continue with pain management for pain control -Would need an A1c of 7.5 or less prior to any elective spine surgery -Patient should return to office in 4 weeks, x-rays at next visit: None   Patient expressed understanding of the plan and all questions were answered to the patient's satisfaction.   ___________________________________________________________________________   History:  Patient is a 48 y.o. female who presents today for cervical spine.  Patient has had about 2 years of neck pain that radiates into her bilateral upper extremities.  She feels the going into the lateral aspect of the shoulders and arms into the dorsal forearms.  She also feels the going into the radial aspect of the hand and fingers. Pain has been getting progressively worse with time. There is no trauma or injury that preceded the onset of pain.  She also notices a numbness in the same distribution as the pain in her arms.  She reports pain radiating into the back of her skull.  She does have a history of  occipital neuralgia.  She also has pain radiating into her cheeks.  She has issues with headaches as well.  She said they started around the time that her neck pain started.   Weakness: Denies Difficulty with fine motor skills (e.g., buttoning shirts, handwriting): Yes, periodically drops objects.  No recent changes. This has been going on for years. Symptoms of imbalance: Denies Paresthesias and numbness: Yes, has decreased sensation along the lateral aspect of her arm and dorsal forearm.  No other numbness or paresthesias Bowel or bladder incontinence: Denies Saddle anesthesia: Denies  Treatments tried: PT, Tylenol , ibuprofen , gabapentin , Flexeril   Review of systems: Denies fevers and chills, night sweats, unexplained weight loss, history of cancer.  Has had pain that wakes her at night  Past medical history: Anxiety Psoriasis IBS Fibromyalgia Chronic pain DM (last A1c was 8.0 on 12/29/2022) Occipital neuralgia  Allergies: ciprofloxacin, lyrica , doxycycline , latex, flecainide, penicillin  Past surgical history:  Hysterectomy C section Bilateral knee partial meniscectomy  Social history: Denies use of nicotine product (smoking, vaping, patches, smokeless) Alcohol  use: denies Denies recreational drug use   Physical Exam:  BMI of 29.9  General: no acute distress, appears stated age Neurologic: alert, answering questions appropriately, following commands Respiratory: unlabored breathing on room air, symmetric chest rise Psychiatric: appropriate affect, normal cadence to speech   MSK (spine):  -Strength exam      Left  Right Grip strength                5/5  5/5 Interosseus   5/5   5/5 Wrist extension  5/5  5/5 Wrist flexion   5/5  5/5 Elbow flexion   5/5  5/5 Deltoid    5/5  5/5  -Sensory exam    Sensation intact to light touch in C5-T1 nerve distributions of bilateral upper extremities  -Brachioradialis DTR: 2/4 on the left, 2/4 on the right -Biceps DTR:  2/4 on the left, 2/4 on the right  -Spurling: negative bilaterally -Hoffman sign: negative bilaterally -Clonus: no beats bilaterally -Interosseous wasting: none seen -Grip and release test: negative  -Romberg: negative  -Gait: normal  Left shoulder exam: no pain through range of motion, negative jobe, negative belly press, no weakness with external rotation with arm at side Right shoulder exam: no pain through range of motion, negative jobe, negative belly press, no weakness with external rotation with arm at side  Tinel's at wrist: negative bilaterally Phalen's at wrist: negative bilaterally Durkan's: negative bilaterally  Tinel's at elbow: negative bilaterally  Imaging: XRs of the cervical spine from 02/22/2024 were independently reviewed and interpreted, showing C4/5 disc height loss with anterior osteophyte formation. No other significant degenerative changes. No fracture or dislocation seen. No evidence of instability on flexion/extension views.   CT of the cervical spine from 01/05/2023 was independently reviewed and interpreted, showing no fracture or dislocation. Dis height loss and anterior osteophyte formation at C4/5.   Patient name: Michelle Gibson Patient MRN: [id number] Date of visit: 02/22/24

## 2024-02-24 ENCOUNTER — Ambulatory Visit: Admission: EM | Admit: 2024-02-24 | Discharge: 2024-02-24 | Disposition: A | Discharge status: Home / Self Care

## 2024-02-24 ENCOUNTER — Encounter: Payer: Self-pay | Admitting: Emergency Medicine

## 2024-02-24 DIAGNOSIS — R590 Localized enlarged lymph nodes: Secondary | ICD-10-CM

## 2024-02-24 NOTE — ED Triage Notes (Signed)
 Pt reports swollen lymph nodes in anterior cervical area on R side that she noticed this morning. Notes R ear feels full and throat is starting to feel scratchy in last few hrs. No med use. No fevers

## 2024-02-24 NOTE — ED Provider Notes (Signed)
 [name]    CSN: [id number] Arrival date & time: 02/24/24  1319      History   Chief Complaint Chief Complaint  Patient presents with   Lymphadenopathy    HPI Michelle Gibson is a 48 y.o. female.   HPI Here today turned of right sided cervical adenopathy and right ear fullness.  Symptoms developed today only.  She is not having any coughing, congestion or URI symptoms.  She reports feeling as if she was coming down with something and felt that she may need an antibiotic.  No known sick contacts.  Patient has not taken any medications for current symptoms.  Past Medical History:  Diagnosis Date   Acute tear lateral meniscus, right, initial encounter 08/29/2019   Asthma    Closed fracture of upper end of fibula 09/21/2018   Diabetes mellitus without complication (HCC)    TYPE II   Hyperglycemia 09/15/2016   Migraines    WPW (Wolff-Parkinson-White syndrome)    s/p ablation at age 24    Patient Active Problem List   Diagnosis Date Noted   Fibromyalgia 04/04/2023   Migraine 08/18/2021   Diabetes mellitus with hyperglycemia (HCC) 06/08/2021   Uncontrolled type 2 diabetes mellitus with hyperglycemia (HCC) 06/08/2021   Nausea 06/08/2021   Hyperlipidemia 06/08/2021   History of COVID-19 03/19/2021   Tachycardia 03/19/2021   Palpitations 03/19/2021   Cough 03/19/2021   Complete tear of right ACL, initial encounter 12/10/2020   Acute tear lateral meniscus, right, initial encounter 08/29/2019   Acute medial meniscus tear of right knee 08/22/2019   Fever and chills 11/17/2018   Closed fracture of upper end of fibula 09/21/2018   WPW (Wolff-Parkinson-White syndrome) 10/10/2017   Asthma with acute exacerbation 09/15/2016   Hyperglycemia 09/15/2016   History of Wolff-Parkinson-White (WPW) syndrome 09/15/2016   Acute respiratory failure with hypoxia (HCC) 09/15/2016   Bronchospasm 09/11/2016    Past Surgical History:  Procedure Laterality Date   ABDOMINAL  HYSTERECTOMY     CARDIAC SURGERY     CESAREAN SECTION     KNEE ARTHROSCOPY WITH LATERAL MENISECTOMY Right 08/29/2019   Procedure: PARTIAL LATERAL MENISECTOMY;  Surgeon: Wes Hamman, MD;  Location: [facility];  Service: Orthopedics;  Laterality: Right;   KNEE ARTHROSCOPY WITH MEDIAL MENISECTOMY Right 08/29/2019   Procedure: RIGHT KNEE ARTHROSCOPY WITH PARTIAL MEDIAL MENISECTOMY;  Surgeon: Wes Hamman, MD;  Location: [facility];  Service: Orthopedics;  Laterality: Right;    OB History   No obstetric history on file.      Home Medications    Prior to Admission medications   Medication Sig Start Date End Date Taking? Authorizing Provider  Accu-Chek Softclix Lancets lancets Use 4 (four) times daily -  before meals and at bedtime. 04/05/23  Yes Rogerio Clay, Amy J, NP  atorvastatin  (LIPITOR) 40 MG tablet Take 40 mg by mouth at bedtime. 12/22/23  Yes [provider]  Blood Glucose Monitoring Suppl (ACCU-CHEK GUIDE ME) w/Device KIT Use as directed 4 (four) times daily -  before meals and at bedtime. 04/05/23  Yes Rogerio Clay, Amy J, NP  Blood Glucose Monitoring Suppl (ONETOUCH VERIO) w/Device KIT Use to check blood sugar TID. 12/10/20  Yes Newlin, Enobong, MD  buprenorphine (BUTRANS) 10 MCG/HR PTWK APPLY 1 PATCH TO THE SKIN ONCE A WEEK 11/29/23  Yes [provider]  buprenorphine (BUTRANS) 15 MCG/HR 1 patch once a week. 01/26/24  Yes [provider]  Continuous Blood Gluc Transmit (DEXCOM G6  TRANSMITTER) MISC 1 each by Does not apply route 4 (four) times daily -  before meals and at bedtime. 06/19/21  Yes Rogerio Clay, Amy J, NP  fluticasone  (FLONASE ) 50 MCG/ACT nasal spray Place 1 spray into both nostrils daily. 08/15/22  Yes Mound, Fairy Homer E, FNP  gabapentin  (NEURONTIN ) 600 MG tablet Take 600 mg by mouth 3 (three) times daily. 01/20/24  Yes [provider]  glucose blood (TRUE METRIX BLOOD GLUCOSE TEST) test strip Use 4 (four) times daily -  before  meals and at bedtime. Use as instructed 04/05/23  Yes Rogerio Clay, Amy J, NP  HYDROcodone -acetaminophen  (NORCO) 10-325 MG tablet Take 1 tablet by mouth 4 (four) times daily as needed. 02/20/24  Yes [provider]  ibuprofen  (ADVIL ) 800 MG tablet TAKE 1 TABLET (800 MG TOTAL) BY MOUTH 3 (THREE) TIMES DAILY. X 7 DAYS THEN AS NEEDED FOR PAIN EAT PRIOR TO TAKING THIS MEDICATION   Yes [provider]  insulin  glargine (LANTUS ) 100 UNIT/ML Solostar Pen Inject 34 units into the skin once daily. After 1 week, if fasting blood sugar > 200, increase to 40 units in the morning. 02/15/23  Yes Newlin, Enobong, MD  insulin  lispro (HUMALOG ) 100 UNIT/ML KwikPen Inject 10 Units into the skin 3 (three) times daily. If blood sugar before you eat is under 150, do not take. 02/15/23  Yes Newlin, Enobong, MD  Insulin  Pen Needle 32G X 4 MM MISC Use a total of 4 pen needles a day. 02/15/23  Yes Newlin, Enobong, MD  metFORMIN  (GLUCOPHAGE ) 1000 MG tablet Take 1 tablet (1,000 mg total) by mouth 2 (two) times daily with a meal. 04/21/23 02/24/24 Yes Rogerio Clay, Amy J, NP  omeprazole (PRILOSEC) 40 MG capsule Take 40 mg by mouth daily. 02/20/24  Yes [provider]  ondansetron  (ZOFRAN -ODT) 4 MG disintegrating tablet Place 1 tab under the tongue every 4 hours as needed for nausea and vomitting 10/22/21  Yes Zammit, Joseph, MD  OneTouch Delica Lancets 33G MISC Use to check blood sugar TID. 12/10/20  Yes Newlin, Enobong, MD  propafenone (RYTHMOL SR) 225 MG 12 hr capsule  12/30/23  Yes [provider]  propafenone (RYTHMOL) 150 MG tablet Take 150 mg by mouth 3 (three) times daily. 09/12/23  Yes [provider]  tirzepatide Florence Hunt) 5 MG/0.5ML Pen Inject 5 mg into the skin once a week. 12/29/23  Yes Motwani, Komal, MD  tiZANidine (ZANAFLEX) 2 MG tablet Take 2 mg by mouth 3 (three) times daily as needed. 11/29/23  Yes [provider]  tiZANidine (ZANAFLEX) 4 MG tablet Take 4 mg by mouth 3 (three) times  daily.   Yes [provider]  atorvastatin  (LIPITOR) 20 MG tablet Take 1 tablet (20 mg total) by mouth daily. 05/25/21   Fleming, Zelda W, NP  benzonatate  (TESSALON ) 100 MG capsule Take 1 capsule by mouth every 8 (eight) hours. Patient not taking: Reported on 02/24/2024 09/15/22   [provider]  busPIRone (BUSPAR) 10 MG tablet Take 10 mg by mouth. Patient not taking: Reported on 02/24/2024 11/03/23   [provider]  BUTRANS 5 MCG/HR PTWK 1 patch once a week. Patient not taking: Reported on 02/24/2024 08/31/23   [provider]  Continuous Glucose Sensor (DEXCOM G7 SENSOR) MISC USE AS DIRECTED 01/30/24   Jorge Newcomer, MD  Continuous Glucose Sensor (DEXCOM G7 SENSOR) MISC 1 Device by Does not apply route continuous. 01/10/24   Motwani, Komal, MD  cyclobenzaprine  (FLEXERIL ) 5 MG tablet TAKE 1 TABLET (5 MG TOTAL)  BY MOUTH 2 (TWO) TIMES DAILY AS NEEDED FOR MUSCLE SPASMS. Patient not taking: Reported on 02/24/2024 06/16/23   Senaida Dama, NP  fluconazole  (DIFLUCAN ) 150 MG tablet Take 1 tablet by mouth once. Repeat in 3 days if needed. Patient not taking: Reported on 02/24/2024 06/29/22   Farris Hong, PA-C  FLUoxetine  (PROZAC ) 10 MG capsule Take 2 capsules (20 mg total) by mouth at bedtime. Patient not taking: Reported on 02/24/2024 02/11/23   Senaida Dama, NP  gabapentin  (NEURONTIN ) 100 MG capsule     [provider]  gabapentin  (NEURONTIN ) 300 MG capsule Take 2 capsules (600 mg total) by mouth 4 (four) times daily. 07/08/23   Senaida Dama, NP  Glucagon  (BAQSIMI  ONE PACK) 3 MG/DOSE POWD Place 1 Device into the nose as needed (Low blood sugar with impaired consciousness). 12/29/23   Motwani, Komal, MD  hydrOXYzine  (VISTARIL ) 25 MG capsule Take 1 capsule (25 mg total) by mouth every 8 (eight) hours as needed. Patient not taking: Reported on 02/24/2024 02/11/23   Senaida Dama, NP  ipratropium-albuterol  (DUONEB) 0.5-2.5 (3) MG/3ML SOLN Take 3 mLs by  nebulization every 6 (six) hours as needed (wheezing). Patient not taking: Reported on 02/24/2024    [provider]  metFORMIN  (GLUCOPHAGE ) 500 MG tablet     [provider]  naloxone (NARCAN) nasal spray 4 mg/0.1 mL SMARTSIG:Both Nares    [provider]  nitrofurantoin , macrocrystal-monohydrate, (MACROBID ) 100 MG capsule Take 1 capsule (100 mg total) by mouth 2 (two) times daily. Patient not taking: Reported on 02/24/2024 06/29/22   Farris Hong, PA-C  nystatin  cream (MYCOSTATIN ) Apply topically 2 (two) times daily as needed (apply to rash). Patient not taking: Reported on 02/24/2024 06/09/21   Vann, Jessica U, DO  oseltamivir  (TAMIFLU ) 75 MG capsule Take 1 capsule (75 mg total) by mouth every 12 (twelve) hours. Patient not taking: Reported on 02/24/2024 09/05/21   Vernestine Gondola, PA-C  prazosin (MINIPRESS) 1 MG capsule SMARTSIG:1 Capsule(s) By Mouth Every Evening Patient not taking: Reported on 02/24/2024 09/29/23   [provider]  QUEtiapine (SEROQUEL) 25 MG tablet Take 25 mg by mouth 2 (two) times daily. Patient not taking: Reported on 02/24/2024 09/29/23   [provider]  SUMAtriptan  (IMITREX ) 25 MG tablet Take 25 mg (1 tablet) total by mouth at the start of the headache. May repeat in 2 hours x 1 if headache persists. Max of 2 tabs/24 hours Patient not taking: Reported on 02/24/2024 08/18/21   Senaida Dama, NP  tamsulosin  (FLOMAX ) 0.4 MG CAPS capsule Take 1 capsule (0.4 mg total) by mouth daily. 08/21/22   Zammit, Joseph, MD  topiramate  (TOPAMAX ) 50 MG tablet Take 1 tablet (50 mg total) by mouth daily as needed (migraine). Patient not taking: Reported on 01/19/2023 06/09/21 06/09/22  Vann, Jessica U, DO  traMADol  (ULTRAM ) 50 MG tablet Take 1 tablet (50 mg total) by mouth 3 (three) times daily as needed. Patient not taking: Reported on 02/24/2024 01/13/23   Sandie Cross, PA-C  VRAYLAR 1.5 MG capsule Take 1.5 mg by mouth. Patient not taking:  Reported on 02/24/2024 09/29/23   [provider]  metoCLOPramide  (REGLAN ) 10 MG tablet Take 1 tablet (10 mg total) by mouth every 8 (eight) hours as needed for nausea or vomiting (headache). Patient not taking: Reported on 07/14/2020 07/07/20 07/17/20  Petrucelli, Samantha R, PA-C  traZODone  (DESYREL ) 50 MG tablet Take 0.5-1 tablets (25-50 mg total) by mouth at bedtime as needed for  sleep. 06/04/19 07/13/19  Berneda Bridges, MD    Family History Family History  Problem Relation Age of Onset   Hypertension Mother    CAD Mother 46   Diabetes Mother    Hypertension Father    CAD Father 103       CABG    Social History Social History   Tobacco Use   Smoking status: Some Days    Current packs/day: 0.10    Types: Cigarettes    Passive exposure: Current   Smokeless tobacco: Former  Building services engineer status: Some Days  Substance Use Topics   Alcohol  use: No   Drug use: No     Allergies   Penicillamine, Ciprofloxacin, Lyrica  cr [pregabalin  er], Doxycycline , Flecainide, Latex, Penicillin g, and Penicillins   Review of Systems Review of Systems Pertinent negatives listed in HPI   Physical Exam Triage Vital Signs ED Triage Vitals  Encounter Vitals Group     BP 02/24/24 1507 122/85     Systolic BP Percentile --      Diastolic BP Percentile --      Pulse Rate 02/24/24 1507 74     Resp 02/24/24 1507 16     Temp 02/24/24 1507 98.1 F (36.7 C)     Temp Source 02/24/24 1507 Oral     SpO2 02/24/24 1507 98 %     Weight --      Height --      Head Circumference --      Peak Flow --      Pain Score 02/24/24 1508 4     Pain Loc --      Pain Education --      Exclude from Growth Chart --    No data found.  Updated Vital Signs BP 122/85 (BP Location: Left Arm)   Pulse 74   Temp 98.1 F (36.7 C) (Oral)   Resp 16   SpO2 98%   Visual Acuity Right Eye Distance:   Left Eye Distance:   Bilateral Distance:    Right Eye Near:   Left Eye Near:    Bilateral Near:      Physical Exam Vitals reviewed.  Constitutional:      Appearance: Normal appearance.  HENT:     Head: Normocephalic.     Right Ear: Tympanic membrane, ear canal and external ear normal.     Left Ear: Tympanic membrane, ear canal and external ear normal.     Mouth/Throat:     Mouth: Mucous membranes are moist.     Pharynx: Oropharynx is clear. No oropharyngeal exudate or posterior oropharyngeal erythema.  Eyes:     Extraocular Movements: Extraocular movements intact.     Pupils: Pupils are equal, round, and reactive to light.  Cardiovascular:     Rate and Rhythm: Normal rate and regular rhythm.  Pulmonary:     Effort: Pulmonary effort is normal.     Breath sounds: Normal breath sounds.  Lymphadenopathy:     Cervical: Cervical adenopathy (bilaterally) present.  Skin:    General: Skin is warm and dry.  Neurological:     General: No focal deficit present.     Mental Status: She is alert and oriented to person, place, and time.      UC Treatments / Results  Labs (all labs ordered are listed, but only abnormal results are displayed) Labs Reviewed - No data to display  EKG   Radiology No results found.  Procedures Procedures (including critical care time)  Medications Ordered in UC Medications - No data to display  Initial Impression / Assessment and Plan / UC Course  I have reviewed the triage vital signs and the nursing notes.  Pertinent labs & imaging results that were available during my care of the patient were reviewed by me and considered in my medical decision making (see chart for details).    Adenopathy of unknown etiology, upper respiratory exam completely benign.  As patient that antibiotics not indicated for acute onset of symptoms as these are likely viral in etiology or possibly caused by allergies.  Recommended over-the-counter antihistamine treatment and if pain is present NSAIDs such as naproxen  or ibuprofen .  Advised patient that she may be coming  down with an illness however based on exam findings today unable to reliably diagnose any illness.  Watch and wait . Return as needed. Final Clinical Impressions(s) / UC Diagnoses   Final diagnoses:  Cervical adenopathy   Discharge Instructions   None    ED Prescriptions   None    PDMP not reviewed this encounter.   Buena Carmine, NP 02/24/24 1538

## 2024-02-29 ENCOUNTER — Encounter: Payer: Self-pay | Admitting: Orthopedic Surgery

## 2024-02-29 NOTE — Discharge Instructions (Signed)

## 2024-03-02 ENCOUNTER — Other Ambulatory Visit

## 2024-03-02 ENCOUNTER — Inpatient Hospital Stay
Admission: RE | Admit: 2024-03-02 | Discharge: 2024-03-02 | Disposition: A | Discharge status: Home / Self Care | Source: Ambulatory Visit | Attending: Orthopedic Surgery

## 2024-03-22 DIAGNOSIS — E1139 Type 2 diabetes mellitus with other diabetic ophthalmic complication: Secondary | ICD-10-CM | POA: Diagnosis not present

## 2024-03-22 DIAGNOSIS — Z79899 Other long term (current) drug therapy: Secondary | ICD-10-CM | POA: Diagnosis not present

## 2024-03-22 DIAGNOSIS — E6609 Other obesity due to excess calories: Secondary | ICD-10-CM | POA: Diagnosis not present

## 2024-03-22 DIAGNOSIS — G629 Polyneuropathy, unspecified: Secondary | ICD-10-CM | POA: Diagnosis not present

## 2024-03-22 DIAGNOSIS — K219 Gastro-esophageal reflux disease without esophagitis: Secondary | ICD-10-CM | POA: Diagnosis not present

## 2024-03-22 DIAGNOSIS — E559 Vitamin D deficiency, unspecified: Secondary | ICD-10-CM | POA: Diagnosis not present

## 2024-03-22 DIAGNOSIS — E7800 Pure hypercholesterolemia, unspecified: Secondary | ICD-10-CM | POA: Diagnosis not present

## 2024-03-22 DIAGNOSIS — M797 Fibromyalgia: Secondary | ICD-10-CM | POA: Diagnosis not present

## 2024-03-22 DIAGNOSIS — R5383 Other fatigue: Secondary | ICD-10-CM | POA: Diagnosis not present

## 2024-03-22 DIAGNOSIS — D539 Nutritional anemia, unspecified: Secondary | ICD-10-CM | POA: Diagnosis not present

## 2024-03-22 DIAGNOSIS — Z419 Encounter for procedure for purposes other than remedying health state, unspecified: Secondary | ICD-10-CM | POA: Diagnosis not present

## 2024-03-22 DIAGNOSIS — E781 Pure hyperglyceridemia: Secondary | ICD-10-CM | POA: Diagnosis not present

## 2024-03-22 DIAGNOSIS — R29818 Other symptoms and signs involving the nervous system: Secondary | ICD-10-CM | POA: Diagnosis not present

## 2024-03-27 DIAGNOSIS — Z79899 Other long term (current) drug therapy: Secondary | ICD-10-CM | POA: Diagnosis not present

## 2024-04-21 DIAGNOSIS — Z419 Encounter for procedure for purposes other than remedying health state, unspecified: Secondary | ICD-10-CM | POA: Diagnosis not present

## 2024-05-07 DIAGNOSIS — Z79899 Other long term (current) drug therapy: Secondary | ICD-10-CM | POA: Diagnosis not present

## 2024-05-07 DIAGNOSIS — E7800 Pure hypercholesterolemia, unspecified: Secondary | ICD-10-CM | POA: Diagnosis not present

## 2024-05-07 DIAGNOSIS — M797 Fibromyalgia: Secondary | ICD-10-CM | POA: Diagnosis not present

## 2024-05-07 DIAGNOSIS — E781 Pure hyperglyceridemia: Secondary | ICD-10-CM | POA: Diagnosis not present

## 2024-05-07 DIAGNOSIS — E6609 Other obesity due to excess calories: Secondary | ICD-10-CM | POA: Diagnosis not present

## 2024-05-07 DIAGNOSIS — K219 Gastro-esophageal reflux disease without esophagitis: Secondary | ICD-10-CM | POA: Diagnosis not present

## 2024-05-07 DIAGNOSIS — E559 Vitamin D deficiency, unspecified: Secondary | ICD-10-CM | POA: Diagnosis not present

## 2024-05-07 DIAGNOSIS — E1139 Type 2 diabetes mellitus with other diabetic ophthalmic complication: Secondary | ICD-10-CM | POA: Diagnosis not present

## 2024-05-07 DIAGNOSIS — R252 Cramp and spasm: Secondary | ICD-10-CM | POA: Diagnosis not present

## 2024-05-07 DIAGNOSIS — G629 Polyneuropathy, unspecified: Secondary | ICD-10-CM | POA: Diagnosis not present

## 2024-05-08 ENCOUNTER — Other Ambulatory Visit (HOSPITAL_COMMUNITY): Payer: Self-pay

## 2024-05-11 DIAGNOSIS — Z79899 Other long term (current) drug therapy: Secondary | ICD-10-CM | POA: Diagnosis not present

## 2024-05-22 DIAGNOSIS — Z419 Encounter for procedure for purposes other than remedying health state, unspecified: Secondary | ICD-10-CM | POA: Diagnosis not present

## 2024-06-12 DIAGNOSIS — Z6831 Body mass index (BMI) 31.0-31.9, adult: Secondary | ICD-10-CM | POA: Diagnosis not present

## 2024-06-12 DIAGNOSIS — Z8679 Personal history of other diseases of the circulatory system: Secondary | ICD-10-CM | POA: Diagnosis not present

## 2024-06-12 DIAGNOSIS — R252 Cramp and spasm: Secondary | ICD-10-CM | POA: Diagnosis not present

## 2024-06-12 DIAGNOSIS — E6609 Other obesity due to excess calories: Secondary | ICD-10-CM | POA: Diagnosis not present

## 2024-06-12 DIAGNOSIS — G629 Polyneuropathy, unspecified: Secondary | ICD-10-CM | POA: Diagnosis not present

## 2024-06-12 DIAGNOSIS — M797 Fibromyalgia: Secondary | ICD-10-CM | POA: Diagnosis not present

## 2024-06-12 DIAGNOSIS — Z79899 Other long term (current) drug therapy: Secondary | ICD-10-CM | POA: Diagnosis not present

## 2024-06-15 DIAGNOSIS — Z79899 Other long term (current) drug therapy: Secondary | ICD-10-CM | POA: Diagnosis not present

## 2024-06-22 DIAGNOSIS — Z419 Encounter for procedure for purposes other than remedying health state, unspecified: Secondary | ICD-10-CM | POA: Diagnosis not present

## 2024-07-09 DIAGNOSIS — M797 Fibromyalgia: Secondary | ICD-10-CM | POA: Diagnosis not present

## 2024-07-09 DIAGNOSIS — K219 Gastro-esophageal reflux disease without esophagitis: Secondary | ICD-10-CM | POA: Diagnosis not present

## 2024-07-09 DIAGNOSIS — E781 Pure hyperglyceridemia: Secondary | ICD-10-CM | POA: Diagnosis not present

## 2024-07-09 DIAGNOSIS — G629 Polyneuropathy, unspecified: Secondary | ICD-10-CM | POA: Diagnosis not present

## 2024-07-09 DIAGNOSIS — R5383 Other fatigue: Secondary | ICD-10-CM | POA: Diagnosis not present

## 2024-07-09 DIAGNOSIS — E7800 Pure hypercholesterolemia, unspecified: Secondary | ICD-10-CM | POA: Diagnosis not present

## 2024-07-09 DIAGNOSIS — E1139 Type 2 diabetes mellitus with other diabetic ophthalmic complication: Secondary | ICD-10-CM | POA: Diagnosis not present

## 2024-07-09 DIAGNOSIS — E559 Vitamin D deficiency, unspecified: Secondary | ICD-10-CM | POA: Diagnosis not present

## 2024-07-09 DIAGNOSIS — F418 Other specified anxiety disorders: Secondary | ICD-10-CM | POA: Diagnosis not present

## 2024-07-09 DIAGNOSIS — D539 Nutritional anemia, unspecified: Secondary | ICD-10-CM | POA: Diagnosis not present

## 2024-07-09 DIAGNOSIS — Z79899 Other long term (current) drug therapy: Secondary | ICD-10-CM | POA: Diagnosis not present

## 2024-07-13 DIAGNOSIS — Z79899 Other long term (current) drug therapy: Secondary | ICD-10-CM | POA: Diagnosis not present

## 2024-07-14 DIAGNOSIS — Z6831 Body mass index (BMI) 31.0-31.9, adult: Secondary | ICD-10-CM | POA: Diagnosis not present

## 2024-07-14 DIAGNOSIS — Z1331 Encounter for screening for depression: Secondary | ICD-10-CM | POA: Diagnosis not present

## 2024-07-14 DIAGNOSIS — F322 Major depressive disorder, single episode, severe without psychotic features: Secondary | ICD-10-CM | POA: Diagnosis not present

## 2024-07-14 DIAGNOSIS — F411 Generalized anxiety disorder: Secondary | ICD-10-CM | POA: Diagnosis not present

## 2024-07-14 DIAGNOSIS — R45851 Suicidal ideations: Secondary | ICD-10-CM | POA: Diagnosis not present

## 2024-07-14 DIAGNOSIS — Z1339 Encounter for screening examination for other mental health and behavioral disorders: Secondary | ICD-10-CM | POA: Diagnosis not present

## 2024-07-14 DIAGNOSIS — E6609 Other obesity due to excess calories: Secondary | ICD-10-CM | POA: Diagnosis not present

## 2024-08-08 DIAGNOSIS — M797 Fibromyalgia: Secondary | ICD-10-CM | POA: Diagnosis not present

## 2024-08-08 DIAGNOSIS — N3010 Interstitial cystitis (chronic) without hematuria: Secondary | ICD-10-CM | POA: Diagnosis not present

## 2024-08-08 DIAGNOSIS — K219 Gastro-esophageal reflux disease without esophagitis: Secondary | ICD-10-CM | POA: Diagnosis not present

## 2024-08-08 DIAGNOSIS — E7800 Pure hypercholesterolemia, unspecified: Secondary | ICD-10-CM | POA: Diagnosis not present

## 2024-08-08 DIAGNOSIS — R3989 Other symptoms and signs involving the genitourinary system: Secondary | ICD-10-CM | POA: Diagnosis not present

## 2024-08-08 DIAGNOSIS — E559 Vitamin D deficiency, unspecified: Secondary | ICD-10-CM | POA: Diagnosis not present

## 2024-08-08 DIAGNOSIS — E1139 Type 2 diabetes mellitus with other diabetic ophthalmic complication: Secondary | ICD-10-CM | POA: Diagnosis not present

## 2024-08-08 DIAGNOSIS — E781 Pure hyperglyceridemia: Secondary | ICD-10-CM | POA: Diagnosis not present

## 2024-08-08 DIAGNOSIS — G629 Polyneuropathy, unspecified: Secondary | ICD-10-CM | POA: Diagnosis not present

## 2024-08-08 DIAGNOSIS — R252 Cramp and spasm: Secondary | ICD-10-CM | POA: Diagnosis not present

## 2024-08-08 DIAGNOSIS — Z79899 Other long term (current) drug therapy: Secondary | ICD-10-CM | POA: Diagnosis not present

## 2024-08-10 DIAGNOSIS — Z79899 Other long term (current) drug therapy: Secondary | ICD-10-CM | POA: Diagnosis not present

## 2024-08-11 DIAGNOSIS — F411 Generalized anxiety disorder: Secondary | ICD-10-CM | POA: Diagnosis not present

## 2024-08-11 DIAGNOSIS — R45851 Suicidal ideations: Secondary | ICD-10-CM | POA: Diagnosis not present

## 2024-08-11 DIAGNOSIS — F515 Nightmare disorder: Secondary | ICD-10-CM | POA: Diagnosis not present

## 2024-08-11 DIAGNOSIS — F322 Major depressive disorder, single episode, severe without psychotic features: Secondary | ICD-10-CM | POA: Diagnosis not present

## 2024-08-11 DIAGNOSIS — E6609 Other obesity due to excess calories: Secondary | ICD-10-CM | POA: Diagnosis not present

## 2024-08-11 DIAGNOSIS — Z6831 Body mass index (BMI) 31.0-31.9, adult: Secondary | ICD-10-CM | POA: Diagnosis not present

## 2024-08-13 ENCOUNTER — Encounter: Payer: Self-pay | Admitting: Radiology

## 2024-08-13 ENCOUNTER — Other Ambulatory Visit: Payer: Self-pay | Admitting: Medical Genetics

## 2024-08-13 DIAGNOSIS — Z006 Encounter for examination for normal comparison and control in clinical research program: Secondary | ICD-10-CM

## 2024-08-30 ENCOUNTER — Encounter (HOSPITAL_BASED_OUTPATIENT_CLINIC_OR_DEPARTMENT_OTHER): Payer: Self-pay | Admitting: Emergency Medicine

## 2024-08-30 ENCOUNTER — Other Ambulatory Visit: Payer: Self-pay

## 2024-08-30 DIAGNOSIS — Z7984 Long term (current) use of oral hypoglycemic drugs: Secondary | ICD-10-CM | POA: Diagnosis not present

## 2024-08-30 DIAGNOSIS — Z91040 Latex allergy status: Secondary | ICD-10-CM | POA: Diagnosis not present

## 2024-08-30 DIAGNOSIS — M791 Myalgia, unspecified site: Secondary | ICD-10-CM | POA: Diagnosis not present

## 2024-08-30 DIAGNOSIS — N3000 Acute cystitis without hematuria: Secondary | ICD-10-CM | POA: Diagnosis not present

## 2024-08-30 DIAGNOSIS — Z794 Long term (current) use of insulin: Secondary | ICD-10-CM | POA: Insufficient documentation

## 2024-08-30 DIAGNOSIS — R61 Generalized hyperhidrosis: Secondary | ICD-10-CM | POA: Insufficient documentation

## 2024-08-30 DIAGNOSIS — R519 Headache, unspecified: Secondary | ICD-10-CM | POA: Diagnosis not present

## 2024-08-30 DIAGNOSIS — E119 Type 2 diabetes mellitus without complications: Secondary | ICD-10-CM | POA: Diagnosis not present

## 2024-08-30 LAB — CBC
HCT: 43.5 % (ref 36.0–46.0)
Hemoglobin: 14.6 g/dL (ref 12.0–15.0)
MCH: 29.6 pg (ref 26.0–34.0)
MCHC: 33.6 g/dL (ref 30.0–36.0)
MCV: 88.2 fL (ref 80.0–100.0)
Platelets: 362 K/uL (ref 150–400)
RBC: 4.93 MIL/uL (ref 3.87–5.11)
RDW: 12.3 % (ref 11.5–15.5)
WBC: 7.7 K/uL (ref 4.0–10.5)
nRBC: 0 % (ref 0.0–0.2)

## 2024-08-30 NOTE — ED Triage Notes (Signed)
 Pt with hx of type II diabetes.  Headache, feeling unwell, only urinating once a day.  REports her sugars at home have not been higher than 200.  No n/v.  Did have diarrhea but improved with immodium. Reports chills and diaphoresis.

## 2024-08-31 ENCOUNTER — Other Ambulatory Visit: Payer: Self-pay

## 2024-08-31 ENCOUNTER — Emergency Department (HOSPITAL_BASED_OUTPATIENT_CLINIC_OR_DEPARTMENT_OTHER)
Admission: EM | Admit: 2024-08-31 | Discharge: 2024-08-31 | Disposition: A | Discharge status: Home / Self Care | Attending: Emergency Medicine | Admitting: Emergency Medicine

## 2024-08-31 DIAGNOSIS — N3000 Acute cystitis without hematuria: Secondary | ICD-10-CM

## 2024-08-31 DIAGNOSIS — R519 Headache, unspecified: Secondary | ICD-10-CM

## 2024-08-31 LAB — COMPREHENSIVE METABOLIC PANEL WITH GFR
ALT: 18 U/L (ref 0–44)
AST: 20 U/L (ref 15–41)
Albumin: 5 g/dL (ref 3.5–5.0)
Alkaline Phosphatase: 107 U/L (ref 38–126)
Anion gap: 15 (ref 5–15)
BUN: 19 mg/dL (ref 6–20)
CO2: 25 mmol/L (ref 22–32)
Calcium: 10.1 mg/dL (ref 8.9–10.3)
Chloride: 102 mmol/L (ref 98–111)
Creatinine, Ser: 1.06 mg/dL — ABNORMAL HIGH (ref 0.44–1.00)
GFR, Estimated: >60 mL/min (ref >60)
Glucose, Bld: 185 mg/dL — ABNORMAL HIGH (ref 70–99)
Potassium: 3.9 mmol/L (ref 3.5–5.1)
Sodium: 141 mmol/L (ref 135–145)
Total Bilirubin: 0.4 mg/dL (ref 0.0–1.2)
Total Protein: 8.6 g/dL — ABNORMAL HIGH (ref 6.5–8.1)

## 2024-08-31 LAB — URINALYSIS, ROUTINE W REFLEX MICROSCOPIC
Glucose, UA: NEGATIVE mg/dL
Hgb urine dipstick: NEGATIVE
Ketones, ur: 15 mg/dL — AB
Nitrite: NEGATIVE
Protein, ur: 30 mg/dL — AB
Specific Gravity, Urine: >=1.03 (ref 1.005–1.030)
pH: 5.5 (ref 5.0–8.0)

## 2024-08-31 LAB — RESP PANEL BY RT-PCR (RSV, FLU A&B, COVID)  RVPGX2
Influenza A by PCR: NEGATIVE
Influenza B by PCR: NEGATIVE
Resp Syncytial Virus by PCR: NEGATIVE
SARS Coronavirus 2 by RT PCR: NEGATIVE

## 2024-08-31 LAB — URINALYSIS, MICROSCOPIC (REFLEX)

## 2024-08-31 LAB — LIPASE, BLOOD: Lipase: 37 U/L (ref 11–51)

## 2024-08-31 MED ORDER — SODIUM CHLORIDE 0.9 % IV SOLN
1.0000 g | Freq: Once | INTRAVENOUS | Status: AC
  Administered 2024-08-31: 1 g via INTRAVENOUS
  Filled 2024-08-31: qty 10

## 2024-08-31 MED ORDER — KETOROLAC TROMETHAMINE 15 MG/ML IJ SOLN
15.0000 mg | Freq: Once | INTRAMUSCULAR | Status: AC
  Administered 2024-08-31: 15 mg via INTRAVENOUS
  Filled 2024-08-31: qty 1

## 2024-08-31 MED ORDER — PROCHLORPERAZINE EDISYLATE 10 MG/2ML IJ SOLN
10.0000 mg | Freq: Once | INTRAMUSCULAR | Status: AC
  Administered 2024-08-31: 10 mg via INTRAVENOUS
  Filled 2024-08-31: qty 2

## 2024-08-31 MED ORDER — SODIUM CHLORIDE 0.9 % IV BOLUS
1000.0000 mL | Freq: Once | INTRAVENOUS | Status: AC
  Administered 2024-08-31: 1000 mL via INTRAVENOUS

## 2024-08-31 MED ORDER — FLUCONAZOLE 150 MG PO TABS: 150.0000 mg | ORAL_TABLET | Freq: Once | ORAL | 0 refills | Status: AC

## 2024-08-31 MED ORDER — CEPHALEXIN 500 MG PO CAPS
500.0000 mg | ORAL_CAPSULE | Freq: Two times a day (BID) | ORAL | 0 refills | Status: AC
  Filled 2024-08-31: qty 14, 7d supply, fill #0

## 2024-08-31 NOTE — ED Provider Notes (Signed)
 [place] [facility]  Provider Note  CSN: [id number] Arrival date & time: 08/30/24 2325  History Chief Complaint  Patient presents with   Headache    Michelle Gibson is a 48 y.o. female with history of DM on insulin , fibromyalgia on Norco reports 3 days of feeling poorly with posterior headache, general malaise/myalgias, no known fever. She reports feeling dehydrated despite drinking plenty of fluids and has had decreased urine output. No vomiting, had some diarrhea but that improved. She reports chills and diaphoresis.    Home Medications Prior to Admission medications   Medication Sig Start Date End Date Taking? Authorizing Provider  cephALEXin  (KEFLEX ) 500 MG capsule Take 1 capsule (500 mg total) by mouth 2 (two) times daily for 7 days. 08/31/24 09/07/24 Yes Roselyn Carlin NOVAK, MD  Accu-Chek Softclix Lancets lancets Use 4 (four) times daily -  before meals and at bedtime. 04/05/23   Jaycee Greig PARAS, NP  atorvastatin  (LIPITOR) 20 MG tablet Take 1 tablet (20 mg total) by mouth daily. 05/25/21   Fleming, Zelda W, NP  atorvastatin  (LIPITOR) 40 MG tablet Take 40 mg by mouth at bedtime. 12/22/23   [provider]  benzonatate  (TESSALON ) 100 MG capsule Take 1 capsule by mouth every 8 (eight) hours. Patient not taking: Reported on 02/24/2024 09/15/22   [provider]  Blood Glucose Monitoring Suppl (ACCU-CHEK GUIDE ME) w/Device KIT Use as directed 4 (four) times daily -  before meals and at bedtime. 04/05/23   Jaycee Greig PARAS, NP  Blood Glucose Monitoring Suppl (ONETOUCH VERIO) w/Device KIT Use to check blood sugar TID. 12/10/20   Newlin, Enobong, MD  buprenorphine (BUTRANS) 10 MCG/HR PTWK APPLY 1 PATCH TO THE SKIN ONCE A WEEK 11/29/23   [provider]  buprenorphine (BUTRANS) 15 MCG/HR 1 patch once a week. 01/26/24   [provider]  busPIRone (BUSPAR) 10 MG tablet Take 10 mg by mouth. Patient not taking: Reported on 02/24/2024  11/03/23   [provider]  BUTRANS 5 MCG/HR PTWK 1 patch once a week. Patient not taking: Reported on 02/24/2024 08/31/23   [provider]  Continuous Blood Gluc Transmit (DEXCOM G6 TRANSMITTER) MISC 1 each by Does not apply route 4 (four) times daily -  before meals and at bedtime. 06/19/21   Jaycee Greig PARAS, NP  Continuous Glucose Sensor (DEXCOM G7 SENSOR) MISC USE AS DIRECTED 01/30/24   Dartha Ernst, MD  Continuous Glucose Sensor (DEXCOM G7 SENSOR) MISC 1 Device by Does not apply route continuous. 01/10/24   Motwani, Komal, MD  cyclobenzaprine  (FLEXERIL ) 5 MG tablet TAKE 1 TABLET (5 MG TOTAL) BY MOUTH 2 (TWO) TIMES DAILY AS NEEDED FOR MUSCLE SPASMS. Patient not taking: Reported on 02/24/2024 06/16/23   Jaycee Greig PARAS, NP  fluconazole  (DIFLUCAN ) 150 MG tablet Take 1 tablet by mouth once. Repeat in 3 days if needed. Patient not taking: Reported on 02/24/2024 06/29/22   Gladis Elsie BROCKS, PA-C  FLUoxetine  (PROZAC ) 10 MG capsule Take 2 capsules (20 mg total) by mouth at bedtime. Patient not taking: Reported on 02/24/2024 02/11/23   Jaycee Greig PARAS, NP  fluticasone  (FLONASE ) 50 MCG/ACT nasal spray Place 1 spray into both nostrils daily. 08/15/22   Hazen Darryle BRAVO, FNP  gabapentin  (NEURONTIN ) 100 MG capsule     [provider]  gabapentin  (NEURONTIN ) 300 MG capsule Take 2 capsules (600 mg total) by mouth 4 (four) times daily. 07/08/23   Jaycee Greig PARAS, NP  gabapentin  (NEURONTIN )  600 MG tablet Take 600 mg by mouth 3 (three) times daily. 01/20/24   [provider]  Glucagon  (BAQSIMI  ONE PACK) 3 MG/DOSE POWD Place 1 Device into the nose as needed (Low blood sugar with impaired consciousness). 12/29/23   Motwani, Komal, MD  glucose blood (TRUE METRIX BLOOD GLUCOSE TEST) test strip Use 4 (four) times daily -  before meals and at bedtime. Use as instructed 04/05/23   Jaycee Greig PARAS, NP  HYDROcodone -acetaminophen  (NORCO) 10-325 MG tablet Take 1 tablet by mouth 4 (four) times daily as needed.  02/20/24   [provider]  hydrOXYzine  (VISTARIL ) 25 MG capsule Take 1 capsule (25 mg total) by mouth every 8 (eight) hours as needed. Patient not taking: Reported on 02/24/2024 02/11/23   Jaycee Greig PARAS, NP  ibuprofen  (ADVIL ) 800 MG tablet TAKE 1 TABLET (800 MG TOTAL) BY MOUTH 3 (THREE) TIMES DAILY. X 7 DAYS THEN AS NEEDED FOR PAIN EAT PRIOR TO TAKING THIS MEDICATION    [provider]  insulin  glargine (LANTUS ) 100 UNIT/ML Solostar Pen Inject 34 units into the skin once daily. After 1 week, if fasting blood sugar > 200, increase to 40 units in the morning. 02/15/23   Newlin, Enobong, MD  insulin  lispro (HUMALOG ) 100 UNIT/ML KwikPen Inject 10 Units into the skin 3 (three) times daily. If blood sugar before you eat is under 150, do not take. 02/15/23   Newlin, Enobong, MD  Insulin  Pen Needle 32G X 4 MM MISC Use a total of 4 pen needles a day. 02/15/23   Newlin, Enobong, MD  ipratropium-albuterol  (DUONEB) 0.5-2.5 (3) MG/3ML SOLN Take 3 mLs by nebulization every 6 (six) hours as needed (wheezing). Patient not taking: Reported on 02/24/2024    [provider]  metFORMIN  (GLUCOPHAGE ) 1000 MG tablet Take 1 tablet (1,000 mg total) by mouth 2 (two) times daily with a meal. 04/21/23 02/24/24  Jaycee Greig PARAS, NP  metFORMIN  (GLUCOPHAGE ) 500 MG tablet     [provider]  naloxone (NARCAN) nasal spray 4 mg/0.1 mL SMARTSIG:Both Nares    [provider]  nitrofurantoin , macrocrystal-monohydrate, (MACROBID ) 100 MG capsule Take 1 capsule (100 mg total) by mouth 2 (two) times daily. Patient not taking: Reported on 02/24/2024 06/29/22   Gladis Elsie BROCKS, PA-C  nystatin  cream (MYCOSTATIN ) Apply topically 2 (two) times daily as needed (apply to rash). Patient not taking: Reported on 02/24/2024 06/09/21   Vann, Jessica U, DO  omeprazole (PRILOSEC) 40 MG capsule Take 40 mg by mouth daily. 02/20/24   [provider]  ondansetron  (ZOFRAN -ODT) 4 MG disintegrating tablet Place 1 tab under  the tongue every 4 hours as needed for nausea and vomitting 10/22/21   Suzette Pac, MD  OneTouch Delica Lancets 33G MISC Use to check blood sugar TID. 12/10/20   Newlin, Enobong, MD  oseltamivir  (TAMIFLU ) 75 MG capsule Take 1 capsule (75 mg total) by mouth every 12 (twelve) hours. Patient not taking: Reported on 02/24/2024 09/05/21   Billy Asberry FALCON, PA-C  prazosin (MINIPRESS) 1 MG capsule SMARTSIG:1 Capsule(s) By Mouth Every Evening Patient not taking: Reported on 02/24/2024 09/29/23   [provider]  propafenone (RYTHMOL SR) 225 MG 12 hr capsule  12/30/23   [provider]  propafenone (RYTHMOL) 150 MG tablet Take 150 mg by mouth 3 (three) times daily. 09/12/23   [provider]  QUEtiapine (SEROQUEL) 25 MG tablet Take 25 mg by mouth 2 (two) times daily. Patient not taking: Reported on 02/24/2024 09/29/23   [provider]  SUMAtriptan  (IMITREX ) 25 MG tablet Take 25 mg (1 tablet) total by mouth at the start of the headache. May repeat in 2 hours x 1 if headache persists. Max of 2 tabs/24 hours Patient not taking: Reported on 02/24/2024 08/18/21   Jaycee Greig PARAS, NP  tamsulosin  (FLOMAX ) 0.4 MG CAPS capsule Take 1 capsule (0.4 mg total) by mouth daily. 08/21/22   Zammit, Joseph, MD  tirzepatide  (MOUNJARO ) 5 MG/0.5ML Pen Inject 5 mg into the skin once a week. 12/29/23   Motwani, Komal, MD  tiZANidine (ZANAFLEX) 2 MG tablet Take 2 mg by mouth 3 (three) times daily as needed. 11/29/23   [provider]  tiZANidine (ZANAFLEX) 4 MG tablet Take 4 mg by mouth 3 (three) times daily.    [provider]  topiramate  (TOPAMAX ) 50 MG tablet Take 1 tablet (50 mg total) by mouth daily as needed (migraine). Patient not taking: Reported on 01/19/2023 06/09/21 06/09/22  Vann, Jessica U, DO  traMADol  (ULTRAM ) 50 MG tablet Take 1 tablet (50 mg total) by mouth 3 (three) times daily as needed. Patient not taking: Reported on 02/24/2024 01/13/23   Jule Ronal CROME, PA-C  VRAYLAR  1.5 MG capsule Take 1.5 mg by mouth. Patient not taking: Reported on 02/24/2024 09/29/23   [provider]  metoCLOPramide  (REGLAN ) 10 MG tablet Take 1 tablet (10 mg total) by mouth every 8 (eight) hours as needed for nausea or vomiting (headache). Patient not taking: Reported on 07/14/2020 07/07/20 07/17/20  Petrucelli, Samantha R, PA-C  traZODone  (DESYREL ) 50 MG tablet Take 0.5-1 tablets (25-50 mg total) by mouth at bedtime as needed for sleep. 06/04/19 07/13/19  Fulp, Lannie, MD     Allergies    Penicillamine, Ciprofloxacin, Lyrica  cr [pregabalin  er], Doxycycline , Flecainide, Latex, Penicillin g, and Penicillins   Review of Systems   Review of Systems Please see HPI for pertinent positives and negatives  Physical Exam BP (!) 143/86 (BP Location: Right Arm)   Pulse (!) 112   Temp 99.1 F (37.3 C) (Oral)   Resp 20   SpO2 96%   Physical Exam Vitals and nursing note reviewed.  Constitutional:      Appearance: Normal appearance.  HENT:     Head: Normocephalic and atraumatic.     Nose: Nose normal.     Mouth/Throat:     Mouth: Mucous membranes are moist.  Eyes:     Extraocular Movements: Extraocular movements intact.     Conjunctiva/sclera: Conjunctivae normal.  Cardiovascular:     Rate and Rhythm: Normal rate.  Pulmonary:     Effort: Pulmonary effort is normal.     Breath sounds: Normal breath sounds.  Abdominal:     General: Abdomen is flat.     Palpations: Abdomen is soft.     Tenderness: There is no abdominal tenderness.  Musculoskeletal:        General: No swelling. Normal range of motion.     Cervical back: Neck supple.  Skin:    General: Skin is warm and dry.  Neurological:     General: No focal deficit present.     Mental Status: She is alert and oriented to person, place, and time.     Sensory: No sensory deficit.     Motor: No weakness.  Psychiatric:        Mood and Affect: Mood normal.     ED Results / Procedures / Treatments    EKG None  Procedures Procedures  Medications Ordered in the ED  Medications  cefTRIAXone  (ROCEPHIN ) 1 g in sodium chloride  0.9 % 100 mL IVPB (has no administration in time range)  sodium chloride  0.9 % bolus 1,000 mL (0 mLs Intravenous Stopped 08/31/24 0210)  ketorolac  (TORADOL ) 15 MG/ML injection 15 mg (15 mg Intravenous Given 08/31/24 0109)  prochlorperazine  (COMPAZINE ) injection 10 mg (10 mg Intravenous Given 08/31/24 0109)    Initial Impression and Plan  Patient here with nonspecific feelings of being unwell with a posterior headache, some general malaise and chills. Suspect a viral illness Labs done in triage show unremarkable CBC, CMP and lipase. Will add Covid/Flu/RSV swab, give IVF and headache cocktail.   ED Course   Clinical Course as of 08/31/24 0242  Fri Aug 31, 2024  0152 Covid/Flu/RSV swab is neg  [CS]  0238 UA with some signs of infection and mildly increased concentration. Will give a dose of Rocephin  then plan discharge with Rx for keflex , diflucan  if needed, Zofran  for nausea and PCP follow up, RTED for any other concerns.   [CS]    Clinical Course User Index [CS] Roselyn Carlin NOVAK, MD     MDM Rules/Calculators/A&P Medical Decision Making Problems Addressed: Acute cystitis without hematuria: acute illness or injury Acute nonintractable headache, unspecified headache type: acute illness or injury  Amount and/or Complexity of Data Reviewed Labs: ordered. Decision-making details documented in ED Course.  Risk Prescription drug management.     Final Clinical Impression(s) / ED Diagnoses Final diagnoses:  Acute cystitis without hematuria  Acute nonintractable headache, unspecified headache type    Rx / DC Orders ED Discharge Orders          Ordered    cephALEXin  (KEFLEX ) 500 MG capsule  2 times daily        08/31/24 0242             Roselyn Carlin NOVAK, MD 08/31/24 [phone]

## 2024-09-05 ENCOUNTER — Emergency Department (HOSPITAL_COMMUNITY)
Admission: EM | Admit: 2024-09-05 | Discharge: 2024-09-05 | Disposition: A | Discharge status: Home / Self Care | Attending: Emergency Medicine | Admitting: Emergency Medicine

## 2024-09-05 ENCOUNTER — Encounter (HOSPITAL_COMMUNITY): Payer: Self-pay

## 2024-09-05 ENCOUNTER — Emergency Department (HOSPITAL_COMMUNITY)

## 2024-09-05 DIAGNOSIS — E119 Type 2 diabetes mellitus without complications: Secondary | ICD-10-CM | POA: Diagnosis not present

## 2024-09-05 DIAGNOSIS — R569 Unspecified convulsions: Secondary | ICD-10-CM | POA: Diagnosis not present

## 2024-09-05 DIAGNOSIS — Z794 Long term (current) use of insulin: Secondary | ICD-10-CM | POA: Diagnosis not present

## 2024-09-05 DIAGNOSIS — Z7951 Long term (current) use of inhaled steroids: Secondary | ICD-10-CM | POA: Insufficient documentation

## 2024-09-05 DIAGNOSIS — Z7984 Long term (current) use of oral hypoglycemic drugs: Secondary | ICD-10-CM | POA: Diagnosis not present

## 2024-09-05 DIAGNOSIS — J45909 Unspecified asthma, uncomplicated: Secondary | ICD-10-CM | POA: Diagnosis not present

## 2024-09-05 DIAGNOSIS — R0689 Other abnormalities of breathing: Secondary | ICD-10-CM | POA: Diagnosis not present

## 2024-09-05 DIAGNOSIS — R231 Pallor: Secondary | ICD-10-CM | POA: Diagnosis not present

## 2024-09-05 DIAGNOSIS — R9431 Abnormal electrocardiogram [ECG] [EKG]: Secondary | ICD-10-CM | POA: Diagnosis not present

## 2024-09-05 DIAGNOSIS — I959 Hypotension, unspecified: Secondary | ICD-10-CM | POA: Diagnosis not present

## 2024-09-05 DIAGNOSIS — Z91040 Latex allergy status: Secondary | ICD-10-CM | POA: Insufficient documentation

## 2024-09-05 LAB — COMPREHENSIVE METABOLIC PANEL WITH GFR
ALT: 14 U/L (ref 0–44)
AST: 23 U/L (ref 15–41)
Albumin: 4.3 g/dL (ref 3.5–5.0)
Alkaline Phosphatase: 82 U/L (ref 38–126)
Anion gap: 11 (ref 5–15)
BUN: 15 mg/dL (ref 6–20)
CO2: 26 mmol/L (ref 22–32)
Calcium: 9.2 mg/dL (ref 8.9–10.3)
Chloride: 104 mmol/L (ref 98–111)
Creatinine, Ser: 0.77 mg/dL (ref 0.44–1.00)
GFR, Estimated: >60 mL/min (ref >60)
Glucose, Bld: 204 mg/dL — ABNORMAL HIGH (ref 70–99)
Potassium: 3.2 mmol/L — ABNORMAL LOW (ref 3.5–5.1)
Sodium: 141 mmol/L (ref 135–145)
Total Bilirubin: 0.3 mg/dL (ref 0.0–1.2)
Total Protein: 7.5 g/dL (ref 6.5–8.1)

## 2024-09-05 LAB — CBC WITH DIFFERENTIAL/PLATELET
Abs Immature Granulocytes: 0.02 K/uL (ref 0.00–0.07)
Basophils Absolute: 0.1 K/uL (ref 0.0–0.1)
Basophils Relative: 1 %
Eosinophils Absolute: 0.1 K/uL (ref 0.0–0.5)
Eosinophils Relative: 2 %
HCT: 40.2 % (ref 36.0–46.0)
Hemoglobin: 13.6 g/dL (ref 12.0–15.0)
Immature Granulocytes: 0 %
Lymphocytes Relative: 40 %
Lymphs Abs: 2.8 K/uL (ref 0.7–4.0)
MCH: 30.2 pg (ref 26.0–34.0)
MCHC: 33.8 g/dL (ref 30.0–36.0)
MCV: 89.1 fL (ref 80.0–100.0)
Monocytes Absolute: 0.3 K/uL (ref 0.1–1.0)
Monocytes Relative: 4 %
Neutro Abs: 3.8 K/uL (ref 1.7–7.7)
Neutrophils Relative %: 53 %
Platelets: 377 K/uL (ref 150–400)
RBC: 4.51 MIL/uL (ref 3.87–5.11)
RDW: 12.7 % (ref 11.5–15.5)
WBC: 7.1 K/uL (ref 4.0–10.5)
nRBC: 0 % (ref 0.0–0.2)

## 2024-09-05 LAB — ETHANOL: Alcohol, Ethyl (B): <15 mg/dL (ref <15)

## 2024-09-05 LAB — URINE DRUG SCREEN
Amphetamines: NEGATIVE
Barbiturates: NEGATIVE
Benzodiazepines: POSITIVE — AB
Cocaine: NEGATIVE
Fentanyl: NEGATIVE
Methadone Scn, Ur: NEGATIVE
Opiates: POSITIVE — AB
Tetrahydrocannabinol: NEGATIVE

## 2024-09-05 LAB — URINALYSIS, ROUTINE W REFLEX MICROSCOPIC
Bilirubin Urine: NEGATIVE
Glucose, UA: 50 mg/dL — AB
Hgb urine dipstick: NEGATIVE
Ketones, ur: NEGATIVE mg/dL
Nitrite: NEGATIVE
Protein, ur: NEGATIVE mg/dL
Specific Gravity, Urine: 1.019 (ref 1.005–1.030)
pH: 5 (ref 5.0–8.0)

## 2024-09-05 LAB — MAGNESIUM: Magnesium: 2.1 mg/dL (ref 1.7–2.4)

## 2024-09-05 LAB — CBG MONITORING, ED: Glucose-Capillary: 230 mg/dL — ABNORMAL HIGH (ref 70–99)

## 2024-09-05 LAB — HCG, SERUM, QUALITATIVE: Preg, Serum: NEGATIVE

## 2024-09-05 MED ORDER — LORAZEPAM 2 MG/ML IJ SOLN
INTRAMUSCULAR | Status: AC
  Filled 2024-09-05: qty 1

## 2024-09-05 MED ORDER — TOPIRAMATE 50 MG PO TABS
50.0000 mg | ORAL_TABLET | Freq: Two times a day (BID) | ORAL | 1 refills | Status: AC
  Filled 2024-09-05: qty 60, 30d supply, fill #0

## 2024-09-05 MED ORDER — TOPIRAMATE 25 MG PO TABS
50.0000 mg | ORAL_TABLET | Freq: Once | ORAL | Status: AC
Start: 2024-09-05 — End: 2024-09-05
  Administered 2024-09-05: 50 mg via ORAL
  Filled 2024-09-05: qty 2

## 2024-09-05 MED ORDER — SODIUM CHLORIDE 0.9 % IV BOLUS
1000.0000 mL | Freq: Once | INTRAVENOUS | Status: AC
  Administered 2024-09-05: 1000 mL via INTRAVENOUS

## 2024-09-05 MED ORDER — LORAZEPAM 2 MG/ML IJ SOLN
2.0000 mg | Freq: Once | INTRAMUSCULAR | Status: AC
Start: 2024-09-05 — End: 2024-09-05
  Administered 2024-09-05: 2 mg via INTRAMUSCULAR

## 2024-09-05 NOTE — ED Triage Notes (Signed)
 Pt BIB ems after a witnessed seizure that lasted about 10 mins per daughter. Pt does not remember anything from the event. Is A&Ox4 now. States she had a near syncope episode 1 week ago. Hasn't been urinated normally and hasn't been feeling great after that episode, has had tremors since then.

## 2024-09-05 NOTE — Discharge Instructions (Signed)
 I would recommend taking topiramate  once every 12 hours for seizure prevention.  Need to follow-up closely with neurology. 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  It is important that you avoid any activities that would put you at any focal in danger if you were to have another seizure.  Return emergency room with new or worsening symptoms.

## 2024-09-05 NOTE — ED Provider Notes (Signed)
 [place] EMERGENCY DEPARTMENT AT [facility] Provider Note   CSN: [id number] Arrival date & time: 09/05/24  1145     Patient presents with: Seizures   Michelle Gibson is a 48 y.o. female patient with past medical history of WPW, DM, asthma, HLD presenting with ER with complaint of seizure.  Patient reports that she had a seizure that was witnessed by her daughter for approximately 5 to 10 minutes at home.  When speaking to the patient's daughter she reports that she had involuntary jerking motion.  She denies any incontinence or lateral tongue biting with this.  She was sitting on the couch and did not fall.  She is currently complaining of headache and appears back to baseline. While speaking with patient she had an episode where her eyes deviated to the left and she became unresponsive for questioning and rigid for approximately 2 minutes and then slowly able to answer questions.    HPI     Prior to Admission medications   Medication Sig Start Date End Date Taking? Authorizing Provider  topiramate  (TOPAMAX ) 50 MG tablet Take 1 tablet (50 mg total) by mouth 2 (two) times daily. 09/05/24  Yes Sherea Liptak, Warren SAILOR, PA-C  Accu-Chek Softclix Lancets lancets Use 4 (four) times daily -  before meals and at bedtime. 04/05/23   Jaycee Greig PARAS, NP  atorvastatin  (LIPITOR) 20 MG tablet Take 1 tablet (20 mg total) by mouth daily. 05/25/21   Fleming, Zelda W, NP  atorvastatin  (LIPITOR) 40 MG tablet Take 40 mg by mouth at bedtime. 12/22/23   [provider]  benzonatate  (TESSALON ) 100 MG capsule Take 1 capsule by mouth every 8 (eight) hours. Patient not taking: Reported on 02/24/2024 09/15/22   [provider]  Blood Glucose Monitoring Suppl (ACCU-CHEK GUIDE ME) w/Device KIT Use as directed 4 (four) times daily -  before meals and at bedtime. 04/05/23   Jaycee Greig PARAS, NP  Blood Glucose Monitoring Suppl (ONETOUCH VERIO) w/Device KIT Use to check blood sugar TID. 12/10/20   Newlin,  Enobong, MD  buprenorphine (BUTRANS) 10 MCG/HR PTWK APPLY 1 PATCH TO THE SKIN ONCE A WEEK 11/29/23   [provider]  buprenorphine (BUTRANS) 15 MCG/HR 1 patch once a week. 01/26/24   [provider]  busPIRone (BUSPAR) 10 MG tablet Take 10 mg by mouth. Patient not taking: Reported on 02/24/2024 11/03/23   [provider]  BUTRANS 5 MCG/HR PTWK 1 patch once a week. Patient not taking: Reported on 02/24/2024 08/31/23   [provider]  cephALEXin  (KEFLEX ) 500 MG capsule Take 1 capsule (500 mg total) by mouth 2 (two) times daily for 7 days. 08/31/24 09/07/24  Roselyn Carlin NOVAK, MD  Continuous Blood Gluc Transmit (DEXCOM G6 TRANSMITTER) MISC 1 each by Does not apply route 4 (four) times daily -  before meals and at bedtime. 06/19/21   Jaycee Greig PARAS, NP  Continuous Glucose Sensor (DEXCOM G7 SENSOR) MISC USE AS DIRECTED 01/30/24   Dartha Ernst, MD  Continuous Glucose Sensor (DEXCOM G7 SENSOR) MISC 1 Device by Does not apply route continuous. 01/10/24   Motwani, Komal, MD  cyclobenzaprine  (FLEXERIL ) 5 MG tablet TAKE 1 TABLET (5 MG TOTAL) BY MOUTH 2 (TWO) TIMES DAILY AS NEEDED FOR MUSCLE SPASMS. Patient not taking: Reported on 02/24/2024 06/16/23   Jaycee Greig PARAS, NP  FLUoxetine  (PROZAC ) 10 MG capsule Take 2 capsules (20 mg total) by mouth at bedtime. Patient not taking: Reported on 02/24/2024 02/11/23   Massey, Amy  J, NP  fluticasone  (FLONASE ) 50 MCG/ACT nasal spray Place 1 spray into both nostrils daily. 08/15/22   Hazen Darryle BRAVO, FNP  gabapentin  (NEURONTIN ) 100 MG capsule     [provider]  gabapentin  (NEURONTIN ) 300 MG capsule Take 2 capsules (600 mg total) by mouth 4 (four) times daily. 07/08/23   Jaycee Greig PARAS, NP  gabapentin  (NEURONTIN ) 600 MG tablet Take 600 mg by mouth 3 (three) times daily. 01/20/24   [provider]  Glucagon  (BAQSIMI  ONE PACK) 3 MG/DOSE POWD Place 1 Device into the nose as needed (Low blood sugar with impaired consciousness). 12/29/23    Motwani, Komal, MD  glucose blood (TRUE METRIX BLOOD GLUCOSE TEST) test strip Use 4 (four) times daily -  before meals and at bedtime. Use as instructed 04/05/23   Jaycee Greig PARAS, NP  HYDROcodone -acetaminophen  (NORCO) 10-325 MG tablet Take 1 tablet by mouth 4 (four) times daily as needed. 02/20/24   [provider]  hydrOXYzine  (VISTARIL ) 25 MG capsule Take 1 capsule (25 mg total) by mouth every 8 (eight) hours as needed. Patient not taking: Reported on 02/24/2024 02/11/23   Jaycee Greig PARAS, NP  ibuprofen  (ADVIL ) 800 MG tablet TAKE 1 TABLET (800 MG TOTAL) BY MOUTH 3 (THREE) TIMES DAILY. X 7 DAYS THEN AS NEEDED FOR PAIN EAT PRIOR TO TAKING THIS MEDICATION    [provider]  insulin  glargine (LANTUS ) 100 UNIT/ML Solostar Pen Inject 34 units into the skin once daily. After 1 week, if fasting blood sugar > 200, increase to 40 units in the morning. 02/15/23   Newlin, Enobong, MD  insulin  lispro (HUMALOG ) 100 UNIT/ML KwikPen Inject 10 Units into the skin 3 (three) times daily. If blood sugar before you eat is under 150, do not take. 02/15/23   Newlin, Enobong, MD  Insulin  Pen Needle 32G X 4 MM MISC Use a total of 4 pen needles a day. 02/15/23   Newlin, Enobong, MD  ipratropium-albuterol  (DUONEB) 0.5-2.5 (3) MG/3ML SOLN Take 3 mLs by nebulization every 6 (six) hours as needed (wheezing). Patient not taking: Reported on 02/24/2024    [provider]  metFORMIN  (GLUCOPHAGE ) 1000 MG tablet Take 1 tablet (1,000 mg total) by mouth 2 (two) times daily with a meal. 04/21/23 02/24/24  Jaycee Greig PARAS, NP  metFORMIN  (GLUCOPHAGE ) 500 MG tablet     [provider]  naloxone (NARCAN) nasal spray 4 mg/0.1 mL SMARTSIG:Both Nares    [provider]  nitrofurantoin , macrocrystal-monohydrate, (MACROBID ) 100 MG capsule Take 1 capsule (100 mg total) by mouth 2 (two) times daily. Patient not taking: Reported on 02/24/2024 06/29/22   Gladis Elsie BROCKS, PA-C  nystatin  cream (MYCOSTATIN ) Apply topically 2  (two) times daily as needed (apply to rash). Patient not taking: Reported on 02/24/2024 06/09/21   Vann, Jessica U, DO  omeprazole (PRILOSEC) 40 MG capsule Take 40 mg by mouth daily. 02/20/24   [provider]  ondansetron  (ZOFRAN -ODT) 4 MG disintegrating tablet Place 1 tab under the tongue every 4 hours as needed for nausea and vomitting 10/22/21   Suzette Pac, MD  OneTouch Delica Lancets 33G MISC Use to check blood sugar TID. 12/10/20   Newlin, Enobong, MD  oseltamivir  (TAMIFLU ) 75 MG capsule Take 1 capsule (75 mg total) by mouth every 12 (twelve) hours. Patient not taking: Reported on 02/24/2024 09/05/21   Billy Asberry FALCON, PA-C  prazosin (MINIPRESS) 1 MG capsule SMARTSIG:1 Capsule(s) By Mouth Every Evening Patient not taking: Reported on 02/24/2024 09/29/23   [provider]  propafenone (RYTHMOL SR) 225 MG 12 hr capsule  12/30/23   [provider]  propafenone (RYTHMOL) 150 MG tablet Take 150 mg by mouth 3 (three) times daily. 09/12/23   [provider]  QUEtiapine (SEROQUEL) 25 MG tablet Take 25 mg by mouth 2 (two) times daily. Patient not taking: Reported on 02/24/2024 09/29/23   [provider]  SUMAtriptan  (IMITREX ) 25 MG tablet Take 25 mg (1 tablet) total by mouth at the start of the headache. May repeat in 2 hours x 1 if headache persists. Max of 2 tabs/24 hours Patient not taking: Reported on 02/24/2024 08/18/21   Jaycee Greig PARAS, NP  tamsulosin  (FLOMAX ) 0.4 MG CAPS capsule Take 1 capsule (0.4 mg total) by mouth daily. 08/21/22   Zammit, Joseph, MD  tirzepatide  (MOUNJARO ) 5 MG/0.5ML Pen Inject 5 mg into the skin once a week. 12/29/23   Motwani, Komal, MD  tiZANidine (ZANAFLEX) 2 MG tablet Take 2 mg by mouth 3 (three) times daily as needed. 11/29/23   [provider]  tiZANidine (ZANAFLEX) 4 MG tablet Take 4 mg by mouth 3 (three) times daily.    [provider]  traMADol  (ULTRAM ) 50 MG tablet Take 1 tablet (50 mg total) by mouth 3 (three)  times daily as needed. Patient not taking: Reported on 02/24/2024 01/13/23   Jule Ronal CROME, PA-C  VRAYLAR 1.5 MG capsule Take 1.5 mg by mouth. Patient not taking: Reported on 02/24/2024 09/29/23   [provider]  metoCLOPramide  (REGLAN ) 10 MG tablet Take 1 tablet (10 mg total) by mouth every 8 (eight) hours as needed for nausea or vomiting (headache). Patient not taking: Reported on 07/14/2020 07/07/20 07/17/20  Petrucelli, Samantha R, PA-C  traZODone  (DESYREL ) 50 MG tablet Take 0.5-1 tablets (25-50 mg total) by mouth at bedtime as needed for sleep. 06/04/19 07/13/19  Fulp, Lannie, MD    Allergies: Penicillamine, Ciprofloxacin, Lyrica  cr [pregabalin  er], Doxycycline , Flecainide, Latex, Penicillin g, and Penicillins    Review of Systems  Neurological:  Positive for seizures.    Updated Vital Signs BP 130/74   Pulse 84   Temp 98.2 F (36.8 C)   Resp 20   SpO2 97%   Physical Exam Vitals and nursing note reviewed.  Constitutional:      General: She is not in acute distress.    Appearance: She is not toxic-appearing.  HENT:     Head: Normocephalic and atraumatic.  Eyes:     General: No scleral icterus.    Conjunctiva/sclera: Conjunctivae normal.  Cardiovascular:     Rate and Rhythm: Normal rate and regular rhythm.     Pulses: Normal pulses.     Heart sounds: Normal heart sounds.  Pulmonary:     Effort: Pulmonary effort is normal. No respiratory distress.     Breath sounds: Normal breath sounds.  Abdominal:     General: Abdomen is flat. Bowel sounds are normal.     Palpations: Abdomen is soft.     Tenderness: There is no abdominal tenderness.  Skin:    General: Skin is warm and dry.     Findings: No lesion.  Neurological:     General: No focal deficit present.     Mental Status: She is alert and oriented to person, place, and time. Mental status is at baseline.     Comments: No focal neurological deficit.     (all labs ordered are listed, but only abnormal results  are displayed) Labs Reviewed  COMPREHENSIVE METABOLIC PANEL  WITH GFR - Abnormal; Notable for the following components:      Result Value   Potassium 3.2 (*)    Glucose, Bld 204 (*)    All other components within normal limits  URINE DRUG SCREEN - Abnormal; Notable for the following components:   Opiates POSITIVE (*)    Benzodiazepines POSITIVE (*)    All other components within normal limits  URINALYSIS, ROUTINE W REFLEX MICROSCOPIC - Abnormal; Notable for the following components:   APPearance HAZY (*)    Glucose, UA 50 (*)    Leukocytes,Ua MODERATE (*)    Bacteria, UA FEW (*)    All other components within normal limits  CBG MONITORING, ED - Abnormal; Notable for the following components:   Glucose-Capillary 230 (*)    All other components within normal limits  CBC WITH DIFFERENTIAL/PLATELET  MAGNESIUM   ETHANOL  HCG, SERUM, QUALITATIVE    EKG: EKG Interpretation Date/Time:  Wednesday September 05 2024 12:10:23 EST Ventricular Rate:  82 PR Interval:  121 QRS Duration:  104 QT Interval:  423 QTC Calculation: 495 R Axis:   31  Text Interpretation: Sinus rhythm RSR' in V1 or V2, right VCD or RVH Borderline T abnormalities, anterior leads Borderline prolonged QT interval no sig change from previous Confirmed by Armenta Canning [phone]) on 09/05/2024 2:00:04 PM  Radiology: CT Head Wo Contrast Result Date: 09/05/2024 EXAM: CT HEAD WITHOUT CONTRAST 09/05/2024 12:50:03 PM TECHNIQUE: CT of the head was performed without the administration of intravenous contrast. Automated exposure control, iterative reconstruction, and/or weight based adjustment of the mA/kV was utilized to reduce the radiation dose to as low as reasonably achievable. COMPARISON: Head CT 01/05/2023 CLINICAL HISTORY: Seizure, new-onset, no history of trauma. FINDINGS: BRAIN AND VENTRICLES: There is no evidence of an acute infarct, intracranial hemorrhage, mass, midline shift, hydrocephalus, or extra-axial fluid  collection. Cerebral volume is normal. ORBITS: Right ocular lens replacement. SINUSES: No acute abnormality. SOFT TISSUES AND SKULL: No acute soft tissue abnormality. No skull fracture. IMPRESSION: 1. Negative head CT. Electronically signed by: Dasie Hamburg MD 09/05/2024 12:56 PM EST RP Workstation: [id number]     Procedures   Medications Ordered in the ED  topiramate  (TOPAMAX ) tablet 50 mg (has no administration in time range)  LORazepam  (ATIVAN ) injection 2 mg (0 mg Intramuscular Hold 09/05/24 1235)  sodium chloride  0.9 % bolus 1,000 mL (1,000 mLs Intravenous New Bag/Given 09/05/24 1633)                                    Medical Decision Making Amount and/or Complexity of Data Reviewed Labs: ordered. Radiology: ordered.  Risk Prescription drug management.   This patient presents to the ED for concern of seizure, this involves an extensive number of treatment options, and is a complaint that carries with it a high risk of complications and morbidity.  The differential diagnosis includes medication side effect, alcohol  withdrawal, seizures, dehydration electrolyte abnormality   Co morbidities that complicate the patient evaluation  WPW, DM, asthma, HLD    Lab Tests:  I personally interpreted labs.  The pertinent results include:   CBC, CMP magnesium  EtOH unremarkable.  Urine drug urine positive for opiates but patient is prescribed chronically, UA shows few bacteria moderate leukocytes but does have 6-10 squamous cells and thought to be contaminant.   Imaging Studies ordered:  I ordered imaging studies including head CT I independently visualized and interpreted imaging which showed no  acute findings I agree with the radiologist interpretation   Cardiac Monitoring: / EKG:  The patient was maintained on a cardiac monitor.    Consultations Obtained:  I requested consultation with the neurology,  and discussed lab and imaging findings as well as pertinent plan - they  recommend: Outpatient follow-up with neurology okay to start topomax 50 mg   Problem List / ED Course / Critical interventions / Medication management  Reports emergency room with complaint of seizure.  She has no history of seizure disorder.  This was described as a involuntary jerking motion by family for approximately 5 to 10 minutes.  When I was in the room I did observe patient have an episode where her eyes deviated to the left and she became very rigid for about 2 minutes.  She is maintaining airway and hemodynamically stable.  Head appears atraumatic.  I did obtain seizure workup here and her lab work is overall reassuring and she has no findings on head CT.  I did discuss with neurology who recommended starting her on Topamax  50 mg twice daily and having her follow-up outpatient with neurology. I ordered medication including Topamax  50mg  Reevaluation of the patient after these medicines showed that the patient improved I have reviewed the patients home medicines and have made adjustments as needed. Patient was given seizure precautions including not driving or operating heavy machinery and avoiding activities that would put her other people at risk if she were to have another seizure.  Stable for discharge with outpatient follow-up.        Final diagnoses:  Seizure-like activity [facility])    ED Discharge Orders          Ordered    Ambulatory referral to Neurology       Comments: An appointment is requested in approximately: 2 weeks   09/05/24 1948    topiramate  (TOPAMAX ) 50 MG tablet  2 times daily        09/05/24 1948               Tyquez Hollibaugh, Warren SAILOR, PA-C 09/05/24 KENITH Armenta Canning, MD 09/09/24 [phone]

## 2024-09-05 NOTE — ED Notes (Addendum)
 Patient voided 350 ml after receiving 1000 ml bag of fluid

## 2024-09-07 ENCOUNTER — Other Ambulatory Visit: Payer: Self-pay

## 2024-09-11 ENCOUNTER — Other Ambulatory Visit: Payer: Self-pay

## 2024-09-11 DIAGNOSIS — E1139 Type 2 diabetes mellitus with other diabetic ophthalmic complication: Secondary | ICD-10-CM | POA: Diagnosis not present

## 2024-09-11 DIAGNOSIS — E559 Vitamin D deficiency, unspecified: Secondary | ICD-10-CM | POA: Diagnosis not present

## 2024-09-11 DIAGNOSIS — Z79899 Other long term (current) drug therapy: Secondary | ICD-10-CM | POA: Diagnosis not present

## 2024-09-11 DIAGNOSIS — E781 Pure hyperglyceridemia: Secondary | ICD-10-CM | POA: Diagnosis not present

## 2024-09-11 DIAGNOSIS — F418 Other specified anxiety disorders: Secondary | ICD-10-CM | POA: Diagnosis not present

## 2024-09-11 DIAGNOSIS — Z09 Encounter for follow-up examination after completed treatment for conditions other than malignant neoplasm: Secondary | ICD-10-CM | POA: Diagnosis not present

## 2024-09-11 DIAGNOSIS — M797 Fibromyalgia: Secondary | ICD-10-CM | POA: Diagnosis not present

## 2024-09-11 DIAGNOSIS — G629 Polyneuropathy, unspecified: Secondary | ICD-10-CM | POA: Diagnosis not present

## 2024-09-11 DIAGNOSIS — R252 Cramp and spasm: Secondary | ICD-10-CM | POA: Diagnosis not present

## 2024-09-11 DIAGNOSIS — E7800 Pure hypercholesterolemia, unspecified: Secondary | ICD-10-CM | POA: Diagnosis not present

## 2024-09-11 DIAGNOSIS — K219 Gastro-esophageal reflux disease without esophagitis: Secondary | ICD-10-CM | POA: Diagnosis not present

## 2024-09-14 DIAGNOSIS — Z79899 Other long term (current) drug therapy: Secondary | ICD-10-CM | POA: Diagnosis not present

## 2024-09-18 LAB — GENECONNECT MOLECULAR SCREEN: Genetic Analysis Overall Interpretation: NEGATIVE

## 2024-09-25 ENCOUNTER — Ambulatory Visit: Admitting: Neurology

## 2024-09-25 VITALS — BP 121/88 | HR 91 | Wt 185.4 lb

## 2024-09-25 DIAGNOSIS — R569 Unspecified convulsions: Secondary | ICD-10-CM

## 2024-09-25 NOTE — Patient Instructions (Signed)
Electroencephalogram (EEG) Instructions  To prepare for your EEG: Wash your hair the night before or the day of the test, but don't use any conditioners, hair creams, sprays or styling gels.  Avoid anything with caffeine 6 hours before the test. Take your usual medications unless instructed otherwise. If you're supposed to sleep during your EEG test, your doctor may ask you to sleep less or even avoid sleep entirely the night before your EEG. If you have trouble falling asleep for the test, you might be given a sedative to help you relax.   What to expect You'll feel little or no discomfort during an EEG. The electrodes don't transmit any sensations. They just record your brain waves. If you need to sleep during the EEG, you might be given a sedative beforehand to help you relax. During the test: A technician measures your head and marks your scalp with a type of pencil, to indicate where to attach the electrodes. Those spots on your scalp may be scrubbed with a gritty cream to improve the quality of the recording.  A technician attaches flat metal discs (electrodes) to your scalp using a special adhesive. The electrodes are connected with wires to an instrument that amplifies - makes bigger - the brain waves and records them on computer equipment. Some people wear and elastic cap fitted with electrodes, instead of having the adhesive applied to their scalps. Once the electrodes are in place, an EEG typically takes 30-60 minutes.  You relax in a comfortable position with your eyes closed during the test. At various times, the technician may ask you to open and close your eyes, perform a few simple calculations, read a paragraph, look at a picture, breathe deeply (hyperventilate) for a few minutes, or look at a flashing light.  After the test After the test, the technician removes the electrodes or cap. If no sedative was given, you should feel no side effects after the procedure, and you can return to  your normal routine.  If you used a sedative, it may take about an hour to partially recover from the medication. You'll need someone to take you home because it can take up to a day for the full effects of the sedative to wear off. Rest and don't drive for the remainder of the day.   

## 2024-09-25 NOTE — Progress Notes (Signed)
 Follow-up Visit   Date: 09/25/2024    Michelle Gibson MRN: [id number] DOB: 01/26/1976    Michelle Gibson is a 48 y.o. right-handed female with insulin -dependent diabetes mellitus (HbA1c 8), congenital left eye blindness, hyperlipidemia, migraines, and generalized pain returning to the clinic for follow-up of seizure-like spells.  The patient was accompanied to the clinic by daughter who also provides collateral information.    IMPRESSION/PLAN: Assessment & Plan Seizure-like spell.  Duration of event is much longer than expected for typical seizure.  She has normal neurological exam and CT head.  She is unable to get MRI due to medtronic device in her hip.  Further evaluation as follows: - Routine EEG. If normal, will check ambulatory EEG - Continue topiramate  50mg  BID - Advised no driving for six months post-seizure.  Further recommendations pending results.  ---------------------------------------------  UPDATE 09/25/2024: Discussed the use of AI scribe software for clinical note transcription with the patient, who gave verbal consent to proceed.  History of Present Illness Michelle Gibson is a 48 year old female with diabetes who presents with a recent seizure-like episode.  She experienced a seizure-like episode the day before Thanksgiving while cooking. In the days leading up to the seizure, she felt 'very off' and weak in her arms. During the episode, she repeatedly said 'oh, no' before her eyes rolled back and she experienced violent shaking of her entire body for approximately 30 minutes. EMS was called, and she was taken to the hospital where she had another seizure. She does not recall the events leading up to or during the seizures, only waking up in the hospital.  Prior to the seizure, she experienced involuntary jerking movements and a sense of an impending event, described as an aura, occurring several days prior.  She has not started any new medications recently  but mentioned playing online games with flashing lights, which she speculated might have contributed to the event.  She is currently taking gabapentin  600 mg three times a day for burning sensations in her legs and hands, associated with her diabetes. She reports that she was started on topiramate  50mg  BID at the hospital. She reported tongue biting on the right side during the seizure but no incontinence. She has no prior history of seizures.  Her social history includes being a non-smoker, non-drinker, and no drug use. 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 She mentioned that her blood sugar levels were stable on the day of the seizure.  Medications:  Medications Ordered Prior to Encounter[1]  Allergies: Allergies[2]  Vital Signs:  BP 121/88   Pulse 91   Wt 185 lb 6.4 oz (84.1 kg)   SpO2 97%   BMI 31.82 kg/m    Neurological Exam: MENTAL STATUS including orientation to time, place, person, recent and remote memory, attention span and concentration, language, and fund of knowledge is normal.  Speech is not dysarthric.  CRANIAL NERVES:   Pupils equal round and reactive to light.  Normal conjugate, extra-ocular eye movements in all directions of gaze.  No ptosis.  Face is symmetric. Palate elevates symmetrically.  Tongue is midline.  MOTOR:  Motor strength is 5/5 in all extremities.  No atrophy, fasciculations or abnormal movements.  No pronator drift.  Tone is normal.    MSRs:  Reflexes are 2+/4 throughout.  SENSORY:  Intact to vibration throughout.  COORDINATION/GAIT:  Normal finger-to- nose-finger.  Intact rapid alternating movements bilaterally.  Gait narrow based and stable.   Data: CT head  09/05/2024:  negative    Thank you for allowing me to participate in patient's care.  If I can answer any additional questions, I would be pleased to do so.    Sincerely,    Sascha Palma K. Tobie, DO      [1]  Current Outpatient Medications on File Prior to Visit  Medication Sig  Dispense Refill   Accu-Chek Softclix Lancets lancets Use 4 (four) times daily -  before meals and at bedtime. 100 each 4   atorvastatin  (LIPITOR) 40 MG tablet Take 40 mg by mouth at bedtime.     Blood Glucose Monitoring Suppl (ACCU-CHEK GUIDE ME) w/Device KIT Use as directed 4 (four) times daily -  before meals and at bedtime. 1 kit 0   Blood Glucose Monitoring Suppl (ONETOUCH VERIO) w/Device KIT Use to check blood sugar TID. 1 kit 0   buprenorphine (BUTRANS) 10 MCG/HR PTWK APPLY 1 PATCH TO THE SKIN ONCE A WEEK     buprenorphine (BUTRANS) 15 MCG/HR 1 patch once a week.     Continuous Blood Gluc Transmit (DEXCOM G6 TRANSMITTER) MISC 1 each by Does not apply route 4 (four) times daily -  before meals and at bedtime. 1 each 1   Continuous Glucose Sensor (DEXCOM G7 SENSOR) MISC USE AS DIRECTED 9 each 1   Continuous Glucose Sensor (DEXCOM G7 SENSOR) MISC 1 Device by Does not apply route continuous. 9 each 1   fluticasone  (FLONASE ) 50 MCG/ACT nasal spray Place 1 spray into both nostrils daily. 16 g 0   gabapentin  (NEURONTIN ) 100 MG capsule      gabapentin  (NEURONTIN ) 300 MG capsule Take 2 capsules (600 mg total) by mouth 4 (four) times daily. 240 capsule 2   gabapentin  (NEURONTIN ) 600 MG tablet Take 600 mg by mouth 3 (three) times daily.     Glucagon  (BAQSIMI  ONE PACK) 3 MG/DOSE POWD Place 1 Device into the nose as needed (Low blood sugar with impaired consciousness). 2 each 3   glucose blood (TRUE METRIX BLOOD GLUCOSE TEST) test strip Use 4 (four) times daily -  before meals and at bedtime. Use as instructed 100 each 12   HYDROcodone -acetaminophen  (NORCO) 10-325 MG tablet Take 1 tablet by mouth 4 (four) times daily as needed.     ibuprofen  (ADVIL ) 800 MG tablet TAKE 1 TABLET (800 MG TOTAL) BY MOUTH 3 (THREE) TIMES DAILY. X 7 DAYS THEN AS NEEDED FOR PAIN EAT PRIOR TO TAKING THIS MEDICATION     insulin  glargine (LANTUS ) 100 UNIT/ML Solostar Pen Inject 34 units into the skin once daily. After 1 week, if  fasting blood sugar > 200, increase to 40 units in the morning. 15 mL 1   insulin  lispro (HUMALOG ) 100 UNIT/ML KwikPen Inject 10 Units into the skin 3 (three) times daily. If blood sugar before you eat is under 150, do not take. 15 mL 3   Insulin  Pen Needle 32G X 4 MM MISC Use a total of 4 pen needles a day. 100 each 3   metFORMIN  (GLUCOPHAGE ) 1000 MG tablet Take 1 tablet (1,000 mg total) by mouth 2 (two) times daily with a meal. 180 tablet 0   metFORMIN  (GLUCOPHAGE ) 500 MG tablet      naloxone (NARCAN) nasal spray 4 mg/0.1 mL SMARTSIG:Both Nares     omeprazole (PRILOSEC) 40 MG capsule Take 40 mg by mouth daily.     ondansetron  (ZOFRAN -ODT) 4 MG disintegrating tablet Place 1 tab under the tongue every 4 hours as needed for nausea and vomitting  12 tablet 0   OneTouch Delica Lancets 33G MISC Use to check blood sugar TID. 100 each 2   propafenone (RYTHMOL SR) 225 MG 12 hr capsule      propafenone (RYTHMOL) 150 MG tablet Take 150 mg by mouth 3 (three) times daily.     tamsulosin  (FLOMAX ) 0.4 MG CAPS capsule Take 1 capsule (0.4 mg total) by mouth daily. 10 capsule 0   tirzepatide  (MOUNJARO ) 5 MG/0.5ML Pen Inject 5 mg into the skin once a week. 2 mL 0   tiZANidine (ZANAFLEX) 2 MG tablet Take 2 mg by mouth 3 (three) times daily as needed.     tiZANidine (ZANAFLEX) 4 MG tablet Take 4 mg by mouth 3 (three) times daily.     topiramate  (TOPAMAX ) 50 MG tablet Take 1 tablet (50 mg total) by mouth 2 (two) times daily. 60 tablet 1   atorvastatin  (LIPITOR) 20 MG tablet Take 1 tablet (20 mg total) by mouth daily. 90 tablet 3   benzonatate  (TESSALON ) 100 MG capsule Take 1 capsule by mouth every 8 (eight) hours. (Patient not taking: Reported on 02/24/2024)     [DISCONTINUED] metoCLOPramide  (REGLAN ) 10 MG tablet Take 1 tablet (10 mg total) by mouth every 8 (eight) hours as needed for nausea or vomiting (headache). (Patient not taking: Reported on 07/14/2020) 5 tablet 0   [DISCONTINUED] traZODone  (DESYREL ) 50 MG tablet  Take 0.5-1 tablets (25-50 mg total) by mouth at bedtime as needed for sleep. 30 tablet 3   No current facility-administered medications on file prior to visit.  [2]  Allergies Allergen Reactions   Penicillamine Dermatitis   Ciprofloxacin Hives and Dermatitis   Lyrica  Cr [Pregabalin  Er] Other (See Comments)    Suicidal ideation   Doxycycline  Rash    Yeast infection   Flecainide Hives, Nausea And Vomiting, Rash, Dermatitis and Nausea Only   Latex Other (See Comments), Rash, Dermatitis and Itching    Causes blisters, also   Penicillin G Rash    Has patient had a PCN reaction causing immediate rash, facial/tongue/throat swelling, SOB or lightheadedness with hypotension: yes Has patient had a PCN reaction causing severe rash involving mucus membranes or skin necrosis: yes Has patient had a PCN reaction that required hospitalization: no Has patient had a PCN reaction occurring within the last 10 years: no If all of the above answers are NO, then may proceed with Cephalosporin use.    Penicillins Dermatitis    Has patient had a PCN reaction causing immediate rash, facial/tongue/throat swelling, SOB or lightheadedness with hypotension: yes Has patient had a PCN reaction causing severe rash involving mucus membranes or skin necrosis: yes Has patient had a PCN reaction that required hospitalization: no Has patient had a PCN reaction occurring within the last 10 years: no If all of the above answers are NO, then may proceed with Cephalosporin use.

## 2024-09-26 ENCOUNTER — Ambulatory Visit: Payer: Self-pay

## 2024-09-26 DIAGNOSIS — R569 Unspecified convulsions: Secondary | ICD-10-CM

## 2024-09-26 NOTE — Progress Notes (Unsigned)
 EEG complete and ready for review.

## 2024-09-27 ENCOUNTER — Ambulatory Visit: Payer: Self-pay | Admitting: Neurology

## 2024-09-27 DIAGNOSIS — R569 Unspecified convulsions: Secondary | ICD-10-CM

## 2024-09-27 NOTE — Procedures (Signed)
 ELECTROENCEPHALOGRAM REPORT  Date of Study: 09/26/2024  Patient's Name: Michelle Gibson MRN: [id number] Date of Birth: Aug 21, 1976  Referring Provider: Dr. Tonita Blanch  Clinical History: This is a 48 year old woman with episode of involuntary jerking followed by generalized shaking. EEG for classification.  CNS Active Medications: Gabapentin , Topiramate   Technical Summary: A multichannel digital 1-hour EEG recording measured by the international 10-20 system with electrodes applied with paste and impedances below 5000 ohms performed in our laboratory with EKG monitoring in an awake and asleep patient.  Hyperventilation was not performed. Photic stimulation was performed.  The digital EEG was referentially recorded, reformatted, and digitally filtered in a variety of bipolar and referential montages for optimal display.    Description: The patient is awake and asleep during the recording.  During maximal wakefulness, there is a symmetric, medium voltage 10 Hz posterior dominant rhythm that attenuates with eye opening.  The record is symmetric.  During drowsiness and sleep, there is an increase in theta slowing of the background.  Vertex waves and symmetric sleep spindles were seen. Photic stimulation did not elicit any abnormalities.  There were no epileptiform discharges or electrographic seizures seen.    EKG lead was unremarkable.  Impression: This 1-hour awake and asleep EEG is normal.    Clinical Correlation: A normal EEG does not exclude a clinical diagnosis of epilepsy.  If further clinical questions remain, prolonged EEG may be helpful.  Clinical correlation is advised.   Darice Shivers, M.D.

## 2024-09-28 ENCOUNTER — Telehealth: Payer: Self-pay | Admitting: Neurology

## 2024-09-28 ENCOUNTER — Emergency Department (HOSPITAL_COMMUNITY)
Admission: EM | Admit: 2024-09-28 | Discharge: 2024-09-28 | Disposition: A | Discharge status: Home / Self Care | Attending: Emergency Medicine | Admitting: Emergency Medicine

## 2024-09-28 ENCOUNTER — Other Ambulatory Visit: Payer: Self-pay

## 2024-09-28 DIAGNOSIS — R258 Other abnormal involuntary movements: Secondary | ICD-10-CM | POA: Insufficient documentation

## 2024-09-28 DIAGNOSIS — R569 Unspecified convulsions: Secondary | ICD-10-CM | POA: Diagnosis not present

## 2024-09-28 DIAGNOSIS — Z794 Long term (current) use of insulin: Secondary | ICD-10-CM | POA: Insufficient documentation

## 2024-09-28 DIAGNOSIS — Z8616 Personal history of COVID-19: Secondary | ICD-10-CM | POA: Diagnosis not present

## 2024-09-28 DIAGNOSIS — J45909 Unspecified asthma, uncomplicated: Secondary | ICD-10-CM | POA: Insufficient documentation

## 2024-09-28 DIAGNOSIS — E1165 Type 2 diabetes mellitus with hyperglycemia: Secondary | ICD-10-CM | POA: Diagnosis not present

## 2024-09-28 DIAGNOSIS — R739 Hyperglycemia, unspecified: Secondary | ICD-10-CM

## 2024-09-28 DIAGNOSIS — R8289 Other abnormal findings on cytological and histological examination of urine: Secondary | ICD-10-CM | POA: Insufficient documentation

## 2024-09-28 DIAGNOSIS — Z7984 Long term (current) use of oral hypoglycemic drugs: Secondary | ICD-10-CM | POA: Diagnosis not present

## 2024-09-28 DIAGNOSIS — Z91040 Latex allergy status: Secondary | ICD-10-CM | POA: Insufficient documentation

## 2024-09-28 LAB — CBC WITH DIFFERENTIAL/PLATELET
Abs Immature Granulocytes: 0.02 K/uL (ref 0.00–0.07)
Basophils Absolute: 0 K/uL (ref 0.0–0.1)
Basophils Relative: 1 %
Eosinophils Absolute: 0.2 K/uL (ref 0.0–0.5)
Eosinophils Relative: 2 %
HCT: 40 % (ref 36.0–46.0)
Hemoglobin: 13.6 g/dL (ref 12.0–15.0)
Immature Granulocytes: 0 %
Lymphocytes Relative: 39 %
Lymphs Abs: 2.6 K/uL (ref 0.7–4.0)
MCH: 30.5 pg (ref 26.0–34.0)
MCHC: 34 g/dL (ref 30.0–36.0)
MCV: 89.7 fL (ref 80.0–100.0)
Monocytes Absolute: 0.4 K/uL (ref 0.1–1.0)
Monocytes Relative: 5 %
Neutro Abs: 3.5 K/uL (ref 1.7–7.7)
Neutrophils Relative %: 53 %
Platelets: 306 K/uL (ref 150–400)
RBC: 4.46 MIL/uL (ref 3.87–5.11)
RDW: 12.7 % (ref 11.5–15.5)
WBC: 6.7 K/uL (ref 4.0–10.5)
nRBC: 0 % (ref 0.0–0.2)

## 2024-09-28 LAB — URINALYSIS, ROUTINE W REFLEX MICROSCOPIC
Bacteria, UA: NONE SEEN
Bilirubin Urine: NEGATIVE
Glucose, UA: >=500 mg/dL — AB
Hgb urine dipstick: NEGATIVE
Ketones, ur: NEGATIVE mg/dL
Nitrite: NEGATIVE
Protein, ur: NEGATIVE mg/dL
Specific Gravity, Urine: 1.012 (ref 1.005–1.030)
pH: 7 (ref 5.0–8.0)

## 2024-09-28 LAB — BASIC METABOLIC PANEL WITH GFR
Anion gap: 12 (ref 5–15)
BUN: 18 mg/dL (ref 6–20)
CO2: 25 mmol/L (ref 22–32)
Calcium: 9.5 mg/dL (ref 8.9–10.3)
Chloride: 99 mmol/L (ref 98–111)
Creatinine, Ser: 0.8 mg/dL (ref 0.44–1.00)
GFR, Estimated: >60 mL/min
Glucose, Bld: 331 mg/dL — ABNORMAL HIGH (ref 70–99)
Potassium: 3.9 mmol/L (ref 3.5–5.1)
Sodium: 136 mmol/L (ref 135–145)

## 2024-09-28 LAB — BLOOD GAS, VENOUS
Acid-Base Excess: 1.3 mmol/L (ref 0.0–2.0)
Bicarbonate: 27.2 mmol/L (ref 20.0–28.0)
Drawn by: 75195
O2 Saturation: 76.8 %
Patient temperature: 37
pCO2, Ven: 47 mmHg (ref 44–60)
pH, Ven: 7.37 (ref 7.25–7.43)
pO2, Ven: 44 mmHg (ref 32–45)

## 2024-09-28 MED ORDER — INSULIN GLARGINE 100 UNIT/ML ~~LOC~~ SOLN
34.0000 [IU] | Freq: Once | SUBCUTANEOUS | Status: AC
  Administered 2024-09-28: 34 [IU] via SUBCUTANEOUS
  Filled 2024-09-28: qty 0.34

## 2024-09-28 NOTE — Telephone Encounter (Signed)
 Patient is calling to see if we have the results of the EEG testing that was done  please call

## 2024-09-28 NOTE — ED Notes (Signed)
 Left arm pain patch

## 2024-09-28 NOTE — Discharge Instructions (Addendum)
 While you were in the emergency room, you have blood work done that showed that your blood sugar was elevated, but you do not have any of the concerning findings with high blood sugar.  Make sure that you are taking your medications as prescribed.  We reviewed your recent EEG, which did not show any seizure activity.  Continue to take your Topamax .  Please call your neurologist today to let them know that you believe you may have had a repeat episode.  They may make some medication changes for you.  Return to the emergency department as needed for your medical care.

## 2024-09-28 NOTE — ED Provider Notes (Addendum)
 " [place] EMERGENCY DEPARTMENT AT [facility] Provider Note  CSN: [id number] Arrival date & time: 09/28/24 0346  Chief Complaint(s) Seizures and Hyperglycemia  HPI Michelle Gibson is a 48 y.o. female who is here today with seizure-like activity.  Patient reports that she was not feeling well over the last couple of days, she states that she has been having an aura preceding some shaking-like activity.  She denies losing consciousness, was told by neighbors that she had some shaking activity.  Patient recently had an EEG which was read as negative.  She has a history of diabetes, has been checking her blood sugar says that it had been elevated last couple of days.   Past Medical History Past Medical History:  Diagnosis Date   Acute tear lateral meniscus, right, initial encounter 08/29/2019   Asthma    Closed fracture of upper end of fibula 09/21/2018   Diabetes mellitus without complication (HCC)    TYPE II   Hyperglycemia 09/15/2016   Migraines    WPW (Wolff-Parkinson-White syndrome)    s/p ablation at age 36   Patient Active Problem List   Diagnosis Date Noted   Fibromyalgia 04/04/2023   Migraine 08/18/2021   Diabetes mellitus with hyperglycemia (HCC) 06/08/2021   Uncontrolled type 2 diabetes mellitus with hyperglycemia (HCC) 06/08/2021   Nausea 06/08/2021   Hyperlipidemia 06/08/2021   History of COVID-19 03/19/2021   Tachycardia 03/19/2021   Palpitations 03/19/2021   Cough 03/19/2021   Complete tear of right ACL, initial encounter 12/10/2020   Acute tear lateral meniscus, right, initial encounter 08/29/2019   Acute medial meniscus tear of right knee 08/22/2019   Fever and chills 11/17/2018   Closed fracture of upper end of fibula 09/21/2018   WPW (Wolff-Parkinson-White syndrome) 10/10/2017   Asthma with acute exacerbation 09/15/2016   Hyperglycemia 09/15/2016   History of Wolff-Parkinson-White (WPW) syndrome 09/15/2016   Acute respiratory failure with  hypoxia (HCC) 09/15/2016   Bronchospasm 09/11/2016   Home Medication(s) Prior to Admission medications  Medication Sig Start Date End Date Taking? Authorizing Provider  Accu-Chek Softclix Lancets lancets Use 4 (four) times daily -  before meals and at bedtime. 04/05/23   Jaycee Greig PARAS, NP  atorvastatin  (LIPITOR) 20 MG tablet Take 1 tablet (20 mg total) by mouth daily. 05/25/21   Fleming, Zelda W, NP  atorvastatin  (LIPITOR) 40 MG tablet Take 40 mg by mouth at bedtime. 12/22/23   [provider]  benzonatate  (TESSALON ) 100 MG capsule Take 1 capsule by mouth every 8 (eight) hours. Patient not taking: Reported on 02/24/2024 09/15/22   [provider]  Blood Glucose Monitoring Suppl (ACCU-CHEK GUIDE ME) w/Device KIT Use as directed 4 (four) times daily -  before meals and at bedtime. 04/05/23   Jaycee Greig PARAS, NP  Blood Glucose Monitoring Suppl (ONETOUCH VERIO) w/Device KIT Use to check blood sugar TID. 12/10/20   Newlin, Enobong, MD  buprenorphine (BUTRANS) 10 MCG/HR PTWK APPLY 1 PATCH TO THE SKIN ONCE A WEEK 11/29/23   [provider]  buprenorphine (BUTRANS) 15 MCG/HR 1 patch once a week. 01/26/24   [provider]  Continuous Blood Gluc Transmit (DEXCOM G6 TRANSMITTER) MISC 1 each by Does not apply route 4 (four) times daily -  before meals and at bedtime. 06/19/21   Jaycee Greig PARAS, NP  Continuous Glucose Sensor (DEXCOM G7 SENSOR) MISC USE AS DIRECTED 01/30/24   Obadiah Birmingham, MD  Continuous Glucose Sensor (DEXCOM G7 SENSOR) MISC 1 Device by Does not  apply route continuous. 01/10/24   Komal, Motwani, MD  fluticasone  (FLONASE ) 50 MCG/ACT nasal spray Place 1 spray into both nostrils daily. 08/15/22   Hazen Darryle BRAVO, FNP  gabapentin  (NEURONTIN ) 100 MG capsule     [provider]  gabapentin  (NEURONTIN ) 300 MG capsule Take 2 capsules (600 mg total) by mouth 4 (four) times daily. 07/08/23   Jaycee Greig PARAS, NP  gabapentin  (NEURONTIN ) 600 MG tablet Take 600 mg by mouth 3  (three) times daily. 01/20/24   [provider]  Glucagon  (BAQSIMI  ONE PACK) 3 MG/DOSE POWD Place 1 Device into the nose as needed (Low blood sugar with impaired consciousness). 12/29/23   Komal, Motwani, MD  glucose blood (TRUE METRIX BLOOD GLUCOSE TEST) test strip Use 4 (four) times daily -  before meals and at bedtime. Use as instructed 04/05/23   Jaycee Greig PARAS, NP  HYDROcodone -acetaminophen  (NORCO) 10-325 MG tablet Take 1 tablet by mouth 4 (four) times daily as needed. 02/20/24   [provider]  ibuprofen  (ADVIL ) 800 MG tablet TAKE 1 TABLET (800 MG TOTAL) BY MOUTH 3 (THREE) TIMES DAILY. X 7 DAYS THEN AS NEEDED FOR PAIN EAT PRIOR TO TAKING THIS MEDICATION    [provider]  insulin  glargine (LANTUS ) 100 UNIT/ML Solostar Pen Inject 34 units into the skin once daily. After 1 week, if fasting blood sugar > 200, increase to 40 units in the morning. 02/15/23   Newlin, Enobong, MD  insulin  lispro (HUMALOG ) 100 UNIT/ML KwikPen Inject 10 Units into the skin 3 (three) times daily. If blood sugar before you eat is under 150, do not take. 02/15/23   Newlin, Enobong, MD  Insulin  Pen Needle 32G X 4 MM MISC Use a total of 4 pen needles a day. 02/15/23   Newlin, Enobong, MD  metFORMIN  (GLUCOPHAGE ) 1000 MG tablet Take 1 tablet (1,000 mg total) by mouth 2 (two) times daily with a meal. 04/21/23 09/25/24  Jaycee Greig PARAS, NP  metFORMIN  (GLUCOPHAGE ) 500 MG tablet     [provider]  naloxone (NARCAN) nasal spray 4 mg/0.1 mL SMARTSIG:Both Nares    [provider]  omeprazole (PRILOSEC) 40 MG capsule Take 40 mg by mouth daily. 02/20/24   [provider]  ondansetron  (ZOFRAN -ODT) 4 MG disintegrating tablet Place 1 tab under the tongue every 4 hours as needed for nausea and vomitting 10/22/21   Suzette Pac, MD  OneTouch Delica Lancets 33G MISC Use to check blood sugar TID. 12/10/20   Newlin, Enobong, MD  propafenone (RYTHMOL SR) 225 MG 12 hr capsule  12/30/23   [provider]  propafenone (RYTHMOL) 150 MG tablet Take 150 mg by mouth 3 (three) times daily. 09/12/23   [provider]  tamsulosin  (FLOMAX ) 0.4 MG CAPS capsule Take 1 capsule (0.4 mg total) by mouth daily. 08/21/22   Zammit, Mikell Camp, MD  tirzepatide  (MOUNJARO ) 5 MG/0.5ML Pen Inject 5 mg into the skin once a week. 12/29/23   Komal, Motwani, MD  tiZANidine (ZANAFLEX) 2 MG tablet Take 2 mg by mouth 3 (three) times daily as needed. 11/29/23   [provider]  tiZANidine (ZANAFLEX) 4 MG tablet Take 4 mg by mouth 3 (three) times daily.    [provider]  topiramate  (TOPAMAX ) 50 MG tablet Take 1 tablet (50 mg total) by mouth 2 (two) times daily. 09/05/24   Barrett, Warren SAILOR, PA-C  metoCLOPramide  (REGLAN ) 10 MG tablet Take 1 tablet (10 mg total) by mouth every 8 (eight) hours as needed for  nausea or vomiting (headache). Patient not taking: Reported on 07/14/2020 07/07/20 07/17/20  Petrucelli, Samantha R, PA-C  traZODone  (DESYREL ) 50 MG tablet Take 0.5-1 tablets (25-50 mg total) by mouth at bedtime as needed for sleep. 06/04/19 07/13/19  Alec House, MD                                                                                                                                    Past Surgical History Past Surgical History:  Procedure Laterality Date   ABDOMINAL HYSTERECTOMY     CARDIAC SURGERY     CESAREAN SECTION     KNEE ARTHROSCOPY WITH LATERAL MENISECTOMY Right 08/29/2019   Procedure: PARTIAL LATERAL MENISECTOMY;  Surgeon: Jerri Kay HERO, MD;  Location: [facility];  Service: Orthopedics;  Laterality: Right;   KNEE ARTHROSCOPY WITH MEDIAL MENISECTOMY Right 08/29/2019   Procedure: RIGHT KNEE ARTHROSCOPY WITH PARTIAL MEDIAL MENISECTOMY;  Surgeon: Jerri Kay HERO, MD;  Location: [facility];  Service: Orthopedics;  Laterality: Right;   Family History Family History  Problem Relation Age of Onset   Hypertension Mother    CAD Mother 67   Diabetes  Mother    Hypertension Father    CAD Father 47       CABG    Social History Social History[1] Allergies Penicillamine, Ciprofloxacin, Lyrica  cr [pregabalin  er], Doxycycline , Flecainide, Latex, Penicillin g, and Penicillins  Review of Systems Review of Systems  Physical Exam Vital Signs  I have reviewed the triage vital signs BP (!) 146/88 (BP Location: Left Arm)   Pulse 85   Temp 98.6 F (37 C) (Oral)   Resp 14   SpO2 99%   Physical Exam Vitals and nursing note reviewed.  HENT:     Head: Normocephalic.     Mouth/Throat:     Mouth: Mucous membranes are moist.  Cardiovascular:     Rate and Rhythm: Normal rate.  Pulmonary:     Effort: Pulmonary effort is normal.  Abdominal:     General: Abdomen is flat.     Palpations: Abdomen is soft.  Musculoskeletal:        General: Normal range of motion.  Neurological:     General: No focal deficit present.     Mental Status: She is alert and oriented to person, place, and time.     Cranial Nerves: No cranial nerve deficit.     Motor: No weakness.  Psychiatric:        Mood and Affect: Mood normal.     ED Results and Treatments Labs (all labs ordered are listed, but only abnormal results are displayed) Labs Reviewed  BASIC METABOLIC PANEL WITH GFR - Abnormal; Notable for the following components:      Result Value   Glucose, Bld 331 (*)    All other components within normal limits  URINALYSIS, ROUTINE W REFLEX MICROSCOPIC - Abnormal; Notable for the following components:  Color, Urine STRAW (*)    Glucose, UA >=500 (*)    Leukocytes,Ua MODERATE (*)    All other components within normal limits  CBC WITH DIFFERENTIAL/PLATELET  BLOOD GAS, VENOUS                                                                                                                          Radiology No results found.  Pertinent labs & imaging results that were available during my care of the patient were reviewed by me and considered in my  medical decision making (see MDM for details).  Medications Ordered in ED Medications - No data to display                                                                                                                                   Procedures Procedures  (including critical care time)  Medical Decision Making / ED Course   This patient presents to the ED for concern of possible seizure, this involves an extensive number of treatment options, and is a complaint that carries with it a high risk of complications and morbidity.  The differential diagnosis includes seizure-like activity, seizure disorder, nonepileptiform seizure-like activity, hyperglycemia.  MDM: Patient with EEG yesterday that was negative.  She may have had a seizure, unclear.  Does not sound as though there is a postictal period.  I do not see evidence of tongue biting, no bowel or bladder incontinence.  May be nonepileptiform seizure-like activity.  Will check blood work on the patient.  Likely outpatient follow-up.  Reassessment 5:45 AM-patient with hyperglycemia, however no signs of acidosis.  She has not exhibited any seizure episodes here in the ED.  Let the patient follow-up with her neurologist.  Counseled the patient on tighter glycemic control.  Will follow urine culture has the patient did have moderate leukocytes. She does not have any urinary symptoms.  She states she did not take her insulin  today because she was busy.  Will provide her with her home Lantus .   Additional history obtained: -Additional history obtained from EMS -External records from outside source obtained and reviewed including: Chart review including previous notes, labs, imaging, consultation notes   Lab Tests: -I ordered, reviewed, and interpreted labs.   The pertinent results include:   Labs Reviewed  BASIC METABOLIC PANEL WITH GFR - Abnormal; Notable for the following components:  Result Value   Glucose, Bld 331 (*)    All  other components within normal limits  URINALYSIS, ROUTINE W REFLEX MICROSCOPIC - Abnormal; Notable for the following components:   Color, Urine STRAW (*)    Glucose, UA >=500 (*)    Leukocytes,Ua MODERATE (*)    All other components within normal limits  CBC WITH DIFFERENTIAL/PLATELET  BLOOD GAS, VENOUS      Medicines ordered and prescription drug management: No orders of the defined types were placed in this encounter.   -I have reviewed the patients home medicines and have made adjustments as needed    Cardiac Monitoring: The patient was maintained on a cardiac monitor.  I personally viewed and interpreted the cardiac monitored which showed an underlying rhythm of: Normal sinus rhythm  Social Determinants of Health:  Factors impacting patients care include: Lack of access to primary care   Reevaluation: After the interventions noted above, I reevaluated the patient and found that they have :improved  Co morbidities that complicate the patient evaluation  Past Medical History:  Diagnosis Date   Acute tear lateral meniscus, right, initial encounter 08/29/2019   Asthma    Closed fracture of upper end of fibula 09/21/2018   Diabetes mellitus without complication (HCC)    TYPE II   Hyperglycemia 09/15/2016   Migraines    WPW (Wolff-Parkinson-White syndrome)    s/p ablation at age 61      Dispostion: I considered admission for this patient, however with her workup and recent EEG she is appropriate for outpatient follow-up.     Final Clinical Impression(s) / ED Diagnoses Final diagnoses:  Seizure-like activity (HCC)  Hyperglycemia     @PCDICTATION @    Mannie Fairy DASEN, DO 09/28/24 0556     [1]  Social History Tobacco Use   Smoking status: Some Days    Current packs/day: 0.10    Types: Cigarettes    Passive exposure: Current   Smokeless tobacco: Former  Building Services Engineer status: Some Days  Substance Use Topics   Alcohol  use: No   Drug use: No      Mannie Fairy T, DO 09/28/24 0602  "

## 2024-09-28 NOTE — Telephone Encounter (Signed)
 Pt called in this afternoon and she is returning a call in reference to her results. Thanks

## 2024-09-28 NOTE — Telephone Encounter (Signed)
 See result note.

## 2024-09-28 NOTE — ED Triage Notes (Signed)
 Pt brought by EMS from home, As per pt she's not feeling well for couple of days now and also she's having this aura of having seizure. EMS said that she had 2 seizure episodes. Pt has history of seizure and taking her meds regularly. Pt is also hyperglycemic(379). Alert x4.

## 2024-10-22 ENCOUNTER — Encounter: Payer: Self-pay | Admitting: Neurology

## 2024-10-22 ENCOUNTER — Other Ambulatory Visit: Payer: Self-pay

## 2024-10-25 ENCOUNTER — Encounter: Payer: Self-pay | Admitting: "Endocrinology

## 2024-10-25 ENCOUNTER — Other Ambulatory Visit

## 2024-10-25 ENCOUNTER — Other Ambulatory Visit: Payer: Self-pay

## 2024-10-25 ENCOUNTER — Ambulatory Visit: Admitting: "Endocrinology

## 2024-10-25 ENCOUNTER — Telehealth: Payer: Self-pay | Admitting: "Endocrinology

## 2024-10-25 VITALS — BP 108/72 | HR 97 | Ht 66.0 in | Wt 184.0 lb

## 2024-10-25 DIAGNOSIS — E782 Mixed hyperlipidemia: Secondary | ICD-10-CM | POA: Diagnosis not present

## 2024-10-25 DIAGNOSIS — Z7984 Long term (current) use of oral hypoglycemic drugs: Secondary | ICD-10-CM

## 2024-10-25 DIAGNOSIS — E1165 Type 2 diabetes mellitus with hyperglycemia: Secondary | ICD-10-CM

## 2024-10-25 DIAGNOSIS — Z7985 Long-term (current) use of injectable non-insulin antidiabetic drugs: Secondary | ICD-10-CM | POA: Diagnosis not present

## 2024-10-25 DIAGNOSIS — Z794 Long term (current) use of insulin: Secondary | ICD-10-CM

## 2024-10-25 LAB — POCT GLYCOSYLATED HEMOGLOBIN (HGB A1C): Hemoglobin A1C: 11 % — AB (ref 4.0–5.6)

## 2024-10-25 MED ORDER — OZEMPIC (0.25 OR 0.5 MG/DOSE) 2 MG/3ML ~~LOC~~ SOPN
0.2500 mg | PEN_INJECTOR | SUBCUTANEOUS | 1 refills | Status: DC
  Filled 2024-10-25 – 2024-10-31 (×2): qty 3, 56d supply, fill #0

## 2024-10-25 MED ORDER — DEXCOM G7 15 DAY SENSOR MISC
1.0000 | 3 refills | Status: AC
  Filled 2024-10-25: qty 2, 20d supply, fill #0
  Filled 2024-10-31 (×2): qty 6, 90d supply, fill #0

## 2024-10-25 MED ORDER — BLOOD GLUCOSE MONITORING SUPPL DEVI
1.0000 | Freq: Three times a day (TID) | 0 refills | Status: AC
  Filled 2024-10-25: qty 1, fill #0

## 2024-10-25 MED ORDER — LANCET DEVICE MISC
1.0000 | Freq: Three times a day (TID) | 0 refills | Status: AC
  Filled 2024-10-25: qty 1, 30d supply, fill #0

## 2024-10-25 MED ORDER — BLOOD GLUCOSE TEST VI STRP
1.0000 | ORAL_STRIP | Freq: Three times a day (TID) | 3 refills | Status: AC
  Filled 2024-10-25: qty 100, 34d supply, fill #0

## 2024-10-25 MED ORDER — LANCETS MISC
1.0000 | 0 refills | Status: AC
  Filled 2024-10-25: qty 100, 25d supply, fill #0

## 2024-10-25 NOTE — Telephone Encounter (Signed)
 Patient came in to office and states that she went to the pharmacy to get her refill on Ozempic  (0.25 or 0.5 MG/DOSE) Semaglutide ,0.25 or 0.5MG /DOS, (OZEMPIC , 0.25 OR 0.5 MG/DOSE,) 2 MG/3ML SOPN and they told her that it needs a prior authorization.

## 2024-10-25 NOTE — Progress Notes (Signed)
 "   Outpatient Endocrinology Note Michelle Birmingham, MD  10/25/24   Kionna Brier Orebaugh [birth year] [id number]  Referring Provider: Jerome Heron Ruth, PA-C Primary Care Provider: Jerome Heron Ruth, PA-C Reason for consultation: Subjective   Assessment & Plan  Jillene was seen today for diabetes.  Diagnoses and all orders for this visit:  Uncontrolled type 2 diabetes mellitus with hyperglycemia (HCC) -     POCT glycosylated hemoglobin (Hb A1C) -     Lipid panel -     Microalbumin / creatinine urine ratio  Long-term (current) use of injectable non-insulin  antidiabetic drugs  Long term (current) use of oral hypoglycemic drugs  Long-term insulin  use (HCC)  Insulin  dose changed (HCC)  Mixed hypercholesterolemia and hypertriglyceridemia  Other orders -     Semaglutide ,0.25 or 0.5MG /DOS, (OZEMPIC , 0.25 OR 0.5 MG/DOSE,) 2 MG/3ML SOPN; Inject 0.25 mg into the skin once a week. -     Blood Glucose Monitoring Suppl DEVI; 1 each by Does not apply route in the morning, at noon, and at bedtime. May substitute to any manufacturer covered by patient's insurance. -     Glucose Blood (BLOOD GLUCOSE TEST STRIPS) STRP; Test in the morning, at noon, and at bedtime. -     Lancet Device MISC; 1 each by Does not apply route in the morning, at noon, and at bedtime. May substitute to any manufacturer covered by patient's insurance. -     Lancets MISC; Use up to four times daily as directed. (FOR ICD-10 E10.9, E11.9). -     Continuous Glucose Sensor (DEXCOM G7 15 DAY SENSOR) MISC; Use as directed   Diabetes Type II complicated by neuropathy, retinopathy, No results found for: GFR Hba1c goal less than 7, current Hba1c is  Lab Results  Component Value Date   HGBA1C 11.0 (A) 10/25/2024   Will recommend the following: Lantus  35 units qam. Increase by 1 unit every day until fasting blood sugar is close to 120. Stay on that dose.   Humalog  20 units after meals Ozempic  0.25 mg/week Metformin  500mg   bid   10/25/24: Cannot tolerate mounjaro  due to vomiting  No known contraindications/side effects to any of above medications Glucagon  discussed and prescribed with refills on 10/25/24  -Last LD and Tg are as follows: No results found for: [facility]  Lab Results  Component Value Date   TRIG 494 (H) 03/09/2021   -On atorvastatin  20 mg every day, started in 12/3023  -Follow low fat diet and exercise   -Blood pressure goal <140/90 - Microalbumin/creatinine goal is < 30 -Last MA/Cr is as follows: No results found for: MICROALBUR, MALB24HUR -not on ACE/ARB  -diet changes including salt restriction -limit eating outside -counseled BP targets per standards of diabetes care -uncontrolled blood pressure can lead to retinopathy, nephropathy and cardiovascular and atherosclerotic heart disease  Reviewed and counseled on: -A1C target -Blood sugar targets -Complications of uncontrolled diabetes  -Checking blood sugar before meals and bedtime and bring log next visit -All medications with mechanism of action and side effects -Hypoglycemia management: rule of 15's, Glucagon  Emergency Kit and medical alert ID -low-carb low-fat plate-method diet -At least 20 minutes of physical activity per day -Annual dilated retinal eye exam and foot exam -compliance and follow up needs -follow up as scheduled or earlier if problem gets worse  Call if blood sugar is less than 70 or consistently above 250    Take a 15 gm snack of carbohydrate at bedtime before you go to sleep if your blood sugar is  less than 100.    If you are going to fast after midnight for a test or procedure, ask your physician for instructions on how to reduce/decrease your insulin  dose.    Call if blood sugar is less than 70 or consistently above 250  -Treating a low sugar by rule of 15  (15 gms of sugar every 15 min until sugar is more than 70) If you feel your sugar is low, test your sugar to be sure If your sugar is low  (less than 70), then take 15 grams of a fast acting Carbohydrate (3-4 glucose tablets or glucose gel or 4 ounces of juice or regular soda) Recheck your sugar 15 min after treating low to make sure it is more than 70 If sugar is still less than 70, treat again with 15 grams of carbohydrate          Don't drive the hour of hypoglycemia  If unconscious/unable to eat or drink by mouth, use glucagon  injection or nasal spray baqsimi  and call 911. Can repeat again in 15 min if still unconscious.  Return in about 4 weeks (around 11/22/2024) for visit, labs today.   I have reviewed current medications, nurse's notes, allergies, vital signs, past medical and surgical history, family medical history, and social history for this encounter. Counseled patient on symptoms, examination findings, lab findings, imaging results, treatment decisions and monitoring and prognosis. The patient understood the recommendations and agrees with the treatment plan. All questions regarding treatment plan were fully answered.  Michelle Birmingham, MD  10/25/24    History of Present Illness Michelle Gibson is a 49 y.o. year old female who presents for evaluation of Type II diabetes mellitus.  Danielle Dawn Jokerst was first diagnosed in 61s.   Diabetes education +  Home diabetes regimen: Lantus  35 units qam Humalog  15 units after meals Mounjaro  2.5mg /week Metformin  500mg  bid   COMPLICATIONS -  MI/Stroke +  retinopathy +  neuropathy -  nephropathy  11/24/2023 Mg 1.8 TG 402 CHOL 298 No LDL A1c 10.5 GFR 7 6 AST 2 3 ALT 23  BLOOD SUGAR DATA CGM interpretation: At today's visit, we reviewed her CGM downloads. The full report is scanned in the media. Reviewing the CGM trends, BG are elevated across the day.   Physical Exam  BP 108/72   Pulse 97   Ht 5' 6 (1.676 m)   Wt 184 lb (83.5 kg)   SpO2 97%   BMI 29.70 kg/m    Constitutional: well developed, well nourished Head: normocephalic, atraumatic Eyes: sclera  anicteric, no redness Neck: supple Lungs: normal respiratory effort Neurology: alert and oriented Skin: dry, no appreciable rashes Musculoskeletal: no appreciable defects Psychiatric: normal mood and affect Diabetic Foot Exam - Simple   Simple Foot Form Diabetic Foot exam was performed with the following findings: Yes 10/25/2024  2:11 PM  Visual Inspection No deformities, no ulcerations, no other skin breakdown bilaterally: Yes Sensation Testing Intact to touch and monofilament testing bilaterally: Yes Pulse Check See comments: Yes Comments +B/L dorsalis pedis       Current Medications Patient's Medications  New Prescriptions   BLOOD GLUCOSE MONITORING SUPPL DEVI    1 each by Does not apply route in the morning, at noon, and at bedtime. May substitute to any manufacturer covered by patient's insurance.   CONTINUOUS GLUCOSE SENSOR (DEXCOM G7 15 DAY SENSOR) MISC    Use as directed   GLUCOSE BLOOD (BLOOD GLUCOSE TEST STRIPS) STRP    Test in  the morning, at noon, and at bedtime.   LANCET DEVICE MISC    1 each by Does not apply route in the morning, at noon, and at bedtime. May substitute to any manufacturer covered by patient's insurance.   LANCETS MISC    Use up to four times daily as directed. (FOR ICD-10 E10.9, E11.9).   SEMAGLUTIDE ,0.25 OR 0.5MG /DOS, (OZEMPIC , 0.25 OR 0.5 MG/DOSE,) 2 MG/3ML SOPN    Inject 0.25 mg into the skin once a week.  Previous Medications   ACCU-CHEK SOFTCLIX LANCETS LANCETS    Use 4 (four) times daily -  before meals and at bedtime.   ATORVASTATIN  (LIPITOR) 20 MG TABLET    Take 1 tablet (20 mg total) by mouth daily.   ATORVASTATIN  (LIPITOR) 40 MG TABLET    Take 40 mg by mouth at bedtime.   BENZONATATE  (TESSALON ) 100 MG CAPSULE    Take 1 capsule by mouth every 8 (eight) hours.   BLOOD GLUCOSE MONITORING SUPPL (ACCU-CHEK GUIDE ME) W/DEVICE KIT    Use as directed 4 (four) times daily -  before meals and at bedtime.   BLOOD GLUCOSE MONITORING SUPPL (ONETOUCH  VERIO) W/DEVICE KIT    Use to check blood sugar TID.   BUPRENORPHINE (BUTRANS) 10 MCG/HR PTWK    APPLY 1 PATCH TO THE SKIN ONCE A WEEK   BUPRENORPHINE (BUTRANS) 15 MCG/HR    1 patch once a week.   CONTINUOUS BLOOD GLUC TRANSMIT (DEXCOM G6 TRANSMITTER) MISC    1 each by Does not apply route 4 (four) times daily -  before meals and at bedtime.   CONTINUOUS GLUCOSE SENSOR (DEXCOM G7 SENSOR) MISC    USE AS DIRECTED   CONTINUOUS GLUCOSE SENSOR (DEXCOM G7 SENSOR) MISC    1 Device by Does not apply route continuous.   FLUTICASONE  (FLONASE ) 50 MCG/ACT NASAL SPRAY    Place 1 spray into both nostrils daily.   GABAPENTIN  (NEURONTIN ) 100 MG CAPSULE       GABAPENTIN  (NEURONTIN ) 300 MG CAPSULE    Take 2 capsules (600 mg total) by mouth 4 (four) times daily.   GABAPENTIN  (NEURONTIN ) 600 MG TABLET    Take 600 mg by mouth 3 (three) times daily.   GLUCAGON  (BAQSIMI  ONE PACK) 3 MG/DOSE POWD    Place 1 Device into the nose as needed (Low blood sugar with impaired consciousness).   GLUCOSE BLOOD (TRUE METRIX BLOOD GLUCOSE TEST) TEST STRIP    Use 4 (four) times daily -  before meals and at bedtime. Use as instructed   HYDROCODONE -ACETAMINOPHEN  (NORCO) 10-325 MG TABLET    Take 1 tablet by mouth 4 (four) times daily as needed.   IBUPROFEN  (ADVIL ) 800 MG TABLET    TAKE 1 TABLET (800 MG TOTAL) BY MOUTH 3 (THREE) TIMES DAILY. X 7 DAYS THEN AS NEEDED FOR PAIN EAT PRIOR TO TAKING THIS MEDICATION   INSULIN  GLARGINE (LANTUS ) 100 UNIT/ML SOLOSTAR PEN    Inject 34 units into the skin once daily. After 1 week, if fasting blood sugar > 200, increase to 40 units in the morning.   INSULIN  LISPRO (HUMALOG ) 100 UNIT/ML KWIKPEN    Inject 10 Units into the skin 3 (three) times daily. If blood sugar before you eat is under 150, do not take.   INSULIN  PEN NEEDLE 32G X 4 MM MISC    Use a total of 4 pen needles a day.   METFORMIN  (GLUCOPHAGE ) 1000 MG TABLET    Take 1 tablet (1,000 mg total) by mouth  2 (two) times daily with a meal.    METFORMIN  (GLUCOPHAGE ) 500 MG TABLET       NALOXONE (NARCAN) NASAL SPRAY 4 MG/0.1 ML    SMARTSIG:Both Nares   OMEPRAZOLE (PRILOSEC) 40 MG CAPSULE    Take 40 mg by mouth daily.   ONDANSETRON  (ZOFRAN -ODT) 4 MG DISINTEGRATING TABLET    Place 1 tab under the tongue every 4 hours as needed for nausea and vomitting   ONETOUCH DELICA LANCETS 33G MISC    Use to check blood sugar TID.   PROPAFENONE (RYTHMOL SR) 225 MG 12 HR CAPSULE       PROPAFENONE (RYTHMOL) 150 MG TABLET    Take 150 mg by mouth 3 (three) times daily.   TAMSULOSIN  (FLOMAX ) 0.4 MG CAPS CAPSULE    Take 1 capsule (0.4 mg total) by mouth daily.   TIZANIDINE (ZANAFLEX) 2 MG TABLET    Take 2 mg by mouth 3 (three) times daily as needed.   TIZANIDINE (ZANAFLEX) 4 MG TABLET    Take 4 mg by mouth 3 (three) times daily.   TOPIRAMATE  (TOPAMAX ) 50 MG TABLET    Take 1 tablet (50 mg total) by mouth 2 (two) times daily.  Modified Medications   No medications on file  Discontinued Medications   TIRZEPATIDE  (MOUNJARO ) 5 MG/0.5ML PEN    Inject 5 mg into the skin once a week.    Allergies Allergies  Allergen Reactions   Penicillamine Dermatitis   Ciprofloxacin Hives and Dermatitis   Lyrica  Cr [Pregabalin  Er] Other (See Comments)    Suicidal ideation   Doxycycline  Rash    Yeast infection   Flecainide Hives, Nausea And Vomiting, Rash, Dermatitis and Nausea Only   Latex Other (See Comments), Rash, Dermatitis and Itching    Causes blisters, also   Penicillin G Rash    Has patient had a PCN reaction causing immediate rash, facial/tongue/throat swelling, SOB or lightheadedness with hypotension: yes Has patient had a PCN reaction causing severe rash involving mucus membranes or skin necrosis: yes Has patient had a PCN reaction that required hospitalization: no Has patient had a PCN reaction occurring within the last 10 years: no If all of the above answers are NO, then may proceed with Cephalosporin use.    Penicillins Dermatitis    Has patient  had a PCN reaction causing immediate rash, facial/tongue/throat swelling, SOB or lightheadedness with hypotension: yes Has patient had a PCN reaction causing severe rash involving mucus membranes or skin necrosis: yes Has patient had a PCN reaction that required hospitalization: no Has patient had a PCN reaction occurring within the last 10 years: no If all of the above answers are NO, then may proceed with Cephalosporin use.    Past Medical History Past Medical History:  Diagnosis Date   Acute tear lateral meniscus, right, initial encounter 08/29/2019   Asthma    Closed fracture of upper end of fibula 09/21/2018   Diabetes mellitus without complication (HCC)    TYPE II   Hyperglycemia 09/15/2016   Migraines    WPW (Wolff-Parkinson-White syndrome)    s/p ablation at age 6    Past Surgical History Past Surgical History:  Procedure Laterality Date   ABDOMINAL HYSTERECTOMY     CARDIAC SURGERY     CESAREAN SECTION     KNEE ARTHROSCOPY WITH LATERAL MENISECTOMY Right 08/29/2019   Procedure: PARTIAL LATERAL MENISECTOMY;  Surgeon: Jerri Kay HERO, MD;  Location: [facility];  Service: Orthopedics;  Laterality: Right;   KNEE ARTHROSCOPY  WITH MEDIAL MENISECTOMY Right 08/29/2019   Procedure: RIGHT KNEE ARTHROSCOPY WITH PARTIAL MEDIAL MENISECTOMY;  Surgeon: Jerri Kay HERO, MD;  Location: [facility];  Service: Orthopedics;  Laterality: Right;    Family History family history includes CAD (age of onset: 54) in her father and mother; Diabetes in her mother; Hypertension in her father and mother.  Social History Social History   Socioeconomic History   Marital status: Divorced    Spouse name: Not on file   Number of children: Not on file   Years of education: Not on file   Highest education level: 8th grade  Occupational History   Not on file  Tobacco Use   Smoking status: Some Days    Current packs/day: 0.10    Types: Cigarettes    Passive exposure: Current    Smokeless tobacco: Former  Building Services Engineer status: Some Days  Substance and Sexual Activity   Alcohol  use: No   Drug use: No   Sexual activity: Not Currently    Birth control/protection: Surgical  Other Topics Concern   Not on file  Social History Narrative   Right Handed    Lives in a mobile home. Lives with daughter    Social Drivers of Health   Tobacco Use: High Risk (10/25/2024)   Patient History    Smoking Tobacco Use: Some Days    Smokeless Tobacco Use: Former    Passive Exposure: Current  Programmer, Applications: High Risk (02/15/2023)   Overall Financial Resource Strain (CARDIA)    Difficulty of Paying Living Expenses: Very hard  Food Insecurity: Food Insecurity Present (01/27/2024)   Hunger Vital Sign    Worried About Running Out of Food in the Last Year: Sometimes true    Ran Out of Food in the Last Year: Sometimes true  Transportation Needs: Unmet Transportation Needs (02/15/2023)   PRAPARE - Transportation    Lack of Transportation (Medical): Yes    Lack of Transportation (Non-Medical): Yes  Physical Activity: Sufficiently Active (02/15/2023)   Exercise Vital Sign    Days of Exercise per Week: 5 days    Minutes of Exercise per Session: 30 min  Stress: Stress Concern Present (02/15/2023)   [employer] of Occupational Health - Occupational Stress Questionnaire    Feeling of Stress : Very much  Social Connections: Socially Isolated (02/15/2023)   Social Connection and Isolation Panel    Frequency of Communication with Friends and Family: Never    Frequency of Social Gatherings with Friends and Family: Never    Attends Religious Services: Never    Database Administrator or Organizations: No    Attends Engineer, Structural: Not on file    Marital Status: Divorced  Intimate Partner Violence: Not At Risk (02/15/2023)   Humiliation, Afraid, Rape, and Kick questionnaire    Fear of Current or Ex-Partner: No    Emotionally Abused: No    Physically Abused:  No    Sexually Abused: No  Depression (PHQ2-9): Low Risk (01/27/2024)   Depression (PHQ2-9)    PHQ-2 Score: 0  Alcohol  Screen: Low Risk (02/15/2023)   Alcohol  Screen    Last Alcohol  Screening Score (AUDIT): 0  Housing: High Risk (02/15/2023)   Housing    Last Housing Risk Score: 2  Utilities: Not At Risk (02/15/2023)   AHC Utilities    Threatened with loss of utilities: No  Health Literacy: Not on file    Lab Results  Component Value Date   HGBA1C  11.0 (A) 10/25/2024   HGBA1C 8.0 (A) 12/29/2022   HGBA1C 8.5 (A) 08/18/2021   No results found for: CHOL No results found for: HDL No results found for: [facility] Lab Results  Component Value Date   TRIG 494 (H) 03/09/2021   No results found for: [facility] Lab Results  Component Value Date   CREATININE 0.80 09/28/2024   No results found for: GFR No results found for: [facility] CURRENT    Component Value Date/Time   NA 136 09/28/2024 0443   NA 141 08/18/2021 1417   K 3.9 09/28/2024 0443   CL 99 09/28/2024 0443   CO2 25 09/28/2024 0443   GLUCOSE 331 (H) 09/28/2024 0443   BUN 18 09/28/2024 0443   BUN 18 08/18/2021 1417   CREATININE 0.80 09/28/2024 0443   CREATININE 1.00 10/07/2016 1506   CALCIUM  9.5 09/28/2024 0443   PROT 7.5 09/05/2024 1230   PROT 8.0 08/18/2021 1417   ALBUMIN 4.3 09/05/2024 1230   ALBUMIN 5.0 (H) 08/18/2021 1417   AST 23 09/05/2024 1230   ALT 14 09/05/2024 1230   ALKPHOS 82 09/05/2024 1230   BILITOT 0.3 09/05/2024 1230   BILITOT <0.2 08/18/2021 1417   GFRNONAA >60 09/28/2024 0443   GFRAA >60 07/12/2020 1046      Latest Ref Rng & Units 09/28/2024    4:43 AM 09/05/2024   12:30 PM 08/30/2024   11:46 PM  BMP  Glucose 70 - 99 mg/dL 668  795  814   BUN 6 - 20 mg/dL 18  15  19    Creatinine 0.44 - 1.00 mg/dL 9.19  9.22  8.93   Sodium 135 - 145 mmol/L 136  141  141   Potassium 3.5 - 5.1 mmol/L 3.9  3.2  3.9   Chloride 98 - 111 mmol/L 99  104  102   CO2 22 - 32 mmol/L 25  26  25     Calcium  8.9 - 10.3 mg/dL 9.5  9.2  89.8        Component Value Date/Time   WBC 6.7 09/28/2024 0443   RBC 4.46 09/28/2024 0443   HGB 13.6 09/28/2024 0443   HGB 14.7 08/18/2021 1417   HCT 40.0 09/28/2024 0443   HCT 44.1 08/18/2021 1417   PLT 306 09/28/2024 0443   PLT 430 08/18/2021 1417   MCV 89.7 09/28/2024 0443   MCV 87 08/18/2021 1417   MCH 30.5 09/28/2024 0443   MCHC 34.0 09/28/2024 0443   RDW 12.7 09/28/2024 0443   RDW 13.1 08/18/2021 1417   LYMPHSABS 2.6 09/28/2024 0443   MONOABS 0.4 09/28/2024 0443   EOSABS 0.2 09/28/2024 0443   BASOSABS 0.0 09/28/2024 0443     Parts of this note may have been dictated using voice recognition software. There may be variances in spelling and vocabulary which are unintentional. Not all errors are proofread. Please notify the dino if any discrepancies are noted or if the meaning of any statement is not clear.   "

## 2024-10-25 NOTE — Patient Instructions (Addendum)
 Will recommend the following: Lantus  35 units qam. Increase by 1 unit every day until fasting blood sugar is close to 120. Stay on that dose.   Humalog  15-20 units after meals Ozempic  0.25 mg/week Metformin  500mg  bid   ________    Goals of DM therapy:  Morning Fasting blood sugar: 80-140  Blood sugar before meals: 80-140 Bed time blood sugar: 100-150  A1C <7%, limited only by hypoglycemia  1.Diabetes medications and their side effects discussed, including hypoglycemia    2. Check blood glucose:  a) Always check blood sugars before driving. Please see below (under hypoglycemia) on how to manage b) Check a minimum of 3 times/day or more as needed when having symptoms of hypoglycemia.   c) Try to check blood glucose before sleeping/in the middle of the night to ensure that it is remaining stable and not dropping less than 100 d) Check blood glucose more often if sick  3. Diet: a) 3 meals per day schedule b: Restrict carbs to 60-70 grams (4 servings) per meal c) Colorful vegetables - 3 servings a day, and low sugar fruit 2 servings/day Plate control method: 1/4 plate protein, 1/4 starch, 1/2 green, yellow, or red vegetables d) Avoid carbohydrate snacks unless hypoglycemic episode, or increased physical activity  4. Regular exercise as tolerated, preferably 3 or more hours a week  5. Hypoglycemia: a)  Do not drive or operate machinery without first testing blood glucose to assure it is over 90 mg%, or if dizzy, lightheaded, not feeling normal, etc, or  if foot or leg is numb or weak. b)  If blood glucose less than 70, take four 5gm Glucose tabs or 15-30 gm Glucose gel.  Repeat every 15 min as needed until blood sugar is >100 mg/dl. If hypoglycemia persists then call 911.   6. Sick day management: a) Check blood glucose more often b) Continue usual therapy if blood sugars are elevated.   7. Contact the doctor immediately if blood glucose is frequently <60 mg/dl, or an episode of  severe hypoglycemia occurs (where someone had to give you glucose/  glucagon  or if you passed out from a low blood glucose), or if blood glucose is persistently >350 mg/dl, for further management  8. A change in level of physical activity or exercise and a change in diet may also affect your blood sugar. Check blood sugars more often and call if needed.  Instructions: 1. Bring glucose meter, blood glucose records on every visit for review 2. Continue to follow up with primary care physician and other providers for medical care 3. Yearly eye  and foot exam 4. Please get blood work done prior to the next appointment

## 2024-10-26 ENCOUNTER — Ambulatory Visit: Payer: Self-pay | Admitting: "Endocrinology

## 2024-10-26 ENCOUNTER — Other Ambulatory Visit: Payer: Self-pay

## 2024-10-26 ENCOUNTER — Other Ambulatory Visit: Payer: Self-pay | Admitting: "Endocrinology

## 2024-10-26 ENCOUNTER — Telehealth: Payer: Self-pay

## 2024-10-26 LAB — LIPID PANEL
Cholesterol: 355 mg/dL — ABNORMAL HIGH
HDL: 41 mg/dL — ABNORMAL LOW
LDL Cholesterol (Calc): 258 mg/dL — ABNORMAL HIGH
Non-HDL Cholesterol (Calc): 314 mg/dL — ABNORMAL HIGH
Total CHOL/HDL Ratio: 8.7 (calc) — ABNORMAL HIGH
Triglycerides: 331 mg/dL — ABNORMAL HIGH

## 2024-10-26 LAB — MICROALBUMIN / CREATININE URINE RATIO
Creatinine, Urine: 271 mg/dL (ref 20–275)
Microalb Creat Ratio: 5 mg/g{creat}
Microalb, Ur: 1.4 mg/dL

## 2024-10-26 MED ORDER — ROSUVASTATIN CALCIUM 40 MG PO TABS
40.0000 mg | ORAL_TABLET | Freq: Every day | ORAL | 3 refills | Status: AC
  Filled 2024-10-26: qty 90, 90d supply, fill #0

## 2024-10-26 NOTE — Telephone Encounter (Signed)
 Pt needs PA for  OZEMPIC  and DEXCOM G7. Thanks

## 2024-10-29 ENCOUNTER — Other Ambulatory Visit: Payer: Self-pay

## 2024-10-30 ENCOUNTER — Other Ambulatory Visit (HOSPITAL_COMMUNITY): Payer: Self-pay

## 2024-10-30 ENCOUNTER — Telehealth: Payer: Self-pay

## 2024-10-30 NOTE — Telephone Encounter (Signed)
 Pharmacy Patient Advocate Encounter   Received notification from Pt Calls Messages that prior authorization for Ozempic  (0.25 or 0.5 MG/DOSE) 2MG /3ML pen-injectors is required/requested.   Insurance verification completed.   The patient is insured through [facility].   Per test claim: PA required; PA submitted to above mentioned insurance via Latent Key/confirmation #/EOC [id number] Status is pending

## 2024-10-30 NOTE — Telephone Encounter (Signed)
 Pharmacy Patient Advocate Encounter   Received notification from Pt Calls Messages that prior authorization for Dexcom G7 15 day sensor is required/requested.   Insurance verification completed.   The patient is insured through [facility].   Per test claim: PA required; PA submitted to above mentioned insurance via Latent Key/confirmation #/EOC [id number] Status is pending

## 2024-10-31 ENCOUNTER — Other Ambulatory Visit: Payer: Self-pay

## 2024-10-31 ENCOUNTER — Other Ambulatory Visit (HOSPITAL_COMMUNITY): Payer: Self-pay

## 2024-11-01 ENCOUNTER — Other Ambulatory Visit (HOSPITAL_COMMUNITY): Payer: Self-pay

## 2024-11-01 NOTE — Telephone Encounter (Signed)
 Pharmacy Patient Advocate Encounter  Received notification from [employer] that Prior Authorization for  Dexcom G7 15 day sensor has been APPROVED from 10/31/24 to 04/30/25. Ran test claim, Copay is $0.00. This test claim was processed through [facility]- copay amounts may vary at other pharmacies due to pharmacy/plan contracts, or as the patient moves through the different stages of their insurance plan.   PA #/Case ID/Reference #: [id number]

## 2024-11-01 NOTE — Telephone Encounter (Signed)
 Pharmacy Patient Advocate Encounter  Received notification from [employer] that Prior Authorization for Ozempic  (0.25 or 0.5 MG/DOSE) 2MG /3ML pen-injectors has been APPROVED from 10/31/24 to 10/31/25. Ran test claim, Copay is $4.00. This test claim was processed through [facility]- copay amounts may vary at other pharmacies due to pharmacy/plan contracts, or as the patient moves through the different stages of their insurance plan.   PA #/Case ID/Reference #: [id number]

## 2024-11-12 ENCOUNTER — Other Ambulatory Visit: Payer: Self-pay

## 2024-11-13 ENCOUNTER — Other Ambulatory Visit: Payer: Self-pay

## 2024-11-15 ENCOUNTER — Ambulatory Visit: Admitting: "Endocrinology

## 2024-11-15 ENCOUNTER — Encounter: Payer: Self-pay | Admitting: "Endocrinology

## 2024-11-15 ENCOUNTER — Other Ambulatory Visit: Payer: Self-pay

## 2024-11-15 VITALS — BP 140/90 | HR 87 | Ht 66.0 in | Wt 186.0 lb

## 2024-11-15 DIAGNOSIS — Z7985 Long-term (current) use of injectable non-insulin antidiabetic drugs: Secondary | ICD-10-CM

## 2024-11-15 DIAGNOSIS — Z7984 Long term (current) use of oral hypoglycemic drugs: Secondary | ICD-10-CM

## 2024-11-15 DIAGNOSIS — E1141 Type 2 diabetes mellitus with diabetic mononeuropathy: Secondary | ICD-10-CM

## 2024-11-15 DIAGNOSIS — E782 Mixed hyperlipidemia: Secondary | ICD-10-CM

## 2024-11-15 DIAGNOSIS — Z794 Long term (current) use of insulin: Secondary | ICD-10-CM

## 2024-11-15 MED ORDER — INSULIN LISPRO (1 UNIT DIAL) 100 UNIT/ML (KWIKPEN)
20.0000 [IU] | PEN_INJECTOR | Freq: Three times a day (TID) | SUBCUTANEOUS | 3 refills | Status: AC
  Filled 2024-11-15: qty 21, 28d supply, fill #0

## 2024-11-15 MED ORDER — OZEMPIC (0.25 OR 0.5 MG/DOSE) 2 MG/3ML ~~LOC~~ SOPN
0.5000 mg | PEN_INJECTOR | SUBCUTANEOUS | 0 refills | Status: AC
  Filled 2024-11-15: qty 3, 30d supply, fill #0

## 2024-11-15 MED ORDER — INSULIN GLARGINE 100 UNIT/ML SOLOSTAR PEN
60.0000 [IU] | PEN_INJECTOR | Freq: Every day | SUBCUTANEOUS | 1 refills | Status: AC
  Filled 2024-11-15: qty 18, 30d supply, fill #0

## 2024-11-15 NOTE — Progress Notes (Signed)
 "   Outpatient Endocrinology Note Michelle Birmingham, MD  11/15/24   Michelle Gibson [facility] [birth year] [id number]  Referring Provider: Jerome Heron Ruth, PA-C Primary Care Provider: Jerome Heron Ruth, PA-C Reason for consultation: Subjective   Assessment & Plan  Diagnoses and all orders for this visit:  Long-term (current) use of injectable non-insulin  antidiabetic drugs  Type 2 diabetes mellitus with diabetic mononeuropathy, with long-term current use of insulin  (HCC) -     insulin  lispro (HUMALOG ) 100 UNIT/ML KwikPen; Inject 20-25 Units into the skin 3 (three) times daily. If blood sugar before you eat is under 70, do not take it. -     insulin  glargine (LANTUS ) 100 UNIT/ML Solostar Pen; Inject 60 Units into the skin daily.  Long term (current) use of oral hypoglycemic drugs  Long-term insulin  use (HCC)  Insulin  dose changed (HCC)  Mixed hypercholesterolemia and hypertriglyceridemia  Other orders -     Semaglutide ,0.25 or 0.5MG /DOS, (OZEMPIC , 0.25 OR 0.5 MG/DOSE,) 2 MG/3ML SOPN; Inject 0.5 mg into the skin once a week.    Diabetes Type II complicated by neuropathy, retinopathy, No results found for: GFR Hba1c goal less than 7, current Hba1c is  Lab Results  Component Value Date   HGBA1C 11.0 (A) 10/25/2024   Will recommend the following: Ozempic  0.5 mg/week-no S/E Metformin  500mg  bid  Lantus  60 units qam (in a step wise approach: increase to 55 units, if not low BG <70, increase to 60 units) Humalog  20 units 15 min before meals Humalog  correction scale: Use in addition to your meal time/short acting insulin  based on blood sugars as follows:  151 - 180: 1 unit 181 - 210: 2 units 211 - 240: 3 units 241 - 270: 4 units 271 - 300: 5 units 301 - 330: 6 units 331 - 360: 7 units 361 - 390: 8 units 391 - 420: 9 units   Cut down Humaloginsulin to half if eating only half a meal or if blood sugar is between 71-100 before a meal Skip Humalog  insulin  if blood sugar is  less than 70 and treat with 15 gms of carbohydrates every 15 min until blood sugar is more than 100   10/25/24: Cannot tolerate mounjaro  due to vomiting  No known contraindications/side effects to any of above medications Glucagon  discussed and prescribed with refills on 11/15/24  -Last LD and Tg are as follows: Lab Results  Component Value Date   LDLCALC 258 (H) 10/25/2024    Lab Results  Component Value Date   TRIG 331 (H) 10/25/2024   -On atorvastatin  20 mg every day, started in 12/3023: LDL 258 -10/26/24: started rosuvastatin  40 mg every day -Follow low fat diet and exercise   -Blood pressure goal <140/90 - Microalbumin/creatinine goal is < 30 -Last MA/Cr is as follows: Lab Results  Component Value Date   MICROALBUR 1.4 10/25/2024   -not on ACE/ARB  -diet changes including salt restriction -limit eating outside -counseled BP targets per standards of diabetes care -uncontrolled blood pressure can lead to retinopathy, nephropathy and cardiovascular and atherosclerotic heart disease  Reviewed and counseled on: -A1C target -Blood sugar targets -Complications of uncontrolled diabetes  -Checking blood sugar before meals and bedtime and bring log next visit -All medications with mechanism of action and side effects -Hypoglycemia management: rule of 15's, Glucagon  Emergency Kit and medical alert ID -low-carb low-fat plate-method diet -At least 20 minutes of physical activity per day -Annual dilated retinal eye exam and foot exam -compliance and follow up needs -follow up  as scheduled or earlier if problem gets worse  Call if blood sugar is less than 70 or consistently above 250    Take a 15 gm snack of carbohydrate at bedtime before you go to sleep if your blood sugar is less than 100.    If you are going to fast after midnight for a test or procedure, ask your physician for instructions on how to reduce/decrease your insulin  dose.    Call if blood sugar is less than 70  or consistently above 250  -Treating a low sugar by rule of 15  (15 gms of sugar every 15 min until sugar is more than 70) If you feel your sugar is low, test your sugar to be sure If your sugar is low (less than 70), then take 15 grams of a fast acting Carbohydrate (3-4 glucose tablets or glucose gel or 4 ounces of juice or regular soda) Recheck your sugar 15 min after treating low to make sure it is more than 70 If sugar is still less than 70, treat again with 15 grams of carbohydrate          Don't drive the hour of hypoglycemia  If unconscious/unable to eat or drink by mouth, use glucagon  injection or nasal spray baqsimi  and call 911. Can repeat again in 15 min if still unconscious.  Return in about 5 weeks (around 12/20/2024).   I have reviewed current medications, nurse's notes, allergies, vital signs, past medical and surgical history, family medical history, and social history for this encounter. Counseled patient on symptoms, examination findings, lab findings, imaging results, treatment decisions and monitoring and prognosis. The patient understood the recommendations and agrees with the treatment plan. All questions regarding treatment plan were fully answered.  Michelle Birmingham, MD  11/15/24    History of Present Illness Michelle Gibson is a 49 y.o. year old female who presents for evaluation of Type II diabetes mellitus.  Michelle Gibson was first diagnosed in 75s.   Diabetes education +  Home diabetes regimen: Lantus  50 units qam Humalog  20 units 15 min before meals Ozempic  0.25 mg/week-no S/E Metformin  500mg  bid  COMPLICATIONS -  MI/Stroke +  retinopathy +  neuropathy -  nephropathy  11/24/2023 Mg 1.8 TG 402 CHOL 298 No LDL A1c 10.5 GFR 7 6 AST 2 3 ALT 23  BLOOD SUGAR DATA CGM interpretation: At today's visit, we reviewed her CGM downloads. The full report is scanned in the media. Reviewing the CGM trends, BG are elevated across the day (22% in  target).  Physical Exam  BP (!) 140/90   Pulse 87   Ht 5' 6 (1.676 m)   Wt 186 lb (84.4 kg)   SpO2 95%   BMI 30.02 kg/m    Constitutional: well developed, well nourished Head: normocephalic, atraumatic Eyes: sclera anicteric, no redness Neck: supple Lungs: normal respiratory effort Neurology: alert and oriented Skin: dry, no appreciable rashes Musculoskeletal: no appreciable defects Psychiatric: normal mood and affect Diabetic Foot Exam - Simple   No data filed      Current Medications Patient's Medications  New Prescriptions   No medications on file  Previous Medications   ACCU-CHEK SOFTCLIX LANCETS LANCETS    Use 4 (four) times daily -  before meals and at bedtime.   BENZONATATE  (TESSALON ) 100 MG CAPSULE    Take 1 capsule by mouth every 8 (eight) hours.   BLOOD GLUCOSE MONITORING SUPPL (ACCU-CHEK GUIDE ME) W/DEVICE KIT    Use as directed 4 (  four) times daily -  before meals and at bedtime.   BLOOD GLUCOSE MONITORING SUPPL (ONETOUCH VERIO) W/DEVICE KIT    Use to check blood sugar TID.   BLOOD GLUCOSE MONITORING SUPPL DEVI    1 each by Does not apply route in the morning, at noon, and at bedtime. May substitute to any manufacturer covered by patient's insurance.   BUPRENORPHINE (BUTRANS) 10 MCG/HR PTWK    APPLY 1 PATCH TO THE SKIN ONCE A WEEK   BUPRENORPHINE (BUTRANS) 15 MCG/HR    1 patch once a week.   CONTINUOUS BLOOD GLUC TRANSMIT (DEXCOM G6 TRANSMITTER) MISC    1 each by Does not apply route 4 (four) times daily -  before meals and at bedtime.   CONTINUOUS GLUCOSE SENSOR (DEXCOM G7 15 DAY SENSOR) MISC    Use as directed   CONTINUOUS GLUCOSE SENSOR (DEXCOM G7 SENSOR) MISC    USE AS DIRECTED   CONTINUOUS GLUCOSE SENSOR (DEXCOM G7 SENSOR) MISC    1 Device by Does not apply route continuous.   FLUTICASONE  (FLONASE ) 50 MCG/ACT NASAL SPRAY    Place 1 spray into both nostrils daily.   GABAPENTIN  (NEURONTIN ) 100 MG CAPSULE       GABAPENTIN  (NEURONTIN ) 300 MG CAPSULE    Take 2  capsules (600 mg total) by mouth 4 (four) times daily.   GABAPENTIN  (NEURONTIN ) 600 MG TABLET    Take 600 mg by mouth 3 (three) times daily.   GLUCAGON  (BAQSIMI  ONE PACK) 3 MG/DOSE POWD    Place 1 Device into the nose as needed (Low blood sugar with impaired consciousness).   GLUCOSE BLOOD (BLOOD GLUCOSE TEST STRIPS) STRP    Test in the morning, at noon, and at bedtime.   GLUCOSE BLOOD (TRUE METRIX BLOOD GLUCOSE TEST) TEST STRIP    Use 4 (four) times daily -  before meals and at bedtime. Use as instructed   HYDROCODONE -ACETAMINOPHEN  (NORCO) 10-325 MG TABLET    Take 1 tablet by mouth 4 (four) times daily as needed.   IBUPROFEN  (ADVIL ) 800 MG TABLET    TAKE 1 TABLET (800 MG TOTAL) BY MOUTH 3 (THREE) TIMES DAILY. X 7 DAYS THEN AS NEEDED FOR PAIN EAT PRIOR TO TAKING THIS MEDICATION   INSULIN  PEN NEEDLE 32G X 4 MM MISC    Use a total of 4 pen needles a day.   LANCET DEVICE MISC    1 each by Does not apply route in the morning, at noon, and at bedtime. May substitute to any manufacturer covered by patient's insurance.   LANCETS MISC    Use up to four times daily as directed. (FOR ICD-10 E10.9, E11.9).   METFORMIN  (GLUCOPHAGE ) 1000 MG TABLET    Take 1 tablet (1,000 mg total) by mouth 2 (two) times daily with a meal.   METFORMIN  (GLUCOPHAGE ) 500 MG TABLET       NALOXONE (NARCAN) NASAL SPRAY 4 MG/0.1 ML    SMARTSIG:Both Nares   OMEPRAZOLE (PRILOSEC) 40 MG CAPSULE    Take 40 mg by mouth daily.   ONDANSETRON  (ZOFRAN -ODT) 4 MG DISINTEGRATING TABLET    Place 1 tab under the tongue every 4 hours as needed for nausea and vomitting   ONETOUCH DELICA LANCETS 33G MISC    Use to check blood sugar TID.   PROPAFENONE (RYTHMOL SR) 225 MG 12 HR CAPSULE       PROPAFENONE (RYTHMOL) 150 MG TABLET    Take 150 mg by mouth 3 (three) times daily.   ROSUVASTATIN  (  CRESTOR ) 40 MG TABLET    Take 1 tablet (40 mg total) by mouth daily.   TAMSULOSIN  (FLOMAX ) 0.4 MG CAPS CAPSULE    Take 1 capsule (0.4 mg total) by mouth daily.    TIZANIDINE (ZANAFLEX) 2 MG TABLET    Take 2 mg by mouth 3 (three) times daily as needed.   TIZANIDINE (ZANAFLEX) 4 MG TABLET    Take 4 mg by mouth 3 (three) times daily.   TOPIRAMATE  (TOPAMAX ) 50 MG TABLET    Take 1 tablet (50 mg total) by mouth 2 (two) times daily.  Modified Medications   Modified Medication Previous Medication   INSULIN  GLARGINE (LANTUS ) 100 UNIT/ML SOLOSTAR PEN insulin  glargine (LANTUS ) 100 UNIT/ML Solostar Pen      Inject 60 Units into the skin daily.    Inject 34 units into the skin once daily. After 1 week, if fasting blood sugar > 200, increase to 40 units in the morning.   INSULIN  LISPRO (HUMALOG ) 100 UNIT/ML KWIKPEN insulin  lispro (HUMALOG ) 100 UNIT/ML KwikPen      Inject 20-25 Units into the skin 3 (three) times daily. If blood sugar before you eat is under 70, do not take it.    Inject 10 Units into the skin 3 (three) times daily. If blood sugar before you eat is under 150, do not take.   SEMAGLUTIDE ,0.25 OR 0.5MG /DOS, (OZEMPIC , 0.25 OR 0.5 MG/DOSE,) 2 MG/3ML SOPN Semaglutide ,0.25 or 0.5MG /DOS, (OZEMPIC , 0.25 OR 0.5 MG/DOSE,) 2 MG/3ML SOPN      Inject 0.5 mg into the skin once a week.    Inject 0.25 mg into the skin once a week.  Discontinued Medications   No medications on file    Allergies Allergies  Allergen Reactions   Penicillamine Dermatitis   Ciprofloxacin Hives and Dermatitis   Lyrica  Cr [Pregabalin  Er] Other (See Comments)    Suicidal ideation   Doxycycline  Rash    Yeast infection   Flecainide Hives, Nausea And Vomiting, Rash, Dermatitis and Nausea Only   Latex Other (See Comments), Rash, Dermatitis and Itching    Causes blisters, also   Penicillin G Rash    Has patient had a PCN reaction causing immediate rash, facial/tongue/throat swelling, SOB or lightheadedness with hypotension: yes Has patient had a PCN reaction causing severe rash involving mucus membranes or skin necrosis: yes Has patient had a PCN reaction that required hospitalization:  no Has patient had a PCN reaction occurring within the last 10 years: no If all of the above answers are NO, then may proceed with Cephalosporin use.    Penicillins Dermatitis    Has patient had a PCN reaction causing immediate rash, facial/tongue/throat swelling, SOB or lightheadedness with hypotension: yes Has patient had a PCN reaction causing severe rash involving mucus membranes or skin necrosis: yes Has patient had a PCN reaction that required hospitalization: no Has patient had a PCN reaction occurring within the last 10 years: no If all of the above answers are NO, then may proceed with Cephalosporin use.    Past Medical History Past Medical History:  Diagnosis Date   Acute tear lateral meniscus, right, initial encounter 08/29/2019   Asthma    Closed fracture of upper end of fibula 09/21/2018   Diabetes mellitus without complication (HCC)    TYPE II   Hyperglycemia 09/15/2016   Migraines    WPW (Wolff-Parkinson-White syndrome)    s/p ablation at age 72    Past Surgical History Past Surgical History:  Procedure Laterality Date   ABDOMINAL  HYSTERECTOMY     CARDIAC SURGERY     CESAREAN SECTION     KNEE ARTHROSCOPY WITH LATERAL MENISECTOMY Right 08/29/2019   Procedure: PARTIAL LATERAL MENISECTOMY;  Surgeon: Jerri Kay HERO, MD;  Location: [facility];  Service: Orthopedics;  Laterality: Right;   KNEE ARTHROSCOPY WITH MEDIAL MENISECTOMY Right 08/29/2019   Procedure: RIGHT KNEE ARTHROSCOPY WITH PARTIAL MEDIAL MENISECTOMY;  Surgeon: Jerri Kay HERO, MD;  Location: [facility];  Service: Orthopedics;  Laterality: Right;    Family History family history includes CAD (age of onset: 36) in her father and mother; Diabetes in her mother; Hypertension in her father and mother.  Social History Social History   Socioeconomic History   Marital status: Divorced    Spouse name: Not on file   Number of children: Not on file   Years of education: Not on file    Highest education level: 8th grade  Occupational History   Not on file  Tobacco Use   Smoking status: Some Days    Current packs/day: 0.10    Types: Cigarettes    Passive exposure: Current   Smokeless tobacco: Former  Building Services Engineer status: Some Days  Substance and Sexual Activity   Alcohol  use: No   Drug use: No   Sexual activity: Not Currently    Birth control/protection: Surgical  Other Topics Concern   Not on file  Social History Narrative   Right Handed    Lives in a mobile home. Lives with daughter    Social Drivers of Health   Tobacco Use: High Risk (11/15/2024)   Patient History    Smoking Tobacco Use: Some Days    Smokeless Tobacco Use: Former    Passive Exposure: Current  Programmer, Applications: High Risk (02/15/2023)   Overall Financial Resource Strain (CARDIA)    Difficulty of Paying Living Expenses: Very hard  Food Insecurity: Food Insecurity Present (01/27/2024)   Hunger Vital Sign    Worried About Running Out of Food in the Last Year: Sometimes true    Ran Out of Food in the Last Year: Sometimes true  Transportation Needs: Unmet Transportation Needs (02/15/2023)   PRAPARE - Transportation    Lack of Transportation (Medical): Yes    Lack of Transportation (Non-Medical): Yes  Physical Activity: Sufficiently Active (02/15/2023)   Exercise Vital Sign    Days of Exercise per Week: 5 days    Minutes of Exercise per Session: 30 min  Stress: Stress Concern Present (02/15/2023)   [employer] of Occupational Health - Occupational Stress Questionnaire    Feeling of Stress : Very much  Social Connections: Socially Isolated (02/15/2023)   Social Connection and Isolation Panel    Frequency of Communication with Friends and Family: Never    Frequency of Social Gatherings with Friends and Family: Never    Attends Religious Services: Never    Database Administrator or Organizations: No    Attends Engineer, Structural: Not on file    Marital Status:  Divorced  Intimate Partner Violence: Not At Risk (02/15/2023)   Humiliation, Afraid, Rape, and Kick questionnaire    Fear of Current or Ex-Partner: No    Emotionally Abused: No    Physically Abused: No    Sexually Abused: No  Depression (PHQ2-9): Low Risk (01/27/2024)   Depression (PHQ2-9)    PHQ-2 Score: 0  Alcohol  Screen: Low Risk (02/15/2023)   Alcohol  Screen    Last Alcohol  Screening Score (AUDIT): 0  Housing: High Risk (02/15/2023)   Housing    Last Housing Risk Score: 2  Utilities: Not At Risk (02/15/2023)   AHC Utilities    Threatened with loss of utilities: No  Health Literacy: Not on file    Lab Results  Component Value Date   HGBA1C 11.0 (A) 10/25/2024   HGBA1C 8.0 (A) 12/29/2022   HGBA1C 8.5 (A) 08/18/2021   Lab Results  Component Value Date   CHOL 355 (H) 10/25/2024   Lab Results  Component Value Date   HDL 41 (L) 10/25/2024   Lab Results  Component Value Date   LDLCALC 258 (H) 10/25/2024   Lab Results  Component Value Date   TRIG 331 (H) 10/25/2024   Lab Results  Component Value Date   CHOLHDL 8.7 (H) 10/25/2024   Lab Results  Component Value Date   CREATININE 0.80 09/28/2024   No results found for: GFR Lab Results  Component Value Date   MICROALBUR 1.4 10/25/2024      Component Value Date/Time   NA 136 09/28/2024 0443   NA 141 08/18/2021 1417   K 3.9 09/28/2024 0443   CL 99 09/28/2024 0443   CO2 25 09/28/2024 0443   GLUCOSE 331 (H) 09/28/2024 0443   BUN 18 09/28/2024 0443   BUN 18 08/18/2021 1417   CREATININE 0.80 09/28/2024 0443   CREATININE 1.00 10/07/2016 1506   CALCIUM  9.5 09/28/2024 0443   PROT 7.5 09/05/2024 1230   PROT 8.0 08/18/2021 1417   ALBUMIN 4.3 09/05/2024 1230   ALBUMIN 5.0 (H) 08/18/2021 1417   AST 23 09/05/2024 1230   ALT 14 09/05/2024 1230   ALKPHOS 82 09/05/2024 1230   BILITOT 0.3 09/05/2024 1230   BILITOT <0.2 08/18/2021 1417   GFRNONAA >60 09/28/2024 0443   GFRAA >60 07/12/2020 1046      Latest Ref Rng &  Units 09/28/2024    4:43 AM 09/05/2024   12:30 PM 08/30/2024   11:46 PM  BMP  Glucose 70 - 99 mg/dL 668  795  814   BUN 6 - 20 mg/dL 18  15  19    Creatinine 0.44 - 1.00 mg/dL 9.19  9.22  8.93   Sodium 135 - 145 mmol/L 136  141  141   Potassium 3.5 - 5.1 mmol/L 3.9  3.2  3.9   Chloride 98 - 111 mmol/L 99  104  102   CO2 22 - 32 mmol/L 25  26  25    Calcium  8.9 - 10.3 mg/dL 9.5  9.2  89.8        Component Value Date/Time   WBC 6.7 09/28/2024 0443   RBC 4.46 09/28/2024 0443   HGB 13.6 09/28/2024 0443   HGB 14.7 08/18/2021 1417   HCT 40.0 09/28/2024 0443   HCT 44.1 08/18/2021 1417   PLT 306 09/28/2024 0443   PLT 430 08/18/2021 1417   MCV 89.7 09/28/2024 0443   MCV 87 08/18/2021 1417   MCH 30.5 09/28/2024 0443   MCHC 34.0 09/28/2024 0443   RDW 12.7 09/28/2024 0443   RDW 13.1 08/18/2021 1417   LYMPHSABS 2.6 09/28/2024 0443   MONOABS 0.4 09/28/2024 0443   EOSABS 0.2 09/28/2024 0443   BASOSABS 0.0 09/28/2024 0443     Parts of this note may have been dictated using voice recognition software. There may be variances in spelling and vocabulary which are unintentional. Not all errors are proofread. Please notify the dino if any discrepancies are noted or if the meaning of any statement  is not clear.   "

## 2024-11-15 NOTE — Patient Instructions (Signed)

## 2024-11-19 ENCOUNTER — Ambulatory Visit: Admitting: "Endocrinology

## 2024-12-20 ENCOUNTER — Ambulatory Visit: Admitting: "Endocrinology
